# Patient Record
Sex: Female | Born: 1955 | Race: White | Hispanic: No | Smoking: Former smoker
Health system: Southern US, Community
[De-identification: ages and names within clinical notes are randomized; demographics above are authoritative.]

## PROBLEM LIST (undated history)

## (undated) DIAGNOSIS — M797 Fibromyalgia: Secondary | ICD-10-CM

## (undated) DIAGNOSIS — K219 Gastro-esophageal reflux disease without esophagitis: Secondary | ICD-10-CM

## (undated) DIAGNOSIS — M1990 Unspecified osteoarthritis, unspecified site: Secondary | ICD-10-CM

## (undated) DIAGNOSIS — I671 Cerebral aneurysm, nonruptured: Secondary | ICD-10-CM

## (undated) DIAGNOSIS — F411 Generalized anxiety disorder: Secondary | ICD-10-CM

## (undated) DIAGNOSIS — F329 Major depressive disorder, single episode, unspecified: Secondary | ICD-10-CM

## (undated) DIAGNOSIS — E1122 Type 2 diabetes mellitus with diabetic chronic kidney disease: Secondary | ICD-10-CM

## (undated) DIAGNOSIS — I10 Essential (primary) hypertension: Secondary | ICD-10-CM

## (undated) DIAGNOSIS — F32A Depression, unspecified: Secondary | ICD-10-CM

## (undated) DIAGNOSIS — Z86010 Personal history of colonic polyps: Secondary | ICD-10-CM

## (undated) DIAGNOSIS — H548 Legal blindness, as defined in USA: Secondary | ICD-10-CM

## (undated) DIAGNOSIS — G43909 Migraine, unspecified, not intractable, without status migrainosus: Secondary | ICD-10-CM

## (undated) DIAGNOSIS — I471 Supraventricular tachycardia: Secondary | ICD-10-CM

## (undated) DIAGNOSIS — I4719 Other supraventricular tachycardia: Secondary | ICD-10-CM

## (undated) DIAGNOSIS — R011 Cardiac murmur, unspecified: Secondary | ICD-10-CM

## (undated) DIAGNOSIS — K589 Irritable bowel syndrome without diarrhea: Secondary | ICD-10-CM

## (undated) DIAGNOSIS — J45909 Unspecified asthma, uncomplicated: Secondary | ICD-10-CM

## (undated) DIAGNOSIS — E119 Type 2 diabetes mellitus without complications: Secondary | ICD-10-CM

## (undated) DIAGNOSIS — R430 Anosmia: Secondary | ICD-10-CM

## (undated) DIAGNOSIS — Z87891 Personal history of nicotine dependence: Secondary | ICD-10-CM

## (undated) DIAGNOSIS — N189 Chronic kidney disease, unspecified: Secondary | ICD-10-CM

## (undated) DIAGNOSIS — F419 Anxiety disorder, unspecified: Secondary | ICD-10-CM

## (undated) DIAGNOSIS — Z8619 Personal history of other infectious and parasitic diseases: Secondary | ICD-10-CM

## (undated) HISTORY — DX: Cerebral aneurysm, nonruptured: I67.1

## (undated) HISTORY — DX: Type 2 diabetes mellitus without complications: E11.9

## (undated) HISTORY — DX: Supraventricular tachycardia: I47.1

## (undated) HISTORY — DX: Chronic kidney disease, unspecified: N18.9

## (undated) HISTORY — DX: Personal history of colonic polyps: Z86.010

## (undated) HISTORY — DX: Anosmia: R43.0

## (undated) HISTORY — DX: Gastro-esophageal reflux disease without esophagitis: K21.9

## (undated) HISTORY — DX: Irritable bowel syndrome without diarrhea: K58.9

## (undated) HISTORY — DX: Personal history of other infectious and parasitic diseases: Z86.19

## (undated) HISTORY — DX: Legal blindness, as defined in USA: H54.8

## (undated) HISTORY — PX: CRANIOTOMY: SHX93

## (undated) HISTORY — DX: Anxiety disorder, unspecified: F41.9

## (undated) HISTORY — PX: EYE SURGERY: SHX253

## (undated) HISTORY — DX: Fibromyalgia: M79.7

## (undated) HISTORY — PX: TUBAL LIGATION: SHX77

## (undated) HISTORY — DX: Unspecified asthma, uncomplicated: J45.909

## (undated) HISTORY — DX: Migraine, unspecified, not intractable, without status migrainosus: G43.909

## (undated) HISTORY — DX: Major depressive disorder, single episode, unspecified: F32.9

## (undated) HISTORY — DX: Essential (primary) hypertension: I10

## (undated) HISTORY — PX: MENISCUS REPAIR: SHX5179

## (undated) HISTORY — DX: Personal history of nicotine dependence: Z87.891

## (undated) HISTORY — DX: Unspecified osteoarthritis, unspecified site: M19.90

## (undated) HISTORY — DX: Other supraventricular tachycardia: I47.19

## (undated) HISTORY — DX: Cardiac murmur, unspecified: R01.1

## (undated) HISTORY — DX: Generalized anxiety disorder: F41.1

## (undated) HISTORY — DX: Depression, unspecified: F32.A

---
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
## 2002-11-19 DIAGNOSIS — I6782 Cerebral ischemia: Secondary | ICD-10-CM | POA: Insufficient documentation

## 2002-11-19 HISTORY — DX: Cerebral ischemia: I67.82

## 2002-11-19 HISTORY — PX: CEREBRAL ANEURYSM REPAIR: SHX164

## 2003-09-01 DIAGNOSIS — H548 Legal blindness, as defined in USA: Secondary | ICD-10-CM | POA: Insufficient documentation

## 2010-11-19 HISTORY — PX: ABDOMINAL HYSTERECTOMY: SHX81

## 2013-11-19 HISTORY — PX: CHOLECYSTECTOMY: SHX55

## 2018-06-12 ENCOUNTER — Encounter: Payer: Self-pay | Admitting: Family Medicine

## 2018-07-18 ENCOUNTER — Other Ambulatory Visit (INDEPENDENT_AMBULATORY_CARE_PROVIDER_SITE_OTHER): Payer: Medicare PPO

## 2018-07-18 ENCOUNTER — Encounter: Payer: Self-pay | Admitting: Nurse Practitioner

## 2018-07-18 ENCOUNTER — Ambulatory Visit (INDEPENDENT_AMBULATORY_CARE_PROVIDER_SITE_OTHER): Payer: Medicare PPO | Admitting: Nurse Practitioner

## 2018-07-18 VITALS — BP 138/78 | HR 74 | Ht 62.0 in | Wt 183.0 lb

## 2018-07-18 DIAGNOSIS — E1169 Type 2 diabetes mellitus with other specified complication: Secondary | ICD-10-CM

## 2018-07-18 DIAGNOSIS — E7800 Pure hypercholesterolemia, unspecified: Secondary | ICD-10-CM

## 2018-07-18 DIAGNOSIS — I671 Cerebral aneurysm, nonruptured: Secondary | ICD-10-CM

## 2018-07-18 DIAGNOSIS — Z23 Encounter for immunization: Secondary | ICD-10-CM | POA: Diagnosis not present

## 2018-07-18 DIAGNOSIS — G4733 Obstructive sleep apnea (adult) (pediatric): Secondary | ICD-10-CM | POA: Insufficient documentation

## 2018-07-18 DIAGNOSIS — G4731 Primary central sleep apnea: Secondary | ICD-10-CM | POA: Insufficient documentation

## 2018-07-18 DIAGNOSIS — A0472 Enterocolitis due to Clostridium difficile, not specified as recurrent: Secondary | ICD-10-CM

## 2018-07-18 DIAGNOSIS — E1142 Type 2 diabetes mellitus with diabetic polyneuropathy: Secondary | ICD-10-CM

## 2018-07-18 DIAGNOSIS — E118 Type 2 diabetes mellitus with unspecified complications: Secondary | ICD-10-CM | POA: Diagnosis not present

## 2018-07-18 DIAGNOSIS — K589 Irritable bowel syndrome without diarrhea: Secondary | ICD-10-CM | POA: Insufficient documentation

## 2018-07-18 DIAGNOSIS — F329 Major depressive disorder, single episode, unspecified: Secondary | ICD-10-CM

## 2018-07-18 DIAGNOSIS — F32A Depression, unspecified: Secondary | ICD-10-CM

## 2018-07-18 DIAGNOSIS — N289 Disorder of kidney and ureter, unspecified: Secondary | ICD-10-CM | POA: Diagnosis not present

## 2018-07-18 DIAGNOSIS — I10 Essential (primary) hypertension: Secondary | ICD-10-CM

## 2018-07-18 DIAGNOSIS — I152 Hypertension secondary to endocrine disorders: Secondary | ICD-10-CM | POA: Insufficient documentation

## 2018-07-18 DIAGNOSIS — E1159 Type 2 diabetes mellitus with other circulatory complications: Secondary | ICD-10-CM | POA: Insufficient documentation

## 2018-07-18 DIAGNOSIS — K219 Gastro-esophageal reflux disease without esophagitis: Secondary | ICD-10-CM | POA: Diagnosis not present

## 2018-07-18 DIAGNOSIS — M797 Fibromyalgia: Secondary | ICD-10-CM

## 2018-07-18 DIAGNOSIS — I341 Nonrheumatic mitral (valve) prolapse: Secondary | ICD-10-CM | POA: Insufficient documentation

## 2018-07-18 DIAGNOSIS — F39 Unspecified mood [affective] disorder: Secondary | ICD-10-CM | POA: Insufficient documentation

## 2018-07-18 DIAGNOSIS — F419 Anxiety disorder, unspecified: Secondary | ICD-10-CM

## 2018-07-18 DIAGNOSIS — Z794 Long term (current) use of insulin: Secondary | ICD-10-CM | POA: Insufficient documentation

## 2018-07-18 HISTORY — DX: Obstructive sleep apnea (adult) (pediatric): G47.33

## 2018-07-18 HISTORY — DX: Type 2 diabetes mellitus with other specified complication: E11.69

## 2018-07-18 HISTORY — DX: Enterocolitis due to Clostridium difficile, not specified as recurrent: A04.72

## 2018-07-18 HISTORY — DX: Type 2 diabetes mellitus with other circulatory complications: E11.59

## 2018-07-18 HISTORY — DX: Long term (current) use of insulin: E11.42

## 2018-07-18 HISTORY — DX: Hypertension secondary to endocrine disorders: I15.2

## 2018-07-18 HISTORY — DX: Cerebral aneurysm, nonruptured: I67.1

## 2018-07-18 LAB — CBC
HEMATOCRIT: 42.3 % (ref 36.0–46.0)
Hemoglobin: 14.5 g/dL (ref 12.0–15.0)
MCHC: 34.2 g/dL (ref 30.0–36.0)
MCV: 83.1 fl (ref 78.0–100.0)
Platelets: 204 10*3/uL (ref 150.0–400.0)
RBC: 5.09 Mil/uL (ref 3.87–5.11)
RDW: 15.1 % (ref 11.5–15.5)
WBC: 7.4 10*3/uL (ref 4.0–10.5)

## 2018-07-18 LAB — LIPID PANEL
CHOLESTEROL: 141 mg/dL (ref 0–200)
HDL: 41.6 mg/dL (ref >39.00)
LDL CALC: 61 mg/dL (ref 0–99)
NonHDL: 99.08
Total CHOL/HDL Ratio: 3
Triglycerides: 191 mg/dL — ABNORMAL HIGH (ref 0.0–149.0)
VLDL: 38.2 mg/dL (ref 0.0–40.0)

## 2018-07-18 LAB — COMPREHENSIVE METABOLIC PANEL
ALBUMIN: 4.7 g/dL (ref 3.5–5.2)
ALT: 45 U/L — ABNORMAL HIGH (ref 0–35)
AST: 29 U/L (ref 0–37)
Alkaline Phosphatase: 103 U/L (ref 39–117)
BUN: 20 mg/dL (ref 6–23)
CO2: 27 mEq/L (ref 19–32)
Calcium: 10.1 mg/dL (ref 8.4–10.5)
Chloride: 101 mEq/L (ref 96–112)
Creatinine, Ser: 1.3 mg/dL — ABNORMAL HIGH (ref 0.40–1.20)
GFR: 44.28 mL/min — ABNORMAL LOW (ref >60.00)
GLUCOSE: 299 mg/dL — AB (ref 70–99)
POTASSIUM: 3.7 meq/L (ref 3.5–5.1)
SODIUM: 139 meq/L (ref 135–145)
Total Bilirubin: 0.8 mg/dL (ref 0.2–1.2)
Total Protein: 7.9 g/dL (ref 6.0–8.3)

## 2018-07-18 LAB — MAGNESIUM: Magnesium: 1.7 mg/dL (ref 1.5–2.5)

## 2018-07-18 LAB — HEMOGLOBIN A1C: Hgb A1c MFr Bld: 8.5 % — ABNORMAL HIGH (ref 4.6–6.5)

## 2018-07-18 NOTE — Assessment & Plan Note (Signed)
She says she was treated for FM by prior neurologist, I have placed referral to neurology today for F/U of brain aneursym as well - Ambulatory referral to Neurology

## 2018-07-18 NOTE — Assessment & Plan Note (Signed)
Continue current meds Update labs F/U with further recommendations pending lab results - Comprehensive metabolic panel; Future - Hemoglobin A1c; Future

## 2018-07-18 NOTE — Assessment & Plan Note (Signed)
Stable Continue current meds Update labs - CBC; Future - Comprehensive metabolic panel; Future - Lipid panel; Future

## 2018-07-18 NOTE — Assessment & Plan Note (Signed)
>>  ASSESSMENT AND PLAN FOR FIBROMYALGIA WRITTEN ON 07/18/2018  9:48 AM BY SHAMBLEY, ASHLEIGH N, NP  She says she was treated for FM by prior neurologist, I have placed referral to neurology today for F/U of brain aneursym as well - Ambulatory referral to Neurology

## 2018-07-18 NOTE — Assessment & Plan Note (Signed)
Stable Continue pantoprazole Update labs - Magnesium; Future

## 2018-07-18 NOTE — Assessment & Plan Note (Addendum)
We discussed long term risk of benzodiazepine use including addiction, memory loss, falls and she would like to continue ativan PRN We did discuss that SSRI would be more appropriate as a daily treatment,she has not wanted to try SSRI up to this point Continue ativan PRN only, no more than 2-3 times per week as she is currently taking F/U for new or worsening symptoms

## 2018-07-18 NOTE — Patient Instructions (Addendum)
Please head downstairs for lab work. If any of your test results are critically abnormal, you will be contacted right away. Otherwise, I will contact you within a week about your test results and follow up recommendations  I will plan to see you back in about 6 months for routine follow up.  It was nice to meet you. Thanks for letting me take care of you.

## 2018-07-18 NOTE — Assessment & Plan Note (Signed)
Continue atorvastatin Update labs - CBC; Future - Comprehensive metabolic panel; Future - Lipid panel; Future

## 2018-07-18 NOTE — Assessment & Plan Note (Signed)
Referral to neurology for routine follow up - Ambulatory referral to Neurology

## 2018-07-18 NOTE — Progress Notes (Signed)
Name: Cassandra Reid   MRN: [id number]    DOB: 1956-07-15   Date:07/18/2018       Progress Note  Subjective  Chief Complaint Establish care  HPI Ms Cassandra Reid is here today to establish care as a new patient to our practice, just moving to [place] from [place] to be closer to children, very pleasant 62 yo. Aside from primary care, she has routinely followed with Cardiology for mitral valve prolapse and hypertension, Gastroenterology- GERD, IBS, Pulmonology-OSA, Endocrinology-Diabetes mellitus, Neurology- brain aneurysm and fibromyalgia. And nephrology for decreased renal function in [place]. She tells me that she would prefer not to return to so many specialists here as she feels overall okay, but would like to re-establish with neurology for routine follow up of brain aneurysm with coiling and nephrology for decreased kidney function, as she was told her kidney function dropped dramatically in the past year. She does c/o chronic body aches and pain, which she has been told are related to her fibromyalgia diagnosis. She is not on medication for this, and hesitates to take any new medications that are not neccessary. We will review her daily medications during the visit today and update labs.  Hypertension -maintained on amlodipine 10, metoprolol tartrate 25 BID, Lasix 40 daily. She is also on daily Potassium 20mEq for hypokalemia in the past. Reports daily medication compliance without noted adverse medication effects. Reports she occasionally checks BP readings at home with normal readings No syncope, dizziness, shortness of breath, edema.  BP Readings from Last 3 Encounters:  07/18/18 138/78   Cholesterol- maintained on atorvastatin 80 daily Reports daily medication compliance without noted adverse medication effects.  No results found for: CHOL, HDL, LDLCALC, LDLDIRECT, TRIG, CHOLHDL  Diabetes- maintained on glipizide 5 daily, metformin 500 BID, novolin 70/30-50 units BID Reports daily medication  compliance without noted adverse medication effects. Reports she checks blood sugars daily, no episodes of hypoglycemia or readings <100, some occasional readings up to 200. No tremor, diaphoresis, polyuria, polydipsia, polyphagia.  No results found for: HGBA1C  Anxiety- maintained ativan 0.5 prn 2-3 times per week for some time now, which she takes only when she feels acutely anxious such as social situations, car rides. She was Recently prescribed zoloft 50 but has not started it yet, she is thinking of starting it soon  GERD- maintained on protonix 20 BID with good control of GERD, but does notice symptoms of heartburn, acid reflux if she misses a dose.   Past Surgical History:  Procedure Laterality Date  . ABDOMINAL HYSTERECTOMY  2012  . APPENDECTOMY  1972  . CEREBRAL ANEURYSM REPAIR    . CHOLECYSTECTOMY  2015  . MENISCUS REPAIR Bilateral     History reviewed. No pertinent family history.  Social History   Socioeconomic History  . Marital status: Married    Spouse name: Not on file  . Number of children: Not on file  . Years of education: Not on file  . Highest education level: Not on file  Occupational History  . Not on file  Social Needs  . Financial resource strain: Not on file  . Food insecurity:    Worry: Not on file    Inability: Not on file  . Transportation needs:    Medical: Not on file    Non-medical: Not on file  Tobacco Use  . Smoking status: Former Games developermoker  . Smokeless tobacco: Never Used  Substance and Sexual Activity  . Alcohol use: Never    Frequency: Never  .  Drug use: Never  . Sexual activity: Not on file  Lifestyle  . Physical activity:    Days per week: Not on file    Minutes per session: Not on file  . Stress: Not on file  Relationships  . Social connections:    Talks on phone: Not on file    Gets together: Not on file    Attends religious service: Not on file    Active member of club or organization: Not on file    Attends meetings  of clubs or organizations: Not on file    Relationship status: Not on file  . Intimate partner violence:    Fear of current or ex partner: Not on file    Emotionally abused: Not on file    Physically abused: Not on file    Forced sexual activity: Not on file  Other Topics Concern  . Not on file  Social History Narrative  . Not on file     Current Outpatient Medications:  .  ACCU-CHEK FASTCLIX LANCETS MISC, , Disp: , Rfl:  .  ACCU-CHEK GUIDE test strip, , Disp: , Rfl:  .  amLODipine (NORVASC) 10 MG tablet, daily., Disp: , Rfl:  .  atorvastatin (LIPITOR) 80 MG tablet, daily., Disp: , Rfl:  .  furosemide (LASIX) 40 MG tablet, daily., Disp: , Rfl:  .  glipiZIDE (GLUCOTROL XL) 5 MG 24 hr tablet, daily., Disp: , Rfl:  .  LORazepam (ATIVAN) 0.5 MG tablet, as needed., Disp: , Rfl:  .  metFORMIN (GLUCOPHAGE-XR) 500 MG 24 hr tablet, 2 (two) times daily., Disp: , Rfl:  .  metoprolol tartrate (LOPRESSOR) 25 MG tablet, 2 (two) times daily., Disp: , Rfl:  .  pantoprazole (PROTONIX) 20 MG tablet, 2 (two) times daily., Disp: , Rfl:  .  potassium chloride SA (K-DUR,KLOR-CON) 20 MEQ tablet, daily., Disp: , Rfl:  .  sertraline (ZOLOFT) 50 MG tablet, daily., Disp: , Rfl:   No Known Allergies   ROS See HPI  Objective  Vitals:   07/18/18 0849  BP: 138/78  Pulse: 74  SpO2: 96%  Weight: 183 lb (83 kg)  Height: 5\' 2"  (1.575 m)    Body mass index is 33.47 kg/m.  Physical Exam Vital signs reviewed. Constitutional: Patient appears well-developed and well-nourished. No distress.  HENT: Head: Normocephalic and atraumatic. Nose: Nose normal. Mouth/Throat: Oropharynx is clear and moist. No oropharyngeal exudate.  Eyes: Conjunctivae and EOM are normal. Pupils are equal, round, and reactive to light. No scleral icterus.  Neck: Normal range of motion. Neck supple. No thyromegaly. No cervical adenopathy. No carotid bruit. Cardiovascular: Normal rate, regular rhythm and normal heart sounds. No BLE  edema. Distal pulses intact. Pulmonary/Chest: Effort normal and breath sounds normal. No respiratory distress. Abdominal: Soft., no distension Neurological: She is alert and oriented to person, place, and time. No cranial nerve deficit. Coordination, balance, strength, speech  are normal. Ambulatory with cane Skin: Skin is warm and dry. No rash noted. No erythema.  Psychiatric: Patient has a normal mood and affect. behavior is normal. Judgment and thought content normal.   Assessment & Plan RTC in 6 months for routine follow up of chronic conditions, can update HM if stable  Reviewed Health Maintenance: Need for influenza vaccination- Flu Vaccine QUAD 36+ mos IM  Function kidney decreased Update labs Referral placed - Comprehensive metabolic panel; Future - Ambulatory referral to Nephrology

## 2018-07-24 ENCOUNTER — Encounter: Payer: Self-pay | Admitting: Nurse Practitioner

## 2018-08-01 ENCOUNTER — Encounter: Payer: Self-pay | Admitting: Neurology

## 2018-08-07 ENCOUNTER — Other Ambulatory Visit: Payer: Self-pay | Admitting: Nurse Practitioner

## 2018-08-07 DIAGNOSIS — E118 Type 2 diabetes mellitus with unspecified complications: Secondary | ICD-10-CM

## 2018-08-07 MED ORDER — GLIPIZIDE ER 10 MG PO TB24: 10.0000 mg | ORAL_TABLET | Freq: Every day | ORAL | 3 refills | Status: DC

## 2018-08-26 ENCOUNTER — Encounter: Payer: Self-pay | Admitting: Nurse Practitioner

## 2018-08-26 ENCOUNTER — Other Ambulatory Visit: Payer: Self-pay | Admitting: *Deleted

## 2018-08-26 NOTE — Telephone Encounter (Signed)
[place] Controlled Database Checked Last filled: No history LOV w/you: 07/18/18 Next appt w/you: None

## 2018-08-27 ENCOUNTER — Other Ambulatory Visit: Payer: Self-pay | Admitting: Nurse Practitioner

## 2018-08-27 MED ORDER — LORAZEPAM 0.5 MG PO TABS: 0.5000 mg | ORAL_TABLET | ORAL | 0 refills | Status: DC | PRN

## 2018-09-02 ENCOUNTER — Encounter: Payer: Self-pay | Admitting: Family

## 2018-09-02 ENCOUNTER — Ambulatory Visit: Payer: Medicare PPO | Admitting: Family

## 2018-09-02 VITALS — BP 140/70 | HR 66 | Temp 98.4°F | Ht 62.0 in | Wt 185.0 lb

## 2018-09-02 DIAGNOSIS — M542 Cervicalgia: Secondary | ICD-10-CM | POA: Diagnosis not present

## 2018-09-02 DIAGNOSIS — J309 Allergic rhinitis, unspecified: Secondary | ICD-10-CM | POA: Diagnosis not present

## 2018-09-02 MED ORDER — BACLOFEN 10 MG PO TABS: 10.0000 mg | ORAL_TABLET | Freq: Three times a day (TID) | ORAL | 0 refills | Status: DC

## 2018-09-02 NOTE — Progress Notes (Signed)
Cassandra Reid is a 62 y.o. female with the following history as recorded in [employer]:  Patient Active Problem List   Diagnosis Date Noted  . Mitral valve prolapse 07/18/2018  . Hypertension 07/18/2018  . GERD (gastroesophageal reflux disease) 07/18/2018  . IBS (irritable bowel syndrome) 07/18/2018  . C. difficile colitis 07/18/2018  . OSA (obstructive sleep apnea) 07/18/2018  . Diabetes (HCC) 07/18/2018  . Brain aneurysm 07/18/2018  . Fibromyalgia 07/18/2018  . Anxiety and depression 07/18/2018  . Hypercholesterolemia 07/18/2018    Current Outpatient Medications  Medication Sig Dispense Refill  . ACCU-CHEK FASTCLIX LANCETS MISC     . ACCU-CHEK GUIDE test strip     . amLODipine (NORVASC) 10 MG tablet Take 10 mg by mouth daily.     Marland Kitchen atorvastatin (LIPITOR) 80 MG tablet Take 80 mg by mouth daily.     . chlorhexidine (PERIDEX) 0.12 % solution   0  . furosemide (LASIX) 40 MG tablet Take 40 mg by mouth daily.     Marland Kitchen glipiZIDE (GLUCOTROL XL) 10 MG 24 hr tablet Take 1 tablet (10 mg total) by mouth daily. 30 tablet 3  . Insulin NPH Isophane & Regular (NOVOLIN 70/30 [place]) Inject 50 Units into the skin 2 (two) times daily.    Marland Kitchen LORazepam (ATIVAN) 0.5 MG tablet Take 1 tablet (0.5 mg total) by mouth as needed. 30 tablet 0  . metFORMIN (GLUCOPHAGE-XR) 500 MG 24 hr tablet Take 500 mg by mouth 2 (two) times daily.     . metoprolol tartrate (LOPRESSOR) 25 MG tablet Take 25 mg by mouth 2 (two) times daily.     . pantoprazole (PROTONIX) 20 MG tablet Take 20 mg by mouth 2 (two) times daily.     . potassium chloride SA (K-DUR,KLOR-CON) 20 MEQ tablet Take 20 mEq by mouth daily.     . sertraline (ZOLOFT) 50 MG tablet Take 50 mg by mouth daily.     . baclofen (LIORESAL) 10 MG tablet Take 1 tablet (10 mg total) by mouth 3 (three) times daily. 30 each 0   No current facility-administered medications for this visit.     Allergies: Patient has no known allergies.  Past Medical History:  Diagnosis Date  .  Anxiety   . Arthritis   . Asthma   . Chronic kidney disease   . Depression   . Diabetes mellitus without complication (HCC)   . Fibromyalgia   . GERD (gastroesophageal reflux disease)   . Heart murmur   . History of chicken pox   . History of colon polyps   . Hypertension   . Legally blind   . Migraines     Past Surgical History:  Procedure Laterality Date  . ABDOMINAL HYSTERECTOMY  2012  . APPENDECTOMY  1972  . CEREBRAL ANEURYSM REPAIR    . CHOLECYSTECTOMY  2015  . MENISCUS REPAIR Bilateral     Family History  Problem Relation Age of Onset  . Alcohol abuse Mother   . Diabetes Mother   . Hypertension Mother   . Kidney disease Mother   . COPD Father   . Diabetes Father   . Early death Father   . Heart disease Father   . Hyperlipidemia Father   . Hypertension Father   . Diabetes Sister   . Hypertension Sister   . Breast cancer Sister   . Cancer Maternal Grandmother   . Alcohol abuse Maternal Grandfather   . Cancer Maternal Grandfather   . Cancer Paternal Grandmother   .  Cancer Paternal Grandfather   . Diabetes Sister   . Hypertension Sister   . Kidney disease Sister     Social History   Tobacco Use  . Smoking status: Former Games developer  . Smokeless tobacco: Never Used  Substance Use Topics  . Alcohol use: Never    Frequency: Never    Subjective:  Patient started with sinus congestion last week; felt like symptoms have been improving in the past few days; feels like pain is settled into her neck/ "painful to turn neck"; has tried heat with limited benefit; does have chronic allergy issues- has Nasonex at home but not using regularly;      Objective:  Vitals:   09/02/18 0933  BP: 140/70  Pulse: 66  Temp: 98.4 F (36.9 C)  TempSrc: Oral  SpO2: 97%  Weight: 185 lb (83.9 kg)  Height: 5\' 2"  (1.575 m)    General: Well developed, well nourished, in no acute distress  Skin : Warm and dry.  Head: Normocephalic and atraumatic  Eyes: Sclera and conjunctiva  clear; pupils round and reactive to light; extraocular movements intact  Ears: External normal; canals clear; tympanic membranes normal  Oropharynx: Pink, supple. No suspicious lesions  Neck: Supple without thyromegaly, adenopathy  Lungs: Respirations unlabored; clear to auscultation bilaterally without wheeze, rales, rhonchi  CVS exam: normal rate and regular rhythm.  Neurologic: Alert and oriented; speech intact; face symmetrical; moves all extremities well; CNII-XII intact without focal deficit   Assessment:  1. Allergic rhinitis, unspecified seasonality, unspecified trigger   2. Neck pain     Plan:  1. Recommend to use her Nasonex and Mucinex regularly; reassurance that do not feel she needs an antibiotic at this time; she is in agreement. 2. Suspect muscular; lymph nodes are normal; Rx for Baclofen 10 mg tid prn; follow-up worse, no better.   No follow-ups on file.  No orders of the defined types were placed in this encounter.   Requested Prescriptions   Signed Prescriptions Disp Refills  . baclofen (LIORESAL) 10 MG tablet 30 each 0    Sig: Take 1 tablet (10 mg total) by mouth 3 (three) times daily.

## 2018-09-18 ENCOUNTER — Ambulatory Visit: Payer: Medicare PPO | Admitting: Family

## 2018-09-26 ENCOUNTER — Ambulatory Visit (INDEPENDENT_AMBULATORY_CARE_PROVIDER_SITE_OTHER): Payer: Medicare PPO | Admitting: Nurse Practitioner

## 2018-09-26 ENCOUNTER — Encounter: Payer: Self-pay | Admitting: Nurse Practitioner

## 2018-09-26 ENCOUNTER — Other Ambulatory Visit (INDEPENDENT_AMBULATORY_CARE_PROVIDER_SITE_OTHER): Payer: Medicare PPO

## 2018-09-26 VITALS — BP 140/70 | HR 67 | Temp 98.1°F | Ht 62.0 in | Wt 183.0 lb

## 2018-09-26 DIAGNOSIS — J309 Allergic rhinitis, unspecified: Secondary | ICD-10-CM

## 2018-09-26 DIAGNOSIS — Z1159 Encounter for screening for other viral diseases: Secondary | ICD-10-CM | POA: Diagnosis not present

## 2018-09-26 DIAGNOSIS — E119 Type 2 diabetes mellitus without complications: Secondary | ICD-10-CM

## 2018-09-26 DIAGNOSIS — Z9189 Other specified personal risk factors, not elsewhere classified: Secondary | ICD-10-CM

## 2018-09-26 DIAGNOSIS — Z1211 Encounter for screening for malignant neoplasm of colon: Secondary | ICD-10-CM

## 2018-09-26 DIAGNOSIS — Z114 Encounter for screening for human immunodeficiency virus [HIV]: Secondary | ICD-10-CM

## 2018-09-26 DIAGNOSIS — M797 Fibromyalgia: Secondary | ICD-10-CM | POA: Diagnosis not present

## 2018-09-26 HISTORY — DX: Allergic rhinitis, unspecified: J30.9

## 2018-09-26 LAB — COMPREHENSIVE METABOLIC PANEL
ALT: 37 U/L — AB (ref 0–35)
AST: 25 U/L (ref 0–37)
Albumin: 4.8 g/dL (ref 3.5–5.2)
Alkaline Phosphatase: 109 U/L (ref 39–117)
BILIRUBIN TOTAL: 0.5 mg/dL (ref 0.2–1.2)
BUN: 15 mg/dL (ref 6–23)
CALCIUM: 9.6 mg/dL (ref 8.4–10.5)
CO2: 22 meq/L (ref 19–32)
CREATININE: 1.2 mg/dL (ref 0.40–1.20)
Chloride: 105 mEq/L (ref 96–112)
GFR: 48.53 mL/min — ABNORMAL LOW (ref >60.00)
GLUCOSE: 233 mg/dL — AB (ref 70–99)
Potassium: 3.9 mEq/L (ref 3.5–5.1)
Sodium: 140 mEq/L (ref 135–145)
Total Protein: 7.9 g/dL (ref 6.0–8.3)

## 2018-09-26 LAB — MICROALBUMIN / CREATININE URINE RATIO
CREATININE, U: 374 mg/dL
Microalb Creat Ratio: 3.5 mg/g (ref 0.0–30.0)
Microalb, Ur: 13.1 mg/dL — ABNORMAL HIGH (ref 0.0–1.9)

## 2018-09-26 LAB — HEMOGLOBIN A1C: Hgb A1c MFr Bld: 7.9 % — ABNORMAL HIGH (ref 4.6–6.5)

## 2018-09-26 NOTE — Progress Notes (Signed)
NEUROLOGY CONSULTATION NOTE  Cassandra Reid MRN: [id number] DOB: 1956-03-05  Referring provider: Alphonse Guild, NP Primary care provider: Alphonse Guild, NP  Reason for consult:  Cerebral aneurysm  HISTORY OF PRESENT ILLNESS: Cassandra Reid is a 62 year old female with chronic kidney disease, diabetes, hypertension, fibromyalgia, and history of cerebral aneurysm status post coiling who presents for evaluation of her cerebral aneurysm.  History supplemented by her referring providers note.  She had clipping and then coiling for non-ruptured aneurysm x2 about 14 years ago.  Her last image was a couple of years ago.  She reports that they saw another aneurysm but nothing that yet required intervention.    Since the surgeries, she reports spasms on top and side of head.  They occur off and on for a couple of days, occurring every 5 days.  She denies associated nausea, vomiting, photophobia, phonophobia, visual disturbance or unilateral numbness or weakness.  She was prescribed gabapentin by her prior neurologist but she never started it until she could establish care.  Prior treatment included tizanidine which was helpful but no longer covered by insurance.  She also has fibromyalgia.  She also has significant anxiety.   CMP from 09/26/18 demonstrated GFR of 48.53.  PAST MEDICAL HISTORY: Past Medical History:  Diagnosis Date  . Anxiety   . Arthritis   . Asthma   . Chronic kidney disease   . Depression   . Diabetes mellitus without complication (HCC)   . Fibromyalgia   . GERD (gastroesophageal reflux disease)   . Heart murmur   . History of chicken pox   . History of colon polyps   . Hypertension   . Legally blind   . Migraines     PAST SURGICAL HISTORY: Past Surgical History:  Procedure Laterality Date  . ABDOMINAL HYSTERECTOMY  2012  . APPENDECTOMY  1972  . CEREBRAL ANEURYSM REPAIR    . CHOLECYSTECTOMY  2015  . MENISCUS REPAIR Bilateral     MEDICATIONS: Current  Outpatient Medications on File Prior to Visit  Medication Sig Dispense Refill  . ACCU-CHEK FASTCLIX LANCETS MISC     . ACCU-CHEK GUIDE test strip     . amLODipine (NORVASC) 10 MG tablet Take 10 mg by mouth daily.     Marland Kitchen atorvastatin (LIPITOR) 80 MG tablet Take 80 mg by mouth daily.     . baclofen (LIORESAL) 10 MG tablet Take 1 tablet (10 mg total) by mouth 3 (three) times daily. 30 each 0  . cephALEXin (KEFLEX) 500 MG capsule     . chlorhexidine (PERIDEX) 0.12 % solution   0  . furosemide (LASIX) 40 MG tablet Take 40 mg by mouth daily.     Marland Kitchen gabapentin (NEURONTIN) 300 MG capsule     . glipiZIDE (GLUCOTROL XL) 10 MG 24 hr tablet Take 1 tablet (10 mg total) by mouth daily. 30 tablet 3  . Insulin NPH Isophane & Regular (NOVOLIN 70/30 [place]) Inject 50 Units into the skin 2 (two) times daily.    Marland Kitchen LORazepam (ATIVAN) 0.5 MG tablet Take 1 tablet (0.5 mg total) by mouth as needed. 30 tablet 0  . metFORMIN (GLUCOPHAGE-XR) 500 MG 24 hr tablet Take 500 mg by mouth 2 (two) times daily.     . metoprolol tartrate (LOPRESSOR) 25 MG tablet Take 25 mg by mouth 2 (two) times daily.     . pantoprazole (PROTONIX) 20 MG tablet Take 20 mg by mouth 2 (two) times daily.     . potassium  chloride SA (K-DUR,KLOR-CON) 20 MEQ tablet Take 20 mEq by mouth daily.     . sertraline (ZOLOFT) 50 MG tablet Take 50 mg by mouth daily.      No current facility-administered medications on file prior to visit.     ALLERGIES: No Known Allergies  FAMILY HISTORY: Family History  Problem Relation Age of Onset  . Alcohol abuse Mother   . Diabetes Mother   . Hypertension Mother   . Kidney disease Mother   . COPD Father   . Diabetes Father   . Early death Father   . Heart disease Father   . Hyperlipidemia Father   . Hypertension Father   . Diabetes Sister   . Hypertension Sister   . Breast cancer Sister   . Cancer Maternal Grandmother   . Alcohol abuse Maternal Grandfather   . Cancer Maternal Grandfather   . Cancer Paternal  Grandmother   . Cancer Paternal Grandfather   . Diabetes Sister   . Hypertension Sister   . Kidney disease Sister     SOCIAL HISTORY: Social History   Socioeconomic History  . Marital status: Married    Spouse name: Not on file  . Number of children: Not on file  . Years of education: Not on file  . Highest education level: Not on file  Occupational History  . Not on file  Social Needs  . Financial resource strain: Not on file  . Food insecurity:    Worry: Not on file    Inability: Not on file  . Transportation needs:    Medical: Not on file    Non-medical: Not on file  Tobacco Use  . Smoking status: Former Games developer  . Smokeless tobacco: Never Used  Substance and Sexual Activity  . Alcohol use: Never    Frequency: Never  . Drug use: Never  . Sexual activity: Not Currently  Lifestyle  . Physical activity:    Days per week: Not on file    Minutes per session: Not on file  . Stress: Not on file  Relationships  . Social connections:    Talks on phone: Not on file    Gets together: Not on file    Attends religious service: Not on file    Active member of club or organization: Not on file    Attends meetings of clubs or organizations: Not on file    Relationship status: Not on file  . Intimate partner violence:    Fear of current or ex partner: Not on file    Emotionally abused: Not on file    Physically abused: Not on file    Forced sexual activity: Not on file  Other Topics Concern  . Not on file  Social History Narrative  . Not on file    REVIEW OF SYSTEMS: Constitutional: No fevers, chills, or sweats, no generalized fatigue, change in appetite Eyes: No visual changes, double vision, eye pain Ear, nose and throat: No hearing loss, ear pain, nasal congestion, sore throat Cardiovascular: No chest pain, palpitations Respiratory:  No shortness of breath at rest or with exertion, wheezes GastrointestinaI: No nausea, vomiting, diarrhea, abdominal pain, fecal  incontinence Genitourinary:  No dysuria, urinary retention or frequency Musculoskeletal:  Generalized pain Integumentary: No rash, pruritus, skin lesions Neurological: as above Psychiatric: anxiety Endocrine: No palpitations, fatigue, diaphoresis, mood swings, change in appetite, change in weight, increased thirst Hematologic/Lymphatic:  No purpura, petechiae. Allergic/Immunologic: no itchy/runny eyes, nasal congestion, recent allergic reactions, rashes  PHYSICAL EXAM: Blood  pressure (!) 150/70, pulse 67, height 5\' 2"  (1.575 m), weight 184 lb (83.5 kg), SpO2 97 %. General: No acute distress.  Patient appears well-groomed.   Head:  Normocephalic/atraumatic Eyes:  fundi examined but not visualized Neck: supple, no paraspinal tenderness, full range of motion Back: No paraspinal tenderness Heart: regular rate and rhythm Lungs: Clear to auscultation bilaterally. Vascular: No carotid bruits. Neurological Exam: Mental status: alert and oriented to person, place, and time, recent and remote memory intact, fund of knowledge intact, attention and concentration intact, speech fluent and not dysarthric, language intact. Cranial nerves: CN I: not tested CN II: pupils equal, round and reactive to light, visual fields intact CN III, IV, VI:  full range of motion, no nystagmus, no ptosis CN V: facial sensation intact CN VII: upper and lower face symmetric CN VIII: hearing intact CN IX, X: gag intact, uvula midline CN XI: sternocleidomastoid and trapezius muscles intact CN XII: tongue midline Bulk & Tone: normal, no fasciculations. Motor:  5/5 throughout  Sensation: temperature and vibration sensation intact. Deep Tendon Reflexes:  2+ throughout, toes downgoing.  Finger to nose testing:  Without dysmetria.  Heel to shin:  Without dysmetria.  Gait:  Antalgic gait.  Walks with cane.  Romberg negative.  IMPRESSION: 1.  Cerebral aneurysm.  It would be helpful to obtain past imaging for  comparison 2.  Episodic tension-type headache, not intractable 3.  Anxiety 4.  Fibromyalgia.  She is aware that our practice does not treat fibromyalgia  PLAN: 1.  We will check MRA of the head for follow-up evaluation of cerebral aneurysms. 2.  I told her I think it would be okay to start gabapentin 300 mg 3 times daily. 3.  I have no objection for her to start sertraline for anxiety 4.  I will obtain notes from her prior neurologist. 5.  Follow up in 5 months.  Thank you for allowing me to take part in the care of this patient.  Shon Millet, DO  CC:  Alphonse Guild, NP

## 2018-09-26 NOTE — Patient Instructions (Signed)
Please start neurontin to see if this helps your pain, keep planned follow up with neurology on Monday.  Labs downstairs today.  I will plan to see you back in 6 months for regular follow up!

## 2018-09-26 NOTE — Assessment & Plan Note (Signed)
Continue current meds Update labs F/U with further recommendations pending lab results Will plan to see her back in 6 months if labs are stable - Hemoglobin A1c; Future - Comprehensive metabolic panel; Future - Microalbumin / creatinine urine ratio; Future

## 2018-09-26 NOTE — Assessment & Plan Note (Signed)
>>  ASSESSMENT AND PLAN FOR FIBROMYALGIA WRITTEN ON 09/26/2018  9:16 AM BY SHAMBLEY, ASHLEIGH N, NP  We discussed treatment of FM, Neurontin is an appropriate choice for treatment, she will start neurontin to see if this helps her symptoms  Continue with scheduled neurology follow up

## 2018-09-26 NOTE — Progress Notes (Signed)
Cassandra Reid is a 62 y.o. female with the following history as recorded in EpicCare:  Patient Active Problem List   Diagnosis Date Noted  . Mitral valve prolapse 07/18/2018  . Hypertension 07/18/2018  . GERD (gastroesophageal reflux disease) 07/18/2018  . IBS (irritable bowel syndrome) 07/18/2018  . C. difficile colitis 07/18/2018  . OSA (obstructive sleep apnea) 07/18/2018  . Diabetes (HCC) 07/18/2018  . Brain aneurysm 07/18/2018  . Fibromyalgia 07/18/2018  . Anxiety and depression 07/18/2018  . Hypercholesterolemia 07/18/2018    Current Outpatient Medications  Medication Sig Dispense Refill  . ACCU-CHEK FASTCLIX LANCETS MISC     . ACCU-CHEK GUIDE test strip     . amLODipine (NORVASC) 10 MG tablet Take 10 mg by mouth daily.     Marland Kitchen atorvastatin (LIPITOR) 80 MG tablet Take 80 mg by mouth daily.     . baclofen (LIORESAL) 10 MG tablet Take 1 tablet (10 mg total) by mouth 3 (three) times daily. 30 each 0  . cephALEXin (KEFLEX) 500 MG capsule     . chlorhexidine (PERIDEX) 0.12 % solution   0  . furosemide (LASIX) 40 MG tablet Take 40 mg by mouth daily.     Marland Kitchen glipiZIDE (GLUCOTROL XL) 10 MG 24 hr tablet Take 1 tablet (10 mg total) by mouth daily. 30 tablet 3  . Insulin NPH Isophane & Regular (NOVOLIN 70/30 [place]) Inject 50 Units into the skin 2 (two) times daily.    Marland Kitchen LORazepam (ATIVAN) 0.5 MG tablet Take 1 tablet (0.5 mg total) by mouth as needed. 30 tablet 0  . metFORMIN (GLUCOPHAGE-XR) 500 MG 24 hr tablet Take 500 mg by mouth 2 (two) times daily.     . metoprolol tartrate (LOPRESSOR) 25 MG tablet Take 25 mg by mouth 2 (two) times daily.     . pantoprazole (PROTONIX) 20 MG tablet Take 20 mg by mouth 2 (two) times daily.     . potassium chloride SA (K-DUR,KLOR-CON) 20 MEQ tablet Take 20 mEq by mouth daily.     . sertraline (ZOLOFT) 50 MG tablet Take 50 mg by mouth daily.     Marland Kitchen gabapentin (NEURONTIN) 300 MG capsule      No current facility-administered medications for this visit.      Allergies: Patient has no known allergies.  Past Medical History:  Diagnosis Date  . Anxiety   . Arthritis   . Asthma   . Chronic kidney disease   . Depression   . Diabetes mellitus without complication (HCC)   . Fibromyalgia   . GERD (gastroesophageal reflux disease)   . Heart murmur   . History of chicken pox   . History of colon polyps   . Hypertension   . Legally blind   . Migraines     Past Surgical History:  Procedure Laterality Date  . ABDOMINAL HYSTERECTOMY  2012  . APPENDECTOMY  1972  . CEREBRAL ANEURYSM REPAIR    . CHOLECYSTECTOMY  2015  . MENISCUS REPAIR Bilateral     Family History  Problem Relation Age of Onset  . Alcohol abuse Mother   . Diabetes Mother   . Hypertension Mother   . Kidney disease Mother   . COPD Father   . Diabetes Father   . Early death Father   . Heart disease Father   . Hyperlipidemia Father   . Hypertension Father   . Diabetes Sister   . Hypertension Sister   . Breast cancer Sister   . Cancer Maternal Grandmother   .  Alcohol abuse Maternal Grandfather   . Cancer Maternal Grandfather   . Cancer Paternal Grandmother   . Cancer Paternal Grandfather   . Diabetes Sister   . Hypertension Sister   . Kidney disease Sister     Social History   Tobacco Use  . Smoking status: Former Games developer  . Smokeless tobacco: Never Used  Substance Use Topics  . Alcohol use: Never    Frequency: Never     Subjective:  Cassandra Reid is here today for follow up of diabetes and abnormal liver function, would also like to discuss complaint of pain in her ribs, ears and neck. At her last OV with me on 07/18/18, her A1c was elevated, she had bene maintained on metformin 500 BID, novolin 70/30 50 units BID, and she was instructed to continue these meds and increase her glipizide to 10 daily for the elevated A1c.  Her ALT was also noted to be slightly elevated on her 8/30 labs, will recheck today. Today, she tells me she Has increased glipizde to 10 daily as  instructed, she has noticed improved glucose readings with the adjustment, 120s-160s in the am fasting since the medication increase, tolerating the new dosage well. She is complaining of pain today- multiple areas- ribs, ears, neck. She was seen by another provider on 09/02/18 and started on nasonex/mucinex for allergic rhinitis and given baclofen Rx for neck pain. The baclofen does seem to help the neck pain when she takes it, and she has been using mucinex/nasonex as instructed along with OTC antihistamine for allergy symptoms but continues to have sinus pain and pressure, ear pressure, facial pressure, seems her symptoms are getting worse. She also c/o "sharp, shooting" pain to multiple areas of her body- neck, shoulders, ribs, back- for years-  has seen many specialists and been told this pain was related to IBS, FM, "nothing can be done." she was sent neurontin prescription by her prior neurologist, received in the mail this week, but has not started yet, was waiting to see neurologist here first- appointment scheduled with [facility] neuro on this coming Monday. She denies fevers, syncope, congestion, rhinorrhea, cough, nausea, vomiting. She is currently on keflex course for toe infection, started by an urgent care.  Lab Results  Component Value Date   HGBA1C 8.5 (H) 07/18/2018    ROS- See HPI  Objective:  Vitals:   09/26/18 0814  BP: 140/70  Pulse: 67  Temp: 98.1 F (36.7 C)  TempSrc: Oral  SpO2: 96%  Weight: 183 lb (83 kg)  Height: 5\' 2"  (1.575 m)    General: Well developed, well nourished, in no acute distress. Ambulatory with cane. Skin : Warm and dry.  Head: Normocephalic and atraumatic  Eyes: Sclera and conjunctiva clear; pupils round and reactive to light; extraocular movements intact  Ears: External normal; canals clear; tympanic membranes bulging, no erythema, light reflex noted with visible bony landmarks  Oropharynx: Pink, supple. No suspicious lesions  Neck: Supple without  thyromegaly, adenopathy  Lungs: Respirations unlabored; clear to auscultation bilaterally without wheeze, rales, rhonchi  CVS exam: normal rate and regular rhythm, S1 and S2 normal.  Musculoskeletal: No deformities; no active joint inflammation  Extremities: No edema, cyanosis Vessels: Symmetric bilaterally  Neurologic: Alert and oriented; speech intact; face symmetrical; moves all extremities well; CNII-XII intact without focal deficit  Psychiatric: Normal mood and affect.  Physical Exam   Assessment:  1. Fibromyalgia   2. Type 2 diabetes mellitus without complication, without long-term current use of insulin (HCC)  3. Encounter for hepatitis C virus screening test for high risk patient   4. Screening for HIV (human immunodeficiency virus)   5. Screening for colon cancer     Plan:   Return in about 6 months (around 03/27/2019) for routine follow up.  Orders Placed This Encounter  Procedures  . Hemoglobin A1c    Standing Status:   Future    Standing Expiration Date:   09/27/2019  . Comprehensive metabolic panel    Standing Status:   Future    Standing Expiration Date:   09/27/2019  . Hepatitis C antibody    Standing Status:   Future    Standing Expiration Date:   10/26/2018  . Microalbumin / creatinine urine ratio    Standing Status:   Future    Standing Expiration Date:   09/27/2019  . HIV Antibody (routine testing w rflx)    Standing Status:   Future    Standing Expiration Date:   10/26/2018  . Ambulatory referral to Gastroenterology    Referral Priority:   Routine    Referral Type:   Consultation    Referral Reason:   Specialty Services Required    Number of Visits Requested:   1    Requested Prescriptions    No prescriptions requested or ordered in this encounter     Reviewed health maintenance: Encounter for hepatitis C virus screening test for high risk patient- Hepatitis C antibody; Future Screening for HIV (human immunodeficiency virus)- HIV Antibody (routine  testing w rflx); Future Screening for colon cancer- Ambulatory referral to Gastroenterology

## 2018-09-26 NOTE — Assessment & Plan Note (Signed)
Currently on course of keflex for foot infection, no other medications started today Home management, red flags and return precautions including when to seek immediate care discussed and printed on AVS We discussed referral to ENT for further evaluation, she declines referral today but will let me know if she is still experiencing symptoms after keflex course and I will place referral at that time

## 2018-09-26 NOTE — Assessment & Plan Note (Signed)
We discussed treatment of FM, Neurontin is an appropriate choice for treatment, she will start neurontin to see if this helps her symptoms  Continue with scheduled neurology follow up

## 2018-09-27 LAB — HEPATITIS C ANTIBODY
Hepatitis C Ab: NONREACTIVE
SIGNAL TO CUT-OFF: 0.01 (ref <1.00)

## 2018-09-27 LAB — HIV ANTIBODY (ROUTINE TESTING W REFLEX): HIV: NONREACTIVE

## 2018-09-29 ENCOUNTER — Ambulatory Visit: Payer: Medicare PPO | Admitting: Neurology

## 2018-09-29 ENCOUNTER — Encounter: Payer: Self-pay | Admitting: Neurology

## 2018-09-29 ENCOUNTER — Encounter: Payer: Self-pay | Admitting: Nurse Practitioner

## 2018-09-29 VITALS — BP 150/70 | HR 67 | Ht 62.0 in | Wt 184.0 lb

## 2018-09-29 DIAGNOSIS — I671 Cerebral aneurysm, nonruptured: Secondary | ICD-10-CM | POA: Diagnosis not present

## 2018-09-29 DIAGNOSIS — G44219 Episodic tension-type headache, not intractable: Secondary | ICD-10-CM

## 2018-09-29 NOTE — Patient Instructions (Addendum)
1.  We will check MRA of head to follow up on aneurysm 2.  Start gabapentin 300mg  three times daily 3.  It is okay to start sertraline 4.  Follow up in 5 months.  We have sent a referral to [facility] for your MRA and they will call you directly to schedule your appt. They are located at [address] [facility]. If you need to contact them directly please call [phone].

## 2018-10-01 ENCOUNTER — Other Ambulatory Visit: Payer: Self-pay | Admitting: Nurse Practitioner

## 2018-10-01 DIAGNOSIS — J309 Allergic rhinitis, unspecified: Secondary | ICD-10-CM

## 2018-10-01 DIAGNOSIS — J329 Chronic sinusitis, unspecified: Secondary | ICD-10-CM

## 2018-10-01 DIAGNOSIS — E119 Type 2 diabetes mellitus without complications: Secondary | ICD-10-CM

## 2018-10-14 ENCOUNTER — Other Ambulatory Visit: Payer: Self-pay | Admitting: Nurse Practitioner

## 2018-10-14 DIAGNOSIS — E118 Type 2 diabetes mellitus with unspecified complications: Secondary | ICD-10-CM

## 2018-10-22 ENCOUNTER — Other Ambulatory Visit: Payer: Self-pay

## 2018-10-22 MED ORDER — GABAPENTIN 300 MG PO CAPS: 300.0000 mg | ORAL_CAPSULE | Freq: Three times a day (TID) | ORAL | 3 refills | Status: DC

## 2018-11-03 ENCOUNTER — Other Ambulatory Visit: Payer: Self-pay | Admitting: Nurse Practitioner

## 2018-11-03 DIAGNOSIS — E118 Type 2 diabetes mellitus with unspecified complications: Secondary | ICD-10-CM

## 2018-11-04 ENCOUNTER — Telehealth: Payer: Self-pay | Admitting: Neurology

## 2018-11-04 NOTE — Telephone Encounter (Signed)
Called and spoke with Pt. [place] MRI can be scheduled. May have to schedule at [facility].

## 2018-11-04 NOTE — Telephone Encounter (Signed)
Patient has not heard anything from the MRA place to get her schedule and she wants to check in the status please call something about paper work

## 2018-11-06 ENCOUNTER — Telehealth: Payer: Self-pay | Admitting: Gastroenterology

## 2018-11-06 NOTE — Telephone Encounter (Signed)
Recv records from last colon will place on  dod desk of 10/03/2018 AM.

## 2018-11-07 NOTE — Telephone Encounter (Signed)
Still have not rcvd

## 2018-11-17 ENCOUNTER — Telehealth: Payer: Self-pay | Admitting: Neurology

## 2018-11-17 NOTE — Telephone Encounter (Signed)
Patient wants to talk to someone about getting her MRI or MRA schedule

## 2018-11-24 ENCOUNTER — Encounter: Payer: Self-pay | Admitting: Nurse Practitioner

## 2018-11-24 ENCOUNTER — Encounter: Payer: Self-pay | Admitting: Internal Medicine

## 2018-11-24 ENCOUNTER — Ambulatory Visit: Payer: Medicare PPO | Admitting: Internal Medicine

## 2018-11-24 VITALS — BP 146/72 | HR 99 | Ht 62.0 in | Wt 190.6 lb

## 2018-11-24 DIAGNOSIS — N183 Chronic kidney disease, stage 3 unspecified: Secondary | ICD-10-CM

## 2018-11-24 DIAGNOSIS — E1122 Type 2 diabetes mellitus with diabetic chronic kidney disease: Secondary | ICD-10-CM

## 2018-11-24 DIAGNOSIS — E1142 Type 2 diabetes mellitus with diabetic polyneuropathy: Secondary | ICD-10-CM | POA: Diagnosis not present

## 2018-11-24 DIAGNOSIS — Z794 Long term (current) use of insulin: Secondary | ICD-10-CM

## 2018-11-24 NOTE — Telephone Encounter (Signed)
Called and spoke with Pt. I explained to her that the information needed concerning the clips and coils was required prior to being able to schedule the imaging. I advised her the release she signed came back indicating they did not have the information there, that it was [facility]. Pt thinks she has copies of surgical reports and will send via Mychart.

## 2018-11-24 NOTE — Progress Notes (Signed)
Name: Cassandra Reid  MRN/ DOB: [id number], 1955/12/08   Age/ Sex: 63 y.o., female    PCP: Evaristo Bury, NP   Reason for Endocrinology Evaluation: Type 2 Diabetes Mellitus     Date of Initial Endocrinology Visit: 11/24/2018     PATIENT IDENTIFIER: Cassandra Reid is a 63 y.o. female with a past medical history of DM, mitral valve prolapse, dyslipidemia, OSA , brain aneurysm and HTN . The patient presented for initial endocrinology clinic visit on 11/24/2018 for consultative assistance with her diabetes management.    HPI: Cassandra [place] moved from [place] to be close to her youngest  daughter   Diagnosed with T2DM ~ 17 yrs ago Prior Medications tried/Intolerance: Actos- does not recall intolerance. Followed by Metformin, then Glipizide then Novolin.  Has tried trulicity, but due to insurance coverage this was stopped. Currently checking blood sugars 3 x / day,  before breakfast and during the day  Hypoglycemia episodes :   no            Hemoglobin A1c has ranged from  7.9% in 09/2018, peaking at 8.5% in 06/2018 Patient required assistance for hypoglycemia: no Patient has required hospitalization within the last 1 year from hyper or hypoglycemia: no  In terms of diet, the patient snacks all day,  No meal pattern, avoids sugar- sweetened beverages.    HOME DIABETES REGIMEN: Novolin 70/30 50 units BID (with breakfast and 8 pm )  Metformin 500 mg BID Glipizide 10 mg daily    Statin: Yes ACE-I/ARB: No Prior Diabetic Education: Yes   METER DOWNLOAD SUMMARY: Date range evaluated: 10/26/18-11/24/18 Fingerstick Blood Glucose Tests = 86 Average Number Tests/Day = 2.9 Overall Mean FS Glucose = 219 Standard Deviation = 55.5  BG Ranges: Low = 78 High = 370   Hypoglycemic Events/30 Days: BG < 50 = 0 Episodes of symptomatic severe hypoglycemia = 0   DIABETIC COMPLICATIONS: Microvascular complications:    Neuropathy , CKD III, legally blind   Last eye exam: Completed  2018  Macrovascular complications:    Denies: CAD, PVD, CVA   PAST HISTORY: Past Medical History:  Past Medical History:  Diagnosis Date  . Anxiety   . Arthritis   . Asthma   . Chronic kidney disease   . Depression   . Diabetes mellitus without complication (HCC)   . Fibromyalgia   . GERD (gastroesophageal reflux disease)   . Heart murmur   . History of chicken pox   . History of colon polyps   . Hypertension   . Legally blind   . Migraines    Past Surgical History:  Past Surgical History:  Procedure Laterality Date  . ABDOMINAL HYSTERECTOMY  2012  . APPENDECTOMY  1972  . CEREBRAL ANEURYSM REPAIR    . CHOLECYSTECTOMY  2015  . MENISCUS REPAIR Bilateral       Social History:  reports that she has quit smoking. She has never used smokeless tobacco. She reports that she does not drink alcohol or use drugs. Family History:  Family History  Problem Relation Age of Onset  . Alcohol abuse Mother   . Diabetes Mother   . Hypertension Mother   . Kidney disease Mother   . COPD Father   . Diabetes Father   . Early death Father   . Heart disease Father   . Hyperlipidemia Father   . Hypertension Father   . Diabetes Sister   . Hypertension Sister   . Breast cancer Sister   .  Cancer Maternal Grandmother   . Alcohol abuse Maternal Grandfather   . Cancer Maternal Grandfather   . Cancer Paternal Grandmother   . Cancer Paternal Grandfather   . Diabetes Sister   . Hypertension Sister   . Kidney disease Sister      HOME MEDICATIONS: Allergies as of 11/24/2018   No Known Allergies     Medication List       Accurate as of November 24, 2018  9:13 AM. Always use your most recent med list.        ACCU-CHEK FASTCLIX LANCETS Misc   ACCU-CHEK GUIDE test strip Generic drug:  glucose blood   amLODipine 10 MG tablet Commonly known as:  NORVASC Take 10 mg by mouth daily.   atorvastatin 80 MG tablet Commonly known as:  LIPITOR Take 80 mg by mouth daily.   baclofen  10 MG tablet Commonly known as:  LIORESAL Take 1 tablet (10 mg total) by mouth 3 (three) times daily.   cephALEXin 500 MG capsule Commonly known as:  KEFLEX   chlorhexidine 0.12 % solution Commonly known as:  PERIDEX   furosemide 40 MG tablet Commonly known as:  LASIX Take 40 mg by mouth daily.   gabapentin 300 MG capsule Commonly known as:  NEURONTIN 3 (three) times daily.   gabapentin 300 MG capsule Commonly known as:  NEURONTIN Take 1 capsule (300 mg total) by mouth 3 (three) times daily.   glipiZIDE 10 MG 24 hr tablet Commonly known as:  GLUCOTROL XL TAKE 1 TABLET EVERY DAY   LORazepam 0.5 MG tablet Commonly known as:  ATIVAN Take 1 tablet (0.5 mg total) by mouth as needed.   metFORMIN 500 MG 24 hr tablet Commonly known as:  GLUCOPHAGE-XR Take 500 mg by mouth 2 (two) times daily.   metoprolol tartrate 25 MG tablet Commonly known as:  LOPRESSOR Take 25 mg by mouth 2 (two) times daily.   NOVOLIN 70/30 [place] Inject 50 Units into the skin 2 (two) times daily.   pantoprazole 20 MG tablet Commonly known as:  PROTONIX Take 20 mg by mouth 2 (two) times daily.   potassium chloride SA 20 MEQ tablet Commonly known as:  K-DUR,KLOR-CON Take 20 mEq by mouth daily.   sertraline 50 MG tablet Commonly known as:  ZOLOFT Take 50 mg by mouth daily.        ALLERGIES: No Known Allergies   REVIEW OF SYSTEMS: A comprehensive ROS was conducted with the patient and is negative except as per HPI and below:  Review of Systems  Constitutional: Negative for fever and weight loss.  HENT: Negative for congestion and sore throat.   Eyes: Positive for blurred vision. Negative for pain.  Respiratory: Negative for cough and shortness of breath.   Cardiovascular: Positive for palpitations. Negative for chest pain.  Gastrointestinal: Positive for constipation. Negative for nausea.  Genitourinary: Positive for frequency.  Neurological: Positive for tremors. Negative for tingling.   Endo/Heme/Allergies: Positive for polydipsia.  Psychiatric/Behavioral: Positive for depression. The patient is nervous/anxious.       OBJECTIVE:   VITAL SIGNS: BP (!) 146/72 (BP Location: Left Arm, Patient Position: Sitting, Cuff Size: Normal)   Pulse 99   Ht 5\' 2"  (1.575 m)   Wt 190 lb 9.6 oz (86.5 kg)   SpO2 96%   BMI 34.86 kg/m    PHYSICAL EXAM:  General: Pt appears well and is in NAD  Hydration: Well-hydrated with moist mucous membranes and good skin turgor  HEENT: Head: Unremarkable with  good dentition. Oropharynx clear without exudate.  Eyes: External eye exam normal without stare, lid lag or exophthalmos.  EOM intact.  PERRL.  Neck: General: Supple without adenopathy or carotid bruits. Thyroid: Thyroid size normal.  No goiter or nodules appreciated. No thyroid bruit.  Lungs: Clear with good BS bilat with no rales, rhonchi, or wheezes  Heart: RRR with normal S1 and S2 and no gallops; no murmurs; no rub  Abdomen: Normoactive bowel sounds, soft, nontender, without masses or organomegaly palpable  Extremities:  Lower extremities - No pretibial edema. No lesions.  Skin: Normal texture and temperature to palpation. No rash noted. No Acanthosis nigricans/skin tags. No lipohypertrophy.  Neuro: MS is good with appropriate affect, pt is alert and Ox3    DM foot exam: 11/24/2018 The skin of the feet is intact without sores or ulcerations. The pedal pulses are 2+ on right and 2+ on left. The sensation is decreased to a screening 5.07, 10 gram monofilament bilaterally   DATA REVIEWED:  Lab Results  Component Value Date   HGBA1C 7.9 (H) 09/26/2018   HGBA1C 8.5 (H) 07/18/2018   Lab Results  Component Value Date   MICROALBUR 13.1 (H) 09/26/2018   LDLCALC 61 07/18/2018   CREATININE 1.20 09/26/2018   Lab Results  Component Value Date   MICRALBCREAT 3.5 09/26/2018    Lab Results  Component Value Date   CHOL 141 07/18/2018   HDL 41.60 07/18/2018   LDLCALC 61 07/18/2018    TRIG 191.0 (H) 07/18/2018   CHOLHDL 3 07/18/2018        ASSESSMENT / PLAN / RECOMMENDATIONS:   1) Type 2 Diabetes Mellitus, Sub-Optimally Controlled, With CKD III and neuropathic complications - Most recent A1c of 7.9 %. Goal A1c < 7.0 %.  Without hypoglycemia   Plan: GENERAL: I have discussed with the patient the pathophysiology of diabetes. We went over the natural progression of the disease. We talked about both insulin resistance and insulin deficiency. We stressed the importance of lifestyle changes including diet and exercise. I explained the complications associated with diabetes including retinopathy, nephropathy, neuropathy as well as increased risk of cardiovascular disease. We went over the benefit seen with glycemic control.    I explained to the patient that diabetic patients are at higher than normal risk for amputations. The patient was informed that diabetes is the number one cause of non-traumatic amputations in [place], and controlling her diabetes is going to help slow down the progression of complications.  Discussed pharmacokinetics of basal/bolus insulin and the importance of taking prandial insulin with meals.   We also discussed avoiding sugar-sweetened beverages and snacks, when possible.   I explained to her that eating multiple meals a day and sporadic meals a day is not going to control her sugars while on insulin regimen.   She is motivated to change her lifestyle, she is going to start eating 2-3 consistent meals a day  She was advised to take Novoloin WITH meals (Breakfast and Supper), taking it without eating, will increase her risk of hypoglycemia, we discucssed hypoglycemia could be fatal.   We will stop Glipizide, as it does increase the risk of hypoglycemia in the setting of CKD and the presence of 2 types of insulin.    MEDICATIONS: - STOP GLIPIZIDE  - Continue Metformin 500 mg twice a day with meals - Decrease Novolin 70/30 to 40 units TWICE a  day with Breakfast and Supper  EDUCATION / INSTRUCTIONS:  BG monitoring instructions: Patient is instructed to  check her blood sugars 3 times a day, before meals.  Call [facility] if: BG persistently < 70 or > 300. . I reviewed the Rule of 15 for the treatment of hypoglycemia in detail with the patient. Literature supplied.   2) Diabetic complications:   Eye: She is legally blind in both eyes, she attributes this to brain sx due to aneurysm but I am not sure, she was advised to see an ophthalmologist ASAP.   Neuro/ Feet: Does  have known diabetic peripheral neuropathy.  Renal: Patient does  have known baseline CKD. She is not on an ACEI/ARB at present. I will defer starting an ACEI/ARB to her PCP , if no contraindications.    3) Lipids: Patient is on a statin.    4) Hypertension: She is above goal of < 140/90 mmHg. Will monitor.       Signed electronically by: Lyndle Herrlich, MD  [facility] Endocrinology  [facility] [address] [place] [place], [place] [address] Phone: [phone] FAX: [phone]   CC: Evaristo Bury, NP [address] [place] [address] Phone: [phone]  Fax: [phone]    Return to Endocrinology clinic as below: No future appointments.

## 2018-11-24 NOTE — Patient Instructions (Addendum)
-   STOP GLIPIZIDE  - Continue Metformin 500 mg twice a day with meals - Decrease Novolin 70/30 to 40 units TWICE a day with Breakfast and Supper (Take it 30 minutes before you eat if you can, but not longer then that )   - Choose healthy, lower carb lower calorie snacks: toss salad, cooked vegetables, cottage cheese, peanut butter, low fat cheese / string cheese, lower sodium deli meat, tuna salad or chicken salad    - HOW TO TREAT LOW BLOOD SUGARS (Blood sugar LESS THAN 70 MG/DL)  Please follow the RULE OF 15 for the treatment of hypoglycemia treatment (when your (blood sugars are less than 70 mg/dL)    STEP 1: Take 15 grams of carbohydrates when your blood sugar is low, which includes:   3-4 GLUCOSE TABS  OR  3-4 OZ OF JUICE OR REGULAR SODA OR  ONE TUBE OF GLUCOSE GEL     STEP 2: RECHECK blood sugar in 15 MINUTES STEP 3: If your blood sugar is still low at the 15 minute recheck --> then, go back to STEP 1 and treat AGAIN with another 15 grams of carbohydrates.

## 2018-11-26 ENCOUNTER — Telehealth: Payer: Self-pay

## 2018-11-26 NOTE — Telephone Encounter (Signed)
Called [facility], spoke with Taron. I advised her I have the surgical records with information of placement of clips and coils for consideration for MRA imaging study. I confirmed the fax number of [phone]

## 2018-11-26 NOTE — Telephone Encounter (Signed)
Patient calling back to check status of records being reviewed from previous colon and path done in 2017. Records on Dr.Jacobs desk still in review.

## 2018-11-27 ENCOUNTER — Encounter: Payer: Self-pay | Admitting: Nurse Practitioner

## 2018-11-28 ENCOUNTER — Encounter: Payer: Self-pay | Admitting: Nurse Practitioner

## 2018-11-28 ENCOUNTER — Other Ambulatory Visit: Payer: Self-pay | Admitting: *Deleted

## 2018-11-28 DIAGNOSIS — Z794 Long term (current) use of insulin: Secondary | ICD-10-CM

## 2018-11-28 DIAGNOSIS — E1122 Type 2 diabetes mellitus with diabetic chronic kidney disease: Secondary | ICD-10-CM

## 2018-11-28 DIAGNOSIS — N183 Chronic kidney disease, stage 3 (moderate): Principal | ICD-10-CM

## 2018-11-28 MED ORDER — BLOOD GLUCOSE MONITOR KIT: PACK | 0 refills | Status: DC

## 2018-11-28 NOTE — Telephone Encounter (Signed)
[place].  Indication "high risk screening".  I am not sure why she is considered to be high risk.  3 polyps were removed.  They were all subcentimeter.  2 of them proved to be adenomas.  She was recommended to have repeat colonoscopy at 5-year interval and I agree with that recommendation.  Can you please contact her.  I recommend colonoscopy August 2022 which is the [facility].  Please put her in our recall system.

## 2018-11-28 NOTE — Telephone Encounter (Signed)
The patient has been notified of this information and all questions answered.   Recall in Epic. 

## 2018-11-28 NOTE — Telephone Encounter (Signed)
From: [name]  Sent: 11/27/2018  1:28 PM EST  To: [facility] Clinical Pool  Subject: Non-Urgent Medical Question             I am in need of a new blood sugar meter. I have found a free one on line. It is called one touch version flex. Rxpcn is [facility]   If you could , at your convenience [facility], I would be grateful.  No rush thanks as always for your help.

## 2018-11-29 ENCOUNTER — Encounter: Payer: Self-pay | Admitting: Nurse Practitioner

## 2018-12-08 ENCOUNTER — Encounter: Payer: Self-pay | Admitting: Nurse Practitioner

## 2018-12-08 ENCOUNTER — Encounter: Payer: Self-pay | Admitting: Radiology

## 2018-12-08 ENCOUNTER — Telehealth: Payer: Self-pay | Admitting: Nurse Practitioner

## 2018-12-08 MED ORDER — LISINOPRIL 10 MG PO TABS: 10.0000 mg | ORAL_TABLET | Freq: Every day | ORAL | 3 refills | Status: DC

## 2018-12-08 NOTE — Telephone Encounter (Signed)
Please let the patient know that after discussion with her endocrinologist I have sent a new prescription for lisinopril 10mg  once daily for her blood pressure and also to protect her kidneys from diabetes. She will continue her other medications as she has been taking them Please have her schedule a follow up office visit in about 6-8 weeks so I can see how she is doing on the new medication.

## 2018-12-08 NOTE — Telephone Encounter (Signed)
-----   Message from Orland Penman, MD sent at 12/02/2018 11:26 PM EST ----- Regarding: RE: Hi,   I am sorry my note was not reflective of the discussion I had with the patient . I tend to blab a lot during my visit but forget when it's time to write my note. ACE inhibitors or Angiotensin receptor blockers ( not together )  are protective of kidney function in  the setting of diabetes and evidence of nephropathy .   This is just a recommendation from my part , but I leave the final decision to the PCP and your discretion , since you know more about the patient then I do.   I hope that is helpful  and I appreciate you reaching out to me .   Abby ----- Message ----- From: Evaristo Bury, NP Sent: 12/01/2018   1:05 PM EST To: Orland Penman, MD  Hi Dr Lonzo Cloud, This is our mutual patient, she has sent me a Mychart message asking me to start her on lisinopril per recommendations by you. I could not find this on your recent note with her and I just wanted to touch base with you about this before I sent her a new medication.  Thanks, [place]

## 2018-12-09 ENCOUNTER — Encounter: Payer: Self-pay | Admitting: Nurse Practitioner

## 2018-12-09 MED ORDER — ACCU-CHEK GUIDE W/DEVICE KIT: 1.0000 | PACK | 0 refills | Status: DC

## 2018-12-09 MED ORDER — GLUCOSE BLOOD VI STRP: 1.0000 | ORAL_STRIP | Freq: Four times a day (QID) | 5 refills | Status: DC | PRN

## 2018-12-09 NOTE — Addendum Note (Signed)
Addended by: Merrilyn Puma on: 12/09/2018 09:19 AM   Modules accepted: Orders

## 2018-12-09 NOTE — Telephone Encounter (Addendum)
Pt informed of below.  She is also requesting me to call [employer] to determine what glucometer is covered. I called [employer] @ [phone] and spoke to [facility]. She states Accuchek is the preferred meter and transferred me to another line. I waited on hold for 25 minutes then spoke to a rep who states the AccuChek guide meter is preferred.   New Rx for meter/supplies sent. See meds. Left detailed message informing pt.

## 2018-12-11 MED ORDER — GLUCOSE BLOOD VI STRP: 1.0000 | ORAL_STRIP | Freq: Four times a day (QID) | 5 refills | Status: DC | PRN

## 2018-12-11 MED ORDER — ACCU-CHEK AVIVA PLUS W/DEVICE KIT: 1.0000 | PACK | 0 refills | Status: DC

## 2018-12-11 NOTE — Telephone Encounter (Signed)
See 12/09/18 patient message. Updated rxs for glucometer and supplies sent to [facility] per patient request. See meds.

## 2018-12-11 NOTE — Addendum Note (Signed)
Addended by: Merrilyn Puma on: 12/11/2018 10:49 AM   Modules accepted: Orders

## 2018-12-15 ENCOUNTER — Ambulatory Visit
Admission: RE | Admit: 2018-12-15 | Discharge: 2018-12-15 | Disposition: A | Discharge status: Home / Self Care | Payer: Medicare PPO | Source: Ambulatory Visit | Attending: Neurology | Admitting: Neurology

## 2018-12-15 DIAGNOSIS — I671 Cerebral aneurysm, nonruptured: Secondary | ICD-10-CM

## 2018-12-16 ENCOUNTER — Ambulatory Visit (INDEPENDENT_AMBULATORY_CARE_PROVIDER_SITE_OTHER): Payer: Self-pay | Admitting: Orthopaedic Surgery

## 2018-12-16 ENCOUNTER — Telehealth: Payer: Self-pay

## 2018-12-16 NOTE — Telephone Encounter (Signed)
-----   Message from Drema Dallas, DO sent at 12/15/2018 12:47 PM EST ----- No definite aneurysm seen.  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  It is benign.

## 2018-12-16 NOTE — Telephone Encounter (Signed)
Called and spoke with Pt, advised her of results of MRA

## 2018-12-17 ENCOUNTER — Encounter: Payer: Self-pay | Admitting: Nurse Practitioner

## 2018-12-17 ENCOUNTER — Other Ambulatory Visit: Payer: Self-pay | Admitting: Nurse Practitioner

## 2018-12-17 DIAGNOSIS — Z1239 Encounter for other screening for malignant neoplasm of breast: Secondary | ICD-10-CM

## 2018-12-22 ENCOUNTER — Encounter: Payer: Self-pay | Admitting: Internal Medicine

## 2018-12-22 ENCOUNTER — Ambulatory Visit: Payer: Medicare PPO | Admitting: Internal Medicine

## 2018-12-22 VITALS — BP 142/62 | HR 70 | Ht 62.0 in | Wt 191.8 lb

## 2018-12-22 DIAGNOSIS — E1142 Type 2 diabetes mellitus with diabetic polyneuropathy: Secondary | ICD-10-CM

## 2018-12-22 DIAGNOSIS — N183 Chronic kidney disease, stage 3 unspecified: Secondary | ICD-10-CM

## 2018-12-22 DIAGNOSIS — Z794 Long term (current) use of insulin: Secondary | ICD-10-CM | POA: Diagnosis not present

## 2018-12-22 DIAGNOSIS — E1122 Type 2 diabetes mellitus with diabetic chronic kidney disease: Secondary | ICD-10-CM | POA: Diagnosis not present

## 2018-12-22 LAB — POCT GLYCOSYLATED HEMOGLOBIN (HGB A1C): Hemoglobin A1C: 9.2 % — AB (ref 4.0–5.6)

## 2018-12-22 NOTE — Progress Notes (Addendum)
Name: KHRISTIN KELEHER  Age/ Sex: 63 y.o., female   MRN/ DOB: [id number], 08/11/1956     PCP: Lance Sell, NP   Reason for Endocrinology Evaluation: Type 2 Diabetes Mellitus  Initial Endocrine Consultative Visit: 11/24/18    PATIENT IDENTIFIER: Ms. IRVIN LIZAMA is a 63 y.o. female with a past medical history of DM, mitral valve prolapse, dyslipidemia, OSA , brain aneurysm and HTN . The patient has followed with Endocrinology clinic since 11/24/18 for consultative assistance with management of her diabetes.  Ms. Mettler moved from [place] to be close to her youngest  daughter   DIABETIC HISTORY:  Ms. Puleo was diagnosed with T2DM ~ 2001. She has tried Actosdoes not recall intolerance. Followed by Metformin, then Glipizide then Novolin.  Has tried trulicity, but due to insurance coverage this was stopped. Marland Kitchen Her hemoglobin A1c has ranged 7.9% in 09/2018, peaking at 8.5% in 06/2018.   SUBJECTIVE:   During the last visit (11/24/2018): We continued Metformin 500 mg BID, We reduced Novolin to 40 units due to tight BG's at times. We also stopped Glipizide due to increased risk of hypoglycemia in the setting of 2 additional insulin regimens.   Today (12/22/2018): Ms. Riendeau is here for a 4 weeks diabetes managent. She was very anxious and in the midst of having a panic attack. Pt initially wanted to leave the office prior to her visit due to panic attack.  She checks her blood sugars 2 times daily, before breakfast and supper . The patient has not had hypoglycemic episodes since the last clinic visit. Otherwise, the patient has not required any recent emergency interventions for hypoglycemia and has not had recent hospitalizations secondary to hyper or hypoglycemic episodes.    ROS: As per HPI and as detailed below: Review of Systems  Constitutional: Negative for weight loss.  Respiratory: Negative for cough.     Cardiovascular: Negative for chest pain and palpitations.  Gastrointestinal: Negative for diarrhea and nausea.  Psychiatric/Behavioral: The patient is nervous/anxious.       HOME DIABETES REGIMEN:  Novolin 40 units BID with meals  Metformin 500 mg BID    METER DOWNLOAD SUMMARY: Date range evaluated: 1/5-12/22/18 Fingerstick Blood Glucose Tests = 74 Average Number Tests/Day = 2.5 Overall Mean FS Glucose = 257 Standard Deviation = 60.8  BG Ranges: Low = 131 High = 477   Hypoglycemic Events/30 Days: BG < 50 = 0 Episodes of symptomatic severe hypoglycemia = 0     HISTORY:  Past Medical History:  Past Medical History:  Diagnosis Date  . Anxiety   . Arthritis   . Asthma   . Chronic kidney disease   . Depression   . Diabetes mellitus without complication ([place])   . Fibromyalgia   . GERD (gastroesophageal reflux disease)   . Heart murmur   . History of chicken pox   . History of colon polyps   . Hypertension   . Legally blind   . Migraines     Past Surgical History:  Past Surgical History:  Procedure Laterality Date  . ABDOMINAL HYSTERECTOMY  2012  . APPENDECTOMY  1972  . CEREBRAL ANEURYSM REPAIR    . CHOLECYSTECTOMY  2015  . MENISCUS REPAIR Bilateral      Social History:  reports that she has quit smoking. She has never used smokeless tobacco. She reports that she does not drink alcohol or use drugs. Family History:  Family History  Problem Relation Age of Onset  . Alcohol abuse  Mother   . Diabetes Mother   . Hypertension Mother   . Kidney disease Mother   . COPD Father   . Diabetes Father   . Early death Father   . Heart disease Father   . Hyperlipidemia Father   . Hypertension Father   . Diabetes Sister   . Hypertension Sister   . Breast cancer Sister   . Cancer Maternal Grandmother   . Alcohol abuse Maternal Grandfather   . Cancer Maternal Grandfather   . Cancer Paternal Grandmother   . Cancer Paternal Grandfather   . Diabetes Sister   .  Hypertension Sister   . Kidney disease Sister       HOME MEDICATIONS: Allergies as of 12/22/2018   No Known Allergies     Medication List       Accurate as of December 22, 2018  9:22 AM. Always use your most recent med list.        ACCU-CHEK AVIVA PLUS w/Device Kit 1 each by Does not apply route as directed. E11.22   ACCU-CHEK FASTCLIX LANCETS Misc   amLODipine 10 MG tablet Commonly known as:  NORVASC Take 10 mg by mouth daily.   atorvastatin 80 MG tablet Commonly known as:  LIPITOR Take 80 mg by mouth daily.   baclofen 10 MG tablet Commonly known as:  LIORESAL Take 1 tablet (10 mg total) by mouth 3 (three) times daily.   blood glucose meter kit and supplies Kit Dispense based on patient and insurance preference. Use up to four times daily as directed.   cephALEXin 500 MG capsule Commonly known as:  KEFLEX   chlorhexidine 0.12 % solution Commonly known as:  PERIDEX   furosemide 40 MG tablet Commonly known as:  LASIX Take 40 mg by mouth daily.   gabapentin 300 MG capsule Commonly known as:  NEURONTIN 3 (three) times daily.   gabapentin 300 MG capsule Commonly known as:  NEURONTIN Take 1 capsule (300 mg total) by mouth 3 (three) times daily.   glucose blood test strip Commonly known as:  ACCU-CHEK GUIDE 1 each by Other route 4 (four) times daily as needed for other. Use as instructed   glucose blood test strip Commonly known as:  ACCU-CHEK AVIVA PLUS 1 each by Other route 4 (four) times daily as needed for other. Use as instructed   lisinopril 10 MG tablet Commonly known as:  PRINIVIL,ZESTRIL Take 1 tablet (10 mg total) by mouth daily.   LORazepam 0.5 MG tablet Commonly known as:  ATIVAN Take 1 tablet (0.5 mg total) by mouth as needed.   metFORMIN 500 MG 24 hr tablet Commonly known as:  GLUCOPHAGE-XR Take 500 mg by mouth 2 (two) times daily.   metoprolol tartrate 25 MG tablet Commonly known as:  LOPRESSOR Take 25 mg by mouth 2 (two) times  daily.   NOVOLIN 70/30 [place] Inject 40 Units into the skin 2 (two) times daily.   pantoprazole 20 MG tablet Commonly known as:  PROTONIX Take 20 mg by mouth 2 (two) times daily.   potassium chloride SA 20 MEQ tablet Commonly known as:  K-DUR,KLOR-CON Take 20 mEq by mouth daily.   sertraline 50 MG tablet Commonly known as:  ZOLOFT Take 50 mg by mouth daily.        OBJECTIVE:   Vital Signs: BP (!) 142/62 (BP Location: Left Arm, Patient Position: Sitting, Cuff Size: Normal)   Pulse 70   Ht _0  (1.575 m)   Wt 191 lb 12.8 oz (  87 kg)   SpO2 96%   BMI 35.08 kg/m   Wt Readings from Last 3 Encounters:  12/22/18 191 lb 12.8 oz (87 kg)  11/24/18 190 lb 9.6 oz (86.5 kg)  09/29/18 184 lb (83.5 kg)     Exam: General: Pt appears anxious  Lungs: Clear with good BS bilat with no rales, rhonchi, or wheezes  Heart: RRR with normal S1 and S2 and no gallops; no murmurs; no rub  Abdomen: Normoactive bowel sounds, soft, nontender, without masses or organomegaly palpable  Extremities: No pretibial edema. No tremor.  Neuro: MS is good with appropriate affect, pt is alert and Ox3    DM foot exam:    11/24/2018 The skin of the feet is intact without sores or ulcerations. The pedal pulses are 2+ on right and 2+ on left. The sensation is decreased to a screening 5.07, 10 gram monofilament bilaterally         DATA REVIEWED:  Lab Results  Component Value Date   HGBA1C 9.2 (A) 12/22/2018   HGBA1C 7.9 (H) 09/26/2018   HGBA1C 8.5 (H) 07/18/2018   Lab Results  Component Value Date   MICROALBUR 13.1 (H) 09/26/2018   LDLCALC 61 07/18/2018   CREATININE 1.20 09/26/2018   Lab Results  Component Value Date   MICRALBCREAT 3.5 09/26/2018    Lab Results  Component Value Date   CHOL 141 07/18/2018   HDL 41.60 07/18/2018   LDLCALC 61 07/18/2018   TRIG 191.0 (H) 07/18/2018   CHOLHDL 3 07/18/2018         ASSESSMENT / PLAN / RECOMMENDATIONS:   1) Type 2 Diabetes Mellitus, Poorly  controlled, With CKD III and neuropathic complications - Most recent A1c of 9.2%. Goal A1c < 7.0 %.   Plan: - This was a limited visit as the patient was having a panic attack during her office visit. As per patient this is a chronic issue and used to be on ativan but since having to be on gabapentin, ativan has been discontinued.  - In review of her meter today, she has been having severe hyperglycemia. I have encouraged her that in the future she needs to contact us immediatly with persistent hyperglycemia and not wait on nect visit, weeks down the road.  - We stopped her glipizide on last visit due to increased risk of hypoglycemia with 2 types of insulin on board, we also reduced her Novolin dose due to tight BG's on the meter from last visit.  - She is intolerant to increasing the metformin  - We briefly discussed adding a GLP-1 agonist in the future.     MEDICATIONS:  Continue Metformin 500 mg BID  Increase Novolin to 50 units BID with breakfast and Supper   EDUCATION / INSTRUCTIONS:  BG monitoring instructions: Patient is instructed to check her blood sugars 2 times a day, before breakfast and supper.  Call [facility] if: BG persistently < 70 or > 300. . I reviewed the Rule of 15 for the treatment of hypoglycemia in detail with the patient. Literature supplied.    F/U in 6 weeks    Signed electronically by: Mack Guise, MD  [facility] Endocrinology  [facility] [address] [place],  [address] Phone: [phone] FAX: [phone]   CC: Lance Sell, NP [place] [address] Phone: [phone]  Fax: [phone]  Return to Endocrinology clinic as below: No future appointments.

## 2018-12-22 NOTE — Patient Instructions (Signed)
-   Continue Metformin 500 mg twice a day with meals - Increase Novolin 70/30 to 50 units TWICE a day with Breakfast and Supper (Take it 30 minutes before you eat if you can, but not longer then that )   - Choose healthy, lower carb lower calorie snacks: toss salad, cooked vegetables, cottage cheese, peanut butter, low fat cheese / string cheese, lower sodium deli meat, tuna salad or chicken salad    - HOW TO TREAT LOW BLOOD SUGARS (Blood sugar LESS THAN 70 MG/DL)  Please follow the RULE OF 15 for the treatment of hypoglycemia treatment (when your (blood sugars are less than 70 mg/dL)    STEP 1: Take 15 grams of carbohydrates when your blood sugar is low, which includes:   3-4 GLUCOSE TABS  OR  3-4 OZ OF JUICE OR REGULAR SODA OR  ONE TUBE OF GLUCOSE GEL     STEP 2: RECHECK blood sugar in 15 MINUTES STEP 3: If your blood sugar is still low at the 15 minute recheck --> then, go back to STEP 1 and treat AGAIN with another 15 grams of carbohydrates.

## 2018-12-23 ENCOUNTER — Encounter: Payer: Self-pay | Admitting: Internal Medicine

## 2018-12-25 ENCOUNTER — Other Ambulatory Visit: Payer: Self-pay | Admitting: *Deleted

## 2018-12-25 MED ORDER — METFORMIN HCL ER 500 MG PO TB24: 500.0000 mg | ORAL_TABLET | Freq: Two times a day (BID) | ORAL | 1 refills | Status: DC

## 2018-12-29 ENCOUNTER — Other Ambulatory Visit: Payer: Self-pay

## 2018-12-29 MED ORDER — TIZANIDINE HCL 2 MG PO CAPS: 2.0000 mg | ORAL_CAPSULE | Freq: Three times a day (TID) | ORAL | 3 refills | Status: DC

## 2018-12-30 ENCOUNTER — Encounter: Payer: Self-pay | Admitting: Internal Medicine

## 2018-12-30 ENCOUNTER — Other Ambulatory Visit: Payer: Self-pay

## 2018-12-30 ENCOUNTER — Other Ambulatory Visit: Payer: Self-pay | Admitting: *Deleted

## 2018-12-30 MED ORDER — SERTRALINE HCL 50 MG PO TABS: 50.0000 mg | ORAL_TABLET | Freq: Every day | ORAL | 1 refills | Status: DC

## 2018-12-30 MED ORDER — TIZANIDINE HCL 2 MG PO TABS: 2.0000 mg | ORAL_TABLET | Freq: Three times a day (TID) | ORAL | 3 refills | Status: DC

## 2018-12-31 ENCOUNTER — Telehealth: Payer: Self-pay | Admitting: Internal Medicine

## 2018-12-31 ENCOUNTER — Encounter: Payer: Self-pay | Admitting: Internal Medicine

## 2018-12-31 MED ORDER — SEMAGLUTIDE(0.25 OR 0.5MG/DOS) 2 MG/1.5ML ~~LOC~~ SOPN: 0.2500 mg | PEN_INJECTOR | SUBCUTANEOUS | 3 refills | Status: DC

## 2018-12-31 NOTE — Telephone Encounter (Signed)
Pt sent a message stating Ozempic is not covered by insurance. [facility] has a generic option and she would like to try semaglutide.    A prescription was sent at 0.25 mg weekly     Abby Raelyn Mora, MD  [facility] Endocrinology  [facility] [address] Laurell Josephs [address] [place], [place] [address] Phone: [phone] FAX: [phone]

## 2019-01-01 NOTE — Telephone Encounter (Signed)
Please refer to pt response 

## 2019-01-06 ENCOUNTER — Other Ambulatory Visit: Payer: Self-pay | Admitting: Nurse Practitioner

## 2019-01-06 ENCOUNTER — Ambulatory Visit
Admission: RE | Admit: 2019-01-06 | Discharge: 2019-01-06 | Disposition: A | Discharge status: Home / Self Care | Payer: Medicare PPO | Source: Ambulatory Visit

## 2019-01-06 DIAGNOSIS — Z1231 Encounter for screening mammogram for malignant neoplasm of breast: Secondary | ICD-10-CM

## 2019-01-21 ENCOUNTER — Encounter: Payer: Self-pay | Admitting: Internal Medicine

## 2019-01-21 ENCOUNTER — Other Ambulatory Visit: Payer: Self-pay | Admitting: Internal Medicine

## 2019-01-30 ENCOUNTER — Encounter: Payer: Self-pay | Admitting: Internal Medicine

## 2019-01-30 ENCOUNTER — Other Ambulatory Visit: Payer: Self-pay

## 2019-01-30 MED ORDER — INSULIN PEN NEEDLE 31G X 5 MM MISC: 1.0000 | Freq: Two times a day (BID) | 6 refills | Status: DC

## 2019-02-02 ENCOUNTER — Encounter: Payer: Self-pay | Admitting: Nurse Practitioner

## 2019-02-02 ENCOUNTER — Other Ambulatory Visit: Payer: Self-pay

## 2019-02-02 MED ORDER — GABAPENTIN 300 MG PO CAPS: 300.0000 mg | ORAL_CAPSULE | Freq: Three times a day (TID) | ORAL | 3 refills | Status: DC

## 2019-02-07 ENCOUNTER — Encounter: Payer: Self-pay | Admitting: Internal Medicine

## 2019-02-09 ENCOUNTER — Other Ambulatory Visit: Payer: Self-pay

## 2019-02-09 MED ORDER — INSULIN PEN NEEDLE 31G X 5 MM MISC: 1.0000 | Freq: Two times a day (BID) | 6 refills | Status: DC

## 2019-02-17 ENCOUNTER — Other Ambulatory Visit: Payer: Self-pay

## 2019-02-17 MED ORDER — INSULIN PEN NEEDLE 31G X 6 MM MISC: 1.0000 | Freq: Two times a day (BID) | 0 refills | Status: DC

## 2019-03-12 ENCOUNTER — Encounter: Payer: Self-pay | Admitting: Family

## 2019-03-13 NOTE — Telephone Encounter (Signed)
Can we see if she has a new PCP? According to the system, she has a new provider? If that's the case, her new PCP should do her referral.  If she is staying at our office, I will do referral. She should plan to touch base with Claris Gower in June/ July while we get another provider in place to cover [facility]'s patients.

## 2019-03-16 ENCOUNTER — Other Ambulatory Visit: Payer: Self-pay | Admitting: Family

## 2019-03-16 ENCOUNTER — Ambulatory Visit: Payer: Medicare PPO | Admitting: Physician Assistant

## 2019-03-16 DIAGNOSIS — N189 Chronic kidney disease, unspecified: Secondary | ICD-10-CM

## 2019-03-16 NOTE — Telephone Encounter (Signed)
Referral will be done; suspect she won't be able to get appointment before June or July however due to COVID-19;

## 2019-03-20 NOTE — Telephone Encounter (Signed)
I called [facility] regarding new referral. They state that they will need updated labs to go with the referral. No new office is needed just labs.   Please advise

## 2019-03-21 ENCOUNTER — Encounter: Payer: Self-pay | Admitting: Internal Medicine

## 2019-03-21 ENCOUNTER — Encounter: Payer: Self-pay | Admitting: Family

## 2019-04-08 ENCOUNTER — Ambulatory Visit: Payer: Medicare PPO | Admitting: Physician Assistant

## 2019-04-09 ENCOUNTER — Other Ambulatory Visit: Payer: Self-pay

## 2019-04-09 ENCOUNTER — Encounter: Payer: Self-pay | Admitting: Physician Assistant

## 2019-04-09 ENCOUNTER — Ambulatory Visit (INDEPENDENT_AMBULATORY_CARE_PROVIDER_SITE_OTHER): Payer: Medicare PPO | Admitting: Physician Assistant

## 2019-04-09 ENCOUNTER — Ambulatory Visit (INDEPENDENT_AMBULATORY_CARE_PROVIDER_SITE_OTHER): Payer: Medicare PPO

## 2019-04-09 VITALS — BP 135/74 | HR 72 | Temp 98.1°F | Resp 14 | Wt 189.0 lb

## 2019-04-09 DIAGNOSIS — M791 Myalgia, unspecified site: Secondary | ICD-10-CM | POA: Diagnosis not present

## 2019-04-09 DIAGNOSIS — E1165 Type 2 diabetes mellitus with hyperglycemia: Secondary | ICD-10-CM

## 2019-04-09 DIAGNOSIS — E1169 Type 2 diabetes mellitus with other specified complication: Secondary | ICD-10-CM

## 2019-04-09 DIAGNOSIS — Z794 Long term (current) use of insulin: Secondary | ICD-10-CM

## 2019-04-09 DIAGNOSIS — F411 Generalized anxiety disorder: Secondary | ICD-10-CM

## 2019-04-09 DIAGNOSIS — Z7689 Persons encountering health services in other specified circumstances: Secondary | ICD-10-CM

## 2019-04-09 DIAGNOSIS — G894 Chronic pain syndrome: Secondary | ICD-10-CM

## 2019-04-09 DIAGNOSIS — R0602 Shortness of breath: Secondary | ICD-10-CM | POA: Diagnosis not present

## 2019-04-09 DIAGNOSIS — N183 Chronic kidney disease, stage 3 unspecified: Secondary | ICD-10-CM

## 2019-04-09 DIAGNOSIS — R0789 Other chest pain: Secondary | ICD-10-CM | POA: Diagnosis not present

## 2019-04-09 DIAGNOSIS — Z79899 Other long term (current) drug therapy: Secondary | ICD-10-CM

## 2019-04-09 DIAGNOSIS — IMO0002 Reserved for concepts with insufficient information to code with codable children: Secondary | ICD-10-CM

## 2019-04-09 DIAGNOSIS — I671 Cerebral aneurysm, nonruptured: Secondary | ICD-10-CM

## 2019-04-09 DIAGNOSIS — E1122 Type 2 diabetes mellitus with diabetic chronic kidney disease: Secondary | ICD-10-CM | POA: Diagnosis not present

## 2019-04-09 DIAGNOSIS — Z5181 Encounter for therapeutic drug level monitoring: Secondary | ICD-10-CM

## 2019-04-09 DIAGNOSIS — M5412 Radiculopathy, cervical region: Secondary | ICD-10-CM

## 2019-04-09 DIAGNOSIS — M2550 Pain in unspecified joint: Secondary | ICD-10-CM

## 2019-04-09 DIAGNOSIS — E785 Hyperlipidemia, unspecified: Secondary | ICD-10-CM

## 2019-04-09 DIAGNOSIS — M542 Cervicalgia: Secondary | ICD-10-CM

## 2019-04-09 LAB — POCT GLYCOSYLATED HEMOGLOBIN (HGB A1C): HbA1c, POC (controlled diabetic range): 9.2 % — AB (ref 0.0–7.0)

## 2019-04-09 MED ORDER — DIAZEPAM 5 MG PO TABS: 5.0000 mg | ORAL_TABLET | Freq: Once | ORAL | 0 refills | Status: AC

## 2019-04-09 MED ORDER — PREGABALIN 50 MG PO CAPS: 50.0000 mg | ORAL_CAPSULE | Freq: Two times a day (BID) | ORAL | 0 refills | Status: DC

## 2019-04-09 MED ORDER — ATORVASTATIN CALCIUM 40 MG PO TABS: 40.0000 mg | ORAL_TABLET | Freq: Every day | ORAL | 1 refills | Status: DC

## 2019-04-09 NOTE — Patient Instructions (Addendum)
For pain: I would like you to stop the tizanidine for now Start pregabalin/Lyrica 50 mg scheduled twice a day I would also like you to reduce your atorvastatin in half.  Take 40 mg at bedtime.  We may want to consider switching you to a cholesterol medicine called Livalo which does not have any associated muscle ache as a side effect I have ordered an MRI of your neck and imaging will call you to schedule this [phone] Follow-up E visit in 2 weeks  For shortness of breath: Chest x-ray today At a later date (when pandemic concerns are less) come back for pulmonary function testing  Myofascial Pain Syndrome and Fibromyalgia Myofascial pain syndrome and fibromyalgia are both pain disorders. This pain may be felt mainly in your muscles.  Myofascial pain syndrome: ? Always has tender points in the muscle that will cause pain when pressed (trigger points). The pain may come and go. ? Usually affects your neck, upper back, and shoulder areas. The pain often radiates into your arms and hands.  Fibromyalgia: ? Has muscle pains and tenderness that come and go. ? Is often associated with fatigue and sleep problems. ? Has trigger points. ? Tends to be long-lasting (chronic), but is not life-threatening. Fibromyalgia and myofascial pain syndrome are not the same. However, they often occur together. If you have both conditions, each can make the other worse. Both are common and can cause enough pain and fatigue to make day-to-day activities difficult. Both can be hard to diagnose because their symptoms are common in many other conditions. What are the causes? The exact causes of these conditions are not known. What increases the risk? You are more likely to develop this condition if:  You have a family history of the condition.  You have certain triggers, such as: ? Spine disorders. ? An injury (trauma) or other physical stressors. ? Being under a lot of stress. ? Medical conditions such as  osteoarthritis, rheumatoid arthritis, or lupus. What are the signs or symptoms? Fibromyalgia The main symptom of fibromyalgia is widespread pain and tenderness in your muscles. Pain is sometimes described as stabbing, shooting, or burning. You may also have:  Tingling or numbness.  Sleep problems and fatigue.  Problems with attention and concentration (fibro fog). Other symptoms may include:  Bowel and bladder problems.  Headaches.  Visual problems.  Problems with odors and noises.  Depression or mood changes.  Painful menstrual periods (dysmenorrhea).  Dry skin or eyes. These symptoms can vary over time. Myofascial pain syndrome Symptoms of myofascial pain syndrome include:  Tight, ropy bands of muscle.  Uncomfortable sensations in muscle areas. These may include aching, cramping, burning, numbness, tingling, and weakness.  Difficulty moving certain parts of the body freely (poor range of motion). How is this diagnosed? This condition may be diagnosed by your symptoms and medical history. You will also have a physical exam. In general:  Fibromyalgia is diagnosed if you have pain, fatigue, and other symptoms for more than 3 months, and symptoms cannot be explained by another condition.  Myofascial pain syndrome is diagnosed if you have trigger points in your muscles, and those trigger points are tender and cause pain elsewhere in your body (referred pain). How is this treated? Treatment for these conditions depends on the type that you have.  For fibromyalgia: ? Pain medicines, such as NSAIDs. ? Medicines for treating depression. ? Medicines for treating seizures. ? Medicines that relax the muscles.  For myofascial pain: ? Pain medicines, such  as NSAIDs. ? Cooling and stretching of muscles. ? Trigger point injections. ? Sound wave (ultrasound) treatments to stimulate muscles. Treating these conditions often requires a team of health care providers. These may  include:  Your primary care provider.  Physical therapist.  Complementary health care providers, such as massage therapists or acupuncturists.  Psychiatrist for cognitive behavioral therapy. Follow these instructions at home: Medicines  Take over-the-counter and prescription medicines only as told by your health care provider.  Do not drive or use heavy machinery while taking prescription pain medicine.  If you are taking prescription pain medicine, take actions to prevent or treat constipation. Your health care provider may recommend that you: ? Drink enough fluid to keep your urine pale yellow. ? Eat foods that are high in fiber, such as fresh fruits and vegetables, whole grains, and beans. ? Limit foods that are high in fat and processed sugars, such as fried or sweet foods. ? Take an over-the-counter or prescription medicine for constipation. Lifestyle   Exercise as directed by your health care provider or physical therapist.  Practice relaxation techniques to control your stress. You may want to try: ? Biofeedback. ? Visual imagery. ? Hypnosis. ? Muscle relaxation. ? Yoga. ? Meditation.  Maintain a healthy lifestyle. This includes eating a healthy diet and getting enough sleep.  Do not use any products that contain nicotine or tobacco, such as cigarettes and e-cigarettes. If you need help quitting, ask your health care provider. General instructions  Talk to your health care provider about complementary treatments, such as acupuncture or massage.  Consider joining a support group with others who are diagnosed with this condition.  Do not do activities that stress or strain your muscles. This includes repetitive motions and heavy lifting.  Keep all follow-up visits as told by your health care provider. This is important. Where to find more information  National Fibromyalgia Association: www.fmaware.org  Arthritis Foundation: www.arthritis.org  American Chronic  Pain Association: www.theacpa.org Contact a health care provider if:  You have new symptoms.  Your symptoms get worse or your pain is severe.  You have side effects from your medicines.  You have trouble sleeping.  Your condition is causing depression or anxiety. Summary  Myofascial pain syndrome and fibromyalgia are pain disorders.  Myofascial pain syndrome has tender points in the muscle that will cause pain when pressed (trigger points). Fibromyalgia also has muscle pains and tenderness that come and go, but this condition is often associated with fatigue and sleep disturbances.  Fibromyalgia and myofascial pain syndrome are not the same but often occur together, causing pain and fatigue that make day-to-day activities difficult.  Treatment for fibromyalgia includes taking medicines to relax the muscles and medicines for pain, depression, or seizures. Treatment for myofascial pain syndrome includes taking medicines for pain, cooling and stretching of muscles, and injecting medicines into trigger points.  Follow your health care provider's instructions for taking medicines and maintaining a healthy lifestyle. This information is not intended to replace advice given to you by your health care provider. Make sure you discuss any questions you have with your health care provider. Document Released: 11/05/2005 Document Revised: 11/20/2017 Document Reviewed: 11/20/2017 Elsevier Interactive Patient Education  2019 [employer].

## 2019-04-09 NOTE — Progress Notes (Signed)
HPI:                                                                Cassandra Reid is a 63 y.o. female who presents to [facility]: [facility] today to establish care  This is a pleasant 63 year old female with past medical history of cerebral aneurysm status post repair, hypertension, hyperlipidemia, uncontrolled type 2 diabetes, chronic kidney disease, chronic pain, fibromyalgia, IBS, anxiety and depression  Neck/back/muscle pain: Patient reports widespread muscle pain that is tender to the touch.  She also describes this other migratory, intermittent, brief "stinging/burning pain.  This is a chronic problem that first began about 15 years ago.  She reports pain is most prominent in her neck/upper back, shoulders, chest wall and upper flank.  She reports a squeezing sensation like a band wrapping around her chest and back as well as knots/spasms in her posterior neck.  She occasionally wakes up at night due to pain.  She states soreness is always present but she will have episodes of stinging sharp severe pain for 5 minutes.  She endorses paresthesias that she describes as "creepy crawly feeling like a bug is on me."   She also endorses generalized muscle weakness.  However she has had more progressive weakness of her dominant right arm and states in the last month she has broken multiple dishware at home due to dropping things.  She endorses paresthesias in the right forearm. She reports she was diagnosed with fibromyalgia in the past.  She does not believe that she has had any rheumatologic work-up. She states she has had x-rays of her neck and back in the past and was told that she had bulging disks. She also has a history of bilateral knee arthritis and is status post bilateral meniscal repair. She is currently taking Zanaflex but worries about her liver and kidney function.  She also has had some sedation with the Zanaflex, which improved with taking half a  tablet or 1 mg. Has tried Baclofen and Gabapentin without relief.      Depression screen [facility] 2/9 04/09/2019 07/18/2018  Decreased Interest 3 0  Down, Depressed, Hopeless 3 1  PHQ - 2 Score 6 1  Altered sleeping 3 -  Tired, decreased energy 3 -  Change in appetite 3 -  Feeling bad or failure about yourself  3 -  Trouble concentrating 3 -  Moving slowly or fidgety/restless 1 -  Suicidal thoughts 0 -  PHQ-9 Score 22 -  Difficult doing work/chores Very difficult -      Past Medical History:  Diagnosis Date  . Anxiety   . Arthritis   . Asthma   . Chronic kidney disease   . Depression   . Diabetes mellitus without complication ([place])   . Fibromyalgia   . GERD (gastroesophageal reflux disease)   . Heart murmur   . History of chicken pox   . History of colon polyps   . Hypertension   . Legally blind   . Migraines    Past Surgical History:  Procedure Laterality Date  . ABDOMINAL HYSTERECTOMY  2012  . APPENDECTOMY  1972  . CEREBRAL ANEURYSM REPAIR    . CHOLECYSTECTOMY  2015  . MENISCUS REPAIR Bilateral  Social History   Tobacco Use  . Smoking status: Former Research scientist (life sciences)  . Smokeless tobacco: Never Used  Substance Use Topics  . Alcohol use: Never    Frequency: Never   family history includes Alcohol abuse in her maternal grandfather and mother; Breast cancer in her sister; COPD in her father; Cancer in her maternal grandfather, maternal grandmother, paternal grandfather, and paternal grandmother; Diabetes in her father, mother, sister, and sister; Early death in her father; Heart disease in her father; Hyperlipidemia in her father; Hypertension in her father, mother, sister, and sister; Kidney disease in her mother and sister.   Review of Systems  HENT: Positive for sinus pressure.   Cardiovascular: Positive for palpitations.  Gastrointestinal: Positive for abdominal pain, constipation and diarrhea.       +GERD  Musculoskeletal: Positive for arthralgias (bilateral knee  and ankle), back pain, myalgias and neck pain.       + chest wall pain  Neurological: Positive for weakness (generalized, right arm).  Psychiatric/Behavioral: Positive for dysphoric mood and sleep disturbance. The patient is nervous/anxious.      Medications: Current Outpatient Medications  Medication Sig Dispense Refill  . ACCU-CHEK FASTCLIX LANCETS MISC     . amLODipine (NORVASC) 10 MG tablet Take 10 mg by mouth daily.     Marland Kitchen atorvastatin (LIPITOR) 40 MG tablet Take 1 tablet (40 mg total) by mouth at bedtime. 90 tablet 1  . blood glucose meter kit and supplies KIT Dispense based on patient and insurance preference. Use up to four times daily as directed. 1 each 0  . Blood Glucose Monitoring Suppl (ACCU-CHEK AVIVA PLUS) w/Device KIT 1 each by Does not apply route as directed. E11.22 1 kit 0  . chlorhexidine (PERIDEX) 0.12 % solution   0  . furosemide (LASIX) 40 MG tablet Take 40 mg by mouth daily.     Marland Kitchen glucose blood (ACCU-CHEK AVIVA PLUS) test strip 1 each by Other route 4 (four) times daily as needed for other. Use as instructed 200 each 5  . Insulin NPH Isophane & Regular (NOVOLIN 70/30 [place]) Inject 50 Units into the skin 2 (two) times daily.    . Insulin Pen Needle 31G X 6 MM MISC 1 Package by Does not apply route 2 (two) times daily. Use as instructed to inject insulin 2 times daily 100 each 0  . lisinopril (PRINIVIL,ZESTRIL) 10 MG tablet Take 1 tablet (10 mg total) by mouth daily. 30 tablet 3  . LORazepam (ATIVAN) 0.5 MG tablet Take 1 tablet (0.5 mg total) by mouth as needed. (Patient not taking: Reported on 11/24/2018) 30 tablet 0  . metFORMIN (GLUCOPHAGE-XR) 500 MG 24 hr tablet Take 1 tablet (500 mg total) by mouth 2 (two) times daily. 180 tablet 1  . metoprolol tartrate (LOPRESSOR) 25 MG tablet Take 25 mg by mouth 2 (two) times daily.     . potassium chloride SA (K-DUR,KLOR-CON) 20 MEQ tablet Take 20 mEq by mouth daily.     . pregabalin (LYRICA) 50 MG capsule Take 1 capsule (50 mg  total) by mouth 2 (two) times daily. 30 capsule 0  . tiZANidine (ZANAFLEX) 2 MG tablet Take 1 tablet (2 mg total) by mouth 3 (three) times daily. 270 tablet 3   No current facility-administered medications for this visit.    No Known Allergies     Objective:  BP 135/74   Pulse 72   Temp 98.1 F (36.7 C)   Wt 189 lb (85.7 kg)   BMI 34.57 kg/m  Gen:  alert, not ill-appearing, no distress, appropriate for age, obese female HEENT: head normocephalic without obvious abnormality, conjunctiva and cornea clear, trachea midline Pulm: Normal work of breathing, normal phonation, clear to auscultation bilaterally, no wheezes, rales or rhonchi CV: Normal rate, regular rhythm, s1 and s2 distinct, no murmurs, clicks or rubs  Neuro: alert and oriented x 3, no tremor, DTR's intact MSK: multiple trigger points and diffuse pain with light palpation of the chest wall, extremities, and spine; strength 4/5 in bilateral upper and lower extremies, extremities atraumatic, normal gait and station Skin: intact, no rashes on exposed skin, no jaundice, no cyanosis Psych: well-groomed, cooperative, good eye contact, depressed mood, affect mood-congruent and anxious, speech is articulate, thought processes clear and goal-directed  Diabetic Foot Exam - Simple   Simple Foot Form Diabetic Foot exam was performed with the following findings:  Yes 04/09/2019 10:47 AM  Visual Inspection No deformities, no ulcerations, no other skin breakdown bilaterally:  Yes Sensation Testing See comments:  Yes Pulse Check Posterior Tibialis and Dorsalis pulse intact bilaterally:  Yes Comments Right foot absent sensation to monofilament except for 3 and 7 Left foot absent sensation to monofilament except for 5, 8, 10     Lab Results  Component Value Date   CREATININE 1.20 09/26/2018   BUN 15 09/26/2018   NA 140 09/26/2018   K 3.9 09/26/2018   CL 105 09/26/2018   CO2 22 09/26/2018   Lab Results  Component Value Date    ALT 37 (H) 09/26/2018   AST 25 09/26/2018   ALKPHOS 109 09/26/2018   BILITOT 0.5 09/26/2018   Lab Results  Component Value Date   HGBA1C 9.2 (A) 12/22/2018   Lab Results  Component Value Date   CHOL 141 07/18/2018   HDL 41.60 07/18/2018   LDLCALC 61 07/18/2018   TRIG 191.0 (H) 07/18/2018   CHOLHDL 3 07/18/2018   The 10-year ASCVD risk score Mikey Bussing DC Jr., et al., 2013) is: 9.5%   Values used to calculate the score:     Age: 55 years     Sex: Female     Is Non-Hispanic African American: No     Diabetic: Yes     Tobacco smoker: No     Systolic Blood Pressure: 121 mmHg     Is BP treated: Yes     HDL Cholesterol: 41.6 mg/dL     Total Cholesterol: 141 mg/dL     Assessment and Plan: 63 y.o. female with   .Diagnoses and all orders for this visit:  Encounter to establish care  Uncontrolled type 2 diabetes mellitus with insulin therapy ([place]) -     POCT HgB A1C  CKD stage 3 due to type 2 diabetes mellitus ([place]) -     CBC with Differential/Platelet -     COMPLETE METABOLIC PANEL WITH GFR -     Magnesium  Myalgia -     CK -     CBC with Differential/Platelet -     Rheumatoid factor -     C-reactive protein -     Sedimentation rate -     ANA -     COMPLETE METABOLIC PANEL WITH GFR -     Magnesium -     pregabalin (LYRICA) 50 MG capsule; Take 1 capsule (50 mg total) by mouth 2 (two) times daily.  Polyarthralgia -     CK -     CBC with Differential/Platelet -     Rheumatoid factor -  C-reactive protein -     Sedimentation rate -     ANA -     COMPLETE METABOLIC PANEL WITH GFR -     Magnesium  Encounter for monitoring diuretic therapy -     COMPLETE METABOLIC PANEL WITH GFR -     Magnesium  Chronic pain syndrome -     pregabalin (LYRICA) 50 MG capsule; Take 1 capsule (50 mg total) by mouth 2 (two) times daily.  Hyperlipidemia associated with type 2 diabetes mellitus (HCC) -     atorvastatin (LIPITOR) 40 MG tablet; Take 1 tablet (40 mg total) by mouth at  bedtime.  Chest wall pain -     DG Chest 2 View  SOB (shortness of breath) -     DG Chest 2 View  Cervicalgia -     MR Cervical Spine Wo Contrast; Future  Cervical radicular pain -     MR Cervical Spine Wo Contrast; Future  Anxiety state -     diazepam (VALIUM) 5 MG tablet; Take 1 tablet (5 mg total) by mouth once for 1 dose. 30 minutes prior to MRI  Cerebral aneurysm without rupture     - Personally reviewed PMH, PSH, PFH, medications, allergies, HM - Age-appropriate cancer screening: mammogram UTD, Colonscopy UTD 2018 per patient, record requested; no long candidate for Pap smear - Tdap unkown - Pneumovax UTD per patient 2019 - Declines Shingrix - PHQ2 negative  Myalgia, Chronic pain syndrome DDx includes statin-induced myalgia/myopathy, fibromyalgia, and myofascial pain syndrome Rheumatologic work-up to include CBC, CMP, ANA, RF, ESR, CRP, CK Discontinue Zanaflex due to sedation and fall risk Start Lyrica 50 mg twice daily I think she will also benefit from physical therapy, but we will await MRI result before ordering this I am also reducing Atorvastatin to 40 mg nightly. Depending on CK level consider switching to Livalo  Neck pain Given progressive weakness of her right upper extremity and paresthesias in her right forearm I am ordering cervical MRI  Shortness of breath Patient is visibly anxious, no tachypnea, pulse ox 98% on room air at rest, no adventitious lung sounds We will obtain chest x-ray due to her chest wall pain  Patient education and anticipatory guidance given Patient agrees with treatment plan Follow-up E visit in 2 weeks as needed if symptoms worsen or fail to improve  Darlyne Russian PA-C

## 2019-04-10 LAB — COMPLETE METABOLIC PANEL WITH GFR
AG Ratio: 2 (calc) (ref 1.0–2.5)
ALT: 84 U/L — ABNORMAL HIGH (ref 6–29)
AST: 45 U/L — ABNORMAL HIGH (ref 10–35)
Albumin: 4.7 g/dL (ref 3.6–5.1)
Alkaline phosphatase (APISO): 89 U/L (ref 37–153)
BUN: 13 mg/dL (ref 7–25)
CO2: 25 mmol/L (ref 20–32)
Calcium: 9.9 mg/dL (ref 8.6–10.4)
Chloride: 101 mmol/L (ref 98–110)
Creat: 0.94 mg/dL (ref 0.50–0.99)
GFR, Est African American: 76 mL/min/{1.73_m2} (ref >=60)
GFR, Est Non African American: 65 mL/min/{1.73_m2} (ref >=60)
Globulin: 2.3 g/dL (calc) (ref 1.9–3.7)
Glucose, Bld: 274 mg/dL — ABNORMAL HIGH (ref 65–99)
Potassium: 3.3 mmol/L — ABNORMAL LOW (ref 3.5–5.3)
Sodium: 138 mmol/L (ref 135–146)
Total Bilirubin: 0.5 mg/dL (ref 0.2–1.2)
Total Protein: 7 g/dL (ref 6.1–8.1)

## 2019-04-10 LAB — CBC WITH DIFFERENTIAL/PLATELET
Absolute Monocytes: 428 cells/uL (ref 200–950)
Basophils Absolute: 61 cells/uL (ref 0–200)
Basophils Relative: 0.9 %
Eosinophils Absolute: 102 cells/uL (ref 15–500)
Eosinophils Relative: 1.5 %
HCT: 44.7 % (ref 35.0–45.0)
Hemoglobin: 14.7 g/dL (ref 11.7–15.5)
Lymphs Abs: 1129 cells/uL (ref 850–3900)
MCH: 27.6 pg (ref 27.0–33.0)
MCHC: 32.9 g/dL (ref 32.0–36.0)
MCV: 84 fL (ref 80.0–100.0)
MPV: 11.3 fL (ref 7.5–12.5)
Monocytes Relative: 6.3 %
Neutro Abs: 5080 cells/uL (ref 1500–7800)
Neutrophils Relative %: 74.7 %
Platelets: 203 10*3/uL (ref 140–400)
RBC: 5.32 10*6/uL — ABNORMAL HIGH (ref 3.80–5.10)
RDW: 13.8 % (ref 11.0–15.0)
Total Lymphocyte: 16.6 %
WBC: 6.8 10*3/uL (ref 3.8–10.8)

## 2019-04-10 LAB — SEDIMENTATION RATE: Sed Rate: 11 mm/h (ref 0–30)

## 2019-04-10 LAB — MAGNESIUM: Magnesium: 1.5 mg/dL (ref 1.5–2.5)

## 2019-04-10 LAB — C-REACTIVE PROTEIN: CRP: 6.2 mg/L (ref <8.0)

## 2019-04-10 LAB — RHEUMATOID FACTOR: Rheumatoid fact SerPl-aCnc: <14 IU/mL (ref <14)

## 2019-04-10 LAB — CK: Total CK: 72 U/L (ref 29–143)

## 2019-04-10 LAB — ANA: Anti Nuclear Antibody (ANA): NEGATIVE

## 2019-04-14 ENCOUNTER — Other Ambulatory Visit: Payer: Self-pay

## 2019-04-14 ENCOUNTER — Other Ambulatory Visit: Payer: Self-pay | Admitting: Physician Assistant

## 2019-04-14 ENCOUNTER — Encounter: Payer: Self-pay | Admitting: Physician Assistant

## 2019-04-14 ENCOUNTER — Ambulatory Visit (INDEPENDENT_AMBULATORY_CARE_PROVIDER_SITE_OTHER): Payer: Medicare PPO

## 2019-04-14 DIAGNOSIS — E876 Hypokalemia: Secondary | ICD-10-CM

## 2019-04-14 DIAGNOSIS — M5412 Radiculopathy, cervical region: Secondary | ICD-10-CM

## 2019-04-14 DIAGNOSIS — M542 Cervicalgia: Secondary | ICD-10-CM | POA: Diagnosis not present

## 2019-04-14 DIAGNOSIS — R7401 Elevation of levels of liver transaminase levels: Secondary | ICD-10-CM

## 2019-04-14 MED ORDER — POTASSIUM CHLORIDE CRYS ER 20 MEQ PO TBCR: 20.0000 meq | EXTENDED_RELEASE_TABLET | Freq: Two times a day (BID) | ORAL | 1 refills | Status: DC

## 2019-04-15 ENCOUNTER — Other Ambulatory Visit: Payer: Self-pay | Admitting: Physician Assistant

## 2019-04-15 ENCOUNTER — Encounter: Payer: Self-pay | Admitting: Physician Assistant

## 2019-04-15 DIAGNOSIS — E1122 Type 2 diabetes mellitus with diabetic chronic kidney disease: Secondary | ICD-10-CM | POA: Insufficient documentation

## 2019-04-15 DIAGNOSIS — N183 Chronic kidney disease, stage 3 unspecified: Secondary | ICD-10-CM

## 2019-04-15 DIAGNOSIS — I152 Hypertension secondary to endocrine disorders: Secondary | ICD-10-CM

## 2019-04-15 DIAGNOSIS — E1159 Type 2 diabetes mellitus with other circulatory complications: Secondary | ICD-10-CM

## 2019-04-15 HISTORY — DX: Chronic kidney disease, stage 3 unspecified: N18.30

## 2019-04-15 HISTORY — DX: Type 2 diabetes mellitus with diabetic chronic kidney disease: E11.22

## 2019-04-15 MED ORDER — LISINOPRIL 5 MG PO TABS: 5.0000 mg | ORAL_TABLET | ORAL | 0 refills | Status: DC

## 2019-04-16 ENCOUNTER — Encounter: Payer: Self-pay | Admitting: Sports Medicine

## 2019-04-16 ENCOUNTER — Ambulatory Visit (INDEPENDENT_AMBULATORY_CARE_PROVIDER_SITE_OTHER): Payer: Medicare PPO | Admitting: Sports Medicine

## 2019-04-16 DIAGNOSIS — M5030 Other cervical disc degeneration, unspecified cervical region: Secondary | ICD-10-CM

## 2019-04-16 HISTORY — DX: Other cervical disc degeneration, unspecified cervical region: M50.30

## 2019-04-16 MED ORDER — NAPROXEN 500 MG PO TABS: 500.0000 mg | ORAL_TABLET | Freq: Two times a day (BID) | ORAL | 3 refills | Status: DC

## 2019-04-16 NOTE — Assessment & Plan Note (Signed)
C5-C6 and C6-C7 DDD, worst at C6-C7 with moderate spinal stenosis, bilateral foraminal stenosis. 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 We discussed the treatment protocol, starting conservatively with a gentle up taper of Lyrica given that the diligence of ramping up to the maximum dose. In 2 weeks if insufficient relief with Lyrica 50 twice a day we will go up to 75 twice a day. I am also going to add naproxen 500 twice daily, formal physical therapy. There is also an element of myofascial pain syndrome/fibromyalgia which will be improved with a gradual up taper of Lyrica as well as naproxen. Return to see me in 6 weeks.

## 2019-04-16 NOTE — Progress Notes (Signed)
Subjective:    CC: Neck pain  HPI: This is a pleasant 63 year old female, she has a history of a cerebral aneurysm post clipping, followed by coiling.  She did have some residual neurologic deficits including visual field loss as well as word finding difficulties.  Over the past several years she is also developed pain in her neck with radiation down the right hand, arm, with mild weakness in the right hand, dropping objects.  Right periscapular symptoms.  Really does not have much in terms of left-sided radicular symptoms.  Symptoms are moderate, persistent, localized without radiation.    I reviewed the past medical history, family history, social history, surgical history, and allergies today and no changes were needed.  Please see the problem list section below in epic for further details.  Past Medical History: Past Medical History:  Diagnosis Date  . Anxiety   . Arthritis   . Asthma   . Cerebral aneurysm without rupture   . Chronic kidney disease   . CKD stage 3 due to type 2 diabetes mellitus (HCC) 04/15/2019  . Depression   . Diabetes mellitus without complication (HCC)   . Fibromyalgia   . Former smoker   . GERD (gastroesophageal reflux disease)   . Heart murmur   . History of chicken pox   . History of colon polyps   . Hypertension   . IBS (irritable bowel syndrome)   . Legally blind   . Migraines    Past Surgical History: Past Surgical History:  Procedure Laterality Date  . ABDOMINAL HYSTERECTOMY  2012  . APPENDECTOMY  1972  . CEREBRAL ANEURYSM REPAIR  2004   x 2  . CHOLECYSTECTOMY  2015  . MENISCUS REPAIR Bilateral    Social History: Social History   Socioeconomic History  . Marital status: Married    Spouse name: Jonny RuizJohn  . Number of children: 3  . Years of education: Not on file  . Highest education level: Associate degree: academic program  Occupational History  . Occupation: disabled  Social Needs  . Financial resource strain: Not on file  . Food  insecurity:    Worry: Not on file    Inability: Not on file  . Transportation needs:    Medical: Not on file    Non-medical: Not on file  Tobacco Use  . Smoking status: Former Smoker    Packs/day: 1.00    Years: 3.00    Pack years: 3.00    Types: Cigarettes    Last attempt to quit: 09/04/1989    Years since quitting: 29.6  . Smokeless tobacco: Never Used  Substance and Sexual Activity  . Alcohol use: Never    Frequency: Never  . Drug use: Never  . Sexual activity: Not Currently  Lifestyle  . Physical activity:    Days per week: Not on file    Minutes per session: Not on file  . Stress: Not on file  Relationships  . Social connections:    Talks on phone: Not on file    Gets together: Not on file    Attends religious service: Not on file    Active member of club or organization: Not on file    Attends meetings of clubs or organizations: Not on file    Relationship status: Not on file  Other Topics Concern  . Not on file  Social History Narrative   Patient is right-handed. She lives with her husband in a 2 story house. She walks the dog daily for  exercise. She drinks I cup of coffee and 3 glasses of unsweet tea.   Family History: Family History  Problem Relation Age of Onset  . Alcohol abuse Mother   . Diabetes Mother   . Hypertension Mother   . Kidney disease Mother   . COPD Father   . Diabetes Father   . Early death Father   . Heart disease Father   . Hyperlipidemia Father   . Hypertension Father   . Diabetes Sister   . Hypertension Sister   . Breast cancer Sister   . Cancer Maternal Grandmother   . Alcohol abuse Maternal Grandfather   . Cancer Maternal Grandfather   . Cancer Paternal Grandmother   . Cancer Paternal Grandfather   . Diabetes Sister   . Hypertension Sister   . Kidney disease Sister    Allergies: No Known Allergies Medications: See med rec.  Review of Systems: No fevers, chills, night sweats, weight loss, chest pain, or shortness of  breath.   Objective:    General: Well Developed, well nourished, and in no acute distress.  Neuro: Alert and oriented x3, extra-ocular muscles intact, sensation grossly intact.  HEENT: Normocephalic, atraumatic, pupils equal round reactive to light, neck supple, no masses, no lymphadenopathy, thyroid nonpalpable.  Skin: Warm and dry, no rashes. Cardiac: Regular rate and rhythm, no murmurs rubs or gallops, no lower extremity edema.  Respiratory: Clear to auscultation bilaterally. Not using accessory muscles, speaking in full sentences. 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  Impression and Recommendations:    DDD (degenerative disc disease), cervical C5-C6 and C6-C7 DDD, worst at C6-C7 with moderate spinal stenosis, bilateral foraminal stenosis. 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 We discussed the treatment protocol, starting conservatively with a gentle up taper of Lyrica given that the diligence of ramping up to the maximum dose. In 2 weeks if insufficient relief with Lyrica 50 twice a day we will go up to 75 twice a day. I am also going to add naproxen 500 twice daily, formal physical therapy. There is also an element of myofascial pain syndrome/fibromyalgia which will be improved with a gradual up taper of Lyrica as well as naproxen. Return to see me in 6 weeks.   ___________________________________________ Ihor Austin. Benjamin Stain, M.D., ABFM., CAQSM. Primary Care and Sports Medicine [facility]  Adjunct  Professor of Family Medicine  [employer]

## 2019-04-22 ENCOUNTER — Other Ambulatory Visit: Payer: Self-pay

## 2019-04-22 ENCOUNTER — Other Ambulatory Visit: Payer: Self-pay | Admitting: Physician Assistant

## 2019-04-22 DIAGNOSIS — G894 Chronic pain syndrome: Secondary | ICD-10-CM

## 2019-04-22 DIAGNOSIS — E876 Hypokalemia: Secondary | ICD-10-CM

## 2019-04-22 DIAGNOSIS — M791 Myalgia, unspecified site: Secondary | ICD-10-CM

## 2019-04-22 DIAGNOSIS — R7401 Elevation of levels of liver transaminase levels: Secondary | ICD-10-CM

## 2019-04-23 ENCOUNTER — Encounter: Payer: Self-pay | Admitting: Physician Assistant

## 2019-04-23 ENCOUNTER — Telehealth (INDEPENDENT_AMBULATORY_CARE_PROVIDER_SITE_OTHER): Payer: Medicare PPO | Admitting: Physician Assistant

## 2019-04-23 DIAGNOSIS — R7989 Other specified abnormal findings of blood chemistry: Secondary | ICD-10-CM

## 2019-04-23 DIAGNOSIS — M7918 Myalgia, other site: Secondary | ICD-10-CM | POA: Insufficient documentation

## 2019-04-23 DIAGNOSIS — R7401 Elevation of levels of liver transaminase levels: Secondary | ICD-10-CM

## 2019-04-23 DIAGNOSIS — G894 Chronic pain syndrome: Secondary | ICD-10-CM

## 2019-04-23 DIAGNOSIS — E1122 Type 2 diabetes mellitus with diabetic chronic kidney disease: Secondary | ICD-10-CM

## 2019-04-23 DIAGNOSIS — M791 Myalgia, unspecified site: Secondary | ICD-10-CM

## 2019-04-23 DIAGNOSIS — M5030 Other cervical disc degeneration, unspecified cervical region: Secondary | ICD-10-CM | POA: Diagnosis not present

## 2019-04-23 DIAGNOSIS — N183 Chronic kidney disease, stage 3 unspecified: Secondary | ICD-10-CM

## 2019-04-23 DIAGNOSIS — R740 Nonspecific elevation of levels of transaminase and lactic acid dehydrogenase [LDH]: Secondary | ICD-10-CM

## 2019-04-23 HISTORY — DX: Myalgia, unspecified site: M79.10

## 2019-04-23 HISTORY — DX: Chronic pain syndrome: G89.4

## 2019-04-23 LAB — BASIC METABOLIC PANEL WITH GFR
BUN/Creatinine Ratio: 20 (calc) (ref 6–22)
BUN: 22 mg/dL (ref 7–25)
CO2: 26 mmol/L (ref 20–32)
Calcium: 9.7 mg/dL (ref 8.6–10.4)
Chloride: 104 mmol/L (ref 98–110)
Creat: 1.09 mg/dL — ABNORMAL HIGH (ref 0.50–0.99)
GFR, Est African American: 63 mL/min/{1.73_m2} (ref >=60)
GFR, Est Non African American: 55 mL/min/{1.73_m2} — ABNORMAL LOW (ref >=60)
Glucose, Bld: 239 mg/dL — ABNORMAL HIGH (ref 65–99)
Potassium: 4.2 mmol/L (ref 3.5–5.3)
Sodium: 139 mmol/L (ref 135–146)

## 2019-04-23 LAB — HEPATIC FUNCTION PANEL
AG Ratio: 1.9 (calc) (ref 1.0–2.5)
ALT: 64 U/L — ABNORMAL HIGH (ref 6–29)
AST: 36 U/L — ABNORMAL HIGH (ref 10–35)
Albumin: 4.4 g/dL (ref 3.6–5.1)
Alkaline phosphatase (APISO): 99 U/L (ref 37–153)
Bilirubin, Direct: 0.1 mg/dL (ref 0.0–0.2)
Globulin: 2.3 g/dL (calc) (ref 1.9–3.7)
Indirect Bilirubin: 0.4 mg/dL (calc) (ref 0.2–1.2)
Total Bilirubin: 0.5 mg/dL (ref 0.2–1.2)
Total Protein: 6.7 g/dL (ref 6.1–8.1)

## 2019-04-23 MED ORDER — PREGABALIN 75 MG PO CAPS: 75.0000 mg | ORAL_CAPSULE | Freq: Two times a day (BID) | ORAL | 1 refills | Status: DC

## 2019-04-23 MED ORDER — NAPROXEN 500 MG PO TABS: 250.0000 mg | ORAL_TABLET | Freq: Two times a day (BID) | ORAL | 3 refills | Status: DC | PRN

## 2019-04-23 NOTE — Progress Notes (Signed)
Virtual Visit via Telephone Note  I connected with Cassandra Reid on 04/23/19 at 11:10 AM EDT by telephone and verified that I am speaking with the correct person using two identifiers.   I discussed the limitations, risks, security and privacy concerns of performing an evaluation and management service by telephone and the availability of in person appointments. I also discussed with the patient that there may be a patient responsible charge related to this service. The patient expressed understanding and agreed to proceed.   History of Present Illness: HPI:                                                                Cassandra Reid is a 63 y.o. female   CC: follow-up MyChart questions regarding lab results  Patient was started on Lyrica 50 mg twice a day for fibromyalgia about 3 weeks ago on Apr 09, 2019. Has noticed a mild improvement in her overall muscle pain symptoms.  Denies any adverse effects of the medication.  She was recently evaluated by sports medicine, Dr. Dianah Field on 04/16/2019 for cervical degenerative disc disease with C6 radicular symptoms.  He added naproxen 500 mg twice daily and physical therapy. She states naproxen has been helpful for her neck pain.  She recently repeated her lab work for transaminitis and CKD which showed a mild increase in her serum creatinine since starting the naproxen.   Past Medical History:  Diagnosis Date  . Anxiety   . Arthritis   . Asthma   . Cerebral aneurysm without rupture   . Chronic kidney disease   . CKD stage 3 due to type 2 diabetes mellitus ([place]) 04/15/2019  . Depression   . Diabetes mellitus without complication ([place])   . Fibromyalgia   . Former smoker   . GERD (gastroesophageal reflux disease)   . Heart murmur   . History of chicken pox   . History of colon polyps   . Hypertension   . IBS (irritable bowel syndrome)   . Legally blind   . Migraines    Past Surgical History:  Procedure Laterality Date  .  ABDOMINAL HYSTERECTOMY  2012  . APPENDECTOMY  1972  . CEREBRAL ANEURYSM REPAIR  2004   x 2  . CHOLECYSTECTOMY  2015  . MENISCUS REPAIR Bilateral    Social History   Tobacco Use  . Smoking status: Former Smoker    Packs/day: 1.00    Years: 3.00    Pack years: 3.00    Types: Cigarettes    Last attempt to quit: 09/04/1989    Years since quitting: 29.6  . Smokeless tobacco: Never Used  Substance Use Topics  . Alcohol use: Never    Frequency: Never   family history includes Alcohol abuse in her maternal grandfather and mother; Breast cancer in her sister; COPD in her father; Cancer in her maternal grandfather, maternal grandmother, paternal grandfather, and paternal grandmother; Diabetes in her father, mother, sister, and sister; Early death in her father; Heart disease in her father; Hyperlipidemia in her father; Hypertension in her father, mother, sister, and sister; Kidney disease in her mother and sister.    ROS: negative except as noted in the HPI  Medications: Current Outpatient Medications  Medication Sig Dispense Refill  . ACCU-CHEK  FASTCLIX LANCETS MISC     . atorvastatin (LIPITOR) 40 MG tablet Take 1 tablet (40 mg total) by mouth at bedtime. 90 tablet 1  . blood glucose meter kit and supplies KIT Dispense based on patient and insurance preference. Use up to four times daily as directed. 1 each 0  . Blood Glucose Monitoring Suppl (ACCU-CHEK AVIVA PLUS) w/Device KIT 1 each by Does not apply route as directed. E11.22 1 kit 0  . chlorhexidine (PERIDEX) 0.12 % solution   0  . furosemide (LASIX) 40 MG tablet Take 40 mg by mouth daily.     Marland Kitchen glucose blood (ACCU-CHEK AVIVA PLUS) test strip 1 each by Other route 4 (four) times daily as needed for other. Use as instructed 200 each 5  . Insulin NPH Isophane & Regular (NOVOLIN 70/30 [place]) Inject 50 Units into the skin 2 (two) times daily.    . Insulin Pen Needle 31G X 6 MM MISC 1 Package by Does not apply route 2 (two) times daily. Use  as instructed to inject insulin 2 times daily 100 each 0  . lisinopril (ZESTRIL) 5 MG tablet Take 1 tablet (5 mg total) by mouth every morning. 90 tablet 0  . LORazepam (ATIVAN) 0.5 MG tablet Take 1 tablet (0.5 mg total) by mouth as needed. 30 tablet 0  . metFORMIN (GLUCOPHAGE-XR) 500 MG 24 hr tablet Take 1 tablet (500 mg total) by mouth 2 (two) times daily. 180 tablet 1  . metoprolol tartrate (LOPRESSOR) 25 MG tablet Take 25 mg by mouth 2 (two) times daily.     . naproxen (NAPROSYN) 500 MG tablet Take 1 tablet (500 mg total) by mouth 2 (two) times daily with a meal. 60 tablet 3  . potassium chloride SA (K-DUR) 20 MEQ tablet Take 1 tablet (20 mEq total) by mouth 2 (two) times daily. 180 tablet 1  . pregabalin (LYRICA) 50 MG capsule Take 1 capsule (50 mg total) by mouth 2 (two) times daily. 30 capsule 0   No current facility-administered medications for this visit.    No Known Allergies     Objective:  There were no vitals taken for this visit.   Results for orders placed or performed in visit on 04/22/19 (from the past 72 hour(s))  Hepatic function panel     Status: Abnormal   Collection Time: 04/22/19 10:33 AM  Result Value Ref Range   Total Protein 6.7 6.1 - 8.1 g/dL   Albumin 4.4 3.6 - 5.1 g/dL   Globulin 2.3 1.9 - 3.7 g/dL (calc)   AG Ratio 1.9 1.0 - 2.5 (calc)   Total Bilirubin 0.5 0.2 - 1.2 mg/dL   Bilirubin, Direct 0.1 0.0 - 0.2 mg/dL   Indirect Bilirubin 0.4 0.2 - 1.2 mg/dL (calc)   Alkaline phosphatase (APISO) 99 37 - 153 U/L   AST 36 (H) 10 - 35 U/L   ALT 64 (H) 6 - 29 U/L  BASIC METABOLIC PANEL WITH GFR     Status: Abnormal   Collection Time: 04/22/19 10:33 AM  Result Value Ref Range   Glucose, Bld 239 (H) 65 - 99 mg/dL    Comment: .            Fasting reference interval . For someone without known diabetes, a glucose value >125 mg/dL indicates that they may have diabetes and this should be confirmed with a follow-up test. .    BUN 22 7 - 25 mg/dL   Creat  1.09 (H) 0.50 - 0.99 mg/dL  Comment: For patients >42 years of age, the reference limit for Creatinine is approximately 13% higher for people identified as African-American. .    GFR, Est Non African American 55 (L) > OR = 60 mL/min/1.62m   GFR, Est African American 63 > OR = 60 mL/min/1.762m  BUN/Creatinine Ratio 20 6 - 22 (calc)   Sodium 139 135 - 146 mmol/L   Potassium 4.2 3.5 - 5.3 mmol/L   Chloride 104 98 - 110 mmol/L   CO2 26 20 - 32 mmol/L   Calcium 9.7 8.6 - 10.4 mg/dL   No results found. Estimated Creatinine Clearance: 54.9 mL/min (A) (by C-G formula based on SCr of 1.09 mg/dL (H)).    Assessment and Plan: [age].o. female with   .Diagnoses and all orders for this visit:  Myalgia -     pregabalin (LYRICA) 75 MG capsule; Take 1 capsule (75 mg total) by mouth 2 (two) times daily.  Chronic pain syndrome -     pregabalin (LYRICA) 75 MG capsule; Take 1 capsule (75 mg total) by mouth 2 (two) times daily.  DDD (degenerative disc disease), cervical -     naproxen (NAPROSYN) 500 MG tablet; Take 0.5-1 tablets (250-500 mg total) by mouth 2 (two) times daily as needed.  CKD stage 3 due to type 2 diabetes mellitus (HCC)  Elevated serum creatinine    Increasing Lyrica to 75 mg twice daily. Must be renally dosed. CrCl 54 I believe the bump in her serum creatinine is due to high-dose naproxen.  I advised her to reduce the dose to half a tablet every 12 hours as needed and limit use for moderate to severe pain rather than taking this twice a day scheduled Recommended that she use Tylenol arthritis strength first-line Recheck kidney function in 1 month   Follow Up Instructions:    I discussed the assessment and treatment plan with the patient. The patient was provided an opportunity to ask questions and all were answered. The patient agreed with the plan and demonstrated an understanding of the instructions.   The patient was advised to call back or seek an in-person  evaluation if the symptoms worsen or if the condition fails to improve as anticipated.  I provided 5-10 minutes of non-face-to-face time during this encounter.   Ch[place]rixie [place]redge[place]

## 2019-04-23 NOTE — Telephone Encounter (Signed)
Left a message for patient to call back to schedule a virtual visit at 11:30 today.

## 2019-04-27 ENCOUNTER — Encounter: Payer: Self-pay | Admitting: Physician Assistant

## 2019-04-27 ENCOUNTER — Other Ambulatory Visit: Payer: Self-pay | Admitting: Physician Assistant

## 2019-04-27 DIAGNOSIS — E1122 Type 2 diabetes mellitus with diabetic chronic kidney disease: Secondary | ICD-10-CM

## 2019-04-27 DIAGNOSIS — E1159 Type 2 diabetes mellitus with other circulatory complications: Secondary | ICD-10-CM

## 2019-04-27 DIAGNOSIS — I152 Hypertension secondary to endocrine disorders: Secondary | ICD-10-CM

## 2019-04-27 MED ORDER — LORAZEPAM 0.5 MG PO TABS: 0.5000 mg | ORAL_TABLET | ORAL | 0 refills | Status: DC | PRN

## 2019-05-04 ENCOUNTER — Other Ambulatory Visit: Payer: Self-pay | Admitting: Physician Assistant

## 2019-05-04 DIAGNOSIS — F32A Depression, unspecified: Secondary | ICD-10-CM

## 2019-05-04 DIAGNOSIS — F329 Major depressive disorder, single episode, unspecified: Secondary | ICD-10-CM

## 2019-05-04 DIAGNOSIS — F419 Anxiety disorder, unspecified: Secondary | ICD-10-CM

## 2019-05-04 MED ORDER — LORAZEPAM 0.5 MG PO TABS: 0.5000 mg | ORAL_TABLET | Freq: Every day | ORAL | 0 refills | Status: DC | PRN

## 2019-05-05 ENCOUNTER — Encounter: Payer: Self-pay | Admitting: Physician Assistant

## 2019-05-07 ENCOUNTER — Encounter: Payer: Self-pay | Admitting: Physician Assistant

## 2019-05-19 ENCOUNTER — Encounter: Payer: Self-pay | Admitting: Sports Medicine

## 2019-05-28 ENCOUNTER — Ambulatory Visit: Payer: Medicare PPO | Admitting: Sports Medicine

## 2019-06-17 ENCOUNTER — Encounter: Payer: Self-pay | Admitting: Physician Assistant

## 2019-06-17 DIAGNOSIS — G894 Chronic pain syndrome: Secondary | ICD-10-CM

## 2019-06-17 DIAGNOSIS — M791 Myalgia, unspecified site: Secondary | ICD-10-CM

## 2019-06-18 MED ORDER — PREGABALIN 100 MG PO CAPS: 100.0000 mg | ORAL_CAPSULE | Freq: Two times a day (BID) | ORAL | 0 refills | Status: DC

## 2019-07-07 ENCOUNTER — Encounter: Payer: Self-pay | Admitting: Physician Assistant

## 2019-07-07 DIAGNOSIS — E1159 Type 2 diabetes mellitus with other circulatory complications: Secondary | ICD-10-CM

## 2019-07-07 DIAGNOSIS — E1122 Type 2 diabetes mellitus with diabetic chronic kidney disease: Secondary | ICD-10-CM

## 2019-07-07 DIAGNOSIS — I152 Hypertension secondary to endocrine disorders: Secondary | ICD-10-CM

## 2019-07-07 NOTE — Telephone Encounter (Signed)
Previous MyChart note from patient: "My prev Dr in [place] had prescribed lactilose  10gm/41ml sol for the occasional issues I have from some of the other meds.     It was filled through [facility].  I've run out and didn't realize I had no refills from him.  Is it possible to get it refilled now though?   I ran out.       Prev Dr was Dr Laureen Abrahams if that helps.     Thank you.     Oh the lisinopril I think I need refill for also.   Sorry.    It's getting confusing to keep track"   I had patient send attachments of old bottle to verify dose. RX was last written by Dr Nicholaus Bloom and she now is requesting refill from you.   Please advise if appropriate and send if ok

## 2019-07-08 MED ORDER — LISINOPRIL 5 MG PO TABS: 5.0000 mg | ORAL_TABLET | ORAL | 1 refills | Status: DC

## 2019-07-16 ENCOUNTER — Ambulatory Visit: Payer: Medicare PPO

## 2019-07-17 ENCOUNTER — Other Ambulatory Visit: Payer: Self-pay

## 2019-07-17 ENCOUNTER — Ambulatory Visit: Payer: Medicare PPO

## 2019-07-17 ENCOUNTER — Encounter: Payer: Self-pay | Admitting: Internal Medicine

## 2019-07-17 MED ORDER — INSULIN PEN NEEDLE 31G X 6 MM MISC: 1.0000 | Freq: Two times a day (BID) | 0 refills | Status: DC

## 2019-07-24 ENCOUNTER — Encounter: Payer: Self-pay | Admitting: Physician Assistant

## 2019-07-28 ENCOUNTER — Ambulatory Visit: Payer: Medicare PPO | Admitting: Physician Assistant

## 2019-07-28 ENCOUNTER — Encounter: Payer: Self-pay | Admitting: Physician Assistant

## 2019-07-28 DIAGNOSIS — M791 Myalgia, unspecified site: Secondary | ICD-10-CM

## 2019-07-28 DIAGNOSIS — G894 Chronic pain syndrome: Secondary | ICD-10-CM

## 2019-07-28 MED ORDER — PREGABALIN 100 MG PO CAPS: 100.0000 mg | ORAL_CAPSULE | Freq: Two times a day (BID) | ORAL | 0 refills | Status: DC

## 2019-07-30 ENCOUNTER — Encounter: Payer: Self-pay | Admitting: Physician Assistant

## 2019-07-30 ENCOUNTER — Ambulatory Visit (INDEPENDENT_AMBULATORY_CARE_PROVIDER_SITE_OTHER): Payer: Medicare PPO | Admitting: Physician Assistant

## 2019-07-30 ENCOUNTER — Telehealth: Payer: Self-pay | Admitting: Physician Assistant

## 2019-07-30 VITALS — BP 147/76 | HR 72 | Temp 97.5°F | Wt 184.0 lb

## 2019-07-30 DIAGNOSIS — R7989 Other specified abnormal findings of blood chemistry: Secondary | ICD-10-CM | POA: Diagnosis not present

## 2019-07-30 DIAGNOSIS — M797 Fibromyalgia: Secondary | ICD-10-CM

## 2019-07-30 DIAGNOSIS — I10 Essential (primary) hypertension: Secondary | ICD-10-CM

## 2019-07-30 DIAGNOSIS — E785 Hyperlipidemia, unspecified: Secondary | ICD-10-CM

## 2019-07-30 DIAGNOSIS — F419 Anxiety disorder, unspecified: Secondary | ICD-10-CM | POA: Diagnosis not present

## 2019-07-30 DIAGNOSIS — E1121 Type 2 diabetes mellitus with diabetic nephropathy: Secondary | ICD-10-CM

## 2019-07-30 DIAGNOSIS — E1169 Type 2 diabetes mellitus with other specified complication: Secondary | ICD-10-CM

## 2019-07-30 DIAGNOSIS — M791 Myalgia, unspecified site: Secondary | ICD-10-CM

## 2019-07-30 DIAGNOSIS — F32A Depression, unspecified: Secondary | ICD-10-CM

## 2019-07-30 DIAGNOSIS — E1159 Type 2 diabetes mellitus with other circulatory complications: Secondary | ICD-10-CM | POA: Diagnosis not present

## 2019-07-30 DIAGNOSIS — F329 Major depressive disorder, single episode, unspecified: Secondary | ICD-10-CM

## 2019-07-30 DIAGNOSIS — I152 Hypertension secondary to endocrine disorders: Secondary | ICD-10-CM

## 2019-07-30 DIAGNOSIS — E1142 Type 2 diabetes mellitus with diabetic polyneuropathy: Secondary | ICD-10-CM

## 2019-07-30 DIAGNOSIS — Z794 Long term (current) use of insulin: Secondary | ICD-10-CM

## 2019-07-30 DIAGNOSIS — G894 Chronic pain syndrome: Secondary | ICD-10-CM

## 2019-07-30 DIAGNOSIS — G629 Polyneuropathy, unspecified: Secondary | ICD-10-CM | POA: Diagnosis not present

## 2019-07-30 DIAGNOSIS — R7401 Elevation of levels of liver transaminase levels: Secondary | ICD-10-CM

## 2019-07-30 DIAGNOSIS — R740 Nonspecific elevation of levels of transaminase and lactic acid dehydrogenase [LDH]: Secondary | ICD-10-CM | POA: Diagnosis not present

## 2019-07-30 MED ORDER — LORAZEPAM 0.5 MG PO TABS: 0.5000 mg | ORAL_TABLET | Freq: Every day | ORAL | 0 refills | Status: DC | PRN

## 2019-07-30 MED ORDER — NAPROXEN 375 MG PO TABS: 375.0000 mg | ORAL_TABLET | Freq: Two times a day (BID) | ORAL | 2 refills | Status: DC | PRN

## 2019-07-30 MED ORDER — METFORMIN HCL ER 750 MG PO TB24: 750.0000 mg | ORAL_TABLET | Freq: Two times a day (BID) | ORAL | 0 refills | Status: DC

## 2019-07-30 NOTE — Telephone Encounter (Signed)
Please contact patient to schedule office visit (in-person) with Dr. Darene Lamer for neck pain and weakness

## 2019-07-30 NOTE — Telephone Encounter (Signed)
scheduled

## 2019-07-30 NOTE — Progress Notes (Signed)
Virtual Visit via Video Note  I connected with Cassandra Reid on 08/19/19 at  8:50 AM EDT by a video enabled telemedicine application and verified that I am speaking with the correct person using two identifiers.   I discussed the limitations of evaluation and management by telemedicine and the availability of in person appointments. The patient expressed understanding and agreed to proceed.  History of Present Illness: HPI:                                                                Cassandra Reid is a 63 y.o. female   CC: fibromyalgia follow-up  She states she has cut back on Naproxen and is only using it about once a week for severe pain She reports Lyrica was initially helpful in combination with Naproxen, but after about a month it didn't seem to be helpful any more She has also tried relaxation/breathing exercises to control her pain She rates her pain as a 6/10 most days Pain is worse in the evening She reports she is having migratory muscle pain described as a "deep ache", waxing and waning, that "jumps" from her shin to thigh to chest to back.  She continues to have fairly persistent neck pain. MRI 04/14/19 showed cervical spondylosis and DDD with impingement at C6-7 She is unable to drive and has been unable to go to formal physical therapy Reports at times she feels like "something is biting me" and other times feels like "a giant knot" in her upper back just below the shoulder blades. She endorses pain with overhead lifting. She states she avoids using nice plates or holding her grandchild due to weakness and dropping things, this has been gradually worsening for several months. She states at times her feet "feel dead."    She is taking Novolin 70/30 50U bid  She reports her home blood sugar is running 150-250 mg  Neuropathy work-up Negative HIV, ANA, ESR, CRP, RF, CK     Past Medical History:  Diagnosis Date  . Anxiety   . Arthritis   . Asthma   . Cerebral  aneurysm without rupture   . Chronic kidney disease   . CKD stage 3 due to type 2 diabetes mellitus ([place]) 04/15/2019  . Depression   . Diabetes mellitus without complication ([place])   . Fibromyalgia   . Former smoker   . GERD (gastroesophageal reflux disease)   . Heart murmur   . History of chicken pox   . History of colon polyps   . Hypertension   . IBS (irritable bowel syndrome)   . Legally blind   . Migraines    Past Surgical History:  Procedure Laterality Date  . ABDOMINAL HYSTERECTOMY  2012  . APPENDECTOMY  1972  . CEREBRAL ANEURYSM REPAIR  2004   x 2  . CHOLECYSTECTOMY  2015  . MENISCUS REPAIR Bilateral    Social History   Tobacco Use  . Smoking status: Former Smoker    Packs/day: 1.00    Years: 3.00    Pack years: 3.00    Types: Cigarettes    Quit date: 09/04/1989    Years since quitting: 29.9  . Smokeless tobacco: Never Used  Substance Use Topics  . Alcohol use: Never    Frequency:  Never   family history includes Alcohol abuse in her maternal grandfather and mother; Breast cancer in her sister; COPD in her father; Cancer in her maternal grandfather, maternal grandmother, paternal grandfather, and paternal grandmother; Diabetes in her father, mother, sister, and sister; Early death in her father; Heart disease in her father; Hyperlipidemia in her father; Hypertension in her father, mother, sister, and sister; Kidney disease in her mother and sister.    ROS: negative except as noted in the HPI  Medications: Current Outpatient Medications  Medication Sig Dispense Refill  . ACCU-CHEK FASTCLIX LANCETS MISC     . atorvastatin (LIPITOR) 40 MG tablet Take 1 tablet (40 mg total) by mouth at bedtime. 90 tablet 1  . blood glucose meter kit and supplies KIT Dispense based on patient and insurance preference. Use up to four times daily as directed. 1 each 0  . Blood Glucose Monitoring Suppl (ACCU-CHEK AVIVA PLUS) w/Device KIT 1 each by Does not apply route as directed.  E11.22 1 kit 0  . chlorhexidine (PERIDEX) 0.12 % solution   0  . furosemide (LASIX) 40 MG tablet Take 40 mg by mouth daily.     Marland Kitchen lactulose (CHRONULAC) 10 GM/15ML solution Take by mouth.    Marland Kitchen lisinopril (ZESTRIL) 5 MG tablet Take 1 tablet (5 mg total) by mouth every morning. 90 tablet 1  . LORazepam (ATIVAN) 0.5 MG tablet Take 1 tablet (0.5 mg total) by mouth daily as needed for anxiety. 30 tablet 0  . metFORMIN (GLUCOPHAGE-XR) 750 MG 24 hr tablet Take 1 tablet (750 mg total) by mouth 2 (two) times daily with a meal. 180 tablet 0  . metoprolol tartrate (LOPRESSOR) 25 MG tablet Take 25 mg by mouth 2 (two) times daily.     . potassium chloride SA (K-DUR) 20 MEQ tablet Take 1 tablet (20 mEq total) by mouth 2 (two) times daily. 180 tablet 1  . pregabalin (LYRICA) 100 MG capsule Take 1 capsule (100 mg total) by mouth 2 (two) times daily. 60 capsule 2  . DULoxetine (CYMBALTA) 30 MG capsule Take 1 capsule (30 mg total) by mouth daily. 30 capsule 2  . glucose blood (ACCU-CHEK AVIVA PLUS) test strip 1 each by Other route 4 (four) times daily as needed for other. Use as instructed 200 each 5  . Insulin Glargine, 2 Unit Dial, (TOUJEO MAX SOLOSTAR) 300 UNIT/ML SOPN Inject 80 Units into the skin at bedtime. 8 pen 0  . Insulin Pen Needle 32G X 4 MM MISC For subcutaneous use with insulin 90 each 0  . naproxen (NAPROSYN) 375 MG tablet Take 1 tablet (375 mg total) by mouth 2 (two) times daily as needed for moderate pain. Limit use to 3 days per week 60 tablet 2   No current facility-administered medications for this visit.    No Known Allergies     Objective:  BP (!) 147/76   Pulse 72   Temp (!) 97.5 F (36.4 C) (Oral)   Wt 184 lb (83.5 kg)   BMI 33.65 kg/m   Wt Readings from Last 3 Encounters:  07/30/19 184 lb (83.5 kg)  04/16/19 188 lb (85.3 kg)  04/09/19 189 lb (85.7 kg)   Temp Readings from Last 3 Encounters:  07/30/19 (!) 97.5 F (36.4 C) (Oral)  04/09/19 98.1 F (36.7 C)  09/26/18 98.1  F (36.7 C) (Oral)   BP Readings from Last 3 Encounters:  07/30/19 (!) 147/76  04/16/19 (!) 156/74  04/09/19 135/74   Pulse Readings from  Last 3 Encounters:  07/30/19 72  04/16/19 98  04/09/19 72    Gen:  alert, not ill-appearing, no distress, appropriate for age 2: head normocephalic without obvious abnormality, conjunctiva and cornea clear, trachea midline Pulm: Normal work of breathing, normal phonation Neuro: alert and oriented x 3 Psych: cooperative, euthymic mood, affect mood-congruent, speech is articulate, normal rate and volume; thought processes clear and goal-directed, normal judgment, good insight   Lab Results  Component Value Date   VITAMINB12 433 07/31/2019   No results found for: TSH No results found for: RPR  Lab Results  Component Value Date   CHOL 120 07/31/2019   HDL 42 (L) 07/31/2019   LDLCALC 48 07/31/2019   TRIG 242 (H) 07/31/2019   CHOLHDL 2.9 07/31/2019      No results found for this or any previous visit (from the past 72 hour(s)). No results found.    Assessment and Plan: 63 y.o. female with   .Aline was seen today for medication management.  Diagnoses and all orders for this visit:  Type 2 diabetes mellitus with diabetic polyneuropathy, with long-term current use of insulin (HCC) -     metFORMIN (GLUCOPHAGE-XR) 750 MG 24 hr tablet; Take 1 tablet (750 mg total) by mouth 2 (two) times daily with a meal. -     COMPLETE METABOLIC PANEL WITH GFR -     Hemoglobin A1c -     B12 and Folate Panel -     TSH + free T4 -     Insulin Glargine, 2 Unit Dial, (TOUJEO MAX SOLOSTAR) 300 UNIT/ML SOPN; Inject 80 Units into the skin at bedtime. -     Insulin Pen Needle 32G X 4 MM MISC; For subcutaneous use with insulin  Anxiety and depression -     LORazepam (ATIVAN) 0.5 MG tablet; Take 1 tablet (0.5 mg total) by mouth daily as needed for anxiety.  Elevated serum creatinine -     COMPLETE METABOLIC PANEL WITH GFR  Fibromyalgia -      naproxen (NAPROSYN) 375 MG tablet; Take 1 tablet (375 mg total) by mouth 2 (two) times daily as needed for moderate pain. Limit use to 3 days per week -     DULoxetine (CYMBALTA) 30 MG capsule; Take 1 capsule (30 mg total) by mouth daily.  Polyneuropathy -     COMPLETE METABOLIC PANEL WITH GFR -     Hemoglobin A1c -     B12 and Folate Panel -     TSH + free T4 -     RPR -     DULoxetine (CYMBALTA) 30 MG capsule; Take 1 capsule (30 mg total) by mouth daily.  Transaminitis -     COMPLETE METABOLIC PANEL WITH GFR -     Hemoglobin A1c -     B12 and Folate Panel -     TSH + free T4 -     RPR  Dyslipidemia associated with type 2 diabetes mellitus ([place]) -     Lipid Profile  Hypertension associated with diabetes ([place]) -     COMPLETE METABOLIC PANEL WITH GFR -     TSH + free T4  Diabetic nephropathy associated with type 2 diabetes mellitus (HCC) -     COMPLETE METABOLIC PANEL WITH GFR -     Urine Microalbumin w/creat. ratio  Myalgia -     pregabalin (LYRICA) 100 MG capsule; Take 1 capsule (100 mg total) by mouth 2 (two) times daily.  Chronic pain syndrome -  pregabalin (LYRICA) 100 MG capsule; Take 1 capsule (100 mg total) by mouth 2 (two) times daily.  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 Re-start Naproxen at reduced dose of 375 mg bid prn with close monitoring of renal function Add Duloxetine 30 mg QHS Diabetes is not well controlled according to home readings Discussed switching from Novolin 70/30 to Toujeo. Will base this on her A1C Fasting labs pending   Follow Up Instructions:    I discussed the assessment and treatment plan with the patient. The patient was provided an opportunity to ask questions and all were answered. The patient agreed with the plan and demonstrated an understanding of the instructions.   The patient was advised to call back or seek an in-person evaluation if the  symptoms worsen or if the condition fails to improve as anticipated.  I provided 26 minutes of non-face-to-face time during this encounter.   Trixie Dredge, [place]

## 2019-07-31 ENCOUNTER — Encounter: Payer: Self-pay | Admitting: Physician Assistant

## 2019-07-31 DIAGNOSIS — E1169 Type 2 diabetes mellitus with other specified complication: Secondary | ICD-10-CM | POA: Diagnosis not present

## 2019-07-31 DIAGNOSIS — E1121 Type 2 diabetes mellitus with diabetic nephropathy: Secondary | ICD-10-CM | POA: Diagnosis not present

## 2019-07-31 DIAGNOSIS — I10 Essential (primary) hypertension: Secondary | ICD-10-CM | POA: Diagnosis not present

## 2019-07-31 DIAGNOSIS — E785 Hyperlipidemia, unspecified: Secondary | ICD-10-CM | POA: Diagnosis not present

## 2019-07-31 DIAGNOSIS — Z794 Long term (current) use of insulin: Secondary | ICD-10-CM | POA: Diagnosis not present

## 2019-07-31 DIAGNOSIS — R7989 Other specified abnormal findings of blood chemistry: Secondary | ICD-10-CM | POA: Diagnosis not present

## 2019-07-31 DIAGNOSIS — E1142 Type 2 diabetes mellitus with diabetic polyneuropathy: Secondary | ICD-10-CM | POA: Diagnosis not present

## 2019-07-31 DIAGNOSIS — E1159 Type 2 diabetes mellitus with other circulatory complications: Secondary | ICD-10-CM | POA: Diagnosis not present

## 2019-07-31 DIAGNOSIS — R740 Nonspecific elevation of levels of transaminase and lactic acid dehydrogenase [LDH]: Secondary | ICD-10-CM | POA: Diagnosis not present

## 2019-07-31 DIAGNOSIS — Z79899 Other long term (current) drug therapy: Secondary | ICD-10-CM | POA: Diagnosis not present

## 2019-07-31 MED ORDER — DULOXETINE HCL 30 MG PO CPEP: 30.0000 mg | ORAL_CAPSULE | Freq: Every day | ORAL | 2 refills | Status: DC

## 2019-07-31 MED ORDER — PREGABALIN 100 MG PO CAPS: 100.0000 mg | ORAL_CAPSULE | Freq: Two times a day (BID) | ORAL | 2 refills | Status: DC

## 2019-08-03 ENCOUNTER — Encounter: Payer: Self-pay | Admitting: Physician Assistant

## 2019-08-03 LAB — B12 AND FOLATE PANEL
Folate: 21.8 ng/mL
Vitamin B-12: 433 pg/mL (ref 200–1100)

## 2019-08-03 LAB — COMPLETE METABOLIC PANEL WITH GFR
AG Ratio: 2 (calc) (ref 1.0–2.5)
ALT: 52 U/L — ABNORMAL HIGH (ref 6–29)
AST: 31 U/L (ref 10–35)
Albumin: 4.5 g/dL (ref 3.6–5.1)
Alkaline phosphatase (APISO): 100 U/L (ref 37–153)
BUN/Creatinine Ratio: 16 (calc) (ref 6–22)
BUN: 20 mg/dL (ref 7–25)
CO2: 24 mmol/L (ref 20–32)
Calcium: 9.7 mg/dL (ref 8.6–10.4)
Chloride: 102 mmol/L (ref 98–110)
Creat: 1.23 mg/dL — ABNORMAL HIGH (ref 0.50–0.99)
GFR, Est African American: 55 mL/min/{1.73_m2} — ABNORMAL LOW (ref >=60)
GFR, Est Non African American: 47 mL/min/{1.73_m2} — ABNORMAL LOW (ref >=60)
Globulin: 2.2 g/dL (calc) (ref 1.9–3.7)
Glucose, Bld: 283 mg/dL — ABNORMAL HIGH (ref 65–99)
Potassium: 4.5 mmol/L (ref 3.5–5.3)
Sodium: 138 mmol/L (ref 135–146)
Total Bilirubin: 0.5 mg/dL (ref 0.2–1.2)
Total Protein: 6.7 g/dL (ref 6.1–8.1)

## 2019-08-03 LAB — HEMOGLOBIN A1C
Hgb A1c MFr Bld: 9.1 % of total Hgb — ABNORMAL HIGH (ref <5.7)
Mean Plasma Glucose: 214 (calc)
eAG (mmol/L): 11.9 (calc)

## 2019-08-03 LAB — MICROALBUMIN / CREATININE URINE RATIO
Creatinine, Urine: 176 mg/dL (ref 20–275)
Microalb Creat Ratio: 16 mcg/mg creat (ref <30)
Microalb, Ur: 2.9 mg/dL

## 2019-08-03 LAB — LIPID PANEL
Cholesterol: 120 mg/dL (ref <200)
HDL: 42 mg/dL — ABNORMAL LOW (ref >=50)
LDL Cholesterol (Calc): 48 mg/dL (calc)
Non-HDL Cholesterol (Calc): 78 mg/dL (calc) (ref <130)
Total CHOL/HDL Ratio: 2.9 (calc) (ref <5.0)
Triglycerides: 242 mg/dL — ABNORMAL HIGH (ref <150)

## 2019-08-03 LAB — RPR: RPR Ser Ql: NONREACTIVE

## 2019-08-03 LAB — TSH+FREE T4: TSH W/REFLEX TO FT4: 1.46 mIU/L (ref 0.40–4.50)

## 2019-08-03 MED ORDER — INSULIN PEN NEEDLE 32G X 4 MM MISC: 0 refills | Status: DC

## 2019-08-03 MED ORDER — TOUJEO MAX SOLOSTAR 300 UNIT/ML ~~LOC~~ SOPN: 80.0000 [IU] | PEN_INJECTOR | Freq: Every day | SUBCUTANEOUS | 0 refills | Status: DC

## 2019-08-05 ENCOUNTER — Encounter: Payer: Self-pay | Admitting: Physician Assistant

## 2019-08-05 NOTE — Telephone Encounter (Signed)
See other mychart. Will check on patient assistance.

## 2019-08-06 ENCOUNTER — Encounter: Payer: Self-pay | Admitting: Physician Assistant

## 2019-08-11 ENCOUNTER — Ambulatory Visit: Payer: Medicare PPO | Admitting: Sports Medicine

## 2019-08-19 ENCOUNTER — Encounter: Payer: Self-pay | Admitting: Physician Assistant

## 2019-08-21 ENCOUNTER — Encounter: Payer: Self-pay | Admitting: Physician Assistant

## 2019-08-24 MED ORDER — TRESIBA FLEXTOUCH 200 UNIT/ML ~~LOC~~ SOPN: 80.0000 [IU] | PEN_INJECTOR | Freq: Every day | SUBCUTANEOUS | 1 refills | Status: DC

## 2019-08-24 NOTE — Telephone Encounter (Signed)
Patient is trying another medication. Closing encounter.

## 2019-09-02 ENCOUNTER — Encounter: Payer: Self-pay | Admitting: Osteopathic Medicine

## 2019-09-02 ENCOUNTER — Other Ambulatory Visit: Payer: Self-pay

## 2019-09-02 ENCOUNTER — Ambulatory Visit (INDEPENDENT_AMBULATORY_CARE_PROVIDER_SITE_OTHER): Payer: Medicare PPO | Admitting: Osteopathic Medicine

## 2019-09-02 VITALS — BP 153/77 | HR 73 | Temp 98.0°F | Wt 192.0 lb

## 2019-09-02 DIAGNOSIS — F419 Anxiety disorder, unspecified: Secondary | ICD-10-CM | POA: Diagnosis not present

## 2019-09-02 DIAGNOSIS — Z794 Long term (current) use of insulin: Secondary | ICD-10-CM | POA: Diagnosis not present

## 2019-09-02 DIAGNOSIS — F329 Major depressive disorder, single episode, unspecified: Secondary | ICD-10-CM | POA: Diagnosis not present

## 2019-09-02 DIAGNOSIS — E1142 Type 2 diabetes mellitus with diabetic polyneuropathy: Secondary | ICD-10-CM

## 2019-09-02 DIAGNOSIS — M797 Fibromyalgia: Secondary | ICD-10-CM

## 2019-09-02 DIAGNOSIS — F32A Depression, unspecified: Secondary | ICD-10-CM

## 2019-09-02 MED ORDER — SERTRALINE HCL 25 MG PO TABS: 25.0000 mg | ORAL_TABLET | Freq: Every day | ORAL | 2 refills | Status: DC

## 2019-09-02 NOTE — Patient Instructions (Addendum)
Plan:  Pain/Fibromyalgia:  Continue the Lyrica as you are taking it.  We might consider taking the naproxen daily and adding a medication to help reduce acid production in the stomach, this way we can prevent that medicine from causing stomach ulcers.  What I would like to try first is adding back Zoloft -this is an antidepressant/antianxiety medication that has also been shown to potentially help with pain.  I could not find any reason why this was stopped initially, so let us restarted and please let me know if you have any concerning side effects.  In the first few days, we might expect some minor dizziness, nausea, stomach cramping but this should not be severe and it should go away.  Diabetes:  Stop the Novolin.    Will start the Tresiba this evening. You will start with 80 Units around the same time every day. You will increase your dose by 2 Units every 3 days until your morning fasting sugar is between 80-130, then continue on that dose and let us know what dose that is when we next see you. Contact us for readings below 80 or above 200   Continue the metformin as you have been taking

## 2019-09-02 NOTE — Progress Notes (Signed)
HPI: Cassandra Reid is a 63 y.o. female who  has a past medical history of Anxiety, Arthritis, Asthma, Cerebral aneurysm without rupture, Chronic kidney disease, CKD stage 3 due to type 2 diabetes mellitus ([place]) (04/15/2019), Depression, Diabetes mellitus without complication ([place]), Fibromyalgia, Former smoker, GERD (gastroesophageal reflux disease), Heart murmur, History of chicken pox, History of colon polyps, Hypertension, IBS (irritable bowel syndrome), Legally blind, and Migraines.  she presents to [facility] today, 09/02/19,  for chief complaint of:  Pain follow-up   History of fibromyalgia, chronic neck pain.  The neck pain is likely osteoarthritis/DDD as a contributing factor but patient has other aches and pains that she has had for years that are not well explained medically.    She also had suffered some complications following brain surgery to treat cerebral aneurysm, unruptured.  She reports concentration issues and cognitive impairment such that her husband is a designated power of attorney, she is not allowed to make decisions, signed papers, talk to focus on the phone, etc.  She also has balance problems and occasional falls, concentration issues, memory loss, word finding difficulty, and vision impairment.  She is legally blind.   Previous fibromyalgia treatments include... TCA: Amitriptyline No , Trazodone No SSRI/SNRI: Duloxetine yes but stopped d/t concern it was increasing muscle stiffness, sertaline yes stopped d/t unknown Muscle Relaxer: Cyclobenzaprine no, Baclofen yes w/o relief, Tizanidine yes Anticonvulsants: Gabapentin Yes w/o relief, Pregabalin Yes currently NSAID Naproxen physical activity minimal      At today's visit 09/02/19 ... PMH, PSH, FH reviewed and updated as needed.  Current medication list and allergy/intolerance hx reviewed and updated as needed. (See remainder of HPI, ROS, Phys Exam below)   No results  found.  No results found for this or any previous visit (from the past 72 hour(s)).        ASSESSMENT/PLAN: The primary encounter diagnosis was Fibromyalgia. Diagnoses of Anxiety and depression and Type 2 diabetes mellitus with diabetic polyneuropathy, with long-term current use of insulin ([place]) were also pertinent to this visit.      Meds ordered this encounter  Medications  . sertraline (ZOLOFT) 25 MG tablet    Sig: Take 1 tablet (25 mg total) by mouth daily.    Dispense:  30 tablet    Refill:  2    Patient Instructions  Plan:  Pain/Fibromyalgia:  Continue the Lyrica as you are taking it.  We might consider taking the naproxen daily and adding a medication to help reduce acid production in the stomach, this way we can prevent that medicine from causing stomach ulcers.  What I would like to try first is adding back Zoloft -this is an antidepressant/antianxiety medication that has also been shown to potentially help with pain.  I could not find any reason why this was stopped initially, so let us restarted and please let me know if you have any concerning side effects.  In the first few days, we might expect some minor dizziness, nausea, stomach cramping but this should not be severe and it should go away.  Diabetes:  Stop the Novolin.    Will start the Tresiba this evening. You will start with 80 Units around the same time every day. You will increase your dose by 2 Units every 3 days until your morning fasting sugar is between 80-130, then continue on that dose and let us know what dose that is when we next see you. Contact us for readings below 80 or above  200   Continue the metformin as you have been taking           Follow-up plan: Return in about 4 weeks (around 09/30/2019) for RECHECK ON NEW FIBROMYALGIA/DEPRESSION MEDICATION -virtual visit or in office, patient  preference.                                                 ################################################# ################################################# ################################################# #################################################    Current Meds  Medication Sig  . ACCU-CHEK FASTCLIX LANCETS MISC   . atorvastatin (LIPITOR) 40 MG tablet Take 1 tablet (40 mg total) by mouth at bedtime.  . blood glucose meter kit and supplies KIT Dispense based on patient and insurance preference. Use up to four times daily as directed.  . Blood Glucose Monitoring Suppl (ACCU-CHEK AVIVA PLUS) w/Device KIT 1 each by Does not apply route as directed. E11.22  . chlorhexidine (PERIDEX) 0.12 % solution   . furosemide (LASIX) 40 MG tablet Take 40 mg by mouth daily.   Marland Kitchen glucose blood (ACCU-CHEK AVIVA PLUS) test strip 1 each by Other route 4 (four) times daily as needed for other. Use as instructed  . Insulin Degludec (TRESIBA FLEXTOUCH) 200 UNIT/ML SOPN Inject 80 Units into the skin at bedtime.  . Insulin Pen Needle 32G X 4 MM MISC For subcutaneous use with insulin  . lactulose (CHRONULAC) 10 GM/15ML solution Take by mouth.  Marland Kitchen lisinopril (ZESTRIL) 5 MG tablet Take 1 tablet (5 mg total) by mouth every morning.  Marland Kitchen LORazepam (ATIVAN) 0.5 MG tablet Take 1 tablet (0.5 mg total) by mouth daily as needed for anxiety.  . metFORMIN (GLUCOPHAGE-XR) 750 MG 24 hr tablet Take 1 tablet (750 mg total) by mouth 2 (two) times daily with a meal.  . metoprolol tartrate (LOPRESSOR) 25 MG tablet Take 25 mg by mouth 2 (two) times daily.   . naproxen (NAPROSYN) 375 MG tablet Take 1 tablet (375 mg total) by mouth 2 (two) times daily as needed for moderate pain. Limit use to 3 days per week  . potassium chloride SA (K-DUR) 20 MEQ tablet Take 1 tablet (20 mEq total) by mouth 2 (two) times daily.  . pregabalin (LYRICA) 100 MG capsule Take 1 capsule (100 mg total) by  mouth 2 (two) times daily.    Allergies  Allergen Reactions  . Cymbalta [Duloxetine Hcl] Other (See Comments)    Extreme joint pain and stiffness       Review of Systems:  Constitutional: No recent illness  HEENT: No  headache, no vision change  Cardiac: No  chest pain, No  pressure, No palpitations  Respiratory:  No  shortness of breath. No  Cough  Musculoskeletal: No new myalgia/arthralgia  Hem/Onc: No  easy bruising/bleeding  Neurologic: +generalized weakness, No  Dizziness  Psychiatric: +concerns with depression, +concerns with anxiety  Exam:  BP (!) 153/77 (BP Location: Left Arm, Patient Position: Sitting, Cuff Size: Large)   Pulse 73   Temp 98 F (36.7 C) (Oral)   Wt 192 lb (87.1 kg)   BMI 35.12 kg/m   Constitutional: VS see above. General Appearance: alert, well-developed, well-nourished, NAD  Neck: No masses, trachea midline.   Respiratory: Normal respiratory effort. no wheeze, no rhonchi, no rales  Cardiovascular: S1/S2 normal, no murmur, no rub/gallop auscultated. RRR.   Musculoskeletal: Gait normal. Symmetric and independent movement of all extremities  Neurological:  Normal balance/coordination. No tremor.  Skin: warm, dry, intact.   Psychiatric: Normal judgment/insight. Normal mood and affect. Oriented x3.       Visit summary with medication list and pertinent instructions was printed for patient to review, patient was advised to alert [place] if any updates are needed. All questions at time of visit were answered - patient instructed to contact office with any additional concerns. ER/RTC precautions were reviewed with the patient and understanding verbalized.   Note: Total time spent 40 minutes, greater than 50% of the visit was spent face-to-face counseling and coordinating care for the following: The primary encounter diagnosis was Fibromyalgia. Diagnoses of Anxiety and depression and Type 2 diabetes mellitus with diabetic polyneuropathy, with  long-term current use of insulin ([place]) were also pertinent to this visit.Marland Kitchen  Please note: voice recognition software was used to produce this document, and typos may escape review. Please contact Dr. Sheppard Coil for any needed clarifications.    Follow up plan: Return in about 4 weeks (around 09/30/2019) for RECHECK ON NEW FIBROMYALGIA/DEPRESSION MEDICATION -virtual visit or in office, patient preference.

## 2019-09-09 ENCOUNTER — Telehealth: Payer: Self-pay

## 2019-09-09 NOTE — Telephone Encounter (Signed)
Pt called requesting an update regarding her form she gave to provider on her visit on 09/02/19. As per pt, she provided a self stamp envelope for provider to mail the form to pt's address on file when completed. Wants to know if form will be mailed sometime this week. Pls advise, thanks.

## 2019-09-10 ENCOUNTER — Encounter: Payer: Self-pay | Admitting: Osteopathic Medicine

## 2019-09-10 DIAGNOSIS — Z794 Long term (current) use of insulin: Secondary | ICD-10-CM

## 2019-09-10 DIAGNOSIS — E1142 Type 2 diabetes mellitus with diabetic polyneuropathy: Secondary | ICD-10-CM

## 2019-09-10 MED ORDER — ACCU-CHEK FASTCLIX LANCETS MISC: 3 refills | Status: DC

## 2019-09-10 MED ORDER — ACCU-CHEK AVIVA PLUS VI STRP: ORAL_STRIP | 5 refills | Status: DC

## 2019-09-10 NOTE — Telephone Encounter (Signed)
Faxed and also mailed

## 2019-09-11 NOTE — Telephone Encounter (Signed)
Form faxed to [employer] at [phone] (cust. # [phone]). Confirmation received. Pt has been updated and is aware original form will be mailed this afternoon in self addressed envelope. Copy of form placed in scan folder. No other inquiries during call.

## 2019-09-17 ENCOUNTER — Encounter: Payer: Self-pay | Admitting: Osteopathic Medicine

## 2019-09-25 ENCOUNTER — Other Ambulatory Visit: Payer: Self-pay | Admitting: Physician Assistant

## 2019-09-25 DIAGNOSIS — E1142 Type 2 diabetes mellitus with diabetic polyneuropathy: Secondary | ICD-10-CM

## 2019-09-25 DIAGNOSIS — Z794 Long term (current) use of insulin: Secondary | ICD-10-CM

## 2019-10-06 ENCOUNTER — Encounter: Payer: Self-pay | Admitting: Osteopathic Medicine

## 2019-10-06 ENCOUNTER — Telehealth (INDEPENDENT_AMBULATORY_CARE_PROVIDER_SITE_OTHER): Payer: Medicare PPO | Admitting: Osteopathic Medicine

## 2019-10-06 VITALS — BP 146/86 | HR 80 | Temp 97.4°F | Wt 184.0 lb

## 2019-10-06 DIAGNOSIS — G8929 Other chronic pain: Secondary | ICD-10-CM

## 2019-10-06 DIAGNOSIS — M797 Fibromyalgia: Secondary | ICD-10-CM

## 2019-10-06 DIAGNOSIS — E1165 Type 2 diabetes mellitus with hyperglycemia: Secondary | ICD-10-CM

## 2019-10-06 DIAGNOSIS — M546 Pain in thoracic spine: Secondary | ICD-10-CM | POA: Diagnosis not present

## 2019-10-06 MED ORDER — OMEPRAZOLE 20 MG PO CPDR: 20.0000 mg | DELAYED_RELEASE_CAPSULE | Freq: Every day | ORAL | 3 refills | Status: DC

## 2019-10-06 MED ORDER — NAPROXEN 375 MG PO TABS: 375.0000 mg | ORAL_TABLET | Freq: Two times a day (BID) | ORAL | 3 refills | Status: DC | PRN

## 2019-10-06 NOTE — Progress Notes (Signed)
Virtual Visit via Video (App used: Doximity) Note  I connected with      Cassandra Reid on 10/06/19 at 8:48 AM by a telemedicine application and verified that I am speaking with the correct person using two identifiers.  Patient is at home I am in office   I discussed the limitations of evaluation and management by telemedicine and the availability of in person appointments. The patient expressed understanding and agreed to proceed.  History of Present Illness: Cassandra Reid is a 63 y.o. female who would like to discuss pain, sugars   Taking the tresiba. Taking 80 units around 10:30 AM  Fasting glucose: Was 120, 104 in AM she initially started the [place].  Later, went back up to 250's.  Metfromin bid  Fibromyalgia/arthritis.  She is having some issues with neck pain but this is a bit less than thoracic back pain and rib pain.  She states that when she was taking naproxen twice daily she was feeling a whole lot better.     Observations/Objective: BP (!) 146/86 (Patient Position: Sitting, Cuff Size: Normal)   Pulse 80   Temp (!) 97.4 F (36.3 C) (Oral)   Wt 184 lb (83.5 kg)   BMI 33.65 kg/m  BP Readings from Last 3 Encounters:  10/06/19 (!) 146/86  09/02/19 (!) 153/77  07/30/19 (!) 147/76   Exam: Normal Speech.  NAD  Lab and Radiology Results No results found for this or any previous visit (from the past 72 hour(s)). No results found.     Assessment and Plan: 63 y.o. female with The primary encounter diagnosis was Uncontrolled type 2 diabetes mellitus with hyperglycemia ([place]). Diagnoses of Fibromyalgia and Chronic thoracic back pain, unspecified back pain laterality were also pertinent to this visit.  I think it would be fine to do daily NSAID if this was worth it in terms of quality of life.  At next visit, will definitely recheck renal function.  She was placed on PPI for gastric protection.  Recent x-rays C-spine did show degenerative changes.  Might be  worth getting x-ray of thoracic spine, would also strongly consider MRI CT/thoracic spine as patient has failed greater than 6 weeks of physician directed conservative management.  I think fine to go ahead and just start taking the Tresiba in the evenings, if sugar bumps up a little bit I am not too worried about it.  Depending on how fasting sugars are looking/A1c over the next couple of months, would consider mealtime insulin.   PDMP not reviewed this encounter. Orders Placed This Encounter  Procedures  . DG Thoracic Spine W/Swimmers    Scheduling Instructions:     Please call patient to schedule    Order Specific Question:   Reason for Exam (SYMPTOM  OR DIAGNOSIS REQUIRED)    Answer:   thoracic back pain    Order Specific Question:   Preferred imaging location?    Answer:   [facility]    Order Specific Question:   Radiology Contrast Protocol - do NOT remove file path    Answer:   [email or web address]  . CBC  . COMPLETE METABOLIC PANEL WITH GFR  . A1C  . Amb ref to Medical Nutrition Therapy-MNT    Referral Priority:   Routine    Referral Type:   Consultation    Referral Reason:   Specialty Services Required    Requested Specialty:   Nutrition    Number of Visits Requested:   1  Follow Up Instructions: Return in about 4 weeks (around 11/03/2019) for Virtual visit if patient can get labs ahead of time, orders are in.  Recheck diabetes, pain.    I discussed the assessment and treatment plan with the patient. The patient was provided an opportunity to ask questions and all were answered. The patient agreed with the plan and demonstrated an understanding of the instructions.   The patient was advised to call back or seek an in-person evaluation if any new concerns, if symptoms worsen or if the condition fails to improve as anticipated.  25 minutes of non-face-to-face time was provided during this  encounter.      . . . . . . . . . . . . . Marland Kitchen                   Historical information moved to improve visibility of documentation.  Past Medical History:  Diagnosis Date  . Anxiety   . Arthritis   . Asthma   . Cerebral aneurysm without rupture   . Chronic kidney disease   . CKD stage 3 due to type 2 diabetes mellitus ([place]) 04/15/2019  . Depression   . Diabetes mellitus without complication ([place])   . Fibromyalgia   . Former smoker   . GERD (gastroesophageal reflux disease)   . Heart murmur   . History of chicken pox   . History of colon polyps   . Hypertension   . IBS (irritable bowel syndrome)   . Legally blind   . Migraines    Past Surgical History:  Procedure Laterality Date  . ABDOMINAL HYSTERECTOMY  2012  . APPENDECTOMY  1972  . CEREBRAL ANEURYSM REPAIR  2004   x 2  . CHOLECYSTECTOMY  2015  . MENISCUS REPAIR Bilateral    Social History   Tobacco Use  . Smoking status: Former Smoker    Packs/day: 1.00    Years: 3.00    Pack years: 3.00    Types: Cigarettes    Quit date: 09/04/1989    Years since quitting: 30.1  . Smokeless tobacco: Never Used  Substance Use Topics  . Alcohol use: Never    Frequency: Never   family history includes Alcohol abuse in her maternal grandfather and mother; Breast cancer in her sister; COPD in her father; Cancer in her maternal grandfather, maternal grandmother, paternal grandfather, and paternal grandmother; Diabetes in her father, mother, sister, and sister; Early death in her father; Heart disease in her father; Hyperlipidemia in her father; Hypertension in her father, mother, sister, and sister; Kidney disease in her mother and sister.  Medications: Current Outpatient Medications  Medication Sig Dispense Refill  . Accu-Chek FastClix Lancets MISC Use as instructed to check blood sugar 4 times daily. Dx E11.42 102 each 3  . atorvastatin (LIPITOR) 40 MG tablet Take 1 tablet (40 mg total) by mouth  at bedtime. 90 tablet 1  . Blood Glucose Monitoring Suppl (ACCU-CHEK AVIVA PLUS) w/Device KIT 1 each by Does not apply route as directed. E11.22 1 kit 0  . chlorhexidine (PERIDEX) 0.12 % solution   0  . furosemide (LASIX) 40 MG tablet Take 40 mg by mouth daily.     Marland Kitchen glucose blood (ACCU-CHEK AVIVA PLUS) test strip Use as instructed to check blood sugar 4 times daily. Dx E11.42 200 each 5  . Insulin Degludec (TRESIBA FLEXTOUCH) 200 UNIT/ML SOPN Inject 80 Units into the skin at bedtime. 9 pen 1  . Insulin Pen Needle 32G X  4 MM MISC For subcutaneous use with insulin 90 each 0  . lactulose (CHRONULAC) 10 GM/15ML solution Take by mouth.    Marland Kitchen lisinopril (ZESTRIL) 5 MG tablet Take 1 tablet (5 mg total) by mouth every morning. 90 tablet 1  . LORazepam (ATIVAN) 0.5 MG tablet Take 1 tablet (0.5 mg total) by mouth daily as needed for anxiety. 30 tablet 0  . metFORMIN (GLUCOPHAGE-XR) 750 MG 24 hr tablet TAKE 1 TABLET TWICE DAILY WITH MEALS 180 tablet 0  . metoprolol tartrate (LOPRESSOR) 25 MG tablet Take 25 mg by mouth 2 (two) times daily.     . naproxen (NAPROSYN) 375 MG tablet Take 1 tablet (375 mg total) by mouth 2 (two) times daily as needed for moderate pain. Limit use to 3 days per week 180 tablet 3  . potassium chloride SA (K-DUR) 20 MEQ tablet Take 1 tablet (20 mEq total) by mouth 2 (two) times daily. 180 tablet 1  . pregabalin (LYRICA) 100 MG capsule Take 1 capsule (100 mg total) by mouth 2 (two) times daily. 60 capsule 2  . sertraline (ZOLOFT) 25 MG tablet Take 1 tablet (25 mg total) by mouth daily. 30 tablet 2  . omeprazole (PRILOSEC) 20 MG capsule Take 1 capsule (20 mg total) by mouth daily. 90 capsule 3   No current facility-administered medications for this visit.    Allergies  Allergen Reactions  . Cymbalta [Duloxetine Hcl] Other (See Comments)    Extreme joint pain and stiffness

## 2019-10-08 ENCOUNTER — Encounter: Payer: Medicare PPO | Attending: Osteopathic Medicine | Admitting: Registered"

## 2019-10-08 ENCOUNTER — Encounter: Payer: Self-pay | Admitting: Osteopathic Medicine

## 2019-10-12 ENCOUNTER — Encounter: Payer: Self-pay | Admitting: Osteopathic Medicine

## 2019-10-14 ENCOUNTER — Ambulatory Visit (INDEPENDENT_AMBULATORY_CARE_PROVIDER_SITE_OTHER): Payer: Medicare PPO

## 2019-10-14 ENCOUNTER — Other Ambulatory Visit: Payer: Self-pay

## 2019-10-14 DIAGNOSIS — M797 Fibromyalgia: Secondary | ICD-10-CM | POA: Diagnosis not present

## 2019-10-14 DIAGNOSIS — M546 Pain in thoracic spine: Secondary | ICD-10-CM | POA: Diagnosis not present

## 2019-10-14 DIAGNOSIS — G8929 Other chronic pain: Secondary | ICD-10-CM | POA: Diagnosis not present

## 2019-10-14 DIAGNOSIS — M549 Dorsalgia, unspecified: Secondary | ICD-10-CM | POA: Diagnosis not present

## 2019-10-28 ENCOUNTER — Other Ambulatory Visit: Payer: Self-pay | Admitting: Physician Assistant

## 2019-10-28 DIAGNOSIS — E1169 Type 2 diabetes mellitus with other specified complication: Secondary | ICD-10-CM

## 2019-10-28 DIAGNOSIS — M791 Myalgia, unspecified site: Secondary | ICD-10-CM

## 2019-10-28 DIAGNOSIS — G894 Chronic pain syndrome: Secondary | ICD-10-CM

## 2019-10-29 DIAGNOSIS — E1165 Type 2 diabetes mellitus with hyperglycemia: Secondary | ICD-10-CM | POA: Diagnosis not present

## 2019-10-30 LAB — CBC
HCT: 41.6 % (ref 35.0–45.0)
Hemoglobin: 14.1 g/dL (ref 11.7–15.5)
MCH: 29.1 pg (ref 27.0–33.0)
MCHC: 33.9 g/dL (ref 32.0–36.0)
MCV: 86 fL (ref 80.0–100.0)
MPV: 12.2 fL (ref 7.5–12.5)
Platelets: 199 10*3/uL (ref 140–400)
RBC: 4.84 10*6/uL (ref 3.80–5.10)
RDW: 13.3 % (ref 11.0–15.0)
WBC: 7.2 10*3/uL (ref 3.8–10.8)

## 2019-10-30 LAB — COMPLETE METABOLIC PANEL WITH GFR
AG Ratio: 2 (calc) (ref 1.0–2.5)
ALT: 46 U/L — ABNORMAL HIGH (ref 6–29)
AST: 33 U/L (ref 10–35)
Albumin: 4.4 g/dL (ref 3.6–5.1)
Alkaline phosphatase (APISO): 94 U/L (ref 37–153)
BUN/Creatinine Ratio: 23 (calc) — ABNORMAL HIGH (ref 6–22)
BUN: 25 mg/dL (ref 7–25)
CO2: 25 mmol/L (ref 20–32)
Calcium: 9.9 mg/dL (ref 8.6–10.4)
Chloride: 103 mmol/L (ref 98–110)
Creat: 1.08 mg/dL — ABNORMAL HIGH (ref 0.50–0.99)
GFR, Est African American: 64 mL/min/{1.73_m2} (ref >=60)
GFR, Est Non African American: 55 mL/min/{1.73_m2} — ABNORMAL LOW (ref >=60)
Globulin: 2.2 g/dL (calc) (ref 1.9–3.7)
Glucose, Bld: 195 mg/dL — ABNORMAL HIGH (ref 65–139)
Potassium: 4.1 mmol/L (ref 3.5–5.3)
Sodium: 139 mmol/L (ref 135–146)
Total Bilirubin: 0.6 mg/dL (ref 0.2–1.2)
Total Protein: 6.6 g/dL (ref 6.1–8.1)

## 2019-10-30 LAB — HEMOGLOBIN A1C
Hgb A1c MFr Bld: 10.3 % of total Hgb — ABNORMAL HIGH (ref <5.7)
Mean Plasma Glucose: 249 (calc)
eAG (mmol/L): 13.8 (calc)

## 2019-11-02 ENCOUNTER — Encounter: Payer: Self-pay | Admitting: Osteopathic Medicine

## 2019-11-11 ENCOUNTER — Ambulatory Visit: Payer: Medicare PPO | Admitting: Registered"

## 2019-11-12 ENCOUNTER — Other Ambulatory Visit: Payer: Self-pay | Admitting: Family Medicine

## 2019-11-16 ENCOUNTER — Encounter: Payer: Self-pay | Admitting: Osteopathic Medicine

## 2019-11-18 ENCOUNTER — Encounter: Payer: Self-pay | Admitting: Osteopathic Medicine

## 2019-11-18 ENCOUNTER — Telehealth (INDEPENDENT_AMBULATORY_CARE_PROVIDER_SITE_OTHER): Payer: Medicare PPO | Admitting: Osteopathic Medicine

## 2019-11-18 VITALS — BP 131/67 | HR 68 | Temp 96.9°F | Wt 184.0 lb

## 2019-11-18 DIAGNOSIS — M791 Myalgia, unspecified site: Secondary | ICD-10-CM

## 2019-11-18 DIAGNOSIS — E1122 Type 2 diabetes mellitus with diabetic chronic kidney disease: Secondary | ICD-10-CM | POA: Diagnosis not present

## 2019-11-18 DIAGNOSIS — K581 Irritable bowel syndrome with constipation: Secondary | ICD-10-CM | POA: Diagnosis not present

## 2019-11-18 DIAGNOSIS — Z794 Long term (current) use of insulin: Secondary | ICD-10-CM

## 2019-11-18 DIAGNOSIS — N183 Chronic kidney disease, stage 3 unspecified: Secondary | ICD-10-CM

## 2019-11-18 DIAGNOSIS — E1142 Type 2 diabetes mellitus with diabetic polyneuropathy: Secondary | ICD-10-CM

## 2019-11-18 MED ORDER — LACTULOSE 10 GM/15ML PO SOLN: 10.0000 g | Freq: Two times a day (BID) | ORAL | 1 refills | Status: DC | PRN

## 2019-11-18 MED ORDER — TRESIBA FLEXTOUCH 200 UNIT/ML ~~LOC~~ SOPN: 80.0000 [IU] | PEN_INJECTOR | Freq: Every day | SUBCUTANEOUS | 99 refills | Status: DC

## 2019-11-18 MED ORDER — AMLODIPINE BESYLATE 10 MG PO TABS: 10.0000 mg | ORAL_TABLET | Freq: Every day | ORAL | 3 refills | Status: DC

## 2019-11-18 MED ORDER — SERTRALINE HCL 25 MG PO TABS: 25.0000 mg | ORAL_TABLET | Freq: Every day | ORAL | 3 refills | Status: DC

## 2019-11-18 MED ORDER — METOPROLOL TARTRATE 25 MG PO TABS: 25.0000 mg | ORAL_TABLET | Freq: Two times a day (BID) | ORAL | 3 refills | Status: DC

## 2019-11-18 MED ORDER — FUROSEMIDE 40 MG PO TABS: 40.0000 mg | ORAL_TABLET | Freq: Every day | ORAL | 3 refills | Status: DC

## 2019-11-18 NOTE — Progress Notes (Signed)
Virtual Visit via Video (App used: Doximity ) Note  I connected with      Cassandra Reid on 11/18/19 at 3:28 PM  by a telemedicine application and verified that I am speaking with the correct person using two identifiers.  Patient is at home I am in office   I discussed the limitations of evaluation and management by telemedicine and the availability of in person appointments. The patient expressed understanding and agreed to proceed.  History of Present Illness: Cassandra Reid is a 63 y.o. female who would like to discuss follow-up diabetes,    tresiba at night  Metformin at night and late afternoon  At 80 units  Fasting Glc around 200   Constipation has been a bit worse lately, previously on lactulose intermittently, which was helpful    Pain still an issue for her, limiting use of Naproxen but this does help with severe symptoms of fibromyalgia. Activity is limited. Would consider referral to spinal specialist but ok to defer this for now given pandemic.    Labs reviewed      Observations/Objective: BP 131/67   Pulse 68   Temp (!) 96.9 F (36.1 C) (Oral)   Wt 184 lb (83.5 kg)   BMI 33.65 kg/m  BP Readings from Last 3 Encounters:  11/18/19 131/67  10/06/19 (!) 146/86  09/02/19 (!) 153/77   Exam: Normal Speech.  NAD  Lab and Radiology Results No results found for this or any previous visit (from the past 72 hour(s)). No results found.     Assessment and Plan: 63 y.o. female with The primary encounter diagnosis was Type 2 diabetes mellitus with diabetic polyneuropathy, with long-term current use of insulin ([place]). Diagnoses of Myalgia, CKD stage 3 due to type 2 diabetes mellitus ([place]), and Irritable bowel syndrome with constipation were also pertinent to this visit.   PDMP not reviewed this encounter. No orders of the defined types were placed in this encounter.  Meds ordered this encounter  Medications  . Insulin Degludec (TRESIBA FLEXTOUCH) 200  UNIT/ML SOPN    Sig: Inject 80-100 Units into the skin at bedtime.    Dispense:  6 pen    Refill:  99  . amLODipine (NORVASC) 10 MG tablet    Sig: Take 1 tablet (10 mg total) by mouth daily.    Dispense:  90 tablet    Refill:  3  . furosemide (LASIX) 40 MG tablet    Sig: Take 1 tablet (40 mg total) by mouth daily.    Dispense:  90 tablet    Refill:  3  . metoprolol tartrate (LOPRESSOR) 25 MG tablet    Sig: Take 1 tablet (25 mg total) by mouth 2 (two) times daily.    Dispense:  180 tablet    Refill:  3  . sertraline (ZOLOFT) 25 MG tablet    Sig: Take 1 tablet (25 mg total) by mouth daily.    Dispense:  90 tablet    Refill:  3  . lactulose (CHRONULAC) 10 GM/15ML solution    Sig: Take 15-30 mLs (10-20 g total) by mouth 2 (two) times daily as needed for mild constipation or moderate constipation.    Dispense:  236 mL    Refill:  1   Patient Instructions  Ozempic or Trulicity or Bydureon would be good options to add to insulin and metformin. If A1C doesn't improve with this plus strict diet and increased exercise/activity, will probably need to add mealtime insulin at  least one meal per day. Let's increase the [place] but this might not make a huge difference. Message me in a few weeks with your sugar numbers!   Let me know if you'd like referral to spine specialist, can also see our sports medicine person in the office, Dr T. Madaline Brilliant to hold off for now until the pandemic calms down a bit.      Instructions sent via MyChart. If MyChart not available, pt was given option for info via personal e-mail w/ no guarantee of protected health info over unsecured e-mail communication, and MyChart sign-up instructions were sent to patient.   Follow Up Instructions: Return for Recheck sugars 3 months, MyChart message before then (reminder for 3 weeks) .    I discussed the assessment and treatment plan with the patient. The patient was provided an opportunity to ask questions and all were answered.  The patient agreed with the plan and demonstrated an understanding of the instructions.   The patient was advised to call back or seek an in-person evaluation if any new concerns, if symptoms worsen or if the condition fails to improve as anticipated.  25 minutes of non-face-to-face time was provided during this encounter.      . . . . . . . . . . . . . Marland Kitchen                   Historical information moved to improve visibility of documentation.  Past Medical History:  Diagnosis Date  . Anxiety   . Arthritis   . Asthma   . Cerebral aneurysm without rupture   . Chronic kidney disease   . CKD stage 3 due to type 2 diabetes mellitus ([place]) 04/15/2019  . Depression   . Diabetes mellitus without complication ([place])   . Fibromyalgia   . Former smoker   . GERD (gastroesophageal reflux disease)   . Heart murmur   . History of chicken pox   . History of colon polyps   . Hypertension   . IBS (irritable bowel syndrome)   . Legally blind   . Migraines    Past Surgical History:  Procedure Laterality Date  . ABDOMINAL HYSTERECTOMY  2012  . APPENDECTOMY  1972  . CEREBRAL ANEURYSM REPAIR  2004   x 2  . CHOLECYSTECTOMY  2015  . MENISCUS REPAIR Bilateral    Social History   Tobacco Use  . Smoking status: Former Smoker    Packs/day: 1.00    Years: 3.00    Pack years: 3.00    Types: Cigarettes    Quit date: 09/04/1989    Years since quitting: 30.2  . Smokeless tobacco: Never Used  Substance Use Topics  . Alcohol use: Never   family history includes Alcohol abuse in her maternal grandfather and mother; Breast cancer in her sister; COPD in her father; Cancer in her maternal grandfather, maternal grandmother, paternal grandfather, and paternal grandmother; Diabetes in her father, mother, sister, and sister; Early death in her father; Heart disease in her father; Hyperlipidemia in her father; Hypertension in her father, mother, sister, and sister; Kidney disease  in her mother and sister.  Medications: Current Outpatient Medications  Medication Sig Dispense Refill  . Accu-Chek FastClix Lancets MISC Use as instructed to check blood sugar 4 times daily. Dx E11.42 102 each 3  . amLODipine (NORVASC) 10 MG tablet Take 1 tablet (10 mg total) by mouth daily. 90 tablet 3  . atorvastatin (LIPITOR) 40 MG tablet TAKE 1 TABLET  AT BEDTIME 90 tablet 1  . Blood Glucose Monitoring Suppl (ACCU-CHEK AVIVA PLUS) w/Device KIT 1 each by Does not apply route as directed. E11.22 1 kit 0  . chlorhexidine (PERIDEX) 0.12 % solution   0  . furosemide (LASIX) 40 MG tablet Take 1 tablet (40 mg total) by mouth daily. 90 tablet 3  . glucose blood (ACCU-CHEK AVIVA PLUS) test strip Use as instructed to check blood sugar 4 times daily. Dx E11.42 200 each 5  . Insulin Degludec (TRESIBA FLEXTOUCH) 200 UNIT/ML SOPN Inject 80-100 Units into the skin at bedtime. 6 pen 99  . Insulin Pen Needle 32G X 4 MM MISC For subcutaneous use with insulin 90 each 0  . lactulose (CHRONULAC) 10 GM/15ML solution Take 15-30 mLs (10-20 g total) by mouth 2 (two) times daily as needed for mild constipation or moderate constipation. 236 mL 1  . lisinopril (ZESTRIL) 5 MG tablet Take 1 tablet (5 mg total) by mouth every morning. 90 tablet 1  . LORazepam (ATIVAN) 0.5 MG tablet Take 1 tablet (0.5 mg total) by mouth daily as needed for anxiety. 30 tablet 0  . metFORMIN (GLUCOPHAGE-XR) 750 MG 24 hr tablet TAKE 1 TABLET TWICE DAILY WITH MEALS 180 tablet 0  . metoprolol tartrate (LOPRESSOR) 25 MG tablet Take 1 tablet (25 mg total) by mouth 2 (two) times daily. 180 tablet 3  . naproxen (NAPROSYN) 375 MG tablet Take 1 tablet (375 mg total) by mouth 2 (two) times daily as needed for moderate pain. Limit use to 3 days per week 180 tablet 3  . omeprazole (PRILOSEC) 20 MG capsule Take 1 capsule (20 mg total) by mouth daily. 90 capsule 3  . potassium chloride SA (K-DUR) 20 MEQ tablet Take 1 tablet (20 mEq total) by mouth 2 (two)  times daily. 180 tablet 1  . pregabalin (LYRICA) 100 MG capsule TAKE ONE CAPSULE BY MOUTH TWICE A DAY 60 capsule 0  . sertraline (ZOLOFT) 25 MG tablet Take 1 tablet (25 mg total) by mouth daily. 90 tablet 3   No current facility-administered medications for this visit.   Allergies  Allergen Reactions  . Cymbalta [Duloxetine Hcl] Other (See Comments)    Extreme joint pain and stiffness

## 2019-11-18 NOTE — Patient Instructions (Addendum)
Ozempic or Trulicity or Bydureon would be good options to add to insulin and metformin. If A1C doesn't improve with this plus strict diet and increased exercise/activity, will probably need to add mealtime insulin at least one meal per day. Let's increase the [place] but this might not make a huge difference. Message me in a few weeks with your sugar numbers!   Let me know if you'd like referral to spine specialist, can also see our sports medicine person in the office, Dr T. Madaline Brilliant to hold off for now until the pandemic calms down a bit.

## 2019-11-19 NOTE — Telephone Encounter (Signed)
Received a fax from insurance that no PA was needed on the Accu-check meter for this patient. Forms sent to scan.

## 2019-11-23 ENCOUNTER — Encounter: Payer: Self-pay | Admitting: Osteopathic Medicine

## 2019-11-26 ENCOUNTER — Other Ambulatory Visit: Payer: Self-pay | Admitting: Osteopathic Medicine

## 2019-11-26 DIAGNOSIS — M791 Myalgia, unspecified site: Secondary | ICD-10-CM

## 2019-11-26 DIAGNOSIS — G894 Chronic pain syndrome: Secondary | ICD-10-CM

## 2019-11-26 NOTE — Telephone Encounter (Signed)
Requested medication (s) are due for refill today: yes  Requested medication (s) are on the active medication list: no  Last refill:  10/30/2019  Future visit scheduled: no  Notes to clinic:  This refill cannot be delegated   Requested Prescriptions  Pending Prescriptions Disp Refills   pregabalin (LYRICA) 100 MG capsule [Pharmacy Med Name: PREGABALIN 100 MG CAPSULE] 60 capsule 0    Sig: TAKE ONE CAPSULE BY MOUTH TWICE A DAY      Not Delegated - Neurology:  Anticonvulsants - Controlled Failed - 11/26/2019 12:22 PM      Failed - This refill cannot be delegated      Passed - Valid encounter within last 12 months    Recent Outpatient Visits           1 week ago Type 2 diabetes mellitus with diabetic polyneuropathy, with long-term current use of insulin (H[facility])   [place] [facility], Dorene Grebe, DO   1 month ago Uncontrolled type 2 diabetes mellitus with hyperglycemia [facility])   [place] [facility], Dorene Grebe, DO   2 months ago Fibromyalgia   [facility], Dorene Grebe, DO   3 months ago Type 2 diabetes mellitus with diabetic polyneuropathy, with long-term current use of insulin [facility])   [place] [facility], Bernerd Pho, PA-C   7 months ago Myalgia   [facility], [place], [place]

## 2019-11-30 ENCOUNTER — Other Ambulatory Visit: Payer: Self-pay

## 2019-11-30 ENCOUNTER — Ambulatory Visit (INDEPENDENT_AMBULATORY_CARE_PROVIDER_SITE_OTHER): Payer: Medicare PPO | Admitting: Osteopathic Medicine

## 2019-11-30 DIAGNOSIS — Z794 Long term (current) use of insulin: Secondary | ICD-10-CM

## 2019-11-30 DIAGNOSIS — E1142 Type 2 diabetes mellitus with diabetic polyneuropathy: Secondary | ICD-10-CM

## 2019-11-30 MED ORDER — ACCU-CHEK AVIVA PLUS W/DEVICE KIT: 1.0000 | PACK | 0 refills | Status: DC

## 2019-11-30 NOTE — Progress Notes (Signed)
Can we clarify that the patient is taking her Tresiba insulin in the evenings, this is not something that she should be taking with every meal.  If blood sugars are not improving, she may need short acting insulin with mealtimes, this would be a different type of insulin from the [place].  If she would like me to refer her to an endocrinologist or dietitian, I am happy to do that.  Otherwise, we can discuss adjusting her insulin regimen at her next appointment or if she has significant concerns about her medication regimen she can schedule something to see me sooner.

## 2019-11-30 NOTE — Progress Notes (Signed)
Patient in clinic for a glucose check. She reports her blood sugars are ranging in the 250's and 350's.  She reports taking 84 units of insulin at her meal times. She was advised to be on the max dose of the medication. I advised her to increase 5 units per PCP for a couple of days and then increase again to max dose of 100 units. No other questions. She was advised to make an appointment PCP and declined.   Her machine was 283 and ours was 345. PCP sent in a new machine to [facility]. Patient reports she is watching her carbohydrate intake.   Patient reports " I feel like I am starving myself and I need to eat. Does she need a diabetic diet reference? Please advise.

## 2019-12-01 ENCOUNTER — Encounter: Payer: Self-pay | Admitting: Internal Medicine

## 2019-12-01 ENCOUNTER — Encounter: Payer: Self-pay | Admitting: Osteopathic Medicine

## 2019-12-01 ENCOUNTER — Other Ambulatory Visit: Payer: Self-pay | Admitting: Osteopathic Medicine

## 2019-12-01 DIAGNOSIS — E1142 Type 2 diabetes mellitus with diabetic polyneuropathy: Secondary | ICD-10-CM

## 2019-12-01 DIAGNOSIS — Z794 Long term (current) use of insulin: Secondary | ICD-10-CM

## 2019-12-01 MED ORDER — ACCU-CHEK SOFT TOUCH LANCETS MISC: 99 refills | Status: DC

## 2019-12-01 MED ORDER — ACCU-CHEK GUIDE VI STRP: ORAL_STRIP | 99 refills | Status: DC

## 2019-12-01 MED ORDER — ACCU-CHEK SOFTCLIX LANCET DEV KIT: PACK | 99 refills | Status: DC

## 2019-12-01 NOTE — Progress Notes (Signed)
As per pt, she is unable to afford to get a short acting insulin. Pt stated she did connect with [facility] pharmacy and was informed that insulin medications are not covered on her plan. Pt stated she has increased Tresiba insulin to 90 units last night. When she woke up at 4 am, she check her blood sugar. Results was 297. Pt took her metformin rx shortly after blood glucose testing. She mentioned she rechecked her blood sugar at 8 am and results was 202. She feels that [place] rx is not working but has a 90 day supply on hand which she paid $400 OOP. Pt is ok with provider placing a referral for dietician. She did see a dietician previously through [facility] but could not recall the name. No other inquiries during call.

## 2019-12-01 NOTE — Telephone Encounter (Signed)
Please advise 

## 2019-12-01 NOTE — Addendum Note (Signed)
Addended by: Chalmers Cater on: 12/01/2019 02:23 PM   Modules accepted: Orders

## 2019-12-01 NOTE — Telephone Encounter (Signed)
Cassandra Reid, I see this was sent to [facility].. did you ever get a reply or speak with patient?

## 2019-12-02 ENCOUNTER — Other Ambulatory Visit: Payer: Self-pay | Admitting: Physician Assistant

## 2019-12-02 DIAGNOSIS — E1122 Type 2 diabetes mellitus with diabetic chronic kidney disease: Secondary | ICD-10-CM

## 2019-12-02 DIAGNOSIS — I152 Hypertension secondary to endocrine disorders: Secondary | ICD-10-CM

## 2019-12-02 DIAGNOSIS — E876 Hypokalemia: Secondary | ICD-10-CM

## 2019-12-02 DIAGNOSIS — E1159 Type 2 diabetes mellitus with other circulatory complications: Secondary | ICD-10-CM

## 2019-12-02 NOTE — Telephone Encounter (Signed)
Requested medication (s) are due for refill today: yes  Requested medication (s) are on the active medication list: yes  Last refill:  10/06/2019  Future visit scheduled: no  Notes to clinic:  review for refill   Requested Prescriptions  Pending Prescriptions Disp Refills   metFORMIN (GLUCOPHAGE-XR) 750 MG 24 hr tablet [Pharmacy Med Name: METFORMIN HYDROCHLORIDE ER 750 MG Tablet Extended Release 24 Hour] 180 tablet 0    Sig: TAKE 1 [place]      Endocrinology:  Diabetes - Biguanides Failed - 12/01/2019  7:24 PM      Failed - Cr in normal range and within 360 days    Creat  Date Value Ref Range Status  10/29/2019 1.08 (H) 0.50 - 0.99 mg/dL Final    Comment:    For patients >40 years of age, the reference limit for Creatinine is approximately 13% higher for people identified as African-American. .    Creatinine,U  Date Value Ref Range Status  09/26/2018 374.0 mg/dL Final   Creatinine, Urine  Date Value Ref Range Status  07/31/2019 176 20 - 275 mg/dL Final          Failed - HBA1C is between 0 and 7.9 and within 180 days    HbA1c, POC (controlled diabetic range)  Date Value Ref Range Status  04/09/2019 9.2 (A) 0.0 - 7.0 % Final   Hgb A1c MFr Bld  Date Value Ref Range Status  10/29/2019 10.3 (H) <5.7 % of total Hgb Final    Comment:    For someone without known diabetes, a hemoglobin A1c value of 6.5% or greater indicates that they may have  diabetes and this should be confirmed with a follow-up  test. . For someone with known diabetes, a value <7% indicates  that their diabetes is well controlled and a value  greater than or equal to 7% indicates suboptimal  control. A1c targets should be individualized based on  duration of diabetes, age, comorbid conditions, and  other considerations. . Currently, no consensus exists regarding use of hemoglobin A1c for diagnosis of diabetes for children. .           Failed - Valid encounter within  last 6 months    Recent Outpatient Visits           2 days ago Type 2 diabetes mellitus with diabetic polyneuropathy, with long-term current use of insulin ([place])   [place] [facility], Lanelle Bal, DO   2 weeks ago Type 2 diabetes mellitus with diabetic polyneuropathy, with long-term current use of insulin [facility])   [place] [facility], Lanelle Bal, DO   1 month ago Uncontrolled type 2 diabetes mellitus with hyperglycemia [facility])   [place] [facility] [place], Lanelle Bal, DO   3 months ago [place], Lanelle Bal, DO   4 months ago Type 2 diabetes mellitus with diabetic polyneuropathy, with long-term current use of insulin [facility])   [place] [facility], [place], PA-C              Passed - eGFR in normal range and within 360 days    GFR, Est African American  Date Value Ref Range Status  10/29/2019 64 > OR = 60 mL/min/1.45m Final   GFR, Est Non African American  Date Value Ref Range Status  10/29/2019 55 (L) > OR = 60 mL/min/1.762m  Final   GFR  Date Value Ref Range Status  09/26/2018 48.53 (L) >60.00 mL/min Final

## 2019-12-03 ENCOUNTER — Other Ambulatory Visit: Payer: Self-pay | Admitting: Osteopathic Medicine

## 2019-12-03 ENCOUNTER — Encounter: Payer: Self-pay | Admitting: Internal Medicine

## 2019-12-03 ENCOUNTER — Ambulatory Visit (INDEPENDENT_AMBULATORY_CARE_PROVIDER_SITE_OTHER): Payer: Medicare PPO | Admitting: Internal Medicine

## 2019-12-03 ENCOUNTER — Other Ambulatory Visit: Payer: Self-pay

## 2019-12-03 DIAGNOSIS — E1165 Type 2 diabetes mellitus with hyperglycemia: Secondary | ICD-10-CM

## 2019-12-03 DIAGNOSIS — E1142 Type 2 diabetes mellitus with diabetic polyneuropathy: Secondary | ICD-10-CM | POA: Diagnosis not present

## 2019-12-03 DIAGNOSIS — Z794 Long term (current) use of insulin: Secondary | ICD-10-CM

## 2019-12-03 DIAGNOSIS — G894 Chronic pain syndrome: Secondary | ICD-10-CM

## 2019-12-03 DIAGNOSIS — E1121 Type 2 diabetes mellitus with diabetic nephropathy: Secondary | ICD-10-CM

## 2019-12-03 DIAGNOSIS — M791 Myalgia, unspecified site: Secondary | ICD-10-CM

## 2019-12-03 DIAGNOSIS — N1831 Chronic kidney disease, stage 3a: Secondary | ICD-10-CM | POA: Diagnosis not present

## 2019-12-03 DIAGNOSIS — E1122 Type 2 diabetes mellitus with diabetic chronic kidney disease: Secondary | ICD-10-CM | POA: Insufficient documentation

## 2019-12-03 HISTORY — DX: Long term (current) use of insulin: Z79.4

## 2019-12-03 HISTORY — DX: Type 2 diabetes mellitus with hyperglycemia: E11.65

## 2019-12-03 MED ORDER — PREGABALIN 100 MG PO CAPS: 100.0000 mg | ORAL_CAPSULE | Freq: Two times a day (BID) | ORAL | 0 refills | Status: DC

## 2019-12-03 NOTE — Progress Notes (Signed)
Virtual Visit via Telephone Note  I connected with [name] on 12/03/2019 at 11:10AM by telephone and verified that I am speaking with the correct person using two identifiers.   I discussed the limitations, risks, security and privacy concerns of performing an evaluation and management service by telephone and the availability of in person appointments. I also discussed with the patient that there may be a patient responsible charge related to this service. The patient expressed understanding and agreed to proceed.  -Location of the patient :home -Location of the provider : Office -The names of all persons participating in the telemedicine service : Pt and myself        Name: MALEEKA Reid  Age/ Sex: 64 y.o., female   [name], September 19, 1956     PCP: Emeterio Reeve, DO   Reason for Endocrinology Evaluation: Type 2 Diabetes Mellitus  Initial Endocrine Consultative Visit: 11/24/18    PATIENT IDENTIFIER: Cassandra Reid is a 64 y.o. female with a past medical history of DM, mitral valve prolapse, dyslipidemia, OSA , brain aneurysm and HTN . The patient has followed with Endocrinology clinic since 11/24/18 for consultative assistance with management of her diabetes.  Ms. Paules moved from [place] to be close to her youngest  daughter   DIABETIC HISTORY:  [name] was diagnosed with T2DM ~ 2001. She has tried Actosdoes not recall intolerance. Followed by Metformin, then Glipizide then Novolin.  Has tried trulicity, but due to insurance coverage this was stopped. Marland Kitchen Her hemoglobin A1c has ranged 7.9% in 09/2018, peaking at 8.5% in 06/2018.  SUBJECTIVE:   During the last visit (12/22/2018): A1c 9.2% Continued Metformin and increased Novolin 50 units BID   Today (12/03/2019): [name] is here for a follow up on diabetes managent. She has not been here in 10 months, last time I saw her she was on Novolin and Metformin. Since her last visit here , her metformin dose has been  increased and insulin was switched from Novolin to tresiba . Otherwise, the patient has not required any recent emergency interventions for hypoglycemia and has not had recent hospitalizations secondary to hyper or hypoglycemic episodes.      ROS: As per HPI and as detailed below: Review of Systems  Constitutional: Negative for weight loss.  Respiratory: Negative for cough.   Cardiovascular: Negative for chest pain and palpitations.  Gastrointestinal: Negative for diarrhea and nausea.      HOME DIABETES REGIMEN:  Tresiba 100 units daily  Metformin 750 mg BID    Glucose LOG:             278.              231         299.            204.       276.       274         355.            247.        288.       247         340.            240.       278.       224   HISTORY:  Past Medical History:  Past Medical History:  Diagnosis Date  . Anxiety   . Arthritis   . Asthma   . Cerebral  aneurysm without rupture   . Chronic kidney disease   . CKD stage 3 due to type 2 diabetes mellitus ([place]) 04/15/2019  . Depression   . Diabetes mellitus without complication ([place])   . Fibromyalgia   . Former smoker   . GERD (gastroesophageal reflux disease)   . Heart murmur   . History of chicken pox   . History of colon polyps   . Hypertension   . IBS (irritable bowel syndrome)   . Legally blind   . Migraines    Past Surgical History:  Past Surgical History:  Procedure Laterality Date  . ABDOMINAL HYSTERECTOMY  2012  . APPENDECTOMY  1972  . CEREBRAL ANEURYSM REPAIR  2004   x 2  . CHOLECYSTECTOMY  2015  . MENISCUS REPAIR Bilateral     Social History:  reports that she quit smoking about 30 years ago. Her smoking use included cigarettes. She has a 3.00 pack-year smoking history. She has never used smokeless tobacco. She reports that she does not drink alcohol or use drugs. Family History:  Family History  Problem Relation Age of Onset  . Alcohol abuse Mother   . Diabetes Mother    . Hypertension Mother   . Kidney disease Mother   . COPD Father   . Diabetes Father   . Early death Father   . Heart disease Father   . Hyperlipidemia Father   . Hypertension Father   . Diabetes Sister   . Hypertension Sister   . Breast cancer Sister   . Cancer Maternal Grandmother   . Alcohol abuse Maternal Grandfather   . Cancer Maternal Grandfather   . Cancer Paternal Grandmother   . Cancer Paternal Grandfather   . Diabetes Sister   . Hypertension Sister   . Kidney disease Sister      HOME MEDICATIONS: Allergies as of 12/03/2019      Reactions   Cymbalta [duloxetine Hcl] Other (See Comments)   Extreme joint pain and stiffness      Medication List       Accurate as of December 03, 2019  9:14 AM. If you have any questions, ask your nurse or doctor.        Accu-Chek Aviva Plus w/Device Kit 1 each by Does not apply route as directed. E11.22   Accu-Chek Guide test strip Generic drug: glucose blood Use as instructed to check blood sugar 4 times daily. Dx E11.42   accu-chek soft touch lancets Use as instructed to check blood sugar 4 times daily. Dx E11.42   Accu-Chek Softclix Lancet Dev Kit Use as instructed to check blood sugar 4 times daily. Dx E11.42   amLODipine 10 MG tablet Commonly known as: NORVASC Take 1 tablet (10 mg total) by mouth daily.   atorvastatin 40 MG tablet Commonly known as: LIPITOR TAKE 1 TABLET AT BEDTIME   chlorhexidine 0.12 % solution Commonly known as: PERIDEX   furosemide 40 MG tablet Commonly known as: LASIX Take 1 tablet (40 mg total) by mouth daily.   Insulin Pen Needle 32G X 4 MM Misc For subcutaneous use with insulin   lactulose 10 GM/15ML solution Commonly known as: CHRONULAC Take 15-30 mLs (10-20 g total) by mouth 2 (two) times daily as needed for mild constipation or moderate constipation.   lisinopril 5 MG tablet Commonly known as: ZESTRIL Take 1 tablet (5 mg total) by mouth every morning.   LORazepam 0.5 MG  tablet Commonly known as: ATIVAN Take 1 tablet (0.5 mg total) by mouth  daily as needed for anxiety.   metFORMIN 750 MG 24 hr tablet Commonly known as: GLUCOPHAGE-XR TAKE 1 TABLET TWICE DAILY  WITH  MEALS   metoprolol tartrate 25 MG tablet Commonly known as: LOPRESSOR Take 1 tablet (25 mg total) by mouth 2 (two) times daily.   naproxen 375 MG tablet Commonly known as: NAPROSYN Take 1 tablet (375 mg total) by mouth 2 (two) times daily as needed for moderate pain. Limit use to 3 days per week   omeprazole 20 MG capsule Commonly known as: PRILOSEC Take 1 capsule (20 mg total) by mouth daily.   potassium chloride SA 20 MEQ tablet Commonly known as: KLOR-CON Take 1 tablet (20 mEq total) by mouth 2 (two) times daily.   pregabalin 100 MG capsule Commonly known as: LYRICA TAKE ONE CAPSULE BY MOUTH TWICE A DAY   sertraline 25 MG tablet Commonly known as: ZOLOFT Take 1 tablet (25 mg total) by mouth daily.   Tyler Aas FlexTouch 200 UNIT/ML Sopn Generic drug: Insulin Degludec Inject 80-100 Units into the skin at bedtime.         DATA REVIEWED:  Lab Results  Component Value Date   HGBA1C 10.3 (H) 10/29/2019   HGBA1C 9.1 (H) 07/31/2019   HGBA1C 9.2 (A) 04/09/2019   Lab Results  Component Value Date   MICROALBUR 2.9 07/31/2019   LDLCALC 48 07/31/2019   CREATININE 1.08 (H) 10/29/2019   Lab Results  Component Value Date   MICRALBCREAT 16 07/31/2019    Lab Results  Component Value Date   CHOL 120 07/31/2019   HDL 42 (L) 07/31/2019   LDLCALC 48 07/31/2019   TRIG 242 (H) 07/31/2019   CHOLHDL 2.9 07/31/2019         ASSESSMENT / PLAN / RECOMMENDATIONS:   1) Type 2 Diabetes Mellitus, Poorly controlled, With CKD III and neuropathic complications - Most recent A1c of 10.3%. Goal A1c < 7.0 %.    - Pt continues with hyperglycemia. She has not been seen in 10 months, when I first saw her she was on metformin and Novolin,in the interim, her metformin dose has been increased  and Novolin switched to [place] which is costing her $ 400 .  - Unable to use GLP-1 agonists due to cost. - Pt states she is on a low carb but in further details , she eats small amount her and there which eventually adds up. I have advised to eat normal meals and avoid snacks. She had been referred her to an RD in the past but cancelled.  - Her PCP increased her tresiba from 60 units to 90 and today she is starting 100 units. We discussed tresiba is a long acting insulin and will not cover post-prandial hyperglycemia if that is the cause of her hyperglycemia, also its hard for me to determine what her actual requirements are, when she is currently on escalating doses of tresiba.  - No changes will be made today, I will need more information from her and we discussed revisiting in 2 weeks    MEDICATIONS:  Continue Metformin 750 mg BID  Continue Tresiba 100 units daily     EDUCATION / INSTRUCTIONS:  BG monitoring instructions: Patient is instructed to check her blood sugars 2 times a day, before breakfast and bedtime  Call [facility] if: BG persistently < 70 or > 300. . I reviewed the Rule of 15 for the treatment of hypoglycemia in detail with the patient. Literature supplied.    F/U in 2  weeks - pt will call   I provided 17 minutes of non-face-to-face time during this encounter.   Signed electronically by: Mack Guise, MD  [facility]  [facility] [address] [place], [place] [address] Phone: [phone] FAX: [phone]   CC: Florinda Marker [address] [facility] [address] [place] [address] Phone: [phone]  Fax: [phone]  Return to Endocrinology clinic as below: Future Appointments  Date Time Provider [place]  12/03/2019 11:10 AM Sebastin Perlmutter, Melanie Crazier, MD [facility] None

## 2019-12-05 NOTE — Progress Notes (Addendum)
Will route her endocrinology office to see fi they have a dietician they prefer or someone in-house, looks like patient has finally seen them for appointment!

## 2019-12-14 ENCOUNTER — Other Ambulatory Visit: Payer: Self-pay | Admitting: Osteopathic Medicine

## 2019-12-14 DIAGNOSIS — Z1231 Encounter for screening mammogram for malignant neoplasm of breast: Secondary | ICD-10-CM

## 2019-12-21 ENCOUNTER — Encounter: Payer: Self-pay | Admitting: Osteopathic Medicine

## 2019-12-22 ENCOUNTER — Ambulatory Visit (INDEPENDENT_AMBULATORY_CARE_PROVIDER_SITE_OTHER): Payer: Medicare PPO | Admitting: Medical-Surgical

## 2019-12-22 ENCOUNTER — Encounter: Payer: Self-pay | Admitting: Medical-Surgical

## 2019-12-22 ENCOUNTER — Other Ambulatory Visit: Payer: Self-pay

## 2019-12-22 VITALS — BP 147/74 | HR 85 | Temp 97.7°F | Ht 62.0 in | Wt 191.1 lb

## 2019-12-22 DIAGNOSIS — R300 Dysuria: Secondary | ICD-10-CM | POA: Diagnosis not present

## 2019-12-22 LAB — POCT URINALYSIS DIP (CLINITEK)
Bilirubin, UA: NEGATIVE
Blood, UA: NEGATIVE
Glucose, UA: NEGATIVE mg/dL
Leukocytes, UA: NEGATIVE
Nitrite, UA: NEGATIVE
POC PROTEIN,UA: NEGATIVE
Spec Grav, UA: >=1.03 — AB (ref 1.010–1.025)
Urobilinogen, UA: 0.2 E.U./dL
pH, UA: 5 (ref 5.0–8.0)

## 2019-12-22 NOTE — Patient Instructions (Signed)
Push PO fluids  Cranberry juice is ok.. try to find diet if possible.

## 2019-12-22 NOTE — Progress Notes (Signed)
Subjective:    CC: frequency and urgency with urination  HPI: 64 year old female presenting with reports of frequency and urgency with urination. No burning or new back/flank pain. No nausea, fever, chills, or hematuria.   I reviewed the past medical history, family history, social history, surgical history, and allergies today and no changes were needed.  Please see the problem list section below in epic for further details.  Past Medical History: Past Medical History:  Diagnosis Date  . Anxiety   . Arthritis   . Asthma   . Cerebral aneurysm without rupture   . Chronic kidney disease   . CKD stage 3 due to type 2 diabetes mellitus ([place]) 04/15/2019  . Depression   . Diabetes mellitus without complication ([place])   . Fibromyalgia   . Former smoker   . GERD (gastroesophageal reflux disease)   . Heart murmur   . History of chicken pox   . History of colon polyps   . Hypertension   . IBS (irritable bowel syndrome)   . Legally blind   . Migraines    Past Surgical History: Past Surgical History:  Procedure Laterality Date  . ABDOMINAL HYSTERECTOMY  2012  . APPENDECTOMY  1972  . CEREBRAL ANEURYSM REPAIR  2004   x 2  . CHOLECYSTECTOMY  2015  . MENISCUS REPAIR Bilateral    Social History: Social History   Socioeconomic History  . Marital status: Married    Spouse name: [name]  . Number of children: 3  . Years of education: Not on file  . Highest education level: Associate degree: academic program  Occupational History  . Occupation: disabled  Tobacco Use  . Smoking status: Former Smoker    Packs/day: 1.00    Years: 3.00    Pack years: 3.00    Types: Cigarettes    Quit date: 09/04/1989    Years since quitting: 30.3  . Smokeless tobacco: Never Used  Substance and Sexual Activity  . Alcohol use: Never  . Drug use: Never  . Sexual activity: Not Currently  Other Topics Concern  . Not on file  Social History Narrative   Patient is right-handed. She lives with her  husband in a 2 story house. She walks the dog daily for exercise. She drinks I cup of coffee and 3 glasses of unsweet tea.   Social Determinants of Health   Financial Resource Strain:   . Difficulty of Paying Living Expenses: Not on file  Food Insecurity:   . Worried About Charity fundraiser in the Last Year: Not on file  . Ran Out of Food in the Last Year: Not on file  Transportation Needs:   . Lack of Transportation (Medical): Not on file  . Lack of Transportation (Non-Medical): Not on file  Physical Activity:   . Days of Exercise per Week: Not on file  . Minutes of Exercise per Session: Not on file  Stress:   . Feeling of Stress : Not on file  Social Connections:   . Frequency of Communication with Friends and Family: Not on file  . Frequency of Social Gatherings with Friends and Family: Not on file  . Attends Religious Services: Not on file  . Active Member of Clubs or Organizations: Not on file  . Attends Archivist Meetings: Not on file  . Marital Status: Not on file   Family History: Family History  Problem Relation Age of Onset  . Alcohol abuse Mother   . Diabetes Mother   .  Hypertension Mother   . Kidney disease Mother   . COPD Father   . Diabetes Father   . Early death Father   . Heart disease Father   . Hyperlipidemia Father   . Hypertension Father   . Diabetes Sister   . Hypertension Sister   . Breast cancer Sister   . Cancer Maternal Grandmother   . Alcohol abuse Maternal Grandfather   . Cancer Maternal Grandfather   . Cancer Paternal Grandmother   . Cancer Paternal Grandfather   . Diabetes Sister   . Hypertension Sister   . Kidney disease Sister    Allergies: Allergies  Allergen Reactions  . Cymbalta [Duloxetine Hcl] Other (See Comments)    Extreme joint pain and stiffness   Medications: See med rec.  Review of Systems: No fevers, chills, night sweats, weight loss, chest pain, or shortness of breath.   Objective:    General: Well  Developed, well nourished, and in no acute distress.  Neuro: Alert and oriented x3.  Skin: Warm and dry. Cardiac: Regular rate and rhythm, no murmurs rubs or gallops, no lower extremity edema.  Respiratory: Clear to auscultation bilaterally. Not using accessory muscles, speaking in full sentences. Abdomen: Soft, nontender, nondistended. No suprapubic tenderness. BS + x 4 quadrants.   Impression and Recommendations:    Urgency/frequency POCT UA negative for blood, nitrites, and leukocytes. Sending for culture. Push PO fluids. If culture negative, may need pelvic exam to assess for atrophic vaginitis.   Return if symptoms worsen or fail to improve.  ___________________________________________ Thayer Ohm, DNP, APRN, FNP-BC [facility]

## 2019-12-24 ENCOUNTER — Encounter: Payer: Self-pay | Admitting: Osteopathic Medicine

## 2019-12-24 LAB — URINE CULTURE
MICRO NUMBER:: 10107060
Result:: NO GROWTH
SPECIMEN QUALITY:: ADEQUATE

## 2019-12-28 DIAGNOSIS — E1122 Type 2 diabetes mellitus with diabetic chronic kidney disease: Secondary | ICD-10-CM | POA: Diagnosis not present

## 2019-12-28 DIAGNOSIS — R7989 Other specified abnormal findings of blood chemistry: Secondary | ICD-10-CM | POA: Diagnosis not present

## 2019-12-28 DIAGNOSIS — N183 Chronic kidney disease, stage 3 unspecified: Secondary | ICD-10-CM | POA: Diagnosis not present

## 2019-12-28 LAB — COMPLETE METABOLIC PANEL WITH GFR
AG Ratio: 2 (calc) (ref 1.0–2.5)
ALT: 40 U/L — ABNORMAL HIGH (ref 6–29)
AST: 22 U/L (ref 10–35)
Albumin: 4.5 g/dL (ref 3.6–5.1)
Alkaline phosphatase (APISO): 106 U/L (ref 37–153)
BUN: 25 mg/dL (ref 7–25)
CO2: 27 mmol/L (ref 20–32)
Calcium: 9.6 mg/dL (ref 8.6–10.4)
Chloride: 102 mmol/L (ref 98–110)
Creat: 0.97 mg/dL (ref 0.50–0.99)
GFR, Est African American: 73 mL/min/{1.73_m2} (ref >=60)
GFR, Est Non African American: 63 mL/min/{1.73_m2} (ref >=60)
Globulin: 2.2 g/dL (calc) (ref 1.9–3.7)
Glucose, Bld: 285 mg/dL — ABNORMAL HIGH (ref 65–99)
Potassium: 4.3 mmol/L (ref 3.5–5.3)
Sodium: 138 mmol/L (ref 135–146)
Total Bilirubin: 0.4 mg/dL (ref 0.2–1.2)
Total Protein: 6.7 g/dL (ref 6.1–8.1)

## 2019-12-31 ENCOUNTER — Encounter: Payer: Self-pay | Admitting: Osteopathic Medicine

## 2020-01-11 ENCOUNTER — Ambulatory Visit: Payer: Medicare PPO

## 2020-01-24 ENCOUNTER — Other Ambulatory Visit: Payer: Self-pay | Admitting: Osteopathic Medicine

## 2020-01-24 NOTE — Telephone Encounter (Signed)
Requested medication (s) are due for refill today: yes  Requested medication (s) are on the active medication list: yes  Last refill: 01/07/2020  Future visit scheduled: no  Notes to clinic: review for refill   Requested Prescriptions  Pending Prescriptions Disp Refills   lactulose (CHRONULAC) 10 GM/15ML solution [Pharmacy Med Name: LACTULOSE 10 GM/15ML Solution] 236 mL 1    Sig: TAKE 15-30 MLS (10-20 G TOTAL) BY MOUTH 2 (TWO) TIMES DAILY AS NEEDED FOR MILD CONSTIPATION OR MODERATE CONSTIPATION.      Gastroenterology:  Laxatives Passed - 01/24/2020  2:30 PM      Passed - Valid encounter within last 12 months    Recent Outpatient Visits           1 month ago Dysuria   [facility] [facility], [place], NP   1 month ago Type 2 diabetes mellitus with diabetic polyneuropathy, with long-term current use of insulin [facility])   [place] [facility], Dorene Grebe, DO   2 months ago Type 2 diabetes mellitus with diabetic polyneuropathy, with long-term current use of insulin [facility])   [place] [facility] [place], Dorene Grebe, DO   3 months ago Uncontrolled type 2 diabetes mellitus with hyperglycemia [facility])   [place] [facility], Dorene Grebe, DO   4 months ago Fibromyalgia   [facility], [place], DO

## 2020-01-25 ENCOUNTER — Telehealth: Payer: Self-pay

## 2020-01-25 NOTE — Telephone Encounter (Signed)
[facility] requesting 90d  med refill for lorazepam.

## 2020-01-26 NOTE — Telephone Encounter (Signed)
Not taken daily will not send more than 30 without discussion w/ patient, has appt upcoming

## 2020-01-27 ENCOUNTER — Other Ambulatory Visit: Payer: Self-pay

## 2020-01-27 ENCOUNTER — Ambulatory Visit
Admission: RE | Admit: 2020-01-27 | Discharge: 2020-01-27 | Disposition: A | Discharge status: Home / Self Care | Payer: Medicare PPO | Source: Ambulatory Visit | Attending: Osteopathic Medicine | Admitting: Osteopathic Medicine

## 2020-01-27 DIAGNOSIS — F329 Major depressive disorder, single episode, unspecified: Secondary | ICD-10-CM

## 2020-01-27 DIAGNOSIS — F32A Depression, unspecified: Secondary | ICD-10-CM

## 2020-01-27 DIAGNOSIS — Z1231 Encounter for screening mammogram for malignant neoplasm of breast: Secondary | ICD-10-CM | POA: Diagnosis not present

## 2020-01-27 DIAGNOSIS — F419 Anxiety disorder, unspecified: Secondary | ICD-10-CM

## 2020-01-27 MED ORDER — LORAZEPAM 0.5 MG PO TABS: 0.5000 mg | ORAL_TABLET | Freq: Every day | ORAL | 0 refills | Status: DC | PRN

## 2020-01-27 NOTE — Telephone Encounter (Signed)
Signed Rx for Ativan by accident.   Please reorder Rx.

## 2020-01-27 NOTE — Telephone Encounter (Signed)
Spoke with patient per note. Advised only 30 day Rx for Ativan until office visit.   Sent to [employer] [facility].

## 2020-01-29 MED ORDER — LORAZEPAM 0.5 MG PO TABS: 0.5000 mg | ORAL_TABLET | Freq: Every day | ORAL | 0 refills | Status: DC | PRN

## 2020-02-04 ENCOUNTER — Telehealth (INDEPENDENT_AMBULATORY_CARE_PROVIDER_SITE_OTHER): Payer: Medicare PPO | Admitting: Osteopathic Medicine

## 2020-02-04 ENCOUNTER — Encounter: Payer: Self-pay | Admitting: Osteopathic Medicine

## 2020-02-04 ENCOUNTER — Other Ambulatory Visit: Payer: Self-pay

## 2020-02-04 VITALS — BP 145/80 | HR 80 | Temp 97.6°F | Wt 180.0 lb

## 2020-02-04 DIAGNOSIS — Z794 Long term (current) use of insulin: Secondary | ICD-10-CM | POA: Diagnosis not present

## 2020-02-04 DIAGNOSIS — I10 Essential (primary) hypertension: Secondary | ICD-10-CM | POA: Diagnosis not present

## 2020-02-04 DIAGNOSIS — F419 Anxiety disorder, unspecified: Secondary | ICD-10-CM

## 2020-02-04 DIAGNOSIS — F32A Depression, unspecified: Secondary | ICD-10-CM

## 2020-02-04 DIAGNOSIS — E1142 Type 2 diabetes mellitus with diabetic polyneuropathy: Secondary | ICD-10-CM | POA: Diagnosis not present

## 2020-02-04 DIAGNOSIS — E1122 Type 2 diabetes mellitus with diabetic chronic kidney disease: Secondary | ICD-10-CM | POA: Diagnosis not present

## 2020-02-04 DIAGNOSIS — N183 Chronic kidney disease, stage 3 unspecified: Secondary | ICD-10-CM | POA: Diagnosis not present

## 2020-02-04 DIAGNOSIS — G4733 Obstructive sleep apnea (adult) (pediatric): Secondary | ICD-10-CM

## 2020-02-04 DIAGNOSIS — F329 Major depressive disorder, single episode, unspecified: Secondary | ICD-10-CM

## 2020-02-04 DIAGNOSIS — M797 Fibromyalgia: Secondary | ICD-10-CM | POA: Diagnosis not present

## 2020-02-04 MED ORDER — TIZANIDINE HCL 2 MG PO CAPS: 2.0000 mg | ORAL_CAPSULE | Freq: Three times a day (TID) | ORAL | 3 refills | Status: DC | PRN

## 2020-02-04 MED ORDER — SERTRALINE HCL 50 MG PO TABS: 50.0000 mg | ORAL_TABLET | Freq: Every day | ORAL | 3 refills | Status: DC

## 2020-02-04 NOTE — Progress Notes (Signed)
Called pt at 800 am & 810 am. No answer, left vm msgs.

## 2020-02-04 NOTE — Patient Instructions (Addendum)
Plan:  Medications:  OK to use the Naproxen few times per week  Continue the Lyrica/pregabalin   I'm refilling the Tizanidine muscle relaxer  I reviewed your allergy list - apparently you experienced severe joint pains around the time you started the Cymbalta - hard to say if it caused the pain or if you were having a flare-up. Let's try increasing the Zoloft from 25 mg to 50 mg and see how this does!   Expect a message form me in the next 2 weeks to check in. Let's plan to follow up in the office in 4 weeks. At that time, we can talk about how well the medicine is working for you, and we can consider increasing the dose, adding another medicine, etc.   If you experience problematic side effects, please let me know ASAP - we can switch the medicine any time, and we don't need an appointment for this.   I'll also get you connected with a therapist.   Any questions or concerns, call me!

## 2020-02-04 NOTE — Progress Notes (Signed)
Virtual Visit via Video (App used: Doximity) Note  I connected with      Cassandra Reid on 02/04/20 at 8:30 AM  by a telemedicine application and verified that I am speaking with the correct person using two identifiers.  Patient is at home I am in office   I discussed the limitations of evaluation and management by telemedicine and the availability of in person appointments. The patient expressed understanding and agreed to proceed.  History of Present Illness: Cassandra Reid is a 64 y.o. female who would like to discuss Pain management DM2 - following w/ endocrinology  Mental health  Depression associated w/ chronic pain  Current pertinent meds: Zoloft 25 mg daily Lorazepam 0.5 mg daily prn  Lyrica 100 mg bid  Naproxen 375 mg bid (3 days / week) - limited d/t hx AKI on NSAID Previous pertinent meds: Cymbalta caused worsening pain  TCA: Amitriptyline No  SSRI/SNRI: Duloxetine Yes  Muscle Relaxer: Tizanidine Yes  physical activity minimal d/t pain      Depression screen [facility] 2/9 02/04/2020 11/18/2019 10/06/2019  Decreased Interest '3 2 3  '$ Down, Depressed, Hopeless '3 2 2  '$ PHQ - 2 Score '6 4 5  '$ Altered sleeping 0 2 3  Tired, decreased energy '3 2 3  '$ Change in appetite 0 0 0  Feeling bad or failure about yourself  '3 2 3  '$ Trouble concentrating 0 0 0  Moving slowly or fidgety/restless 1 0 0  Suicidal thoughts 0 0 0  PHQ-9 Score '13 10 14  '$ Difficult doing work/chores Somewhat difficult Not difficult at all Not difficult at all   GAD 7 : Generalized Anxiety Score 02/04/2020 11/18/2019 10/06/2019 04/09/2019  Nervous, Anxious, on Edge '3 3 3 3  '$ Control/stop worrying '3 3 3 3  '$ Worry too much - different things '3 3 3 3  '$ Trouble relaxing '3 2 1 3  '$ Restless 0 0 0 0  Easily annoyed or irritable 1 1 0 1  Afraid - awful might happen '3 3 2 3  '$ Total GAD 7 Score '16 15 12 16  '$ Anxiety Difficulty Somewhat difficult Not difficult at all Not difficult at all Very difficult        Observations/Objective: BP (!) 145/80   Pulse 80   Temp 97.6 F (36.4 C) (Oral)   Wt 180 lb (81.6 kg)   BMI 32.92 kg/m  BP Readings from Last 3 Encounters:  02/04/20 (!) 145/80  12/22/19 (!) 147/74  11/18/19 131/67   Exam: Normal Speech.  NAD  Lab and Radiology Results No results found for this or any previous visit (from the past 72 hour(s)). No results found.     Assessment and Plan: 64 y.o. female with The primary encounter diagnosis was Fibromyalgia. Diagnoses of Anxiety and depression, Essential hypertension, OSA (obstructive sleep apnea), Type 2 diabetes mellitus with diabetic polyneuropathy, with long-term current use of insulin ([place]), and CKD stage 3 due to type 2 diabetes mellitus ([place]) were also pertinent to this visit.   PDMP not reviewed this encounter. No orders of the defined types were placed in this encounter.  Meds ordered this encounter  Medications  . tizanidine (ZANAFLEX) 2 MG capsule    Sig: Take 1 capsule (2 mg total) by mouth 3 (three) times daily as needed for muscle spasms.    Dispense:  90 capsule    Refill:  3  . sertraline (ZOLOFT) 50 MG tablet    Sig: Take 1 tablet (50 mg total) by mouth  daily.    Dispense:  90 tablet    Refill:  3    Cancel 25 mg dose, thanks   Patient Instructions  Plan:  Medications:  OK to use the Naproxen few times per week  Continue the Lyrica/pregabalin   I'm refilling the Tizanidine muscle relaxer  I reviewed your allergy list - apparently you experienced severe joint pains around the time you started the Cymbalta - hard to say if it caused the pain or if you were having a flare-up. Let's try increasing the Zoloft from 25 mg to 50 mg and see how this does!   Expect a message form me in the next 2 weeks to check in. Let's plan to follow up in the office in 4 weeks. At that time, we can talk about how well the medicine is working for you, and we can consider increasing the dose, adding another  medicine, etc.   If you experience problematic side effects, please let me know ASAP - we can switch the medicine any time, and we don't need an appointment for this.   I'll also get you connected with a therapist.   Any questions or concerns, call me!     Instructions sent via MyChart. If MyChart not available, pt was given option for info via personal e-mail w/ no guarantee of protected health info over unsecured e-mail communication, and MyChart sign-up instructions were sent to patient.   Follow Up Instructions: Return in about 4 weeks (around 03/03/2020) for VIRTUAL VISIT or IN-OFFICE VISIT - RECHECK ON [place] .    I discussed the assessment and treatment plan with the patient. The patient was provided an opportunity to ask questions and all were answered. The patient agreed with the plan and demonstrated an understanding of the instructions.   The patient was advised to call back or seek an in-person evaluation if any new concerns, if symptoms worsen or if the condition fails to improve as anticipated.  30 minutes of non-face-to-face time was provided during this encounter.      . . . . . . . . . . . . . Marland Kitchen                   Historical information moved to improve visibility of documentation.  Past Medical History:  Diagnosis Date  . Anxiety   . Arthritis   . Asthma   . Cerebral aneurysm without rupture   . Chronic kidney disease   . CKD stage 3 due to type 2 diabetes mellitus ([place]) 04/15/2019  . Depression   . Diabetes mellitus without complication ([place])   . Fibromyalgia   . Former smoker   . GERD (gastroesophageal reflux disease)   . Heart murmur   . History of chicken pox   . History of colon polyps   . Hypertension   . IBS (irritable bowel syndrome)   . Legally blind   . Migraines    Past Surgical History:  Procedure Laterality Date  . ABDOMINAL HYSTERECTOMY  2012  . APPENDECTOMY  1972  .  CEREBRAL ANEURYSM REPAIR  2004   x 2  . CHOLECYSTECTOMY  2015  . MENISCUS REPAIR Bilateral    Social History   Tobacco Use  . Smoking status: Former Smoker    Packs/day: 1.00    Years: 3.00    Pack years: 3.00    Types: Cigarettes    Quit date: 09/04/1989    Years since quitting:  30.4  . Smokeless tobacco: Never Used  Substance Use Topics  . Alcohol use: Never   family history includes Alcohol abuse in her maternal grandfather and mother; Breast cancer in her sister; COPD in her father; Cancer in her maternal grandfather, maternal grandmother, paternal grandfather, and paternal grandmother; Diabetes in her father, mother, sister, and sister; Early death in her father; Heart disease in her father; Hyperlipidemia in her father; Hypertension in her father, mother, sister, and sister; Kidney disease in her mother and sister.  Medications: Current Outpatient Medications  Medication Sig Dispense Refill  . amLODipine (NORVASC) 10 MG tablet Take 1 tablet (10 mg total) by mouth daily. 90 tablet 3  . atorvastatin (LIPITOR) 40 MG tablet TAKE 1 TABLET AT BEDTIME 90 tablet 1  . Blood Glucose Monitoring Suppl (ACCU-CHEK AVIVA PLUS) w/Device KIT 1 each by Does not apply route as directed. E11.22 1 kit 0  . chlorhexidine (PERIDEX) 0.12 % solution   0  . furosemide (LASIX) 40 MG tablet Take 1 tablet (40 mg total) by mouth daily. 90 tablet 3  . glucose blood (ACCU-CHEK GUIDE) test strip Use as instructed to check blood sugar 4 times daily. Dx E11.42 200 each prn  . Insulin Degludec (TRESIBA FLEXTOUCH) 200 UNIT/ML SOPN Inject 80-100 Units into the skin at bedtime. 6 pen 99  . Insulin Pen Needle 32G X 4 MM MISC For subcutaneous use with insulin 90 each 0  . lactulose (CHRONULAC) 10 GM/15ML solution TAKE 15-30 MLS (10-20 G TOTAL) BY MOUTH 2 (TWO) TIMES DAILY AS NEEDED FOR MILD CONSTIPATION OR MODERATE CONSTIPATION. 236 mL 1  . Lancets (ACCU-CHEK SOFT TOUCH) lancets Use as instructed to check blood sugar  4 times daily. Dx E11.42 200 each prn  . Lancets Misc. (ACCU-CHEK SOFTCLIX LANCET DEV) KIT Use as instructed to check blood sugar 4 times daily. Dx E11.42 1 kit prn  . lisinopril (ZESTRIL) 5 MG tablet TAKE 1 TABLET EVERY MORNING 90 tablet 1  . LORazepam (ATIVAN) 0.5 MG tablet Take 1 tablet (0.5 mg total) by mouth daily as needed for anxiety. 30 tablet 0  . metFORMIN (GLUCOPHAGE-XR) 750 MG 24 hr tablet TAKE 1 TABLET TWICE DAILY  WITH  MEALS 180 tablet 3  . metoprolol tartrate (LOPRESSOR) 25 MG tablet Take 1 tablet (25 mg total) by mouth 2 (two) times daily. 180 tablet 3  . naproxen (NAPROSYN) 375 MG tablet Take 1 tablet (375 mg total) by mouth 2 (two) times daily as needed for moderate pain. Limit use to 3 days per week 180 tablet 3  . omeprazole (PRILOSEC) 20 MG capsule Take 1 capsule (20 mg total) by mouth daily. 90 capsule 3  . potassium chloride SA (KLOR-CON) 20 MEQ tablet TAKE 1 TABLET TWICE DAILY 180 tablet 1  . pregabalin (LYRICA) 100 MG capsule Take 1 capsule (100 mg total) by mouth 2 (two) times daily. 180 capsule 0  . sertraline (ZOLOFT) 50 MG tablet Take 1 tablet (50 mg total) by mouth daily. 90 tablet 3  . tizanidine (ZANAFLEX) 2 MG capsule Take 1 capsule (2 mg total) by mouth 3 (three) times daily as needed for muscle spasms. 90 capsule 3   No current facility-administered medications for this visit.   Allergies  Allergen Reactions  . Cymbalta [Duloxetine Hcl] Other (See Comments)    Extreme joint pain and stiffness

## 2020-02-05 ENCOUNTER — Encounter: Payer: Self-pay | Admitting: Osteopathic Medicine

## 2020-02-08 NOTE — Telephone Encounter (Signed)
Forwarding to provider for review/update.

## 2020-02-10 ENCOUNTER — Encounter: Payer: Self-pay | Admitting: Osteopathic Medicine

## 2020-02-10 MED ORDER — TIZANIDINE HCL 4 MG PO TABS: 2.0000 mg | ORAL_TABLET | Freq: Three times a day (TID) | ORAL | 1 refills | Status: DC | PRN

## 2020-02-10 MED ORDER — GABAPENTIN 300 MG PO CAPS: 300.0000 mg | ORAL_CAPSULE | Freq: Three times a day (TID) | ORAL | 1 refills | Status: DC

## 2020-02-10 MED ORDER — TRESIBA FLEXTOUCH 200 UNIT/ML ~~LOC~~ SOPN: 80.0000 [IU] | PEN_INJECTOR | Freq: Every day | SUBCUTANEOUS | 99 refills | Status: DC

## 2020-02-25 NOTE — Telephone Encounter (Signed)
Documented

## 2020-02-29 ENCOUNTER — Encounter: Payer: Self-pay | Admitting: Osteopathic Medicine

## 2020-02-29 NOTE — Telephone Encounter (Signed)
Please call patient: I could not say for sure by just looking at it but if it is not causing any symptoms / not bothering her, okay to monitor.  Otherwise, would really need her to have her in the office to see this.  Okay to see me or one of the NP's/other

## 2020-03-15 ENCOUNTER — Encounter: Payer: Self-pay | Admitting: Osteopathic Medicine

## 2020-03-15 ENCOUNTER — Telehealth (INDEPENDENT_AMBULATORY_CARE_PROVIDER_SITE_OTHER): Payer: Medicare PPO | Admitting: Osteopathic Medicine

## 2020-03-15 VITALS — BP 160/84 | HR 80 | Ht 61.81 in | Wt 180.0 lb

## 2020-03-15 DIAGNOSIS — I341 Nonrheumatic mitral (valve) prolapse: Secondary | ICD-10-CM

## 2020-03-15 DIAGNOSIS — I10 Essential (primary) hypertension: Secondary | ICD-10-CM | POA: Diagnosis not present

## 2020-03-15 NOTE — Progress Notes (Signed)
Virtual Visit via Video (App used: Doximity) Note  I connected with      [name] on 02/04/20 at 8:30 AM  by a telemedicine application and verified that I am speaking with the correct person using two identifiers.  Patient is at home I am in office   I discussed the limitations of evaluation and management by telemedicine and the availability of in person appointments. The patient expressed understanding and agreed to proceed.  History of Present Illness: Cassandra Reid is a 64 y.o. female who would like to discuss pain, sugars, cardiology referral  Pain management  Feet feel like burning, pinching pain   DM2 - following w/ endocrinology - concerned about fasting Glc in 200's   Taking tresiba 100 units qhs - sent paperwork few weeks ago for patient assistance   Will get set   Cardiology referral  Hx mitral valve prolapse   Requests new referral    Current pertinent meds: Zoloft 25 mg daily --> increased to 50 mg last visit  Lorazepam 0.5 mg daily prn --> had to vchange to gabapentin, taking 300 mg tid  Lyrica 100 mg bid --> gabapentin  Naproxen 375 mg bid (3 days / week) - limited d/t hx AKI on NSAID -- advised more frequent use was ok as long as we are monitoring renal fxn  --> Tizanidine refilled last visit   Previous pertinent meds: Cymbalta caused worsening pain  TCA: Amitriptyline No  SSRI/SNRI: Duloxetine Yes  Muscle Relaxer: Tizanidine Yes  physical activity minimal d/t pain     Depression screen [facility] 2/9 02/04/2020 11/18/2019 10/06/2019  Decreased Interest '3 2 3  '$ Down, Depressed, Hopeless '3 2 2  '$ PHQ - 2 Score '6 4 5  '$ Altered sleeping 0 2 3  Tired, decreased energy '3 2 3  '$ Change in appetite 0 0 0  Feeling bad or failure about yourself  '3 2 3  '$ Trouble concentrating 0 0 0  Moving slowly or fidgety/restless 1 0 0  Suicidal thoughts 0 0 0  PHQ-9 Score '13 10 14  '$ Difficult doing work/chores Somewhat difficult Not difficult at all Not difficult at  all   GAD 7 : Generalized Anxiety Score 02/04/2020 11/18/2019 10/06/2019 04/09/2019  Nervous, Anxious, on Edge '3 3 3 3  '$ Control/stop worrying '3 3 3 3  '$ Worry too much - different things '3 3 3 3  '$ Trouble relaxing '3 2 1 3  '$ Restless 0 0 0 0  Easily annoyed or irritable 1 1 0 1  Afraid - awful might happen '3 3 2 3  '$ Total GAD 7 Score '16 15 12 16  '$ Anxiety Difficulty Somewhat difficult Not difficult at all Not difficult at all Very difficult       Observations/Objective: BP (!) 160/84 (BP Location: Left Arm, Patient Position: Sitting)   Pulse 80   Ht 5' 1.81" (1.57 m)   Wt 180 lb (81.6 kg)   BMI 33.12 kg/m  BP Readings from Last 3 Encounters:  03/15/20 (!) 160/84  02/04/20 (!) 145/80  12/22/19 (!) 147/74   Exam: Normal Speech.  NAD  Lab and Radiology Results No results found for this or any previous visit (from the past 72 hour(s)). No results found.     Assessment and Plan: 64 y.o. female with The primary encounter diagnosis was Mitral valve prolapse. A diagnosis of Essential hypertension was also pertinent to this visit.   PDMP not reviewed this encounter. Orders Placed This Encounter  Procedures  . Ambulatory  referral to Cardiology    Referral Priority:   Routine    Referral Type:   Consultation    Referral Reason:   Specialty Services Required    Referred to Provider:   Lelon Perla, MD    Requested Specialty:   Cardiology    Number of Visits Requested:   1   No orders of the defined types were placed in this encounter.  Patient Instructions  Sugars: Please reach out to endocrinologist [phone] to schedule follow-up I'll see if we have any samples of Tresiba to tie you over   Pain: Increase Gabapentin to 600 mg (2 capsules) three times per day as follows... Take 300, 300, 600 for a few days  Then can take 300, 600, 600 for a few days Then can take 600 three times per day   Heart: Referral in place to Dr Stanford Breed  downstairs  Allergies/congestion:  OK to take Allegra, Claritin, Zyrtec or their generic equivalents Continue Flonase   Will see you in maybe 3 months or so for annual check-up Call week prior to your visit if you would like blood work done ahead of time!     Instructions sent via MyChart. If MyChart not available, pt was given option for info via personal e-mail w/ no guarantee of protected health info over unsecured e-mail communication, and MyChart sign-up instructions were sent to patient.   Follow Up Instructions: Return in about 3 months (around 06/14/2020) for ANNUAL OV40 (pt to call week prior to visit for lab orders).    I discussed the assessment and treatment plan with the patient. The patient was provided an opportunity to ask questions and all were answered. The patient agreed with the plan and demonstrated an understanding of the instructions.   The patient was advised to call back or seek an in-person evaluation if any new concerns, if symptoms worsen or if the condition fails to improve as anticipated.  30 minutes of non-face-to-face time was provided during this encounter.      . . . . . . . . . . . . . Marland Kitchen                   Historical information moved to improve visibility of documentation.  Past Medical History:  Diagnosis Date  . Anxiety   . Arthritis   . Asthma   . Cerebral aneurysm without rupture   . Chronic kidney disease   . CKD stage 3 due to type 2 diabetes mellitus ([place]) 04/15/2019  . Depression   . Diabetes mellitus without complication ([place])   . Fibromyalgia   . Former smoker   . GERD (gastroesophageal reflux disease)   . Heart murmur   . History of chicken pox   . History of colon polyps   . Hypertension   . IBS (irritable bowel syndrome)   . Legally blind   . Migraines    Past Surgical History:  Procedure Laterality Date  . ABDOMINAL HYSTERECTOMY  2012  . APPENDECTOMY  1972  . CEREBRAL ANEURYSM REPAIR   2004   x 2  . CHOLECYSTECTOMY  2015  . MENISCUS REPAIR Bilateral    Social History   Tobacco Use  . Smoking status: Former Smoker    Packs/day: 1.00    Years: 3.00    Pack years: 3.00    Types: Cigarettes    Quit date: 09/04/1989    Years since quitting: 30.5  . Smokeless tobacco: Never Used  Substance  Use Topics  . Alcohol use: Never   family history includes Alcohol abuse in her maternal grandfather and mother; Breast cancer in her sister; COPD in her father; Cancer in her maternal grandfather, maternal grandmother, paternal grandfather, and paternal grandmother; Diabetes in her father, mother, sister, and sister; Early death in her father; Heart disease in her father; Hyperlipidemia in her father; Hypertension in her father, mother, sister, and sister; Kidney disease in her mother and sister.  Medications: Current Outpatient Medications  Medication Sig Dispense Refill  . amLODipine (NORVASC) 10 MG tablet Take 1 tablet (10 mg total) by mouth daily. 90 tablet 3  . atorvastatin (LIPITOR) 40 MG tablet TAKE 1 TABLET AT BEDTIME 90 tablet 1  . Blood Glucose Monitoring Suppl (ACCU-CHEK AVIVA PLUS) w/Device KIT 1 each by Does not apply route as directed. E11.22 1 kit 0  . furosemide (LASIX) 40 MG tablet Take 1 tablet (40 mg total) by mouth daily. 90 tablet 3  . gabapentin (NEURONTIN) 300 MG capsule Take 1-2 capsules (300-600 mg total) by mouth 3 (three) times daily. 540 capsule 1  . glucose blood (ACCU-CHEK GUIDE) test strip Use as instructed to check blood sugar 4 times daily. Dx E11.42 200 each prn  . insulin degludec (TRESIBA FLEXTOUCH) 200 UNIT/ML FlexTouch Pen Inject 80-100 Units into the skin at bedtime. 6 pen 99  . Insulin Pen Needle 32G X 4 MM MISC For subcutaneous use with insulin 90 each 0  . lactulose (CHRONULAC) 10 GM/15ML solution TAKE 15-30 MLS (10-20 G TOTAL) BY MOUTH 2 (TWO) TIMES DAILY AS NEEDED FOR MILD CONSTIPATION OR MODERATE CONSTIPATION. 236 mL 1  . Lancets (ACCU-CHEK  SOFT TOUCH) lancets Use as instructed to check blood sugar 4 times daily. Dx E11.42 200 each prn  . Lancets Misc. (ACCU-CHEK SOFTCLIX LANCET DEV) KIT Use as instructed to check blood sugar 4 times daily. Dx E11.42 1 kit prn  . lisinopril (ZESTRIL) 5 MG tablet TAKE 1 TABLET EVERY MORNING 90 tablet 1  . LORazepam (ATIVAN) 0.5 MG tablet Take 1 tablet (0.5 mg total) by mouth daily as needed for anxiety. 30 tablet 0  . metFORMIN (GLUCOPHAGE-XR) 750 MG 24 hr tablet TAKE 1 TABLET TWICE DAILY  WITH  MEALS 180 tablet 3  . metoprolol tartrate (LOPRESSOR) 25 MG tablet Take 1 tablet (25 mg total) by mouth 2 (two) times daily. 180 tablet 3  . naproxen (NAPROSYN) 375 MG tablet Take 1 tablet (375 mg total) by mouth 2 (two) times daily as needed for moderate pain. Limit use to 3 days per week 180 tablet 3  . omeprazole (PRILOSEC) 20 MG capsule Take 1 capsule (20 mg total) by mouth daily. 90 capsule 3  . potassium chloride SA (KLOR-CON) 20 MEQ tablet TAKE 1 TABLET TWICE DAILY 180 tablet 1  . sertraline (ZOLOFT) 50 MG tablet Take 1 tablet (50 mg total) by mouth daily. 90 tablet 3  . chlorhexidine (PERIDEX) 0.12 % solution   0  . tiZANidine (ZANAFLEX) 4 MG tablet Take 0.5 tablets (2 mg total) by mouth every 8 (eight) hours as needed for muscle spasms. (Patient not taking: Reported on 03/15/2020) 135 tablet 1   No current facility-administered medications for this visit.   Allergies  Allergen Reactions  . Cymbalta [Duloxetine Hcl] Other (See Comments)    Extreme joint pain and stiffness

## 2020-03-15 NOTE — Progress Notes (Signed)
Request referral to Cardiologist due to prior history.

## 2020-03-15 NOTE — Patient Instructions (Addendum)
Sugars: Please reach out to endocrinologist [phone] to schedule follow-up I'll see if we have any samples of Tresiba to tie you over   Pain: Increase Gabapentin to 600 mg (2 capsules) three times per day as follows... Take 300, 300, 600 for a few days  Then can take 300, 600, 600 for a few days Then can take 600 three times per day   Heart: Referral in place to Dr Jens Som downstairs  Allergies/congestion:  OK to take Allegra, Claritin, Zyrtec or their generic equivalents Continue Flonase   Will see you in maybe 3 months or so for annual check-up Call week prior to your visit if you would like blood work done ahead of time!

## 2020-03-16 ENCOUNTER — Other Ambulatory Visit: Payer: Self-pay

## 2020-03-21 ENCOUNTER — Encounter: Payer: Self-pay | Admitting: Osteopathic Medicine

## 2020-03-21 ENCOUNTER — Ambulatory Visit (INDEPENDENT_AMBULATORY_CARE_PROVIDER_SITE_OTHER): Payer: Medicare PPO | Admitting: Internal Medicine

## 2020-03-21 ENCOUNTER — Other Ambulatory Visit: Payer: Self-pay

## 2020-03-21 ENCOUNTER — Encounter: Payer: Self-pay | Admitting: Internal Medicine

## 2020-03-21 VITALS — BP 132/64 | HR 67 | Temp 98.1°F | Ht 62.0 in | Wt 194.8 lb

## 2020-03-21 DIAGNOSIS — E1165 Type 2 diabetes mellitus with hyperglycemia: Secondary | ICD-10-CM | POA: Diagnosis not present

## 2020-03-21 DIAGNOSIS — Z794 Long term (current) use of insulin: Secondary | ICD-10-CM | POA: Diagnosis not present

## 2020-03-21 DIAGNOSIS — E1142 Type 2 diabetes mellitus with diabetic polyneuropathy: Secondary | ICD-10-CM | POA: Diagnosis not present

## 2020-03-21 LAB — POCT GLYCOSYLATED HEMOGLOBIN (HGB A1C): Hemoglobin A1C: 10.7 % — AB (ref 4.0–5.6)

## 2020-03-21 MED ORDER — METFORMIN HCL ER 750 MG PO TB24: 750.0000 mg | ORAL_TABLET | Freq: Two times a day (BID) | ORAL | 3 refills | Status: DC

## 2020-03-21 MED ORDER — NOVOLIN 70/30 RELION (70-30) 100 UNIT/ML ~~LOC~~ SUSP: 50.0000 [IU] | Freq: Two times a day (BID) | SUBCUTANEOUS | 11 refills | Status: DC

## 2020-03-21 NOTE — Patient Instructions (Addendum)
-   Stop Tresiba - Continue Metformin 750 mg 2 tablets daily - Restart Novolin Mix (70/30) 50 units before Breakfast and 50 units before Supper  - Take it 20-30 units before the meal       HOW TO TREAT LOW BLOOD SUGARS (Blood sugar LESS THAN 70 MG/DL)  Please follow the RULE OF 15 for the treatment of hypoglycemia treatment (when your (blood sugars are less than 70 mg/dL)    STEP 1: Take 15 grams of carbohydrates when your blood sugar is low, which includes:   3-4 GLUCOSE TABS  OR  3-4 OZ OF JUICE OR REGULAR SODA OR  ONE TUBE OF GLUCOSE GEL     STEP 2: RECHECK blood sugar in 15 MINUTES STEP 3: If your blood sugar is still low at the 15 minute recheck --> then, go back to STEP 1 and treat AGAIN with another 15 grams of carbohydrates.

## 2020-03-21 NOTE — Progress Notes (Signed)
Name: Cassandra Reid  Age/ Sex: 64 y.o., female   MRN/ DOB: [id number], 22-Oct-1956     PCP: Emeterio Reeve, DO   Reason for Endocrinology Evaluation: Type 2 Diabetes Mellitus  Initial Endocrine Consultative Visit: 11/24/18    PATIENT IDENTIFIER: Cassandra Reid is a 64 y.o. female with a past medical history of DM, mitral valve prolapse, dyslipidemia, OSA , brain aneurysm and HTN . The patient has followed with Endocrinology clinic since 11/24/18 for consultative assistance with management of her diabetes.  Cassandra [place] moved from [place] to be close to her youngest  daughter   DIABETIC HISTORY:  Cassandra Reid was diagnosed with T2DM ~ 2001. She has tried Actos ,does not recall intolerance. Followed by Metformin, then Glipizide then Novolin.  Has tried trulicity, but due to insurance coverage this was stopped. Marland Kitchen Her hemoglobin A1c has ranged 7.9% in 09/2018, peaking at 8.5% in 06/2018.  On her initial visit to our clinic she had an A1c of 7.9%. She was on Glipizide, metformin and insulin mix, we stopped her Glipizide and continued insulin Mix as well as continued metformin.  The patient was lost to follow up  For a year prior to presenting to our clinic by 11/2019 , but then she was not on the insulin mix any more and was on Tresiba and metformin.   SUBJECTIVE:   During the last visit (11/23/2019): This was a virtual visit. We continued with metformin and Tyler Aas     Today (03/21/2020): Cassandra Reid is here for a follow up on diabetes managent. She checks her blood sugars 4 times daily, before meals . The patient has not had hypoglycemic episodes since the last clinic visit. Otherwise, the patient has not required any recent emergency interventions for hypoglycemia and has not had recent hospitalizations secondary to hyper or hypoglycemic episodes.   She has aches , pains and burning sensation in her feet     HOME DIABETES REGIMEN:  Tresiba 100 units daily - last day today  Metformin 750 mg BID     METER DOWNLOAD SUMMARY: Date range evaluated: 4/4-03/21/2020 Fingerstick Blood Glucose Tests = 101 Average Number Tests/Day = 3.4 Overall Mean FS Glucose = 296 Standard Deviation = 59.6  BG Ranges: Low =201 High = 449   Hypoglycemic Events/30 Days: BG < 50 = 0 Episodes of symptomatic severe hypoglycemia = 0     HISTORY:  Past Medical History:  Past Medical History:  Diagnosis Date  . Anxiety   . Arthritis   . Asthma   . Cerebral aneurysm without rupture   . Chronic kidney disease   . CKD stage 3 due to type 2 diabetes mellitus ([place]) 04/15/2019  . Depression   . Diabetes mellitus without complication ([place])   . Fibromyalgia   . Former smoker   . GERD (gastroesophageal reflux disease)   . Heart murmur   . History of chicken pox   . History of colon polyps   . Hypertension   . IBS (irritable bowel syndrome)   . Legally blind   . Migraines    Past Surgical History:  Past Surgical History:  Procedure Laterality Date  . ABDOMINAL HYSTERECTOMY  2012  . APPENDECTOMY  1972  . CEREBRAL ANEURYSM REPAIR  2004   x 2  . CHOLECYSTECTOMY  2015  . MENISCUS REPAIR Bilateral     Social History:  reports that she quit smoking about 30 years ago. Her smoking use included cigarettes. She has a 3.00 pack-year smoking  history. She has never used smokeless tobacco. She reports that she does not drink alcohol or use drugs. Family History:  Family History  Problem Relation Age of Onset  . Alcohol abuse Mother   . Diabetes Mother   . Hypertension Mother   . Kidney disease Mother   . COPD Father   . Diabetes Father   . Early death Father   . Heart disease Father   . Hyperlipidemia Father   . Hypertension Father   . Diabetes Sister   . Hypertension Sister   . Breast cancer Sister   . Cancer Maternal Grandmother   . Alcohol abuse Maternal Grandfather   . Cancer Maternal Grandfather   . Cancer Paternal Grandmother   . Cancer Paternal Grandfather   . Diabetes Sister   .  Hypertension Sister   . Kidney disease Sister      HOME MEDICATIONS: Allergies as of 03/21/2020      Reactions   Cymbalta [duloxetine Hcl] Other (See Comments)   Extreme joint pain and stiffness      Medication List       Accurate as of Mar 21, 2020 10:11 AM. If you have any questions, ask your nurse or doctor.        STOP taking these medications   Tresiba FlexTouch 200 UNIT/ML FlexTouch Pen Generic drug: insulin degludec Stopped by: Dorita Sciara, MD     TAKE these medications   Accu-Chek Aviva Plus w/Device Kit 1 each by Does not apply route as directed. E11.22   Accu-Chek Guide test strip Generic drug: glucose blood Use as instructed to check blood sugar 4 times daily. Dx E11.42   accu-chek soft touch lancets Use as instructed to check blood sugar 4 times daily. Dx E11.42   Accu-Chek Softclix Lancet Dev Kit Use as instructed to check blood sugar 4 times daily. Dx E11.42   amLODipine 10 MG tablet Commonly known as: NORVASC Take 1 tablet (10 mg total) by mouth daily.   atorvastatin 40 MG tablet Commonly known as: LIPITOR TAKE 1 TABLET AT BEDTIME   chlorhexidine 0.12 % solution Commonly known as: PERIDEX   furosemide 40 MG tablet Commonly known as: LASIX Take 1 tablet (40 mg total) by mouth daily.   gabapentin 300 MG capsule Commonly known as: NEURONTIN Take 1-2 capsules (300-600 mg total) by mouth 3 (three) times daily.   Insulin Pen Needle 32G X 4 MM Misc For subcutaneous use with insulin   lactulose 10 GM/15ML solution Commonly known as: CHRONULAC TAKE 15-30 MLS (10-20 G TOTAL) BY MOUTH 2 (TWO) TIMES DAILY AS NEEDED FOR MILD CONSTIPATION OR MODERATE CONSTIPATION.   lisinopril 5 MG tablet Commonly known as: ZESTRIL TAKE 1 TABLET EVERY MORNING   LORazepam 0.5 MG tablet Commonly known as: ATIVAN Take 1 tablet (0.5 mg total) by mouth daily as needed for anxiety.   metFORMIN 750 MG 24 hr tablet Commonly known as: GLUCOPHAGE-XR TAKE 1  TABLET TWICE DAILY  WITH  MEALS   metoprolol tartrate 25 MG tablet Commonly known as: LOPRESSOR Take 1 tablet (25 mg total) by mouth 2 (two) times daily.   naproxen 375 MG tablet Commonly known as: NAPROSYN Take 1 tablet (375 mg total) by mouth 2 (two) times daily as needed for moderate pain. Limit use to 3 days per week   NovoLIN 70/30 ReliOn (70-30) 100 UNIT/ML injection Generic drug: insulin NPH-regular Human Inject 50 Units into the skin 2 (two) times daily with a meal. Started by: Dorita Sciara,  MD   omeprazole 20 MG capsule Commonly known as: PRILOSEC Take 1 capsule (20 mg total) by mouth daily.   potassium chloride SA 20 MEQ tablet Commonly known as: KLOR-CON TAKE 1 TABLET TWICE DAILY   sertraline 50 MG tablet Commonly known as: ZOLOFT Take 1 tablet (50 mg total) by mouth daily.   tiZANidine 4 MG tablet Commonly known as: ZANAFLEX Take 0.5 tablets (2 mg total) by mouth every 8 (eight) hours as needed for muscle spasms.        OBJECTIVE:   Vital Signs: BP 132/64 (BP Location: Left Arm, Patient Position: Sitting, Cuff Size: Large)   Pulse 67   Temp 98.1 F (36.7 C)   Ht '5\' 2"'$  (1.575 m)   Wt 194 lb 12.8 oz (88.4 kg)   SpO2 98%   BMI 35.63 kg/m   Wt Readings from Last 3 Encounters:  03/21/20 194 lb 12.8 oz (88.4 kg)  03/15/20 180 lb (81.6 kg)  02/04/20 180 lb (81.6 kg)     Exam: General: Pt appears anxious  Lungs: Clear with good BS bilat with no rales, rhonchi, or wheezes  Heart: RRR with normal S1 and S2 and no gallops; no murmurs; no rub  Abdomen: Normoactive bowel sounds, soft, nontender, without masses or organomegaly palpable  Extremities: No pretibial edema. No tremor.  Neuro: MS is good with appropriate affect, pt is alert and Ox3    DM foot exam: 03/21/2020   The skin of the feet is intact without sores or ulcerations. The pedal pulses are 2+ on right and 2+ on left. The sensation is decreased to a screening 5.07, 10 gram monofilament  bilaterally         DATA REVIEWED:  Lab Results  Component Value Date   HGBA1C 10.7 (A) 03/21/2020   HGBA1C 10.3 (H) 10/29/2019   HGBA1C 9.1 (H) 07/31/2019   Lab Results  Component Value Date   MICROALBUR 2.9 07/31/2019   LDLCALC 48 07/31/2019   CREATININE 0.97 12/28/2019   Lab Results  Component Value Date   MICRALBCREAT 16 07/31/2019    Lab Results  Component Value Date   CHOL 120 07/31/2019   HDL 42 (L) 07/31/2019   LDLCALC 48 07/31/2019   TRIG 242 (H) 07/31/2019   CHOLHDL 2.9 07/31/2019         ASSESSMENT / PLAN / RECOMMENDATIONS:   1) Type 2 Diabetes Mellitus, Poorly controlled, With neuropathic complications - Most recent A1c of 10.7 %. Goal A1c < 7.0 %.    - Pt with worsening hyperglycemia, its clear to me she needs prandial insulin.  - Unfortunately she is currently in the donut hole, per pt, her  PCP started pt assistance application for her, in the meantime she has opted to go back to using ReliOn insulin mix, I have advised her of the importance of taking this 20-30 minutes  before breakfast and supper. - Should she gets approved for [place], the pt will need additional prandial insulin and we discussed using the ReliOn regular insulin, she will keep me posted.  - We again emphasized the importance of avoiding sugar-sweetened beverages and avoiding snacks when possible. Pt is motivated to start using the pool.      MEDICATIONS:  Continue Metformin 750 mg BID  Stop Tresiba   Start Novolin (ReliOn)  50 units before breakfast and Supper   EDUCATION / INSTRUCTIONS:  BG monitoring instructions: Patient is instructed to check her blood sugars 2 times a day, before breakfast and supper. Marland Kitchen  Literature supplied for  Rule of 15 for the treatment of hypoglycemia.    F/U in 3 months    Signed electronically by: Mack Guise, MD  [facility]  [facility] [address] [place], [place] [address] Phone:  [phone] FAX: [phone]   CC: Florinda Marker [address] [facility] [address] [place] [address] Phone: [phone]  Fax: [phone]  Return to Endocrinology clinic as below: Future Appointments  Date Time Provider [place]  05/25/2020  2:00 PM Lelon Perla, MD [facility] None

## 2020-03-25 DIAGNOSIS — R000 Tachycardia, unspecified: Secondary | ICD-10-CM | POA: Diagnosis not present

## 2020-03-25 DIAGNOSIS — F419 Anxiety disorder, unspecified: Secondary | ICD-10-CM | POA: Diagnosis not present

## 2020-03-25 DIAGNOSIS — R778 Other specified abnormalities of plasma proteins: Secondary | ICD-10-CM | POA: Diagnosis not present

## 2020-03-25 DIAGNOSIS — Z20822 Contact with and (suspected) exposure to covid-19: Secondary | ICD-10-CM | POA: Diagnosis not present

## 2020-03-25 DIAGNOSIS — E1110 Type 2 diabetes mellitus with ketoacidosis without coma: Secondary | ICD-10-CM | POA: Diagnosis not present

## 2020-03-25 DIAGNOSIS — R079 Chest pain, unspecified: Secondary | ICD-10-CM | POA: Diagnosis not present

## 2020-03-25 DIAGNOSIS — I10 Essential (primary) hypertension: Secondary | ICD-10-CM | POA: Diagnosis not present

## 2020-03-25 DIAGNOSIS — Z79899 Other long term (current) drug therapy: Secondary | ICD-10-CM | POA: Diagnosis not present

## 2020-03-25 DIAGNOSIS — Z794 Long term (current) use of insulin: Secondary | ICD-10-CM | POA: Diagnosis not present

## 2020-03-25 DIAGNOSIS — R748 Abnormal levels of other serum enzymes: Secondary | ICD-10-CM | POA: Diagnosis not present

## 2020-03-26 ENCOUNTER — Other Ambulatory Visit: Payer: Self-pay

## 2020-03-26 ENCOUNTER — Observation Stay (HOSPITAL_BASED_OUTPATIENT_CLINIC_OR_DEPARTMENT_OTHER): Payer: Medicare PPO

## 2020-03-26 ENCOUNTER — Observation Stay (HOSPITAL_COMMUNITY)
Admission: AD | Admit: 2020-03-26 | Discharge: 2020-03-27 | Disposition: A | Discharge status: Home / Self Care | Payer: Medicare PPO | Source: Other Acute Inpatient Hospital | Attending: Internal Medicine | Admitting: Internal Medicine

## 2020-03-26 ENCOUNTER — Observation Stay (HOSPITAL_COMMUNITY): Payer: Medicare PPO

## 2020-03-26 ENCOUNTER — Encounter (HOSPITAL_COMMUNITY): Payer: Self-pay | Admitting: Family Medicine

## 2020-03-26 DIAGNOSIS — Z86010 Personal history of colonic polyps: Secondary | ICD-10-CM | POA: Diagnosis not present

## 2020-03-26 DIAGNOSIS — K760 Fatty (change of) liver, not elsewhere classified: Secondary | ICD-10-CM | POA: Diagnosis not present

## 2020-03-26 DIAGNOSIS — I671 Cerebral aneurysm, nonruptured: Secondary | ICD-10-CM | POA: Diagnosis not present

## 2020-03-26 DIAGNOSIS — Z888 Allergy status to other drugs, medicaments and biological substances status: Secondary | ICD-10-CM | POA: Insufficient documentation

## 2020-03-26 DIAGNOSIS — E1140 Type 2 diabetes mellitus with diabetic neuropathy, unspecified: Secondary | ICD-10-CM | POA: Insufficient documentation

## 2020-03-26 DIAGNOSIS — M797 Fibromyalgia: Secondary | ICD-10-CM | POA: Insufficient documentation

## 2020-03-26 DIAGNOSIS — Q245 Malformation of coronary vessels: Secondary | ICD-10-CM | POA: Insufficient documentation

## 2020-03-26 DIAGNOSIS — I10 Essential (primary) hypertension: Secondary | ICD-10-CM | POA: Diagnosis present

## 2020-03-26 DIAGNOSIS — Z7901 Long term (current) use of anticoagulants: Secondary | ICD-10-CM | POA: Insufficient documentation

## 2020-03-26 DIAGNOSIS — E1110 Type 2 diabetes mellitus with ketoacidosis without coma: Secondary | ICD-10-CM | POA: Insufficient documentation

## 2020-03-26 DIAGNOSIS — R011 Cardiac murmur, unspecified: Secondary | ICD-10-CM | POA: Insufficient documentation

## 2020-03-26 DIAGNOSIS — N183 Chronic kidney disease, stage 3 unspecified: Secondary | ICD-10-CM | POA: Insufficient documentation

## 2020-03-26 DIAGNOSIS — R079 Chest pain, unspecified: Secondary | ICD-10-CM

## 2020-03-26 DIAGNOSIS — F329 Major depressive disorder, single episode, unspecified: Secondary | ICD-10-CM | POA: Diagnosis not present

## 2020-03-26 DIAGNOSIS — I152 Hypertension secondary to endocrine disorders: Secondary | ICD-10-CM | POA: Diagnosis present

## 2020-03-26 DIAGNOSIS — J45909 Unspecified asthma, uncomplicated: Secondary | ICD-10-CM | POA: Insufficient documentation

## 2020-03-26 DIAGNOSIS — F419 Anxiety disorder, unspecified: Secondary | ICD-10-CM | POA: Insufficient documentation

## 2020-03-26 DIAGNOSIS — Z79899 Other long term (current) drug therapy: Secondary | ICD-10-CM | POA: Insufficient documentation

## 2020-03-26 DIAGNOSIS — M1990 Unspecified osteoarthritis, unspecified site: Secondary | ICD-10-CM | POA: Diagnosis not present

## 2020-03-26 DIAGNOSIS — E785 Hyperlipidemia, unspecified: Secondary | ICD-10-CM | POA: Diagnosis not present

## 2020-03-26 DIAGNOSIS — G43909 Migraine, unspecified, not intractable, without status migrainosus: Secondary | ICD-10-CM | POA: Insufficient documentation

## 2020-03-26 DIAGNOSIS — E1165 Type 2 diabetes mellitus with hyperglycemia: Secondary | ICD-10-CM | POA: Insufficient documentation

## 2020-03-26 DIAGNOSIS — Z8249 Family history of ischemic heart disease and other diseases of the circulatory system: Secondary | ICD-10-CM | POA: Insufficient documentation

## 2020-03-26 DIAGNOSIS — R002 Palpitations: Secondary | ICD-10-CM | POA: Insufficient documentation

## 2020-03-26 DIAGNOSIS — Z885 Allergy status to narcotic agent status: Secondary | ICD-10-CM | POA: Insufficient documentation

## 2020-03-26 DIAGNOSIS — K219 Gastro-esophageal reflux disease without esophagitis: Secondary | ICD-10-CM | POA: Diagnosis not present

## 2020-03-26 DIAGNOSIS — Z6835 Body mass index (BMI) 35.0-35.9, adult: Secondary | ICD-10-CM | POA: Insufficient documentation

## 2020-03-26 DIAGNOSIS — Z9049 Acquired absence of other specified parts of digestive tract: Secondary | ICD-10-CM | POA: Insufficient documentation

## 2020-03-26 DIAGNOSIS — E1122 Type 2 diabetes mellitus with diabetic chronic kidney disease: Secondary | ICD-10-CM | POA: Diagnosis not present

## 2020-03-26 DIAGNOSIS — Z8349 Family history of other endocrine, nutritional and metabolic diseases: Secondary | ICD-10-CM | POA: Insufficient documentation

## 2020-03-26 DIAGNOSIS — E1159 Type 2 diabetes mellitus with other circulatory complications: Secondary | ICD-10-CM | POA: Diagnosis present

## 2020-03-26 DIAGNOSIS — Z87891 Personal history of nicotine dependence: Secondary | ICD-10-CM | POA: Insufficient documentation

## 2020-03-26 DIAGNOSIS — K589 Irritable bowel syndrome without diarrhea: Secondary | ICD-10-CM | POA: Insufficient documentation

## 2020-03-26 DIAGNOSIS — H547 Unspecified visual loss: Secondary | ICD-10-CM | POA: Diagnosis not present

## 2020-03-26 DIAGNOSIS — Z794 Long term (current) use of insulin: Secondary | ICD-10-CM | POA: Diagnosis not present

## 2020-03-26 DIAGNOSIS — I129 Hypertensive chronic kidney disease with stage 1 through stage 4 chronic kidney disease, or unspecified chronic kidney disease: Secondary | ICD-10-CM | POA: Diagnosis not present

## 2020-03-26 DIAGNOSIS — Z833 Family history of diabetes mellitus: Secondary | ICD-10-CM | POA: Insufficient documentation

## 2020-03-26 DIAGNOSIS — Z20822 Contact with and (suspected) exposure to covid-19: Secondary | ICD-10-CM | POA: Diagnosis not present

## 2020-03-26 DIAGNOSIS — Z841 Family history of disorders of kidney and ureter: Secondary | ICD-10-CM | POA: Insufficient documentation

## 2020-03-26 DIAGNOSIS — Z811 Family history of alcohol abuse and dependence: Secondary | ICD-10-CM | POA: Insufficient documentation

## 2020-03-26 DIAGNOSIS — Z90710 Acquired absence of both cervix and uterus: Secondary | ICD-10-CM | POA: Insufficient documentation

## 2020-03-26 DIAGNOSIS — Z803 Family history of malignant neoplasm of breast: Secondary | ICD-10-CM | POA: Insufficient documentation

## 2020-03-26 DIAGNOSIS — I259 Chronic ischemic heart disease, unspecified: Secondary | ICD-10-CM | POA: Diagnosis not present

## 2020-03-26 DIAGNOSIS — Z791 Long term (current) use of non-steroidal anti-inflammatories (NSAID): Secondary | ICD-10-CM | POA: Insufficient documentation

## 2020-03-26 HISTORY — DX: Chest pain, unspecified: R07.9

## 2020-03-26 LAB — CBC
HCT: 41.5 % (ref 36.0–46.0)
Hemoglobin: 13.8 g/dL (ref 12.0–15.0)
MCH: 28.6 pg (ref 26.0–34.0)
MCHC: 33.3 g/dL (ref 30.0–36.0)
MCV: 85.9 fL (ref 80.0–100.0)
Platelets: 176 10*3/uL (ref 150–400)
RBC: 4.83 MIL/uL (ref 3.87–5.11)
RDW: 13.2 % (ref 11.5–15.5)
WBC: 6.8 10*3/uL (ref 4.0–10.5)
nRBC: 0 % (ref 0.0–0.2)

## 2020-03-26 LAB — TSH: TSH: 2.479 u[IU]/mL (ref 0.350–4.500)

## 2020-03-26 LAB — CBC WITH DIFFERENTIAL/PLATELET
Abs Immature Granulocytes: 0.02 10*3/uL (ref 0.00–0.07)
Basophils Absolute: 0 10*3/uL (ref 0.0–0.1)
Basophils Relative: 1 %
Eosinophils Absolute: 0.1 10*3/uL (ref 0.0–0.5)
Eosinophils Relative: 1 %
HCT: 40.3 % (ref 36.0–46.0)
Hemoglobin: 13.4 g/dL (ref 12.0–15.0)
Immature Granulocytes: 0 %
Lymphocytes Relative: 21 %
Lymphs Abs: 1.5 10*3/uL (ref 0.7–4.0)
MCH: 28.3 pg (ref 26.0–34.0)
MCHC: 33.3 g/dL (ref 30.0–36.0)
MCV: 85 fL (ref 80.0–100.0)
Monocytes Absolute: 0.5 10*3/uL (ref 0.1–1.0)
Monocytes Relative: 7 %
Neutro Abs: 5.2 10*3/uL (ref 1.7–7.7)
Neutrophils Relative %: 70 %
Platelets: 173 10*3/uL (ref 150–400)
RBC: 4.74 MIL/uL (ref 3.87–5.11)
RDW: 13.2 % (ref 11.5–15.5)
WBC: 7.4 10*3/uL (ref 4.0–10.5)
nRBC: 0 % (ref 0.0–0.2)

## 2020-03-26 LAB — RESPIRATORY PANEL BY RT PCR (FLU A&B, COVID)
Influenza A by PCR: NEGATIVE
Influenza B by PCR: NEGATIVE
SARS Coronavirus 2 by RT PCR: NEGATIVE

## 2020-03-26 LAB — MAGNESIUM: Magnesium: 1.6 mg/dL — ABNORMAL LOW (ref 1.7–2.4)

## 2020-03-26 LAB — D-DIMER, QUANTITATIVE: D-Dimer, Quant: 0.34 ug/mL-FEU (ref 0.00–0.50)

## 2020-03-26 LAB — BASIC METABOLIC PANEL
Anion gap: 11 (ref 5–15)
Anion gap: 11 (ref 5–15)
BUN: 14 mg/dL (ref 8–23)
BUN: 10 mg/dL (ref 8–23)
CO2: 23 mmol/L (ref 22–32)
CO2: 23 mmol/L (ref 22–32)
Calcium: 9 mg/dL (ref 8.9–10.3)
Calcium: 8.8 mg/dL — ABNORMAL LOW (ref 8.9–10.3)
Chloride: 105 mmol/L (ref 98–111)
Chloride: 103 mmol/L (ref 98–111)
Creatinine, Ser: 1.04 mg/dL — ABNORMAL HIGH (ref 0.44–1.00)
Creatinine, Ser: 0.98 mg/dL (ref 0.44–1.00)
GFR calc Af Amer: >60 mL/min (ref >60)
GFR calc Af Amer: >60 mL/min (ref >60)
GFR calc non Af Amer: 58 mL/min — ABNORMAL LOW (ref >60)
GFR calc non Af Amer: >60 mL/min (ref >60)
Glucose, Bld: 186 mg/dL — ABNORMAL HIGH (ref 70–99)
Glucose, Bld: 195 mg/dL — ABNORMAL HIGH (ref 70–99)
Potassium: 3.8 mmol/L (ref 3.5–5.1)
Potassium: 4.2 mmol/L (ref 3.5–5.1)
Sodium: 139 mmol/L (ref 135–145)
Sodium: 137 mmol/L (ref 135–145)

## 2020-03-26 LAB — GLUCOSE, CAPILLARY
Glucose-Capillary: 223 mg/dL — ABNORMAL HIGH (ref 70–99)
Glucose-Capillary: 196 mg/dL — ABNORMAL HIGH (ref 70–99)
Glucose-Capillary: 215 mg/dL — ABNORMAL HIGH (ref 70–99)
Glucose-Capillary: 237 mg/dL — ABNORMAL HIGH (ref 70–99)
Glucose-Capillary: 335 mg/dL — ABNORMAL HIGH (ref 70–99)

## 2020-03-26 LAB — HEPATIC FUNCTION PANEL
ALT: 41 U/L (ref 0–44)
AST: 23 U/L (ref 15–41)
Albumin: 3.8 g/dL (ref 3.5–5.0)
Alkaline Phosphatase: 79 U/L (ref 38–126)
Bilirubin, Direct: <0.1 mg/dL (ref 0.0–0.2)
Total Bilirubin: 0.6 mg/dL (ref 0.3–1.2)
Total Protein: 6.5 g/dL (ref 6.5–8.1)

## 2020-03-26 LAB — ECHOCARDIOGRAM COMPLETE
Height: 62 in
Weight: 3114.66 oz

## 2020-03-26 LAB — TYPE AND SCREEN
ABO/RH(D): O NEG
Antibody Screen: NEGATIVE

## 2020-03-26 LAB — ABO/RH: ABO/RH(D): O NEG

## 2020-03-26 LAB — TROPONIN I (HIGH SENSITIVITY)
Troponin I (High Sensitivity): 13 ng/L (ref <18)
Troponin I (High Sensitivity): 12 ng/L (ref <18)

## 2020-03-26 LAB — CREATININE, SERUM
Creatinine, Ser: 1.02 mg/dL — ABNORMAL HIGH (ref 0.44–1.00)
GFR calc Af Amer: >60 mL/min (ref >60)
GFR calc non Af Amer: 59 mL/min — ABNORMAL LOW (ref >60)

## 2020-03-26 LAB — HIV ANTIBODY (ROUTINE TESTING W REFLEX): HIV Screen 4th Generation wRfx: NONREACTIVE

## 2020-03-26 MED ORDER — GABAPENTIN 300 MG PO CAPS
300.0000 mg | ORAL_CAPSULE | Freq: Three times a day (TID) | ORAL | Status: DC
  Administered 2020-03-26 – 2020-03-27 (×4): 300 mg via ORAL
  Filled 2020-03-26 (×4): qty 1

## 2020-03-26 MED ORDER — IOHEXOL 350 MG/ML SOLN
80.0000 mL | Freq: Once | INTRAVENOUS | Status: AC | PRN
  Administered 2020-03-26: 80 mL via INTRAVENOUS

## 2020-03-26 MED ORDER — NITROGLYCERIN 0.4 MG SL SUBL: 0.4000 mg | SUBLINGUAL_TABLET | SUBLINGUAL | Status: DC | PRN

## 2020-03-26 MED ORDER — METOPROLOL TARTRATE 25 MG PO TABS
25.0000 mg | ORAL_TABLET | Freq: Two times a day (BID) | ORAL | Status: DC
  Administered 2020-03-26 – 2020-03-27 (×2): 25 mg via ORAL
  Filled 2020-03-26 (×3): qty 1

## 2020-03-26 MED ORDER — INSULIN ASPART 100 UNIT/ML ~~LOC~~ SOLN
0.0000 [IU] | Freq: Three times a day (TID) | SUBCUTANEOUS | Status: DC
  Administered 2020-03-26: 3 [IU] via SUBCUTANEOUS
  Administered 2020-03-26: 2 [IU] via SUBCUTANEOUS
  Administered 2020-03-26 – 2020-03-27 (×3): 3 [IU] via SUBCUTANEOUS

## 2020-03-26 MED ORDER — INSULIN ASPART PROT & ASPART (70-30 MIX) 100 UNIT/ML ~~LOC~~ SUSP
50.0000 [IU] | Freq: Two times a day (BID) | SUBCUTANEOUS | Status: DC
  Administered 2020-03-26 – 2020-03-27 (×2): 50 [IU] via SUBCUTANEOUS
  Filled 2020-03-26: qty 10

## 2020-03-26 MED ORDER — NITROGLYCERIN 0.4 MG SL SUBL
SUBLINGUAL_TABLET | SUBLINGUAL | Status: AC
  Administered 2020-03-26: 0.4 mg
  Filled 2020-03-26: qty 2

## 2020-03-26 MED ORDER — LORAZEPAM 0.5 MG PO TABS: 0.5000 mg | ORAL_TABLET | Freq: Every day | ORAL | Status: DC | PRN

## 2020-03-26 MED ORDER — LISINOPRIL 10 MG PO TABS: 10.0000 mg | ORAL_TABLET | Freq: Every day | ORAL | Status: DC

## 2020-03-26 MED ORDER — LISINOPRIL 5 MG PO TABS
5.0000 mg | ORAL_TABLET | Freq: Every morning | ORAL | Status: DC
  Administered 2020-03-27: 5 mg via ORAL
  Filled 2020-03-26 (×2): qty 1

## 2020-03-26 MED ORDER — METOPROLOL TARTRATE 50 MG PO TABS
50.0000 mg | ORAL_TABLET | Freq: Once | ORAL | Status: AC
  Administered 2020-03-26: 50 mg via ORAL
  Filled 2020-03-26: qty 1

## 2020-03-26 MED ORDER — ONDANSETRON HCL 4 MG PO TABS: 4.0000 mg | ORAL_TABLET | Freq: Four times a day (QID) | ORAL | Status: DC | PRN

## 2020-03-26 MED ORDER — ONDANSETRON HCL 4 MG/2ML IJ SOLN: 4.0000 mg | Freq: Four times a day (QID) | INTRAMUSCULAR | Status: DC | PRN

## 2020-03-26 MED ORDER — MAGNESIUM SULFATE 2 GM/50ML IV SOLN
2.0000 g | Freq: Once | INTRAVENOUS | Status: AC
  Administered 2020-03-26: 2 g via INTRAVENOUS
  Filled 2020-03-26: qty 50

## 2020-03-26 MED ORDER — ATORVASTATIN CALCIUM 40 MG PO TABS
40.0000 mg | ORAL_TABLET | Freq: Every day | ORAL | Status: DC
  Administered 2020-03-26: 40 mg via ORAL
  Filled 2020-03-26: qty 1

## 2020-03-26 MED ORDER — ENOXAPARIN SODIUM 40 MG/0.4ML ~~LOC~~ SOLN
40.0000 mg | SUBCUTANEOUS | Status: DC
  Administered 2020-03-27: 40 mg via SUBCUTANEOUS
  Filled 2020-03-26 (×2): qty 0.4

## 2020-03-26 MED ORDER — AMLODIPINE BESYLATE 10 MG PO TABS
10.0000 mg | ORAL_TABLET | Freq: Every day | ORAL | Status: DC
  Administered 2020-03-27: 10 mg via ORAL
  Filled 2020-03-26 (×2): qty 1

## 2020-03-26 NOTE — H&P (Signed)
History and Physical    [name]:[id number] DOB: 1956/07/06 DOA: 03/26/2020  PCP: Emeterio Reeve, DO  Patient coming from: Patient was transferred from [facility].  Chief Complaint: Palpitation and chest discomfort.  HPI: Cassandra Reid is a 64 y.o. female with history of hypertension diabetes mellitus type 2, hyperlipidemia presents to the ER last evening around 5:00 with complaints of palpitation which happened suddenly while she was at home.  At the same time patient felt chest discomfort.  No shortness of breath.  Denies any fever chills nausea vomiting abdominal pain or diarrhea or any change in medication and she has not missed her medication.  In the ER at [facility] patient had a symptoms controlled after she was given Ativan and EKG showed normal sinus rhythm with nonspecific T changes.  Her troponin showed a slightly increasing trend with last 1 being around 16 and then normal over there was less than 14.  Patient blood sugar was around 400 with anion gap of 18 patient was started on insulin infusion for DKA.  At the time of my exam aware patient is asymptomatic and feels better.  Patient was transferred to [facility] for the need of possible cardiology consult.  Covid test at onset was negative.  Other labs are largely unremarkable.  ED Course: Patient is a direct admit.  Review of Systems: As per HPI, rest all negative.   Past Medical History:  Diagnosis Date  . Anxiety   . Arthritis   . Asthma   . Cerebral aneurysm without rupture   . Chronic kidney disease   . CKD stage 3 due to type 2 diabetes mellitus ([place]) 04/15/2019  . Depression   . Diabetes mellitus without complication ([place])   . Fibromyalgia   . Former smoker   . GERD (gastroesophageal reflux disease)   . Heart murmur   . History of chicken pox   . History of colon polyps   . Hypertension   . IBS (irritable bowel syndrome)   . Legally blind   . Migraines     Past  Surgical History:  Procedure Laterality Date  . ABDOMINAL HYSTERECTOMY  2012  . APPENDECTOMY  1972  . CEREBRAL ANEURYSM REPAIR  2004   x 2  . CHOLECYSTECTOMY  2015  . MENISCUS REPAIR Bilateral      reports that she quit smoking about 30 years ago. Her smoking use included cigarettes. She has a 3.00 pack-year smoking history. She has never used smokeless tobacco. She reports that she does not drink alcohol or use drugs.  Allergies  Allergen Reactions  . Cymbalta [Duloxetine Hcl] Other (See Comments)    Extreme joint pain and stiffness    Family History  Problem Relation Age of Onset  . Alcohol abuse Mother   . Diabetes Mother   . Hypertension Mother   . Kidney disease Mother   . COPD Father   . Diabetes Father   . Early death Father   . Heart disease Father   . Hyperlipidemia Father   . Hypertension Father   . Diabetes Sister   . Hypertension Sister   . Breast cancer Sister   . Cancer Maternal Grandmother   . Alcohol abuse Maternal Grandfather   . Cancer Maternal Grandfather   . Cancer Paternal Grandmother   . Cancer Paternal Grandfather   . Diabetes Sister   . Hypertension Sister   . Kidney disease Sister     Prior to Admission medications   Medication  Sig Start Date End Date Taking? Authorizing Provider  amLODipine (NORVASC) 10 MG tablet Take 1 tablet (10 mg total) by mouth daily. 11/18/19   Emeterio Reeve, DO  atorvastatin (LIPITOR) 40 MG tablet TAKE 1 TABLET AT BEDTIME 10/29/19   Emeterio Reeve, DO  Blood Glucose Monitoring Suppl (ACCU-CHEK AVIVA PLUS) w/Device KIT 1 each by Does not apply route as directed. E11.22 11/30/19   Emeterio Reeve, DO  chlorhexidine (PERIDEX) 0.12 % solution  08/06/18   [provider]  furosemide (LASIX) 40 MG tablet Take 1 tablet (40 mg total) by mouth daily. 11/18/19   Emeterio Reeve, DO  gabapentin (NEURONTIN) 300 MG capsule Take 1-2 capsules (300-600 mg total) by mouth 3 (three) times daily. 02/10/20 05/10/20   Emeterio Reeve, DO  glucose blood (ACCU-CHEK GUIDE) test strip Use as instructed to check blood sugar 4 times daily. Dx E11.42 12/01/19   Emeterio Reeve, DO  insulin NPH-regular Human (NOVOLIN 70/30 RELION) (70-30) 100 UNIT/ML injection Inject 50 Units into the skin 2 (two) times daily with a meal. 03/21/20   Shamleffer, Melanie Crazier, MD  Insulin Pen Needle 32G X 4 MM MISC For subcutaneous use with insulin 08/03/19   Trixie Dredge, PA-C  lactulose (CHRONULAC) 10 GM/15ML solution TAKE 15-30 MLS (10-20 G TOTAL) BY MOUTH 2 (TWO) TIMES DAILY AS NEEDED FOR MILD CONSTIPATION OR MODERATE CONSTIPATION. 01/25/20   Emeterio Reeve, DO  Lancets (ACCU-CHEK SOFT TOUCH) lancets Use as instructed to check blood sugar 4 times daily. Dx E11.42 12/01/19   Emeterio Reeve, DO  Lancets Misc. (ACCU-CHEK SOFTCLIX LANCET DEV) KIT Use as instructed to check blood sugar 4 times daily. Dx E11.42 12/01/19   Emeterio Reeve, DO  lisinopril (ZESTRIL) 5 MG tablet TAKE 1 TABLET EVERY MORNING 12/03/19   Emeterio Reeve, DO  LORazepam (ATIVAN) 0.5 MG tablet Take 1 tablet (0.5 mg total) by mouth daily as needed for anxiety. 01/29/20   Emeterio Reeve, DO  metFORMIN (GLUCOPHAGE-XR) 750 MG 24 hr tablet Take 1 tablet (750 mg total) by mouth 2 (two) times daily with a meal. 03/21/20   Shamleffer, Melanie Crazier, MD  metoprolol tartrate (LOPRESSOR) 25 MG tablet Take 1 tablet (25 mg total) by mouth 2 (two) times daily. 11/18/19   Emeterio Reeve, DO  naproxen (NAPROSYN) 375 MG tablet Take 1 tablet (375 mg total) by mouth 2 (two) times daily as needed for moderate pain. Limit use to 3 days per week 10/06/19   Emeterio Reeve, DO  omeprazole (PRILOSEC) 20 MG capsule Take 1 capsule (20 mg total) by mouth daily. 10/06/19   Emeterio Reeve, DO  potassium chloride SA (KLOR-CON) 20 MEQ tablet TAKE 1 TABLET TWICE DAILY 12/03/19   Emeterio Reeve, DO  sertraline (ZOLOFT) 50 MG tablet Take 1 tablet (50 mg total)  by mouth daily. 02/04/20   Emeterio Reeve, DO  tiZANidine (ZANAFLEX) 4 MG tablet Take 0.5 tablets (2 mg total) by mouth every 8 (eight) hours as needed for muscle spasms. Patient not taking: Reported on 03/21/2020 02/10/20 05/10/20  Emeterio Reeve, DO    Physical Exam: Constitutional: Moderately built and nourished. Vitals:   03/26/20 0315 03/26/20 0327 03/26/20 0345  BP: (!) 178/88 (!) 152/74 (!) 145/66  Pulse: 87    Resp: 18    Temp: 97.6 F (36.4 C)    TempSrc: Oral    SpO2: 98%    Weight: 88.3 kg    Height: _0  (1.575 m)     Eyes: Anicteric no pallor. ENMT: No discharge from the  ears eyes nose or mouth. Neck: No mass felt.  No neck rigidity. Respiratory: No rhonchi or crepitations. Cardiovascular: S1-S2 heard. Abdomen: Soft nontender bowel sounds present. Musculoskeletal: No edema. Skin: No rash. Neurologic: Alert awake oriented to time place and person.  Moves all extremities. Psychiatric: Appears normal per normal affect.   Labs on Admission: I have personally reviewed following labs and imaging studies  CBC: Recent Labs  Lab 03/26/20 0439  WBC 7.4  NEUTROABS 5.2  HGB 13.4  HCT 40.3  MCV 85.0  PLT 244   Basic Metabolic Panel: Recent Labs  Lab 03/26/20 0439  NA 139  K 3.8  CL 105  CO2 23  GLUCOSE 186*  BUN 14  CREATININE 1.04*  CALCIUM 9.0   GFR: Estimated Creatinine Clearance: 57.9 mL/min (A) (by C-G formula based on SCr of 1.04 mg/dL (H)). Liver Function Tests: Recent Labs  Lab 03/26/20 0439  AST 23  ALT 41  ALKPHOS 79  BILITOT 0.6  PROT 6.5  ALBUMIN 3.8   No results for input(s): LIPASE, AMYLASE in the last 168 hours. No results for input(s): AMMONIA in the last 168 hours. Coagulation Profile: No results for input(s): INR, PROTIME in the last 168 hours. Cardiac Enzymes: No results for input(s): CKTOTAL, CKMB, CKMBINDEX, TROPONINI in the last 168 hours. BNP (last 3 results) No results for input(s): PROBNP in the last 8760  hours. HbA1C: No results for input(s): HGBA1C in the last 72 hours. CBG: Recent Labs  Lab 03/26/20 0324  GLUCAP 223*   Lipid Profile: No results for input(s): CHOL, HDL, LDLCALC, TRIG, CHOLHDL, LDLDIRECT in the last 72 hours. Thyroid Function Tests: No results for input(s): TSH, T4TOTAL, FREET4, T3FREE, THYROIDAB in the last 72 hours. Anemia Panel: No results for input(s): VITAMINB12, FOLATE, FERRITIN, TIBC, IRON, RETICCTPCT in the last 72 hours. Urine analysis:    Component Value Date/Time   BILIRUBINUR negative 12/22/2019 1602   KETONESUR trace (5) (A) 12/22/2019 1602   UROBILINOGEN 0.2 12/22/2019 1602   NITRITE Negative 12/22/2019 1602   LEUKOCYTESUR Negative 12/22/2019 1602   Sepsis Labs: _0 (procalcitonin:4,lacticidven:4) )No results found for this or any previous visit (from the past 240 hour(s)).   Radiological Exams on Admission: No results found.  EKG: Independently reviewed.  Normal sinus rhythm with nonspecific cystic changes at chronic colostomy.  Assessment/Plan Principal Problem:   Chest pain Active Problems:   Hypertension   Brain aneurysm   CKD stage 3 due to type 2 diabetes mellitus (HCC)   Type 2 diabetes mellitus with hyperglycemia, with long-term current use of insulin (HCC)    1. Palpitation with chest discomfort with elevated troponin -could be related to patient's tachycardia.  However since patient has risk factors including uncontrolled diabetes hypertension hyperlipidemia with family history of CAD will need to rule out ACS.  Consult cardiology in the morning.  Will cycle cardiac markers.  Check D-dimer and TSH. 2. Early DKA improved with IV insulin and IV fluids presently anion gap is corrected will go back to her home dose of NovoLog 70/30 50 units twice daily with sliding scale coverage.  Will need diabetic counseling since hemoglobin and marked elevated. 3. Hypertension on metoprolol lisinopril.  And amlodipine. 4. Hyperlipidemia on  statins. 5. Neuropathy on gabapentin.  Covid test OU is pending.   DVT prophylaxis: Lovenox. Code Status: Full code. Family Communication: Discussed with patient. Disposition Plan: Home. Consults called: None. Admission status: Observation.   Rise Patience MD [facility] Pager [phone].  If 7PM-7AM, please  contact night-coverage [email or web address] Password [facility]  03/26/2020, 5:41 AM

## 2020-03-26 NOTE — Progress Notes (Signed)
  Echocardiogram 2D Echocardiogram has been performed.  Pieter Partridge 03/26/2020, 2:16 PM

## 2020-03-26 NOTE — Consult Note (Addendum)
Cardiology Consultation:   Patient ID: Cassandra Reid; [id number]; 1956/09/19   Admit date: 03/26/2020 Date of Consult: 03/26/2020  Primary Care Provider: Emeterio Reeve, DO Primary Cardiologist: No primary care provider on file. New Previous cardiologist was in [facility] Primary Electrophysiologist:  None   Patient Profile:   Cassandra Reid is a 64 y.o. female with a hx of DM, HTN, HLD, OA, CKD III, cerebral aneurysm, fibromyalgia, IBS, MVP, who is being seen today for the evaluation of chest pain and palpitations at the request of Dr Tyrell Antonio.  History of Present Illness:   Ms. Blumenthal has not seen cardiology since moving to [facility].  She has a physician in [place] that she saw for years.  At some point during that time, he did a regular echo, a stress echo, and a nuclear stress test.  She was told she had mitral valve prolapse.  She has had palpitations in the past, but never been diagnosed with CAD or an abnormal heart rhythm.  Yesterday, she was sitting on the couch watching TV with her dog and had sudden onset of feeling her heart pound.  She states that she had chest pain but what she really felt was her heart beating fast.  The highest rate she detected with her home equipment was 120.  Other than feeling her heart pounding, she did not have any chest pain.  Her heart was regular, but rapid.  She has had this before, but it did not resolve with home maneuvers.  She does not have anything to take when it does this.  In the emergency room, she was in sinus rhythm with no acute ischemic changes and got Ativan, her symptoms improved.  She has no history of syncope or presyncope.  She occasionally climbs stairs and walks the dog.  She has no history of exertional chest pain but admits her exertion level is not very high.  She gets chest pain on a regular basis.  She has twinges of chest pain that can be at various points on her chest.  Both right and left.  Those  chest pains she describes as twinges and are not severe.  They resolved without intervention.  1 of those points is on her lower left lateral chest and there is point tenderness there.  Of note, she had no excess of caffeine yesterday.  She drinks 1 cup of coffee daily.  She has not been using over-the-counter medications at all.  She has not been taking diet supplements.  She has not felt that she was under any increased amount of stress.  She does feel that her stress level is higher than normal for a long time.   Past Medical History:  Diagnosis Date  . Anxiety   . Arthritis   . Asthma   . Cerebral aneurysm without rupture   . Chronic kidney disease   . CKD stage 3 due to type 2 diabetes mellitus ([place]) 04/15/2019  . Depression   . Diabetes mellitus without complication ([place])   . Fibromyalgia   . Former smoker   . GERD (gastroesophageal reflux disease)   . Heart murmur   . History of chicken pox   . History of colon polyps   . Hypertension   . IBS (irritable bowel syndrome)   . Legally blind   . Migraines     Past Surgical History:  Procedure Laterality Date  . ABDOMINAL HYSTERECTOMY  2012  . APPENDECTOMY  1972  . CEREBRAL ANEURYSM REPAIR  2004   x 2  . CHOLECYSTECTOMY  2015  . MENISCUS REPAIR Bilateral      Prior to Admission medications   Medication Sig Start Date End Date Taking? Authorizing Provider  amLODipine (NORVASC) 10 MG tablet Take 1 tablet (10 mg total) by mouth daily. 11/18/19   Emeterio Reeve, DO  atorvastatin (LIPITOR) 40 MG tablet TAKE 1 TABLET AT BEDTIME 10/29/19   Emeterio Reeve, DO  Blood Glucose Monitoring Suppl (ACCU-CHEK AVIVA PLUS) w/Device KIT 1 each by Does not apply route as directed. E11.22 11/30/19   Emeterio Reeve, DO  chlorhexidine (PERIDEX) 0.12 % solution  08/06/18   [provider]  furosemide (LASIX) 40 MG tablet Take 1 tablet (40 mg total) by mouth daily. 11/18/19   Emeterio Reeve, DO  gabapentin (NEURONTIN) 300 MG  capsule Take 1-2 capsules (300-600 mg total) by mouth 3 (three) times daily. 02/10/20 05/10/20  Emeterio Reeve, DO  glucose blood (ACCU-CHEK GUIDE) test strip Use as instructed to check blood sugar 4 times daily. Dx E11.42 12/01/19   Emeterio Reeve, DO  insulin NPH-regular Human (NOVOLIN 70/30 RELION) (70-30) 100 UNIT/ML injection Inject 50 Units into the skin 2 (two) times daily with a meal. 03/21/20   Shamleffer, Melanie Crazier, MD  Insulin Pen Needle 32G X 4 MM MISC For subcutaneous use with insulin 08/03/19   Trixie Dredge, PA-C  lactulose (CHRONULAC) 10 GM/15ML solution TAKE 15-30 MLS (10-20 G TOTAL) BY MOUTH 2 (TWO) TIMES DAILY AS NEEDED FOR MILD CONSTIPATION OR MODERATE CONSTIPATION. 01/25/20   Emeterio Reeve, DO  Lancets (ACCU-CHEK SOFT TOUCH) lancets Use as instructed to check blood sugar 4 times daily. Dx E11.42 12/01/19   Emeterio Reeve, DO  Lancets Misc. (ACCU-CHEK SOFTCLIX LANCET DEV) KIT Use as instructed to check blood sugar 4 times daily. Dx E11.42 12/01/19   Emeterio Reeve, DO  lisinopril (ZESTRIL) 5 MG tablet TAKE 1 TABLET EVERY MORNING 12/03/19   Emeterio Reeve, DO  LORazepam (ATIVAN) 0.5 MG tablet Take 1 tablet (0.5 mg total) by mouth daily as needed for anxiety. 01/29/20   Emeterio Reeve, DO  metFORMIN (GLUCOPHAGE-XR) 750 MG 24 hr tablet Take 1 tablet (750 mg total) by mouth 2 (two) times daily with a meal. 03/21/20   Shamleffer, Melanie Crazier, MD  metoprolol tartrate (LOPRESSOR) 25 MG tablet Take 1 tablet (25 mg total) by mouth 2 (two) times daily. 11/18/19   Emeterio Reeve, DO  naproxen (NAPROSYN) 375 MG tablet Take 1 tablet (375 mg total) by mouth 2 (two) times daily as needed for moderate pain. Limit use to 3 days per week 10/06/19   Emeterio Reeve, DO  omeprazole (PRILOSEC) 20 MG capsule Take 1 capsule (20 mg total) by mouth daily. 10/06/19   Emeterio Reeve, DO  potassium chloride SA (KLOR-CON) 20 MEQ tablet TAKE 1 TABLET TWICE DAILY  12/03/19   Emeterio Reeve, DO  sertraline (ZOLOFT) 50 MG tablet Take 1 tablet (50 mg total) by mouth daily. 02/04/20   Emeterio Reeve, DO  tiZANidine (ZANAFLEX) 4 MG tablet Take 0.5 tablets (2 mg total) by mouth every 8 (eight) hours as needed for muscle spasms. Patient not taking: Reported on 03/21/2020 02/10/20 05/10/20  Emeterio Reeve, DO    Inpatient Medications: Scheduled Meds: . amLODipine  10 mg Oral Daily  . atorvastatin  40 mg Oral QHS  . enoxaparin (LOVENOX) injection  40 mg Subcutaneous Q24H  . gabapentin  300 mg Oral TID  . insulin aspart  0-9 Units Subcutaneous TID WC  . insulin  aspart protamine- aspart  50 Units Subcutaneous BID WC  . lisinopril  5 mg Oral q morning - 10a  . metoprolol tartrate  25 mg Oral BID   Continuous Infusions: . magnesium sulfate bolus IVPB     PRN Meds: LORazepam, ondansetron **OR** ondansetron (ZOFRAN) IV  Allergies:    Allergies  Allergen Reactions  . Cymbalta [Duloxetine Hcl] Other (See Comments)    Extreme joint pain and stiffness    Social History:   Social History   Socioeconomic History  . Marital status: Married    Spouse name: [name]  . Number of children: 3  . Years of education: Not on file  . Highest education level: Associate degree: academic program  Occupational History  . Occupation: disabled  Tobacco Use  . Smoking status: Former Smoker    Packs/day: 1.00    Years: 3.00    Pack years: 3.00    Types: Cigarettes    Quit date: 09/04/1989    Years since quitting: 30.5  . Smokeless tobacco: Never Used  Substance and Sexual Activity  . Alcohol use: Never  . Drug use: Never  . Sexual activity: Not Currently  Other Topics Concern  . Not on file  Social History Narrative   Patient is right-handed. She lives with her husband in a 2 story house. She walks the dog daily for exercise. She drinks I cup of coffee and 3 glasses of unsweet tea.   Social Determinants of Health   Financial Resource Strain:   .  Difficulty of Paying Living Expenses:   Food Insecurity:   . Worried About Charity fundraiser in the Last Year:   . Arboriculturist in the Last Year:   Transportation Needs:   . Film/video editor (Medical):   Marland Kitchen Lack of Transportation (Non-Medical):   Physical Activity:   . Days of Exercise per Week:   . Minutes of Exercise per Session:   Stress:   . Feeling of Stress :   Social Connections:   . Frequency of Communication with Friends and Family:   . Frequency of Social Gatherings with Friends and Family:   . Attends Religious Services:   . Active Member of Clubs or Organizations:   . Attends Archivist Meetings:   Marland Kitchen Marital Status:   Intimate Partner Violence:   . Fear of Current or Ex-Partner:   . Emotionally Abused:   Marland Kitchen Physically Abused:   . Sexually Abused:     Family History:   Family History  Problem Relation Age of Onset  . Alcohol abuse Mother   . Diabetes Mother   . Hypertension Mother   . Kidney disease Mother   . COPD Father   . Diabetes Father   . Early death Father   . Heart disease Father   . Hyperlipidemia Father   . Hypertension Father   . Diabetes Sister   . Hypertension Sister   . Breast cancer Sister   . Cancer Maternal Grandmother   . Alcohol abuse Maternal Grandfather   . Cancer Maternal Grandfather   . Cancer Paternal Grandmother   . Cancer Paternal Grandfather   . Diabetes Sister   . Hypertension Sister   . Kidney disease Sister    Family Status:  Family Status  Relation Name Status  . Mother  Deceased  . Father  Deceased  . Sister [place]  . MGM  Deceased  . MGF  Deceased  . PGM  Deceased  . PGF  Deceased  . Sister Dyann Ruddle Alive    ROS:  Please see the history of present illness.  All other ROS reviewed and negative.     Physical Exam/Data:   Vitals:   03/26/20 0315 03/26/20 0327 03/26/20 0345  BP: (!) 178/88 (!) 152/74 (!) 145/66  Pulse: 87    Resp: 18    Temp: 97.6 F (36.4 C)    TempSrc: Oral     SpO2: 98%    Weight: 88.3 kg    Height: '5\' 2"'$  (1.575 m)     No intake or output data in the 24 hours ending 03/26/20 0852  Last 3 Weights 03/26/2020 03/21/2020 03/15/2020  Weight (lbs) 194 lb 10.7 oz 194 lb 12.8 oz 180 lb  Weight (kg) 88.3 kg 88.361 kg 81.647 kg     Body mass index is 35.6 kg/m.   General:  Well nourished, well developed, female in no acute distress HEENT: normal Lymph: no adenopathy Neck: JVD -not elevated Endocrine:  No thryomegaly Vascular: No carotid bruits; 4/4 extremity pulses 2+  Cardiac:  normal S1, S2; RRR; minimal murmur Lungs:  clear bilaterally, no wheezing, rhonchi or rales  Abd: soft, nontender, no hepatomegaly  Ext: no edema Musculoskeletal:  No deformities, BUE and BLE strength normal and equal Skin: warm and dry  Neuro:  CNs 2-12 intact, no focal abnormalities noted Psych:  Normal affect   EKG:  The EKG was personally reviewed and demonstrates: 5/8 ECG is sinus rhythm, decreased voltage precordial leads, diffuse T wave flattening, no old available for comparison Telemetry:  Telemetry was personally reviewed and demonstrates: Sinus rhythm   CV studies:   NO records available  Laboratory Data:   Chemistry Recent Labs  Lab 03/26/20 0439 03/26/20 0553  NA 139  --   K 3.8  --   CL 105  --   CO2 23  --   GLUCOSE 186*  --   BUN 14  --   CREATININE 1.04* 1.02*  CALCIUM 9.0  --   GFRNONAA 58* 59*  GFRAA >60 >60  ANIONGAP 11  --     Lab Results  Component Value Date   ALT 41 03/26/2020   AST 23 03/26/2020   ALKPHOS 79 03/26/2020   BILITOT 0.6 03/26/2020   Hematology Recent Labs  Lab 03/26/20 0439 03/26/20 0553  WBC 7.4 6.8  RBC 4.74 4.83  HGB 13.4 13.8  HCT 40.3 41.5  MCV 85.0 85.9  MCH 28.3 28.6  MCHC 33.3 33.3  RDW 13.2 13.2  PLT 173 176   Cardiac Enzymes High Sensitivity Troponin:   Recent Labs  Lab 03/26/20 0439 03/26/20 0553  TROPONINIHS 13 12      BNPNo results for input(s): BNP, PROBNP in the last 168  hours.  DDimer No results for input(s): DDIMER in the last 168 hours. TSH:  Lab Results  Component Value Date   TSH 2.479 03/26/2020   Lipids: Lab Results  Component Value Date   CHOL 120 07/31/2019   HDL 42 (L) 07/31/2019   LDLCALC 48 07/31/2019   TRIG 242 (H) 07/31/2019   CHOLHDL 2.9 07/31/2019   HgbA1c: Lab Results  Component Value Date   HGBA1C 10.7 (A) 03/21/2020   Magnesium:  Magnesium  Date Value Ref Range Status  03/26/2020 1.6 (L) 1.7 - 2.4 mg/dL Final    Comment:    Performed at [facility], [place] [address], [place], [place] [address]     Radiology/Studies:  DG CHEST PORT 1 VIEW  Result Date: 03/26/2020 CLINICAL DATA:  Chest pain EXAM: PORTABLE CHEST 1 VIEW COMPARISON:  Apr 09, 2019 FINDINGS: The lungs are clear. Heart is borderline enlarged with pulmonary vascularity normal. No adenopathy. No pneumothorax. No bone lesions. IMPRESSION: Lungs clear.  Heart borderline enlarged. Electronically Signed   By: Lowella Grip III M.D.   On: 03/26/2020 07:52    Assessment and Plan:   1.  Chest pain: -Symptoms atypical, enzymes negative for MI and ECG nonacute -Her symptoms seem mostly related to a rapid heart rate. -Discuss with MD if ischemic evaluation indicated -We could get a Myoview today, or could get a cardiac CT as an outpatient.  2.  Palpitations: -She describes her heart rate is regular, and rapid.  It increased up to 120 from her baseline heart rate in the 60s for no discernible reason. -Consider event monitor -Consider beta-blocker increased to 50 mg twice daily or 25 mg 3 times daily   3.  Mitral valve prolapse: -She states she has had an echocardiogram and was told that she had this -She says she has had a murmur for years -The murmur is not loud, no extra heart sounds noted -Check an echocardiogram and follow-up on results  Otherwise, per IM Principal Problem:   Chest pain Active Problems:   Hypertension   Brain aneurysm   CKD stage  3 due to type 2 diabetes mellitus ([place])   Type 2 diabetes mellitus with hyperglycemia, with long-term current use of insulin ([place])   For questions or updates, please contact [place] Please consult [email or web address] for contact info under Cardiology/STEMI.   Jonetta Speak, PA-C  03/26/2020 8:52 AM  Cardiology Attending  Patient seen and examined. Reviewed with Rosaria Ferries, PA-C. The patient has multiple cardiac risk factors including a father who died of an MI at 29. She had the sensation that her heart was beating hard and fast. She presented to the ED where evaluation has been unremarkable although she has non-specific STT abnormality on her ECG. She has DM/HTN/dyslipidemia/and remote tobacco use. I would recommend she obtain a Coronary CT scan and a 14 day zio monitor. If we can obtain the CT scan today we will otherwise we will obtain as an outpatient.   Mikle Bosworth.D.

## 2020-03-26 NOTE — Progress Notes (Signed)
Patient seen and examined, HPI reviewed.  She is chest pain-free.  He reports episode of palpitation and chest pain at home.  She has a history of mitral valve prolapse.  1-Chest pain, Palpitation;  Chest x ary negative Troponin negative.  Cardiology consulted, plan for Coronary CT Might need holter.  D dimer negative  2-Early DKA: Resolved continue with 7030 3-Hypertension: Continue with metoprolol, lisinopril and amlodipine. 4-screening for Covid test negative

## 2020-03-27 ENCOUNTER — Other Ambulatory Visit: Payer: Self-pay | Admitting: Cardiology

## 2020-03-27 DIAGNOSIS — R000 Tachycardia, unspecified: Secondary | ICD-10-CM

## 2020-03-27 DIAGNOSIS — R002 Palpitations: Secondary | ICD-10-CM

## 2020-03-27 DIAGNOSIS — I259 Chronic ischemic heart disease, unspecified: Secondary | ICD-10-CM | POA: Diagnosis not present

## 2020-03-27 DIAGNOSIS — I129 Hypertensive chronic kidney disease with stage 1 through stage 4 chronic kidney disease, or unspecified chronic kidney disease: Secondary | ICD-10-CM | POA: Diagnosis not present

## 2020-03-27 DIAGNOSIS — E1140 Type 2 diabetes mellitus with diabetic neuropathy, unspecified: Secondary | ICD-10-CM | POA: Diagnosis not present

## 2020-03-27 DIAGNOSIS — R079 Chest pain, unspecified: Secondary | ICD-10-CM | POA: Diagnosis not present

## 2020-03-27 DIAGNOSIS — Z20822 Contact with and (suspected) exposure to covid-19: Secondary | ICD-10-CM | POA: Diagnosis not present

## 2020-03-27 DIAGNOSIS — N183 Chronic kidney disease, stage 3 unspecified: Secondary | ICD-10-CM | POA: Diagnosis not present

## 2020-03-27 DIAGNOSIS — I671 Cerebral aneurysm, nonruptured: Secondary | ICD-10-CM | POA: Diagnosis not present

## 2020-03-27 DIAGNOSIS — E1110 Type 2 diabetes mellitus with ketoacidosis without coma: Secondary | ICD-10-CM | POA: Diagnosis not present

## 2020-03-27 DIAGNOSIS — E1122 Type 2 diabetes mellitus with diabetic chronic kidney disease: Secondary | ICD-10-CM | POA: Diagnosis not present

## 2020-03-27 LAB — MAGNESIUM: Magnesium: 1.8 mg/dL (ref 1.7–2.4)

## 2020-03-27 LAB — GLUCOSE, CAPILLARY
Glucose-Capillary: 227 mg/dL — ABNORMAL HIGH (ref 70–99)
Glucose-Capillary: 222 mg/dL — ABNORMAL HIGH (ref 70–99)

## 2020-03-27 MED ORDER — MAGNESIUM OXIDE 400 (241.3 MG) MG PO TABS
400.0000 mg | ORAL_TABLET | Freq: Two times a day (BID) | ORAL | Status: DC
  Administered 2020-03-27: 400 mg via ORAL
  Filled 2020-03-27: qty 1

## 2020-03-27 MED ORDER — MAGNESIUM OXIDE 400 (241.3 MG) MG PO TABS: 400.0000 mg | ORAL_TABLET | Freq: Two times a day (BID) | ORAL | 0 refills | Status: DC

## 2020-03-27 NOTE — Care Management Obs Status (Signed)
MEDICARE OBSERVATION STATUS NOTIFICATION   Patient Details  Name: LEANNA HAMID MRN: [id number] Date of Birth: 08/27/1956   Medicare Observation Status Notification Given:  Yes    Lawerance Sabal, RN 03/27/2020, 10:22 AM

## 2020-03-27 NOTE — Discharge Summary (Signed)
Physician Discharge Summary  Cassandra Reid ZOX:[id number] DOB: [birth year] DOA: 03/26/2020  PCP: Emeterio Reeve, DO  Admit date: 03/26/2020 Discharge date: 03/27/2020  Admitted From: Home  Disposition: Home  Recommendations for Outpatient Follow-up:  1. Follow up with PCP in 1-2 weeks 2. Please obtain BMP/CBC in one week 3. Cardiology will arrange Heart Monitor  Discharge Condition: Stable CODE STATUS: Full code Diet recommendation: Carb Modified  Brief/Interim Summary: Cassandra Reid is a 64 y.o. female with history of hypertension diabetes mellitus type 2, hyperlipidemia presents to the ER last evening around 5:00 with complaints of palpitation which happened suddenly while she was at home.  At the same time patient felt chest discomfort.  No shortness of breath.  Denies any fever chills nausea vomiting abdominal pain or diarrhea or any change in medication and she has not missed her medication.  In the ER at [facility] patient had a symptoms controlled after she was given Ativan and EKG showed normal sinus rhythm with nonspecific T changes.  Her troponin showed a slightly increasing trend with last 1 being around 16 and then normal over there was less than 14.  Patient blood sugar was around 400 with anion gap of 18 patient was started on insulin infusion for DKA.  At the time of my exam aware patient is asymptomatic and feels better.  Patient was transferred to [facility] for the need of possible cardiology consult.  Covid test at onset was negative.  Other labs are largely unremarkable.   1-Chest pain, Palpitation;  Chest x ary negative Troponin negative.  Cardiology consulted, plan for Coronary CT. CT negative Cardiology will arrange Holter D dimer negative Stable for discharge ECHO no evidence of mitral valve prolapse.   2-Early DKA: Resolved continue with 70/30. Needs to continue to work on diet 3-Hypertension: Continue with metoprolol, lisinopril and  amlodipine. 4-screening for Covid test negative  Discharge Diagnoses:  Principal Problem:   Chest pain Active Problems:   Hypertension   Brain aneurysm   CKD stage 3 due to type 2 diabetes mellitus ([place])   Type 2 diabetes mellitus with hyperglycemia, with long-term current use of insulin [facility])    Discharge Instructions  Discharge Instructions    Diet Carb Modified   Complete by: As directed    Increase activity slowly   Complete by: As directed      Allergies as of 03/27/2020      Reactions   Cymbalta [duloxetine Hcl] Other (See Comments)   Extreme joint pain and stiffness      Medication List    STOP taking these medications   naproxen 375 MG tablet Commonly known as: NAPROSYN   tiZANidine 4 MG tablet Commonly known as: ZANAFLEX     TAKE these medications   Accu-Chek Aviva Plus w/Device Kit 1 each by Does not apply route as directed. E11.22   Accu-Chek Guide test strip Generic drug: glucose blood Use as instructed to check blood sugar 4 times daily. Dx E11.42   accu-chek soft touch lancets Use as instructed to check blood sugar 4 times daily. Dx E11.42   Accu-Chek Softclix Lancet Dev Kit Use as instructed to check blood sugar 4 times daily. Dx E11.42   amLODipine 10 MG tablet Commonly known as: NORVASC Take 1 tablet (10 mg total) by mouth daily.   atorvastatin 40 MG tablet Commonly known as: LIPITOR TAKE 1 TABLET AT BEDTIME   fluticasone 50 MCG/ACT nasal spray Commonly known as: FLONASE Place 1 spray into both nostrils  daily as needed for allergies or rhinitis.   furosemide 40 MG tablet Commonly known as: LASIX Take 1 tablet (40 mg total) by mouth daily.   gabapentin 300 MG capsule Commonly known as: NEURONTIN Take 1-2 capsules (300-600 mg total) by mouth 3 (three) times daily.   Insulin Pen Needle 32G X 4 MM Misc For subcutaneous use with insulin   lactulose 10 GM/15ML solution Commonly known as: CHRONULAC TAKE 15-30 MLS (10-20 G TOTAL) BY  MOUTH 2 (TWO) TIMES DAILY AS NEEDED FOR MILD CONSTIPATION OR MODERATE CONSTIPATION.   lisinopril 5 MG tablet Commonly known as: ZESTRIL TAKE 1 TABLET EVERY MORNING What changed: when to take this   LORazepam 0.5 MG tablet Commonly known as: ATIVAN Take 1 tablet (0.5 mg total) by mouth daily as needed for anxiety.   magnesium oxide 400 (241.3 Mg) MG tablet Commonly known as: MAG-OX Take 1 tablet (400 mg total) by mouth 2 (two) times daily.   metFORMIN 750 MG 24 hr tablet Commonly known as: GLUCOPHAGE-XR Take 1 tablet (750 mg total) by mouth 2 (two) times daily with a meal.   metoprolol tartrate 25 MG tablet Commonly known as: LOPRESSOR Take 1 tablet (25 mg total) by mouth 2 (two) times daily.   MULTIVITAMIN PO Take 1 tablet by mouth daily.   NovoLIN 70/30 ReliOn (70-30) 100 UNIT/ML injection Generic drug: insulin NPH-regular Human Inject 50 Units into the skin 2 (two) times daily with a meal.   omeprazole 20 MG capsule Commonly known as: PRILOSEC Take 1 capsule (20 mg total) by mouth daily. What changed:   when to take this  reasons to take this   potassium chloride SA 20 MEQ tablet Commonly known as: KLOR-CON TAKE 1 TABLET TWICE DAILY   sertraline 50 MG tablet Commonly known as: ZOLOFT Take 1 tablet (50 mg total) by mouth daily.   VITAMIN C PO Take 1 tablet by mouth daily.       Allergies  Allergen Reactions  . Cymbalta [Duloxetine Hcl] Other (See Comments)    Extreme joint pain and stiffness    Consultations: Cardiology   Procedures/Studies: CT CORONARY MORPH W/CTA COR W/SCORE W/CA W/CM &/OR WO/CM  Result Date: 03/26/2020 HISTORY: 64 yo female with chest pain/anginal equiv, 24yrCHD risk 10-20% EXAM: Cardiac/Coronary CTA TECHNIQUE: The patient was scanned on a [employer] PROTOCOL: A 120 kV prospective scan was triggered in the descending thoracic aorta at 111 HU's. Axial non-contrast 3 mm slices were carried out through the heart. The data  set was analyzed on a dedicated work station and scored using the [employer] Gantry rotation speed was 250 msecs and collimation was .6 mm. Beta blockade and 0.8 mg of sl NTG was given. The 3D data set was reconstructed in 5% intervals of the 67-82 % of the R-R cycle. Diastolic phases were analyzed on a dedicated work station using MPR, MIP and VRT modes. The patient received 867mOMNIPAQUE IOHEXOL 350 MG/ML SOLN of contrast. FINDINGS: Quality: Good (HR 72) Coronary calcium score: The patient's coronary artery calcium score is 0, which places the patient in the 0 percentile. Coronary arteries: Normal coronary origins.  Right dominance. Right Coronary Artery: Dominant vessel.  No significant stenosis. Left Main Coronary Artery: Normal. Bifurcates into the LAD and LCx arteries. Left Anterior Descending Coronary Artery: Tortuous vessel that does not reach the apex. No stenosis. 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 Ramus Intermedius: Moderate-sized branch without stenosis. Left Circumflex Artery:  Large caliber AV groove vessel without stenosis. Aorta: Normal size, 28 mm at the mid ascending aorta (level of the PA bifurcation) measured double oblique. No calcifications. No dissection. Aortic Valve: Trileaflet.  No calcifications. Other findings: Normal pulmonary vein drainage into the left atrium. Normal left atrial appendage without a thrombus. Normal size of the pulmonary artery. IMPRESSION: 1. No evidence of CAD, CADRADS = 0. 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 2. Coronary calcium score of 0. 3. Normal coronary origin with right dominance. Electronically Signed   By: Pixie Casino M.D.   On: 03/26/2020 17:28   DG CHEST PORT 1 VIEW  Result Date: 03/26/2020 CLINICAL DATA:  Chest pain EXAM: PORTABLE CHEST 1 VIEW COMPARISON:  Apr 09, 2019 FINDINGS: The lungs are clear. Heart is borderline enlarged with pulmonary vascularity normal. No  adenopathy. No pneumothorax. No bone lesions. IMPRESSION: Lungs clear.  Heart borderline enlarged. Electronically Signed   By: Lowella Grip III M.D.   On: 03/26/2020 07:52   ECHOCARDIOGRAM COMPLETE  Result Date: 03/26/2020    ECHOCARDIOGRAM REPORT   Patient Name:   Cassandra Reid Date of Exam: 03/26/2020 Medical Rec #:  [id number]      Height:       62.0 in Accession #:    [id number]     Weight:       194.7 lb Date of Birth:  1956-03-19     BSA:          1.890 m Patient Age:    34 years       BP:           172/67 mmHg Patient Gender: F              HR:           72 bpm. Exam Location:  Inpatient Procedure: 2D Echo, Cardiac Doppler and Color Doppler Indications:    Chest pain  History:        Patient has no prior history of Echocardiogram examinations.                 Morbid obesity, Signs/Symptoms:Chest Pain; Risk                 Factors:Diabetes, Hypertension and Dyslipidemia.  Sonographer:    Dustin Flock Referring Phys: 3 RHONDA G BARRETT  Sonographer Comments: Patient is morbidly obese. Image acquisition challenging due to patient body habitus. IMPRESSIONS  1. Left ventricular ejection fraction, by estimation, is 65 to 70%. The left ventricle has normal function. The left ventricle has no regional wall motion abnormalities. There is mild left ventricular hypertrophy. Left ventricular diastolic parameters are consistent with Grade I diastolic dysfunction (impaired relaxation).  2. Right ventricular systolic function is low normal. The right ventricular size is normal.  3. Left atrial size was mildly dilated.  4. The mitral valve is grossly normal. Trivial mitral valve regurgitation.  5. The aortic valve is tricuspid. Aortic valve regurgitation is not visualized. FINDINGS  Left Ventricle: Left ventricular ejection fraction, by estimation, is 65 to 70%. The left ventricle has normal function. The left ventricle has no regional wall motion abnormalities. The left ventricular internal cavity size was  normal in size. There is  mild left ventricular hypertrophy. Left ventricular diastolic parameters are consistent with Grade I diastolic dysfunction (impaired relaxation). Indeterminate filling pressures. Right Ventricle: The right ventricular size is normal. No increase in right ventricular wall thickness. Right ventricular systolic function is low normal.  Left Atrium: Left atrial size was mildly dilated. Right Atrium: Right atrial size was normal in size. Pericardium: There is no evidence of pericardial effusion. Mitral Valve: The mitral valve is grossly normal. Trivial mitral valve regurgitation. Tricuspid Valve: The tricuspid valve is grossly normal. Tricuspid valve regurgitation is trivial. Aortic Valve: The aortic valve is tricuspid. Aortic valve regurgitation is not visualized. Pulmonic Valve: The pulmonic valve was not well visualized. Pulmonic valve regurgitation is not visualized. Aorta: The aortic root and ascending aorta are structurally normal, with no evidence of dilitation. Venous: The inferior vena cava was not well visualized. IAS/Shunts: The interatrial septum was not well visualized.  LEFT VENTRICLE PLAX 2D LVIDd:         4.37 cm  Diastology LVIDs:         2.70 cm  LV e' lateral:   8.70 cm/s LV PW:         1.01 cm  LV E/e' lateral: 10.5 LV IVS:        1.01 cm  LV e' medial:    7.83 cm/s LVOT diam:     1.80 cm  LV E/e' medial:  11.7 LV SV:         51 LV SV Index:   27 LVOT Area:     2.54 cm  RIGHT VENTRICLE RV Basal diam:  3.23 cm RV S prime:     7.51 cm/s LEFT ATRIUM             Index       RIGHT ATRIUM           Index LA diam:        2.90 cm 1.53 cm/m  RA Area:     16.40 cm LA Vol (A2C):   36.9 ml 19.53 ml/m RA Volume:   47.00 ml  24.87 ml/m LA Vol (A4C):   66.5 ml 35.19 ml/m LA Biplane Vol: 51.1 ml 27.04 ml/m  AORTIC VALVE LVOT Vmax:   80.30 cm/s LVOT Vmean:  48.600 cm/s LVOT VTI:    0.199 m  AORTA Ao Root diam: 2.70 cm MITRAL VALVE                TRICUSPID VALVE MV Area (PHT): 3.12 cm      TR Peak grad:   18.5 mmHg MV Decel Time: 243 msec     TR Vmax:        215.00 cm/s MV E velocity: 91.30 cm/s MV A velocity: 105.00 cm/s  SHUNTS MV E/A ratio:  0.87         Systemic VTI:  0.20 m                             Systemic Diam: 1.80 cm Lyman Bishop MD Electronically signed by Lyman Bishop MD Signature Date/Time: 03/26/2020/2:28:25 PM    Final      Subjective: Feeling ok  Discharge Exam: Vitals:   03/26/20 1946 03/27/20 0734  BP: (!) 149/65 (!) 156/59  Pulse: 65 62  Resp: 18 18  Temp: 98.1 F (36.7 C) 97.6 F (36.4 C)  SpO2: 95% 95%     General: Pt is alert, awake, not in acute distress Cardiovascular: RRR, S1/S2 +, no rubs, no gallops Respiratory: CTA bilaterally, no wheezing, no rhonchi Abdominal: Soft, NT, ND, bowel sounds + Extremities: no edema, no cyanosis    The results of significant diagnostics from this hospitalization (including imaging, microbiology, ancillary and laboratory) are listed below  for reference.     Microbiology: Recent Results (from the past 240 hour(s))  Respiratory Panel by RT PCR (Flu A&B, Covid) - Nasopharyngeal Swab     Status: None   Collection Time: 03/26/20  8:26 AM   Specimen: Nasopharyngeal Swab  Result Value Ref Range Status   SARS Coronavirus 2 by RT PCR NEGATIVE NEGATIVE Final    Comment: (NOTE) SARS-CoV-2 target nucleic acids are NOT DETECTED. The SARS-CoV-2 RNA is generally detectable in upper respiratoy specimens during the acute phase of infection. The lowest concentration of SARS-CoV-2 viral copies this assay can detect is 131 copies/mL. A negative result does not preclude SARS-Cov-2 infection and should not be used as the sole basis for treatment or other patient management decisions. A negative result may occur with  improper specimen collection/handling, submission of specimen other than nasopharyngeal swab, presence of viral mutation(s) within the areas targeted by this assay, and inadequate number of viral  copies (<131 copies/mL). A negative result must be combined with clinical observations, patient history, and epidemiological information. The expected result is Negative. Fact Sheet for Patients:  PinkCheek.be Fact Sheet for Healthcare Providers:  GravelBags.it This test is not yet ap proved or cleared by the [place] FDA and  has been authorized for detection and/or diagnosis of SARS-CoV-2 by FDA under an Emergency Use Authorization (EUA). This EUA will remain  in effect (meaning this test can be used) for the duration of the COVID-19 declaration under Section 564(b)(1) of the Act, 21 U.S.C. section 360bbb-3(b)(1), unless the authorization is terminated or revoked sooner.    Influenza A by PCR NEGATIVE NEGATIVE Final   Influenza B by PCR NEGATIVE NEGATIVE Final    Comment: (NOTE) The Xpert Xpress SARS-CoV-2/FLU/RSV assay is intended as an aid in  the diagnosis of influenza from Nasopharyngeal swab specimens and  should not be used as a sole basis for treatment. Nasal washings and  aspirates are unacceptable for Xpert Xpress SARS-CoV-2/FLU/RSV  testing. Fact Sheet for Patients: PinkCheek.be Fact Sheet for Healthcare Providers: GravelBags.it This test is not yet approved or cleared by the [place] FDA and  has been authorized for detection and/or diagnosis of SARS-CoV-2 by  FDA under an Emergency Use Authorization (EUA). This EUA will remain  in effect (meaning this test can be used) for the duration of the  Covid-19 declaration under Section 564(b)(1) of the Act, 21  U.S.C. section 360bbb-3(b)(1), unless the authorization is  terminated or revoked. Performed at [facility], [place] [address] [place], [place] [address]      Labs: BNP (last 3 results) No results for input(s): BNP in the last 8760 hours. Basic Metabolic Panel: Recent Labs  Lab  03/26/20 0439 03/26/20 0553 03/26/20 0847 03/27/20 0801  NA 139  --  137  --   K 3.8  --  4.2  --   CL 105  --  103  --   CO2 23  --  23  --   GLUCOSE 186*  --  195*  --   BUN 14  --  10  --   CREATININE 1.04* 1.02* 0.98  --   CALCIUM 9.0  --  8.8*  --   MG  --  1.6*  --  1.8   Liver Function Tests: Recent Labs  Lab 03/26/20 0439  AST 23  ALT 41  ALKPHOS 79  BILITOT 0.6  PROT 6.5  ALBUMIN 3.8   No results for input(s): LIPASE, AMYLASE in the last 168 hours.  No results for input(s): AMMONIA in the last 168 hours. CBC: Recent Labs  Lab 03/26/20 0439 03/26/20 0553  WBC 7.4 6.8  NEUTROABS 5.2  --   HGB 13.4 13.8  HCT 40.3 41.5  MCV 85.0 85.9  PLT 173 176   Cardiac Enzymes: No results for input(s): CKTOTAL, CKMB, CKMBINDEX, TROPONINI in the last 168 hours. BNP: Invalid input(s): POCBNP CBG: Recent Labs  Lab 03/26/20 0909 03/26/20 1218 03/26/20 1714 03/26/20 2126 03/27/20 0733  GLUCAP 196* 215* 237* 335* 227*   D-Dimer Recent Labs    03/26/20 0847  DDIMER 0.34   Hgb A1c No results for input(s): HGBA1C in the last 72 hours. Lipid Profile No results for input(s): CHOL, HDL, LDLCALC, TRIG, CHOLHDL, LDLDIRECT in the last 72 hours. Thyroid function studies Recent Labs    03/26/20 0553  TSH 2.479   Anemia work up No results for input(s): VITAMINB12, FOLATE, FERRITIN, TIBC, IRON, RETICCTPCT in the last 72 hours. Urinalysis    Component Value Date/Time   BILIRUBINUR negative 12/22/2019 1602   KETONESUR trace (5) (A) 12/22/2019 1602   UROBILINOGEN 0.2 12/22/2019 1602   NITRITE Negative 12/22/2019 1602   LEUKOCYTESUR Negative 12/22/2019 1602   Sepsis Labs Invalid input(s): PROCALCITONIN,  WBC,  LACTICIDVEN Microbiology Recent Results (from the past 240 hour(s))  Respiratory Panel by RT PCR (Flu A&B, Covid) - Nasopharyngeal Swab     Status: None   Collection Time: 03/26/20  8:26 AM   Specimen: Nasopharyngeal Swab  Result Value Ref Range Status    SARS Coronavirus 2 by RT PCR NEGATIVE NEGATIVE Final    Comment: (NOTE) SARS-CoV-2 target nucleic acids are NOT DETECTED. The SARS-CoV-2 RNA is generally detectable in upper respiratoy specimens during the acute phase of infection. The lowest concentration of SARS-CoV-2 viral copies this assay can detect is 131 copies/mL. A negative result does not preclude SARS-Cov-2 infection and should not be used as the sole basis for treatment or other patient management decisions. A negative result may occur with  improper specimen collection/handling, submission of specimen other than nasopharyngeal swab, presence of viral mutation(s) within the areas targeted by this assay, and inadequate number of viral copies (<131 copies/mL). A negative result must be combined with clinical observations, patient history, and epidemiological information. The expected result is Negative. Fact Sheet for Patients:  PinkCheek.be Fact Sheet for Healthcare Providers:  GravelBags.it This test is not yet ap proved or cleared by the [place] FDA and  has been authorized for detection and/or diagnosis of SARS-CoV-2 by FDA under an Emergency Use Authorization (EUA). This EUA will remain  in effect (meaning this test can be used) for the duration of the COVID-19 declaration under Section 564(b)(1) of the Act, 21 U.S.C. section 360bbb-3(b)(1), unless the authorization is terminated or revoked sooner.    Influenza A by PCR NEGATIVE NEGATIVE Final   Influenza B by PCR NEGATIVE NEGATIVE Final    Comment: (NOTE) The Xpert Xpress SARS-CoV-2/FLU/RSV assay is intended as an aid in  the diagnosis of influenza from Nasopharyngeal swab specimens and  should not be used as a sole basis for treatment. Nasal washings and  aspirates are unacceptable for Xpert Xpress SARS-CoV-2/FLU/RSV  testing. Fact Sheet for Patients: PinkCheek.be Fact  Sheet for Healthcare Providers: GravelBags.it This test is not yet approved or cleared by the [place] FDA and  has been authorized for detection and/or diagnosis of SARS-CoV-2 by  FDA under an Emergency Use Authorization (EUA). This EUA will remain  in effect (meaning this  test can be used) for the duration of the  Covid-19 declaration under Section 564(b)(1) of the Act, 21  U.S.C. section 360bbb-3(b)(1), unless the authorization is  terminated or revoked. Performed at [facility], [place] [address], [place], [place] [address]      Time coordinating discharge: 40 minutes  SIGNED:   Elmarie Shiley, MD  [facility]

## 2020-03-27 NOTE — Progress Notes (Signed)
Progress Note  Patient Name: Cassandra Reid Date of Encounter: 03/27/2020  Primary Cardiologist: No primary care provider on file.   Subjective   No chest pain or sob.   Inpatient Medications    Scheduled Meds:  amLODipine  10 mg Oral Daily   atorvastatin  40 mg Oral QHS   enoxaparin (LOVENOX) injection  40 mg Subcutaneous Q24H   gabapentin  300 mg Oral TID   insulin aspart  0-9 Units Subcutaneous TID WC   insulin aspart protamine- aspart  50 Units Subcutaneous BID WC   lisinopril  5 mg Oral q morning - 10a   magnesium oxide  400 mg Oral BID   metoprolol tartrate  25 mg Oral BID   Continuous Infusions:  PRN Meds: LORazepam, nitroGLYCERIN, ondansetron **OR** ondansetron (ZOFRAN) IV   Vital Signs    Vitals:   03/26/20 0911 03/26/20 1718 03/26/20 1946 03/27/20 0734  BP: (!) 172/67 (!) 157/71 (!) 149/65 (!) 156/59  Pulse: 72 61 65 62  Resp: 16 19 18 18   Temp: 97.8 F (36.6 C) 98 F (36.7 C) 98.1 F (36.7 C) 97.6 F (36.4 C)  TempSrc: Oral     SpO2: 96% 97% 95% 95%  Weight:      Height:       No intake or output data in the 24 hours ending 03/27/20 1020 Filed Weights   03/26/20 0315  Weight: 88.3 kg    Telemetry    nsr - Personally Reviewed  ECG    none - Personally Reviewed  Physical Exam   GEN: No acute distress.   Neck: No JVD Cardiac: RRR, no murmurs, rubs, or gallops.  Respiratory: Clear to auscultation bilaterally. GI: Soft, nontender, non-distended  MS: No edema; No deformity. Neuro:  Nonfocal  Psych: Normal affect   Labs    Chemistry Recent Labs  Lab 03/26/20 0439 03/26/20 0553 03/26/20 0847  NA 139  --  137  K 3.8  --  4.2  CL 105  --  103  CO2 23  --  23  GLUCOSE 186*  --  195*  BUN 14  --  10  CREATININE 1.04* 1.02* 0.98  CALCIUM 9.0  --  8.8*  PROT 6.5  --   --   ALBUMIN 3.8  --   --   AST 23  --   --   ALT 41  --   --   ALKPHOS 79  --   --   BILITOT 0.6  --   --   GFRNONAA 58* 59* >60  GFRAA >60 >60 >60   ANIONGAP 11  --  11     Hematology Recent Labs  Lab 03/26/20 0439 03/26/20 0553  WBC 7.4 6.8  RBC 4.74 4.83  HGB 13.4 13.8  HCT 40.3 41.5  MCV 85.0 85.9  MCH 28.3 28.6  MCHC 33.3 33.3  RDW 13.2 13.2  PLT 173 176    Cardiac EnzymesNo results for input(s): TROPONINI in the last 168 hours. No results for input(s): TROPIPOC in the last 168 hours.   BNPNo results for input(s): BNP, PROBNP in the last 168 hours.   DDimer  Recent Labs  Lab 03/26/20 0847  DDIMER 0.34     Radiology    CT CORONARY MORPH W/CTA COR W/SCORE W/CA W/CM &/OR WO/CM  Result Date: 03/26/2020 HISTORY: 64 yo female with chest pain/anginal equiv, 63yr CHD risk 10-20% EXAM: Cardiac/Coronary CTA TECHNIQUE: The patient was scanned on a [employer]. PROTOCOL: A  120 kV prospective scan was triggered in the descending thoracic aorta at 111 HU's. Axial non-contrast 3 mm slices were carried out through the heart. The data set was analyzed on a dedicated work station and scored using the Agatson method. Gantry rotation speed was 250 msecs and collimation was .6 mm. Beta blockade and 0.8 mg of sl NTG was given. The 3D data set was reconstructed in 5% intervals of the 67-82 % of the R-R cycle. Diastolic phases were analyzed on a dedicated work station using MPR, MIP and VRT modes. The patient received 74mL OMNIPAQUE IOHEXOL 350 MG/ML SOLN of contrast. FINDINGS: Quality: Good (HR 72) Coronary calcium score: The patient's coronary artery calcium score is 0, which places the patient in the 0 percentile. Coronary arteries: Normal coronary origins.  Right dominance. Right Coronary Artery: Dominant vessel.  No significant stenosis. Left Main Coronary Artery: Normal. Bifurcates into the LAD and LCx arteries. Left Anterior Descending Coronary Artery: Tortuous vessel that does not reach the apex. No stenosis. 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 Ramus Intermedius: Moderate-sized branch  without stenosis. Left Circumflex Artery: Large caliber AV groove vessel without stenosis. Aorta: Normal size, 28 mm at the mid ascending aorta (level of the PA bifurcation) measured double oblique. No calcifications. No dissection. Aortic Valve: Trileaflet.  No calcifications. Other findings: Normal pulmonary vein drainage into the left atrium. Normal left atrial appendage without a thrombus. Normal size of the pulmonary artery. IMPRESSION: 1. No evidence of CAD, CADRADS = 0. 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 2. Coronary calcium score of 0. 3. Normal coronary origin with right dominance. Electronically Signed   By: Chrystie Nose M.D.   On: 03/26/2020 17:28   DG CHEST PORT 1 VIEW  Result Date: 03/26/2020 CLINICAL DATA:  Chest pain EXAM: PORTABLE CHEST 1 VIEW COMPARISON:  Apr 09, 2019 FINDINGS: The lungs are clear. Heart is borderline enlarged with pulmonary vascularity normal. No adenopathy. No pneumothorax. No bone lesions. IMPRESSION: Lungs clear.  Heart borderline enlarged. Electronically Signed   By: Bretta Bang III M.D.   On: 03/26/2020 07:52   ECHOCARDIOGRAM COMPLETE  Result Date: 03/26/2020    ECHOCARDIOGRAM REPORT   Patient Name:   Cassandra Reid Date of Exam: 03/26/2020 Medical Rec #:  [id number]      Height:       62.0 in Accession #:    [id number]     Weight:       194.7 lb Date of Birth:  March 18, 1956     BSA:          1.890 m Patient Age:    63 years       BP:           172/67 mmHg Patient Gender: F              HR:           72 bpm. Exam Location:  Inpatient Procedure: 2D Echo, Cardiac Doppler and Color Doppler Indications:    Chest pain  History:        Patient has no prior history of Echocardiogram examinations.                 Morbid obesity, Signs/Symptoms:Chest Pain; Risk                 Factors:Diabetes, Hypertension and Dyslipidemia.  Sonographer:    Lavenia Atlas Referring Phys: [phone] RHONDA G BARRETT  Sonographer Comments: Patient is  morbidly obese. Image acquisition challenging due to patient body habitus. IMPRESSIONS  1. Left ventricular ejection fraction, by estimation, is 65 to 70%. The left ventricle has normal function. The left ventricle has no regional wall motion abnormalities. There is mild left ventricular hypertrophy. Left ventricular diastolic parameters are consistent with Grade I diastolic dysfunction (impaired relaxation).  2. Right ventricular systolic function is low normal. The right ventricular size is normal.  3. Left atrial size was mildly dilated.  4. The mitral valve is grossly normal. Trivial mitral valve regurgitation.  5. The aortic valve is tricuspid. Aortic valve regurgitation is not visualized. FINDINGS  Left Ventricle: Left ventricular ejection fraction, by estimation, is 65 to 70%. The left ventricle has normal function. The left ventricle has no regional wall motion abnormalities. The left ventricular internal cavity size was normal in size. There is  mild left ventricular hypertrophy. Left ventricular diastolic parameters are consistent with Grade I diastolic dysfunction (impaired relaxation). Indeterminate filling pressures. Right Ventricle: The right ventricular size is normal. No increase in right ventricular wall thickness. Right ventricular systolic function is low normal. Left Atrium: Left atrial size was mildly dilated. Right Atrium: Right atrial size was normal in size. Pericardium: There is no evidence of pericardial effusion. Mitral Valve: The mitral valve is grossly normal. Trivial mitral valve regurgitation. Tricuspid Valve: The tricuspid valve is grossly normal. Tricuspid valve regurgitation is trivial. Aortic Valve: The aortic valve is tricuspid. Aortic valve regurgitation is not visualized. Pulmonic Valve: The pulmonic valve was not well visualized. Pulmonic valve regurgitation is not visualized. Aorta: The aortic root and ascending aorta are structurally normal, with no evidence of dilitation.  Venous: The inferior vena cava was not well visualized. IAS/Shunts: The interatrial septum was not well visualized.  LEFT VENTRICLE PLAX 2D LVIDd:         4.37 cm  Diastology LVIDs:         2.70 cm  LV e' lateral:   8.70 cm/s LV PW:         1.01 cm  LV E/e' lateral: 10.5 LV IVS:        1.01 cm  LV e' medial:    7.83 cm/s LVOT diam:     1.80 cm  LV E/e' medial:  11.7 LV SV:         51 LV SV Index:   27 LVOT Area:     2.54 cm  RIGHT VENTRICLE RV Basal diam:  3.23 cm RV S prime:     7.51 cm/s LEFT ATRIUM             Index       RIGHT ATRIUM           Index LA diam:        2.90 cm 1.53 cm/m  RA Area:     16.40 cm LA Vol (A2C):   36.9 ml 19.53 ml/m RA Volume:   47.00 ml  24.87 ml/m LA Vol (A4C):   66.5 ml 35.19 ml/m LA Biplane Vol: 51.1 ml 27.04 ml/m  AORTIC VALVE LVOT Vmax:   80.30 cm/s LVOT Vmean:  48.600 cm/s LVOT VTI:    0.199 m  AORTA Ao Root diam: 2.70 cm MITRAL VALVE                TRICUSPID VALVE MV Area (PHT): 3.12 cm     TR Peak grad:   18.5 mmHg MV Decel Time: 243 msec     TR Vmax:  215.00 cm/s MV E velocity: 91.30 cm/s MV A velocity: 105.00 cm/s  SHUNTS MV E/A ratio:  0.87         Systemic VTI:  0.20 m                             Systemic Diam: 1.80 cm Zoila Shutter MD Electronically signed by Zoila Shutter MD Signature Date/Time: 03/26/2020/2:28:25 PM    Final     Cardiac Studies   Coronary CT - demonstrates no CAD  Patient Profile     64 y.o. female admitted with palpitations and chest pain and multiple cardiac risk factors.   Assessment & Plan    1. Chest pain - her coronary CT scan is negative. Ok for DC. No additional testing. 2. HTN - her bp has been up. She is on good medical regiment. Continue. Weight loss and exercise are recommended 3. Obesity - we discussed the importance of daily physical activity. 4. Palpitations - we will arrange a 14 day zio monitor. [facility] will sign off.   Medication Recommendations:  Continue current meds Other recommendations (labs,  testing, etc):  zio patch as above Follow up as an outpatient:  Dependent on the zio monitor.  For questions or updates, please contact [facility] Please consult [email or web address] for contact info under Cardiology/STEMI.      Signed, Lewayne Bunting, MD  03/27/2020, 10:20 AM  Patient ID: Cassandra Reid, female   DOB: 1956/03/07, 64 y.o.   MRN: [id number]

## 2020-03-28 ENCOUNTER — Encounter: Payer: Self-pay | Admitting: Osteopathic Medicine

## 2020-03-28 NOTE — Telephone Encounter (Signed)
Med changes as describes seem fine to me  Please schedule for HFU to go over everything in detail and check how she's doing

## 2020-03-31 ENCOUNTER — Ambulatory Visit (INDEPENDENT_AMBULATORY_CARE_PROVIDER_SITE_OTHER): Payer: Medicare PPO

## 2020-03-31 DIAGNOSIS — R002 Palpitations: Secondary | ICD-10-CM

## 2020-04-01 DIAGNOSIS — R002 Palpitations: Secondary | ICD-10-CM | POA: Diagnosis not present

## 2020-04-07 ENCOUNTER — Encounter: Payer: Self-pay | Admitting: Internal Medicine

## 2020-04-07 ENCOUNTER — Encounter: Payer: Self-pay | Admitting: Osteopathic Medicine

## 2020-04-07 ENCOUNTER — Telehealth: Payer: Self-pay | Admitting: Osteopathic Medicine

## 2020-04-07 ENCOUNTER — Other Ambulatory Visit: Payer: Self-pay

## 2020-04-07 ENCOUNTER — Ambulatory Visit (INDEPENDENT_AMBULATORY_CARE_PROVIDER_SITE_OTHER): Payer: Medicare PPO | Admitting: Osteopathic Medicine

## 2020-04-07 VITALS — BP 128/74 | HR 60 | Temp 98.1°F | Wt 194.0 lb

## 2020-04-07 DIAGNOSIS — I259 Chronic ischemic heart disease, unspecified: Secondary | ICD-10-CM

## 2020-04-07 DIAGNOSIS — Z794 Long term (current) use of insulin: Secondary | ICD-10-CM

## 2020-04-07 DIAGNOSIS — E1165 Type 2 diabetes mellitus with hyperglycemia: Secondary | ICD-10-CM

## 2020-04-07 DIAGNOSIS — I10 Essential (primary) hypertension: Secondary | ICD-10-CM | POA: Diagnosis not present

## 2020-04-07 DIAGNOSIS — M791 Myalgia, unspecified site: Secondary | ICD-10-CM | POA: Diagnosis not present

## 2020-04-07 DIAGNOSIS — G894 Chronic pain syndrome: Secondary | ICD-10-CM

## 2020-04-07 NOTE — Progress Notes (Signed)
Cassandra Reid is a 64 y.o. female who presents to  [place] at [facility]  today, 04/07/20, seeking care for the following: . HFU  Hospitalized 03/26/20, LOS 1 day for chest pain w/ palpitations, early DKA  CV: Coronary CT negative Echo negative Outpatient Zio monitor   DM: Early DKA resolved w/ 70/30 insulin Sees endocrinology, last visit 03/21/20 Pt states Rx assistance forms filled out but not haeard back yet    Hospital stopped Tizanidine, Naproxen; started magnesium  Reviewed labs 03/26/20: Glc 195, Calcium very slightly low at 8.8, renal fxn WNL, Mg low rnage of normal but WNL at 1.8        ASSESSMENT & PLAN with other pertinent history/findings:  The primary encounter diagnosis was Type 2 diabetes mellitus with hyperglycemia, with long-term current use of insulin ([place]). Diagnoses of Chronic pain syndrome, Myalgia, Chest pain due to myocardial ischemia, unspecified ischemic chest pain type, and Essential hypertension were also pertinent to this visit.  1. Type 2 diabetes mellitus with hyperglycemia, with long-term current use of insulin ([place]) Continue follow-up with endocrinology   2. Chronic pain syndrome 3. Myalgia I think OK to continue Tizanidine and Naproxen as needed  4. Chest pain due to myocardial ischemia, unspecified ischemic chest pain type Continue follow-up with cardiology Results pending: Zio patch   5. Essential hypertension BP Readings from Last 3 Encounters:  04/07/20 128/74  03/27/20 (!) 156/59  03/21/20 132/64     Patient Instructions  OK to restart the Naproxen and Tizanidine Will look into the Rx assistance issue Continue follow-up as directed w/ Cardiology, Endocrinology      Orders Placed This Encounter  Procedures  . COMPLETE METABOLIC PANEL WITH GFR  . Magnesium    No orders of the defined types were placed in this encounter.      Follow-up instructions: Return in  about 6 months (around 10/08/2020) for [employer].                                         BP 128/74 (BP Location: Left Arm, Patient Position: Sitting, Cuff Size: Large)   Pulse 60   Temp 98.1 F (36.7 C) (Oral)   Wt 194 lb (88 kg)   BMI 35.48 kg/m   Current Meds  Medication Sig  . amLODipine (NORVASC) 10 MG tablet Take 1 tablet (10 mg total) by mouth daily.  . Ascorbic Acid (VITAMIN C PO) Take 1 tablet by mouth daily.  Marland Kitchen atorvastatin (LIPITOR) 40 MG tablet TAKE 1 TABLET AT BEDTIME (Patient taking differently: Take 40 mg by mouth at bedtime. )  . Blood Glucose Monitoring Suppl (ACCU-CHEK AVIVA PLUS) w/Device KIT 1 each by Does not apply route as directed. E11.22  . fluticasone (FLONASE) 50 MCG/ACT nasal spray Place 1 spray into both nostrils daily as needed for allergies or rhinitis.  . furosemide (LASIX) 40 MG tablet Take 1 tablet (40 mg total) by mouth daily.  Marland Kitchen gabapentin (NEURONTIN) 300 MG capsule Take 1-2 capsules (300-600 mg total) by mouth 3 (three) times daily.  Marland Kitchen glucose blood (ACCU-CHEK GUIDE) test strip Use as instructed to check blood sugar 4 times daily. Dx X52.84  . insulin NPH-regular Human (NOVOLIN 70/30 RELION) (70-30) 100 UNIT/ML injection Inject 50 Units into the skin 2 (two) times daily with a meal.  . Insulin Pen Needle 32G X 4 MM MISC  For subcutaneous use with insulin  . lactulose (CHRONULAC) 10 GM/15ML solution TAKE 15-30 MLS (10-20 G TOTAL) BY MOUTH 2 (TWO) TIMES DAILY AS NEEDED FOR MILD CONSTIPATION OR MODERATE CONSTIPATION.  Marland Kitchen Lancets (ACCU-CHEK SOFT TOUCH) lancets Use as instructed to check blood sugar 4 times daily. Dx B74.93  . Lancets Misc. (ACCU-CHEK SOFTCLIX LANCET DEV) KIT Use as instructed to check blood sugar 4 times daily. Dx E11.42  . lisinopril (ZESTRIL) 5 MG tablet TAKE 1 TABLET EVERY MORNING (Patient taking differently: Take 5 mg by mouth daily. )  . LORazepam (ATIVAN) 0.5 MG tablet Take 1 tablet (0.5 mg  total) by mouth daily as needed for anxiety.  . magnesium oxide (MAG-OX) 400 (241.3 Mg) MG tablet Take 1 tablet (400 mg total) by mouth 2 (two) times daily.  . metFORMIN (GLUCOPHAGE-XR) 750 MG 24 hr tablet Take 1 tablet (750 mg total) by mouth 2 (two) times daily with a meal.  . metoprolol tartrate (LOPRESSOR) 25 MG tablet Take 1 tablet (25 mg total) by mouth 2 (two) times daily.  . Multiple Vitamin (MULTIVITAMIN PO) Take 1 tablet by mouth daily.  Marland Kitchen omeprazole (PRILOSEC) 20 MG capsule Take 1 capsule (20 mg total) by mouth daily. (Patient taking differently: Take 20 mg by mouth daily as needed (heartburn). )  . potassium chloride SA (KLOR-CON) 20 MEQ tablet TAKE 1 TABLET TWICE DAILY (Patient taking differently: Take 20 mEq by mouth 2 (two) times daily. )  . sertraline (ZOLOFT) 50 MG tablet Take 1 tablet (50 mg total) by mouth daily.    No results found for this or any previous visit (from the past 72 hour(s)).  No results found.  Depression screen [facility] 2/9 02/04/2020 11/18/2019 10/06/2019  Decreased Interest '3 2 3  '$ Down, Depressed, Hopeless '3 2 2  '$ PHQ - 2 Score '6 4 5  '$ Altered sleeping 0 2 3  Tired, decreased energy '3 2 3  '$ Change in appetite 0 0 0  Feeling bad or failure about yourself  '3 2 3  '$ Trouble concentrating 0 0 0  Moving slowly or fidgety/restless 1 0 0  Suicidal thoughts 0 0 0  PHQ-9 Score '13 10 14  '$ Difficult doing work/chores Somewhat difficult Not difficult at all Not difficult at all    GAD 7 : Generalized Anxiety Score 02/04/2020 11/18/2019 10/06/2019 04/09/2019  Nervous, Anxious, on Edge '3 3 3 3  '$ Control/stop worrying '3 3 3 3  '$ Worry too much - different things '3 3 3 3  '$ Trouble relaxing '3 2 1 3  '$ Restless 0 0 0 0  Easily annoyed or irritable 1 1 0 1  Afraid - awful might happen '3 3 2 3  '$ Total GAD 7 Score '16 15 12 16  '$ Anxiety Difficulty Somewhat difficult Not difficult at all Not difficult at all Very difficult      All questions at time of visit were answered -  patient instructed to contact office with any additional concerns or updates.  ER/RTC precautions were reviewed with the patient.  Please note: voice recognition software was used to produce this document, and typos may escape review. Please contact Dr. Sheppard Coil for any needed clarifications.

## 2020-04-07 NOTE — Patient Instructions (Signed)
OK to restart the Naproxen and Tizanidine Will look into the Rx assistance issue Continue follow-up as directed w/ Cardiology, Endocrinology

## 2020-04-07 NOTE — Telephone Encounter (Signed)
Pt reports [employer] pt assistance forms completed but never hear back - can we look into this? Phone: [phone]

## 2020-04-08 NOTE — Telephone Encounter (Signed)
There is a hold on the application citing something is wrong with the Rx.  No medication was selected.   They cannot take a phone order. The documentation has to be resubmitted.   Fax #[phone] provided.

## 2020-04-11 NOTE — Telephone Encounter (Signed)
OK I'm sorry to do this but can they be specific about what's wrong with the Rx? I attached a paper copy, there should really be no confusion. We are having a lot of problems with [employer] patient assistance lately so I want to make sure this is cleared up for the patient.

## 2020-04-12 NOTE — Telephone Encounter (Signed)
Per provider's request - contacted [employer] at [phone]. Spoke to [place], she confirmed that provider's information is completed. Pt information is missing on the form. Pt was faxed over the forms to complete and fax back to [employer]. Which pt has not done yet. Luster Landsberg states that it takes 48 hrs for a determination depending when pt actually returns the form. No other inquiries during the call.

## 2020-04-12 NOTE — Telephone Encounter (Signed)
Well then patient needs to fill out her end of things!  If she needs new forms, we can mail these

## 2020-04-20 ENCOUNTER — Other Ambulatory Visit: Payer: Self-pay | Admitting: Osteopathic Medicine

## 2020-04-20 DIAGNOSIS — E1169 Type 2 diabetes mellitus with other specified complication: Secondary | ICD-10-CM

## 2020-04-20 DIAGNOSIS — I152 Hypertension secondary to endocrine disorders: Secondary | ICD-10-CM

## 2020-04-20 DIAGNOSIS — E1122 Type 2 diabetes mellitus with diabetic chronic kidney disease: Secondary | ICD-10-CM

## 2020-04-21 ENCOUNTER — Other Ambulatory Visit: Payer: Self-pay

## 2020-04-21 DIAGNOSIS — F419 Anxiety disorder, unspecified: Secondary | ICD-10-CM

## 2020-04-21 DIAGNOSIS — F32A Depression, unspecified: Secondary | ICD-10-CM

## 2020-04-22 ENCOUNTER — Other Ambulatory Visit: Payer: Self-pay

## 2020-04-22 DIAGNOSIS — F32A Depression, unspecified: Secondary | ICD-10-CM

## 2020-04-22 MED ORDER — LORAZEPAM 0.5 MG PO TABS: 0.5000 mg | ORAL_TABLET | Freq: Every day | ORAL | 0 refills | Status: DC | PRN

## 2020-04-22 NOTE — Telephone Encounter (Addendum)
[facility] pharmacy requesting med refills for lorazepam. Last written on 01/29/20. Rx pended.

## 2020-04-27 DIAGNOSIS — M791 Myalgia, unspecified site: Secondary | ICD-10-CM | POA: Diagnosis not present

## 2020-04-27 DIAGNOSIS — G894 Chronic pain syndrome: Secondary | ICD-10-CM | POA: Diagnosis not present

## 2020-04-27 DIAGNOSIS — I10 Essential (primary) hypertension: Secondary | ICD-10-CM | POA: Diagnosis not present

## 2020-04-27 DIAGNOSIS — I259 Chronic ischemic heart disease, unspecified: Secondary | ICD-10-CM | POA: Diagnosis not present

## 2020-04-27 DIAGNOSIS — E1165 Type 2 diabetes mellitus with hyperglycemia: Secondary | ICD-10-CM | POA: Diagnosis not present

## 2020-04-27 DIAGNOSIS — Z794 Long term (current) use of insulin: Secondary | ICD-10-CM | POA: Diagnosis not present

## 2020-04-28 ENCOUNTER — Encounter: Payer: Self-pay | Admitting: Osteopathic Medicine

## 2020-04-28 LAB — COMPLETE METABOLIC PANEL WITH GFR
AG Ratio: 1.9 (calc) (ref 1.0–2.5)
ALT: 45 U/L — ABNORMAL HIGH (ref 6–29)
AST: 32 U/L (ref 10–35)
Albumin: 4.4 g/dL (ref 3.6–5.1)
Alkaline phosphatase (APISO): 85 U/L (ref 37–153)
BUN/Creatinine Ratio: 18 (calc) (ref 6–22)
BUN: 19 mg/dL (ref 7–25)
CO2: 22 mmol/L (ref 20–32)
Calcium: 9.9 mg/dL (ref 8.6–10.4)
Chloride: 102 mmol/L (ref 98–110)
Creat: 1.05 mg/dL — ABNORMAL HIGH (ref 0.50–0.99)
GFR, Est African American: 66 mL/min/{1.73_m2} (ref >=60)
GFR, Est Non African American: 57 mL/min/{1.73_m2} — ABNORMAL LOW (ref >=60)
Globulin: 2.3 g/dL (calc) (ref 1.9–3.7)
Glucose, Bld: 172 mg/dL — ABNORMAL HIGH (ref 65–99)
Potassium: 4.4 mmol/L (ref 3.5–5.3)
Sodium: 139 mmol/L (ref 135–146)
Total Bilirubin: 0.5 mg/dL (ref 0.2–1.2)
Total Protein: 6.7 g/dL (ref 6.1–8.1)

## 2020-04-28 LAB — MAGNESIUM: Magnesium: 2 mg/dL (ref 1.5–2.5)

## 2020-05-03 DIAGNOSIS — M545 Low back pain: Secondary | ICD-10-CM | POA: Diagnosis not present

## 2020-05-03 DIAGNOSIS — E538 Deficiency of other specified B group vitamins: Secondary | ICD-10-CM | POA: Diagnosis not present

## 2020-05-03 DIAGNOSIS — D539 Nutritional anemia, unspecified: Secondary | ICD-10-CM | POA: Diagnosis not present

## 2020-05-03 DIAGNOSIS — M546 Pain in thoracic spine: Secondary | ICD-10-CM | POA: Diagnosis not present

## 2020-05-04 ENCOUNTER — Encounter: Payer: Self-pay | Admitting: Osteopathic Medicine

## 2020-05-04 ENCOUNTER — Telehealth: Payer: Self-pay | Admitting: Cardiology

## 2020-05-04 NOTE — Telephone Encounter (Signed)
        I went in pt's chart to see who had called her this morning. It was Scherrie Bateman, calling her Monitor results. I sent Scherrie Bateman the call and she talked  To the pt

## 2020-05-06 NOTE — Telephone Encounter (Signed)
Appointment has been made

## 2020-05-09 ENCOUNTER — Encounter: Payer: Self-pay | Admitting: Osteopathic Medicine

## 2020-05-09 ENCOUNTER — Telehealth (INDEPENDENT_AMBULATORY_CARE_PROVIDER_SITE_OTHER): Payer: Medicare PPO | Admitting: Osteopathic Medicine

## 2020-05-09 VITALS — BP 151/78 | HR 83 | Wt 194.0 lb

## 2020-05-09 DIAGNOSIS — R748 Abnormal levels of other serum enzymes: Secondary | ICD-10-CM | POA: Diagnosis not present

## 2020-05-09 DIAGNOSIS — G894 Chronic pain syndrome: Secondary | ICD-10-CM

## 2020-05-09 DIAGNOSIS — R002 Palpitations: Secondary | ICD-10-CM | POA: Diagnosis not present

## 2020-05-09 NOTE — Progress Notes (Signed)
Virtual Visit via Video (App used: MyChart) Note  I connected with      [name] on 05/09/20 at 1:01 PM  by a telemedicine application and verified that I am speaking with the correct person using two identifiers.  Patient is at home I am in office   I discussed the limitations of evaluation and management by telemedicine and the availability of in person appointments. The patient expressed understanding and agreed to proceed.  History of Present Illness: Cassandra Reid is a 64 y.o. female who would like to discuss results     AST/ALT - not too worried about these numbers, mild elevated  Heart monitor - no concerns, few runs SVT, has upcoming appt w/ cardiology. I reviewed the report and advised pt of results.   Saw [facility] - was supposed to see GI but was instead scheduled w/ pain mgt, was Rx Tramadol and possibly referred to spine specialist? Will request records       Observations/Objective: BP (!) 151/78    Pulse 83    Wt 194 lb (88 kg)    BMI 35.48 kg/m  BP Readings from Last 3 Encounters:  05/09/20 (!) 151/78  04/07/20 128/74  03/27/20 (!) 156/59   Exam: Normal Speech.  NAD  Lab and Radiology Results No results found for this or any previous visit (from the past 72 hour(s)). No results found.     Assessment and Plan: 64 y.o. female with The primary encounter diagnosis was Palpitations. Diagnoses of Chronic pain syndrome and Elevated liver enzymes were also pertinent to this visit.  No concern for ALT/AST numbers Heart monitor showing intermittent SVT amx 7 beats - may explain palpitations - follow w/ cardiology upcoming appt in a couple weeks  Advised if she doesn't need the tramdol then wouldn't bother taking it - not sure why a pain mgt specialist felt the need to be writing Rx if they don't have plans to follow-up with patient - will review records once these are available.      Follow Up Instructions: Return for Coke.    I discussed the assessment and treatment plan with the patient. The patient was provided an opportunity to ask questions and all were answered. The patient agreed with the plan and demonstrated an understanding of the instructions.   The patient was advised to call back or seek an in-person evaluation if any new concerns, if symptoms worsen or if the condition fails to improve as anticipated.  30 minutes of non-face-to-face time was provided during this encounter.      . . . . . . . . . . . . . Marland Kitchen                   Historical information moved to improve visibility of documentation.  Past Medical History:  Diagnosis Date   Anxiety    Arthritis    Asthma    Cerebral aneurysm without rupture    Chronic kidney disease    CKD stage 3 due to type 2 diabetes mellitus ([place]) 04/15/2019   Depression    Diabetes mellitus without complication ([place])    Fibromyalgia    Former smoker    GERD (gastroesophageal reflux disease)    Heart murmur    History of chicken pox    History of colon polyps    Hypertension    IBS (irritable bowel syndrome)    Legally blind    Migraines  Past Surgical History:  Procedure Laterality Date   ABDOMINAL HYSTERECTOMY  2012   [place]   CEREBRAL ANEURYSM REPAIR  2004   x 2   CHOLECYSTECTOMY  2015   MENISCUS REPAIR Bilateral    Social History   Tobacco Use   Smoking status: Former Smoker    Packs/day: 1.00    Years: 3.00    Pack years: 3.00    Types: Cigarettes    Quit date: 09/04/1989    Years since quitting: 30.6   Smokeless tobacco: Never Used  Substance Use Topics   Alcohol use: Never   family history includes Alcohol abuse in her maternal grandfather and mother; Breast cancer in her sister; COPD in her father; Cancer in her maternal grandfather, maternal grandmother, paternal grandfather, and paternal grandmother; Diabetes in her father, mother,  sister, and sister; Early death in her father; Heart disease in her father; Hyperlipidemia in her father; Hypertension in her father, mother, sister, and sister; Kidney disease in her mother and sister.  Medications: Current Outpatient Medications  Medication Sig Dispense Refill   Accu-Chek FastClix Lancets MISC TEST BLOOD SUGAR FOUR TIMES DAILY AS DIRECTED 408 each 1   amLODipine (NORVASC) 10 MG tablet Take 1 tablet (10 mg total) by mouth daily. 90 tablet 3   Ascorbic Acid (VITAMIN C PO) Take 1 tablet by mouth daily.     atorvastatin (LIPITOR) 40 MG tablet TAKE 1 TABLET AT BEDTIME 90 tablet 1   Blood Glucose Monitoring Suppl (ACCU-CHEK AVIVA PLUS) w/Device KIT 1 each by Does not apply route as directed. E11.22 1 kit 0   fluticasone (FLONASE) 50 MCG/ACT nasal spray Place 1 spray into both nostrils daily as needed for allergies or rhinitis.     furosemide (LASIX) 40 MG tablet Take 1 tablet (40 mg total) by mouth daily. 90 tablet 3   gabapentin (NEURONTIN) 300 MG capsule Take 1-2 capsules (300-600 mg total) by mouth 3 (three) times daily. 540 capsule 1   glucose blood (ACCU-CHEK GUIDE) test strip Use as instructed to check blood sugar 4 times daily. Dx E11.42 200 each prn   insulin NPH-regular Human (NOVOLIN 70/30 RELION) (70-30) 100 UNIT/ML injection Inject 50 Units into the skin 2 (two) times daily with a meal. 10 mL 11   Insulin Pen Needle 32G X 4 MM MISC For subcutaneous use with insulin 90 each 0   lactulose (CHRONULAC) 10 GM/15ML solution TAKE 15-30 MLS (10-20 G TOTAL) BY MOUTH 2 (TWO) TIMES DAILY AS NEEDED FOR MILD CONSTIPATION OR MODERATE CONSTIPATION. 236 mL 1   Lancets Misc. (ACCU-CHEK SOFTCLIX LANCET DEV) KIT Use as instructed to check blood sugar 4 times daily. Dx E11.42 1 kit prn   lisinopril (ZESTRIL) 5 MG tablet TAKE 1 TABLET EVERY MORNING 90 tablet 1   LORazepam (ATIVAN) 0.5 MG tablet Take 1 tablet (0.5 mg total) by mouth daily as needed for anxiety. 30 tablet 0    magnesium oxide (MAG-OX) 400 (241.3 Mg) MG tablet Take 1 tablet (400 mg total) by mouth 2 (two) times daily. 30 tablet 0   metFORMIN (GLUCOPHAGE-XR) 750 MG 24 hr tablet Take 1 tablet (750 mg total) by mouth 2 (two) times daily with a meal. 180 tablet 3   metoprolol tartrate (LOPRESSOR) 25 MG tablet Take 1 tablet (25 mg total) by mouth 2 (two) times daily. 180 tablet 3   Multiple Vitamin (MULTIVITAMIN PO) Take 1 tablet by mouth daily.     omeprazole (PRILOSEC) 20 MG capsule Take 1 capsule (  20 mg total) by mouth daily. (Patient taking differently: Take 20 mg by mouth daily as needed (heartburn). ) 90 capsule 3   potassium chloride SA (KLOR-CON) 20 MEQ tablet TAKE 1 TABLET TWICE DAILY (Patient taking differently: Take 20 mEq by mouth 2 (two) times daily. ) 180 tablet 1   sertraline (ZOLOFT) 50 MG tablet Take 1 tablet (50 mg total) by mouth daily. 90 tablet 3   traMADol (ULTRAM) 50 MG tablet Take by mouth 2 (two) times daily.     No current facility-administered medications for this visit.   Allergies  Allergen Reactions   Cymbalta [Duloxetine Hcl] Other (See Comments)    Extreme joint pain and stiffness

## 2020-05-16 DIAGNOSIS — Z03818 Encounter for observation for suspected exposure to other biological agents ruled out: Secondary | ICD-10-CM | POA: Diagnosis not present

## 2020-05-16 DIAGNOSIS — Z20822 Contact with and (suspected) exposure to covid-19: Secondary | ICD-10-CM | POA: Diagnosis not present

## 2020-05-17 NOTE — Progress Notes (Signed)
Referring-Natalie Alexander DO Reason for referral-MVP, palpitations and CP  HPI: 64 yo female for evaluation of MVP, palpitations and CP at request of [place].  Patient recently admitted in May 2021 with chest pain.  Troponins were normal.  Echocardiogram May 2021 showed normal LV function, mild left ventricular hypertrophy, grade 1 diastolic dysfunction, mild left atrial enlargement and trace mitral regurgitation. Monitor 6/21 showed sinus with PACs, PVCs and brief atrial tachycardia.  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  Since DC, she continues to have chest pain.  She states she has had this intermittently for years.  It starts in the left axillary area, radiates under her left breast and into the substernal area.  Can last a good part of the day.  It is not exertional.  She notes some dyspnea on exertion but attributes this to deconditioning.  She continues to have occasional palpitations.  No syncope.  Current Outpatient Medications  Medication Sig Dispense Refill   Accu-Chek FastClix Lancets MISC TEST BLOOD SUGAR FOUR TIMES DAILY AS DIRECTED 408 each 1   amLODipine (NORVASC) 10 MG tablet Take 1 tablet (10 mg total) by mouth daily. 90 tablet 3   Ascorbic Acid (VITAMIN C PO) Take 1 tablet by mouth daily.     atorvastatin (LIPITOR) 40 MG tablet TAKE 1 TABLET AT BEDTIME 90 tablet 1   Blood Glucose Monitoring Suppl (ACCU-CHEK AVIVA PLUS) w/Device KIT 1 each by Does not apply route as directed. E11.22 1 kit 0   fluticasone (FLONASE) 50 MCG/ACT nasal spray Place 1 spray into both nostrils daily as needed for allergies or rhinitis.     furosemide (LASIX) 40 MG tablet Take 1 tablet (40 mg total) by mouth daily. 90 tablet 3   gabapentin (NEURONTIN) 300 MG capsule Take 1-2 capsules (300-600 mg total) by mouth 3 (three) times daily. 540 capsule 1   glucose blood (ACCU-CHEK GUIDE) test  strip Use as instructed to check blood sugar 4 times daily. Dx E11.42 200 each prn   insulin NPH-regular Human (NOVOLIN 70/30 RELION) (70-30) 100 UNIT/ML injection Inject 50 Units into the skin 2 (two) times daily with a meal. 10 mL 11   Insulin Pen Needle 32G X 4 MM MISC For subcutaneous use with insulin 90 each 0   lactulose (CHRONULAC) 10 GM/15ML solution TAKE 15-30 MLS (10-20 G TOTAL) BY MOUTH 2 (TWO) TIMES DAILY AS NEEDED FOR MILD CONSTIPATION OR MODERATE CONSTIPATION. 236 mL 1   Lancets Misc. (ACCU-CHEK SOFTCLIX LANCET DEV) KIT Use as instructed to check blood sugar 4 times daily. Dx E11.42 1 kit prn   lisinopril (ZESTRIL) 5 MG tablet TAKE 1 TABLET EVERY MORNING 90 tablet 1   LORazepam (ATIVAN) 0.5 MG tablet Take 1 tablet (0.5 mg total) by mouth daily as needed for anxiety. 30 tablet 0   magnesium oxide (MAG-OX) 400 (241.3 Mg) MG tablet Take 1 tablet (400 mg total) by mouth 2 (two) times daily. 30 tablet 0   metFORMIN (GLUCOPHAGE-XR) 750 MG 24 hr tablet Take 1 tablet (750 mg total) by mouth 2 (two) times daily with a meal. 180 tablet 3   metoprolol tartrate (LOPRESSOR) 25 MG tablet Take 1 tablet (25 mg total) by mouth 2 (two) times daily. 180 tablet 3   Multiple Vitamin (MULTIVITAMIN PO) Take 1 tablet by mouth daily.     omeprazole (PRILOSEC) 20 MG capsule Take 1 capsule (20 mg  total) by mouth daily. (Patient taking differently: Take 20 mg by mouth daily as needed (heartburn). ) 90 capsule 3   potassium chloride SA (KLOR-CON) 20 MEQ tablet TAKE 1 TABLET TWICE DAILY 180 tablet 1   sertraline (ZOLOFT) 50 MG tablet Take 1 tablet (50 mg total) by mouth daily. (Patient not taking: Reported on 05/25/2020) 90 tablet 3   traMADol (ULTRAM) 50 MG tablet Take by mouth 2 (two) times daily. (Patient not taking: Reported on 05/25/2020)     No current facility-administered medications for this visit.    Allergies  Allergen Reactions   Cymbalta [Duloxetine Hcl] Other (See Comments)    Extreme  joint pain and stiffness     Past Medical History:  Diagnosis Date   Anxiety    Arthritis    Asthma    Cerebral aneurysm without rupture    Chronic kidney disease    CKD stage 3 due to type 2 diabetes mellitus ([place]) 04/15/2019   Depression    Diabetes mellitus without complication ([place])    Fibromyalgia    Former smoker    GERD (gastroesophageal reflux disease)    Heart murmur    History of chicken pox    History of colon polyps    Hypertension    IBS (irritable bowel syndrome)    Legally blind    Migraines     Past Surgical History:  Procedure Laterality Date   ABDOMINAL HYSTERECTOMY  2012   [place]   CEREBRAL ANEURYSM REPAIR  2004   x 2   CHOLECYSTECTOMY  2015   MENISCUS REPAIR Bilateral     Social History   Socioeconomic History   Marital status: Married    Spouse name: John   Number of children: 3   Years of education: Not on file   Highest education level: Associate degree: academic program  Occupational History   Occupation: disabled  Tobacco Use   Smoking status: Former Smoker    Packs/day: 1.00    Years: 3.00    Pack years: 3.00    Types: Cigarettes    Quit date: 09/04/1989    Years since quitting: 30.7   Smokeless tobacco: Never Used  Scientific laboratory technician Use: Never used  Substance and Sexual Activity   Alcohol use: Never   Drug use: Never   Sexual activity: Not Currently  Other Topics Concern   Not on file  Social History Narrative   Patient is right-handed. She lives with her husband in a 2 story house. She walks the dog daily for exercise. She drinks I cup of coffee and 3 glasses of unsweet tea.   Social Determinants of Health   Financial Resource Strain:    Difficulty of Paying Living Expenses:   Food Insecurity:    Worried About Charity fundraiser in the Last Year:    Arboriculturist in the Last Year:   Transportation Needs:    Film/video editor (Medical):    Lack of  Transportation (Non-Medical):   Physical Activity:    Days of Exercise per Week:    Minutes of Exercise per Session:   Stress:    Feeling of Stress :   Social Connections:    Frequency of Communication with Friends and Family:    Frequency of Social Gatherings with Friends and Family:    Attends Religious Services:    Active Member of Clubs or Organizations:    Attends Archivist Meetings:    Marital Status:  Intimate Partner Violence:    Fear of Current or Ex-Partner:    Emotionally Abused:    Physically Abused:    Sexually Abused:     Family History  Problem Relation Age of Onset   Alcohol abuse Mother    Diabetes Mother    Hypertension Mother    Kidney disease Mother    COPD Father    Diabetes Father    Early death Father    Heart disease Father    Hyperlipidemia Father    Hypertension Father    Diabetes Sister    Hypertension Sister    Breast cancer Sister    Cancer Maternal Grandmother    Alcohol abuse Maternal Grandfather    Cancer Maternal Grandfather    Cancer Paternal Grandmother    Cancer Paternal Grandfather    Diabetes Sister    Hypertension Sister    Kidney disease Sister     ROS: no fevers or chills, productive cough, hemoptysis, dysphasia, odynophagia, melena, hematochezia, dysuria, hematuria, rash, seizure activity, orthopnea, PND, pedal edema, claudication. Remaining systems are negative.  Physical Exam:   Blood pressure (!) 146/80, pulse 75, height '5\' 2"'$  (1.575 m), weight 194 lb (88 kg), SpO2 96 %.  General:  Well developed/well nourished in NAD Skin warm/dry Patient not depressed No peripheral clubbing Back-normal HEENT-normal/normal eyelids Neck supple/normal carotid upstroke bilaterally; no bruits; no JVD; no thyromegaly chest - CTA/ normal expansion CV - RRR/normal S1 and S2; no murmurs, rubs or gallops;  PMI nondisplaced Abdomen -NT/ND, no HSM, no mass, + bowel sounds, no bruit 2+ femoral  pulses, no bruits Ext-no edema, chords, 2+ DP Neuro-grossly nonfocal  A/P  1 question history of mitral valve prolapse-not evident on most recent echocardiogram.  2 history of chest pain-chest pain is very atypical and not consistent with cardiac pain.  CTA shows no coronary disease.  Will not pursue further evaluation.  3 palpitations-patient continues to have palpitations.  Monitor results as noted.  Increase metoprolol to 50 mg twice daily and follow.  4 hypertension-patient's blood pressure is mildly elevated; increase metoprolol to 50 mg twice daily and follow.  Kirk Ruths, MD

## 2020-05-25 ENCOUNTER — Other Ambulatory Visit: Payer: Self-pay

## 2020-05-25 ENCOUNTER — Encounter: Payer: Self-pay | Admitting: Cardiology

## 2020-05-25 ENCOUNTER — Ambulatory Visit (INDEPENDENT_AMBULATORY_CARE_PROVIDER_SITE_OTHER): Payer: Medicare PPO | Admitting: Cardiology

## 2020-05-25 VITALS — BP 146/80 | HR 75 | Ht 62.0 in | Wt 194.0 lb

## 2020-05-25 DIAGNOSIS — R072 Precordial pain: Secondary | ICD-10-CM | POA: Diagnosis not present

## 2020-05-25 DIAGNOSIS — I10 Essential (primary) hypertension: Secondary | ICD-10-CM | POA: Diagnosis not present

## 2020-05-25 DIAGNOSIS — R002 Palpitations: Secondary | ICD-10-CM

## 2020-05-25 MED ORDER — METOPROLOL TARTRATE 50 MG PO TABS: 50.0000 mg | ORAL_TABLET | Freq: Two times a day (BID) | ORAL | 3 refills | Status: DC

## 2020-05-25 NOTE — Patient Instructions (Signed)
Medication Instructions:   INCREASE METOPROLOL TO 50 MG TWICE DAILY= 2 OF THE 25 MG TABLETS TWICE DAILY  *If you need a refill on your cardiac medications before your next appointment, please call your pharmacy*   Lab Work: If you have labs (blood work) drawn today and your tests are completely normal, you will receive your results only by: Marland Kitchen MyChart Message (if you have MyChart) OR . A paper copy in the mail If you have any lab test that is abnormal or we need to change your treatment, we will call you to review the results.  Follow-Up: At [facility], you and your health needs are our priority.  As part of our continuing mission to provide you with exceptional heart care, we have created designated Provider Care Teams.  These Care Teams include your primary Cardiologist (physician) and Advanced Practice Providers (APPs -  Physician Assistants and Nurse Practitioners) who all work together to provide you with the care you need, when you need it.  We recommend signing up for the patient portal called "MyChart".  Sign up information is provided on this After Visit Summary.  MyChart is used to connect with patients for Virtual Visits (Telemedicine).  Patients are able to view lab/test results, encounter notes, upcoming appointments, etc.  Non-urgent messages can be sent to your provider as well.   To learn more about what you can do with MyChart, go to [email or web address].    Your next appointment:   12 month(s)  The format for your next appointment:   In Person  Provider:   Olga Millers, MD

## 2020-05-26 ENCOUNTER — Encounter: Payer: Self-pay | Admitting: Gastroenterology

## 2020-05-26 ENCOUNTER — Ambulatory Visit (INDEPENDENT_AMBULATORY_CARE_PROVIDER_SITE_OTHER): Payer: Medicare PPO | Admitting: Osteopathic Medicine

## 2020-05-26 ENCOUNTER — Encounter: Payer: Self-pay | Admitting: Osteopathic Medicine

## 2020-05-26 VITALS — BP 140/54 | HR 66 | Temp 97.0°F | Wt 192.0 lb

## 2020-05-26 DIAGNOSIS — M940 Chondrocostal junction syndrome [Tietze]: Secondary | ICD-10-CM

## 2020-05-26 DIAGNOSIS — R197 Diarrhea, unspecified: Secondary | ICD-10-CM

## 2020-05-26 DIAGNOSIS — R1010 Upper abdominal pain, unspecified: Secondary | ICD-10-CM

## 2020-05-26 DIAGNOSIS — M546 Pain in thoracic spine: Secondary | ICD-10-CM | POA: Diagnosis not present

## 2020-05-26 DIAGNOSIS — M545 Low back pain: Secondary | ICD-10-CM

## 2020-05-26 DIAGNOSIS — G8929 Other chronic pain: Secondary | ICD-10-CM | POA: Diagnosis not present

## 2020-05-26 DIAGNOSIS — R1012 Left upper quadrant pain: Secondary | ICD-10-CM

## 2020-05-26 MED ORDER — NAPROXEN 500 MG PO TABS
500.0000 mg | ORAL_TABLET | Freq: Two times a day (BID) | ORAL | 1 refills | Status: DC
Start: 2020-05-26 — End: 2020-08-09

## 2020-05-26 NOTE — Progress Notes (Signed)
Cassandra Reid is a 64 y.o. female who presents to  [facility]  today, 05/26/20, seeking care for the following:   Back pain, diarrhea. Ongoing about a month, watery non-bloody stool, several times per day. Tried to get into GI but was scheduled w/ Pain Mgt instead (?[facility]?) and had Xrays of back done which showed "holes" in lower spine. Sharp intermittent lower back pain and thoracic back pain radiation to L torso and under L breast.   On exam, (+)TTP lower C-spine, mid/lower T spine and L5 area, (+)tender midline as well as paraspinal musculature. Abd (+)TTP LUQ and RUQ. (+)TTP L of sternum     ASSESSMENT & PLAN with other pertinent findings:  The primary encounter diagnosis was Chronic bilateral back pain, unspecified back location. Diagnoses of Pain of upper abdomen, Diarrhea, unspecified type, Left upper quadrant abdominal pain, and Costochondritis were also pertinent to this visit.   No results found for this or any previous visit (from the past 24 hour(s)).  ---> Stool culture, will get CT abd eval diverticular disease and/or colitis, stool studies, GI referral (diarrhea now also hx hiatal hernia and chronic GERD). Short course NSAID. Need records from [place], these were requested, may consider repeat imaging here w/ Xray at least. I think back pain MSK likely but may be some additional component of viscerosomatic reflex.    There are no Patient Instructions on file for this visit.  Orders Placed This Encounter  Procedures   Stool Culture   CT Abdomen Pelvis W Contrast   C. difficile GDH and Toxin A/B   Stool, WBC/Lactoferrin   Ambulatory referral to Gastroenterology    Meds ordered this encounter  Medications   naproxen (NAPROSYN) 500 MG tablet    Sig: Take 1 tablet (500 mg total) by mouth 2 (two) times daily with a meal. Take every day for one week, then use as needed after that    Dispense:  60 tablet     Refill:  1       Follow-up instructions: Return for RECHECK PENDING RESULTS / IF WORSE OR CHANGE.                                         BP (!) 140/54 (BP Location: Left Arm, Patient Position: Sitting)    Pulse 66    Temp (!) 97 F (36.1 C)    Wt 192 lb (87.1 kg)    SpO2 98%    BMI 35.12 kg/m   Current Meds  Medication Sig   Accu-Chek FastClix Lancets MISC TEST BLOOD SUGAR FOUR TIMES DAILY AS DIRECTED   amLODipine (NORVASC) 10 MG tablet Take 1 tablet (10 mg total) by mouth daily.   Ascorbic Acid (VITAMIN C PO) Take 1 tablet by mouth daily.   atorvastatin (LIPITOR) 40 MG tablet TAKE 1 TABLET AT BEDTIME   Blood Glucose Monitoring Suppl (ACCU-CHEK AVIVA PLUS) w/Device KIT 1 each by Does not apply route as directed. E11.22   fluticasone (FLONASE) 50 MCG/ACT nasal spray Place 1 spray into both nostrils daily as needed for allergies or rhinitis.   furosemide (LASIX) 40 MG tablet Take 1 tablet (40 mg total) by mouth daily.   glucose blood (ACCU-CHEK GUIDE) test strip Use as instructed to check blood sugar 4 times daily. Dx E11.42   insulin NPH-regular Human (NOVOLIN 70/30 RELION) (70-30) 100  UNIT/ML injection Inject 50 Units into the skin 2 (two) times daily with a meal.   Insulin Pen Needle 32G X 4 MM MISC For subcutaneous use with insulin   lactulose (CHRONULAC) 10 GM/15ML solution TAKE 15-30 MLS (10-20 G TOTAL) BY MOUTH 2 (TWO) TIMES DAILY AS NEEDED FOR MILD CONSTIPATION OR MODERATE CONSTIPATION.   Lancets Misc. (ACCU-CHEK SOFTCLIX LANCET DEV) KIT Use as instructed to check blood sugar 4 times daily. Dx E11.42   lisinopril (ZESTRIL) 5 MG tablet TAKE 1 TABLET EVERY MORNING   LORazepam (ATIVAN) 0.5 MG tablet Take 1 tablet (0.5 mg total) by mouth daily as needed for anxiety.   magnesium oxide (MAG-OX) 400 (241.3 Mg) MG tablet Take 1 tablet (400 mg total) by mouth 2 (two) times daily.   metFORMIN (GLUCOPHAGE-XR) 750 MG 24 hr tablet Take 1  tablet (750 mg total) by mouth 2 (two) times daily with a meal.   metoprolol tartrate (LOPRESSOR) 50 MG tablet Take 1 tablet (50 mg total) by mouth 2 (two) times daily.   Multiple Vitamin (MULTIVITAMIN PO) Take 1 tablet by mouth daily.   omeprazole (PRILOSEC) 20 MG capsule Take 1 capsule (20 mg total) by mouth daily. (Patient taking differently: Take 20 mg by mouth daily as needed (heartburn). )   potassium chloride SA (KLOR-CON) 20 MEQ tablet TAKE 1 TABLET TWICE DAILY   traMADol (ULTRAM) 50 MG tablet Take by mouth 2 (two) times daily.     No results found for this or any previous visit (from the past 72 hour(s)).  No results found.     All questions at time of visit were answered - patient instructed to contact office with any additional concerns or updates.  ER/RTC precautions were reviewed with the patient as applicable.   Please note: voice recognition software was used to produce this document, and typos may escape review. Please contact Dr. Sheppard Coil for any needed clarifications.

## 2020-05-27 DIAGNOSIS — R1010 Upper abdominal pain, unspecified: Secondary | ICD-10-CM | POA: Diagnosis not present

## 2020-05-27 DIAGNOSIS — R197 Diarrhea, unspecified: Secondary | ICD-10-CM | POA: Diagnosis not present

## 2020-05-27 DIAGNOSIS — G8929 Other chronic pain: Secondary | ICD-10-CM | POA: Diagnosis not present

## 2020-05-27 DIAGNOSIS — M549 Dorsalgia, unspecified: Secondary | ICD-10-CM | POA: Diagnosis not present

## 2020-05-27 DIAGNOSIS — R1012 Left upper quadrant pain: Secondary | ICD-10-CM | POA: Diagnosis not present

## 2020-05-31 ENCOUNTER — Ambulatory Visit (INDEPENDENT_AMBULATORY_CARE_PROVIDER_SITE_OTHER): Payer: Medicare PPO

## 2020-05-31 ENCOUNTER — Encounter: Payer: Self-pay | Admitting: Physician Assistant

## 2020-05-31 ENCOUNTER — Other Ambulatory Visit: Payer: Self-pay

## 2020-05-31 DIAGNOSIS — R1012 Left upper quadrant pain: Secondary | ICD-10-CM | POA: Diagnosis not present

## 2020-05-31 DIAGNOSIS — Z90710 Acquired absence of both cervix and uterus: Secondary | ICD-10-CM | POA: Diagnosis not present

## 2020-05-31 DIAGNOSIS — K760 Fatty (change of) liver, not elsewhere classified: Secondary | ICD-10-CM | POA: Diagnosis not present

## 2020-05-31 DIAGNOSIS — K429 Umbilical hernia without obstruction or gangrene: Secondary | ICD-10-CM | POA: Diagnosis not present

## 2020-05-31 DIAGNOSIS — Z9049 Acquired absence of other specified parts of digestive tract: Secondary | ICD-10-CM | POA: Diagnosis not present

## 2020-05-31 HISTORY — DX: Fatty (change of) liver, not elsewhere classified: K76.0

## 2020-05-31 LAB — FECAL LACTOFERRIN, QUANT
Fecal Lactoferrin: NEGATIVE
MICRO NUMBER:: 10688091
SPECIMEN QUALITY:: ADEQUATE

## 2020-05-31 LAB — STOOL CULTURE
MICRO NUMBER:: 10689206
MICRO NUMBER:: 10689207
MICRO NUMBER:: 10689208
SHIGA RESULT:: NOT DETECTED
SPECIMEN QUALITY:: ADEQUATE
SPECIMEN QUALITY:: ADEQUATE
SPECIMEN QUALITY:: ADEQUATE

## 2020-05-31 LAB — C. DIFFICILE GDH AND TOXIN A/B
GDH ANTIGEN: NOT DETECTED
MICRO NUMBER:: 10688484
SPECIMEN QUALITY:: ADEQUATE
TOXIN A AND B: NOT DETECTED

## 2020-05-31 NOTE — Progress Notes (Signed)
[place],   No acute findings in CT to explain symptoms.  Small fat umbilical hernia noted.  You do have some fat around liver. Diet and exercise are the best treatment for that. Avoid excessive alcohol.   -Tandy Gaw PA-C

## 2020-05-31 NOTE — Progress Notes (Signed)
No C.diff or bacteria found in stool culture.

## 2020-06-01 ENCOUNTER — Encounter: Payer: Self-pay | Admitting: Osteopathic Medicine

## 2020-06-02 ENCOUNTER — Encounter: Payer: Self-pay | Admitting: Osteopathic Medicine

## 2020-06-02 DIAGNOSIS — G894 Chronic pain syndrome: Secondary | ICD-10-CM

## 2020-06-02 DIAGNOSIS — M5030 Other cervical disc degeneration, unspecified cervical region: Secondary | ICD-10-CM

## 2020-06-23 ENCOUNTER — Ambulatory Visit: Payer: Medicare PPO | Admitting: Gastroenterology

## 2020-06-27 ENCOUNTER — Other Ambulatory Visit: Payer: Self-pay | Admitting: Osteopathic Medicine

## 2020-06-30 ENCOUNTER — Other Ambulatory Visit: Payer: Self-pay

## 2020-06-30 DIAGNOSIS — F329 Major depressive disorder, single episode, unspecified: Secondary | ICD-10-CM

## 2020-06-30 DIAGNOSIS — F419 Anxiety disorder, unspecified: Secondary | ICD-10-CM

## 2020-06-30 DIAGNOSIS — F32A Depression, unspecified: Secondary | ICD-10-CM

## 2020-06-30 MED ORDER — LORAZEPAM 0.5 MG PO TABS: 0.5000 mg | ORAL_TABLET | Freq: Every day | ORAL | 0 refills | Status: DC | PRN

## 2020-06-30 NOTE — Telephone Encounter (Signed)
Medication refill request  Last refill- 04/22/2020 Last Ov- 05/26/2020

## 2020-07-01 ENCOUNTER — Encounter: Payer: Self-pay | Admitting: Osteopathic Medicine

## 2020-07-01 NOTE — Telephone Encounter (Signed)
Immunization History  Administered Date(s) Administered  . Influenza, Quadrivalent, Recombinant, Inj, Pf 07/30/2019  . Influenza,inj,Quad PF,6+ Mos 07/18/2018  . Influenza-Unspecified 07/30/2019  . PFIZER SARS-COV-2 Vaccination 02/03/2020, 02/24/2020  . Tdap 01/24/2015   COVID vaccine? Boosters are not recommended, at least not yet There is some study on boosters for transplant patients / other immune compromise. Things may change in the future. No booster needed now.  Please call to confirm that's what she's talking about?

## 2020-07-15 DIAGNOSIS — M5030 Other cervical disc degeneration, unspecified cervical region: Secondary | ICD-10-CM | POA: Diagnosis not present

## 2020-07-15 DIAGNOSIS — M546 Pain in thoracic spine: Secondary | ICD-10-CM | POA: Diagnosis not present

## 2020-07-15 DIAGNOSIS — G8929 Other chronic pain: Secondary | ICD-10-CM | POA: Diagnosis not present

## 2020-07-15 DIAGNOSIS — M545 Low back pain: Secondary | ICD-10-CM | POA: Diagnosis not present

## 2020-07-15 DIAGNOSIS — M542 Cervicalgia: Secondary | ICD-10-CM | POA: Diagnosis not present

## 2020-07-15 DIAGNOSIS — M4802 Spinal stenosis, cervical region: Secondary | ICD-10-CM | POA: Diagnosis not present

## 2020-07-26 ENCOUNTER — Encounter: Payer: Self-pay | Admitting: Osteopathic Medicine

## 2020-07-26 DIAGNOSIS — M50321 Other cervical disc degeneration at C4-C5 level: Secondary | ICD-10-CM | POA: Diagnosis not present

## 2020-07-26 DIAGNOSIS — R937 Abnormal findings on diagnostic imaging of other parts of musculoskeletal system: Secondary | ICD-10-CM

## 2020-07-26 DIAGNOSIS — G8929 Other chronic pain: Secondary | ICD-10-CM | POA: Diagnosis not present

## 2020-07-26 DIAGNOSIS — M5184 Other intervertebral disc disorders, thoracic region: Secondary | ICD-10-CM | POA: Diagnosis not present

## 2020-07-26 DIAGNOSIS — M546 Pain in thoracic spine: Secondary | ICD-10-CM | POA: Diagnosis not present

## 2020-07-26 DIAGNOSIS — M4802 Spinal stenosis, cervical region: Secondary | ICD-10-CM | POA: Diagnosis not present

## 2020-07-26 DIAGNOSIS — M47812 Spondylosis without myelopathy or radiculopathy, cervical region: Secondary | ICD-10-CM | POA: Diagnosis not present

## 2020-07-26 DIAGNOSIS — M5081 Other cervical disc disorders,  high cervical region: Secondary | ICD-10-CM | POA: Diagnosis not present

## 2020-07-29 ENCOUNTER — Encounter: Payer: Self-pay | Admitting: Osteopathic Medicine

## 2020-07-29 ENCOUNTER — Ambulatory Visit (INDEPENDENT_AMBULATORY_CARE_PROVIDER_SITE_OTHER): Payer: Medicare PPO

## 2020-07-29 ENCOUNTER — Other Ambulatory Visit: Payer: Self-pay

## 2020-07-29 DIAGNOSIS — R918 Other nonspecific abnormal finding of lung field: Secondary | ICD-10-CM | POA: Diagnosis not present

## 2020-07-29 DIAGNOSIS — R937 Abnormal findings on diagnostic imaging of other parts of musculoskeletal system: Secondary | ICD-10-CM | POA: Diagnosis not present

## 2020-07-29 NOTE — Telephone Encounter (Signed)
Routing to covering provider. Referral pended.  

## 2020-08-04 ENCOUNTER — Ambulatory Visit: Payer: Medicare PPO | Admitting: Gastroenterology

## 2020-08-04 ENCOUNTER — Encounter: Payer: Self-pay | Admitting: Internal Medicine

## 2020-08-04 NOTE — Progress Notes (Signed)
Virtual Visit via Video Note The purpose of this virtual visit is to provide medical care while limiting exposure to the novel coronavirus.    Consent was obtained for video visit:  Yes.   Answered questions that patient had about telehealth interaction:  Yes.   I discussed the limitations, risks, security and privacy concerns of performing an evaluation and management service by telemedicine. I also discussed with the patient that there may be a patient responsible charge related to this service. The patient expressed understanding and agreed to proceed.  Pt location: Home Physician Location: office Name of referring provider:  Emeterio Reeve, DO I connected with Cassandra Reid at patients initiation/request on 08/05/2020 at  8:50 AM EDT by video enabled telemedicine application and verified that I am speaking with the correct person using two identifiers. Pt MRN:  [id number] Pt DOB:  05-05-1956 Video Participants:  Cassandra Reid   History of Present Illness:  Cassandra Reid is a 64 year old female with chronic kidney disease, diabetes, hypertension, fibromyalgia, and history of cerebral aneurysm status post coiling who follows up for cerebral aneurysm.  Neurosurgery note reviewed.  UPDATE: Last seen in initial consultation in November 2019.  MRA of head from 12/15/2018 personally reviewed and showed artifact from aneurysm clipping obscuring the left circle-of-Willis, as well as a probable 2 x 4 mm supraclinoid right ICA infundibulum (rather than aneurysm).  For headaches, she started gabapentin 382m three times daily.  As it was ineffective, she was started on tizanidine 240mthree times daily and tapered off of gabapentin.  She reports increased habitual headaches a month ago.  She feels a pressure on top of head as well as paroxysmal stabbing pain all over the head.  The head pressure lasts 1 to 2 days and occurs once a week.  The paroxsymal pain lasts a minute off and on during the  day and occurs daily.  About a month ago, she started getting twitches on the right top of her mouth and under her right eye.  She says she feels a little more off-balance and more forgetful. She is back on gabapentin 30024mID but was told not to take tizanidine.  She takes naproxen sparingly (she takes it once a week)   No new stressors.  However, she has been having neck pain radiating   [facility] showed moderate degenerative disc disease at C5-6 with left paracentral disc herniation possibly affecting the left C6 nerve root, as well as moderate to severe degenerative disc disease at C6-7 with bilateral uncovertebral spurs and moderate bilateral neuroforaminal stenosis.   She has been using heating pads.  She has upcoming appointment to discuss results and treatment options.  She was prescribed tramadol but has not taken it.    HISTORY: She had clipping and then coiling for non-ruptured aneurysm x2 about 14 years ago.  Her last image was a couple of years ago.  She reports that they saw another aneurysm but nothing that yet required intervention.    Since the surgeries, she reports spasms on top and side of head.  They occur off and on for a couple of days, occurring every 5 days.  She denies associated nausea, vomiting, photophobia, phonophobia, visual disturbance or unilateral numbness or weakness.  She was prescribed gabapentin by her prior neurologist but she never started it until she could establish care.  Prior treatment included tizanidine which was helpful but  no longer covered by insurance.  She also has fibromyalgia.  She also has significant anxiety.   Past Medical History: Past Medical History:  Diagnosis Date  . Anxiety   . Arthritis   . Asthma   . Cerebral aneurysm without rupture   . Chronic kidney disease   . CKD stage 3 due to type 2 diabetes mellitus ([place]) 04/15/2019  . Depression   . Diabetes  mellitus without complication ([place])   . Fibromyalgia   . Former smoker   . GERD (gastroesophageal reflux disease)   . Heart murmur   . History of chicken pox   . History of colon polyps   . Hypertension   . IBS (irritable bowel syndrome)   . Legally blind   . Migraines     Medications: Outpatient Encounter Medications as of 08/05/2020  Medication Sig Note  . Accu-Chek FastClix Lancets MISC TEST BLOOD SUGAR FOUR TIMES DAILY AS DIRECTED   . amLODipine (NORVASC) 10 MG tablet Take 1 tablet (10 mg total) by mouth daily.   . Ascorbic Acid (VITAMIN C PO) Take 1 tablet by mouth daily.   Marland Kitchen atorvastatin (LIPITOR) 40 MG tablet TAKE 1 TABLET AT BEDTIME   . Blood Glucose Monitoring Suppl (ACCU-CHEK AVIVA PLUS) w/Device KIT 1 each by Does not apply route as directed. E11.22   . fluticasone (FLONASE) 50 MCG/ACT nasal spray Place 1 spray into both nostrils daily as needed for allergies or rhinitis.   . furosemide (LASIX) 40 MG tablet Take 1 tablet (40 mg total) by mouth daily.   Marland Kitchen gabapentin (NEURONTIN) 300 MG capsule TAKE 1 TO 2 CAPSULES THREE TIMES DAILY   . glucose blood (ACCU-CHEK GUIDE) test strip Use as instructed to check blood sugar 4 times daily. Dx U88.28   . insulin NPH-regular Human (NOVOLIN 70/30 RELION) (70-30) 100 UNIT/ML injection Inject 50 Units into the skin 2 (two) times daily with a meal.   . Insulin Pen Needle 32G X 4 MM MISC For subcutaneous use with insulin   . lactulose (CHRONULAC) 10 GM/15ML solution TAKE 15-30 MLS (10-20 G TOTAL) 2 (TWO) TIMES DAILY AS NEEDED FOR MILD CONSTIPATION OR MODERATE CONSTIPATION.   Marland Kitchen Lancets Misc. (ACCU-CHEK SOFTCLIX LANCET DEV) KIT Use as instructed to check blood sugar 4 times daily. Dx E11.42   . lisinopril (ZESTRIL) 5 MG tablet TAKE 1 TABLET EVERY MORNING   . LORazepam (ATIVAN) 0.5 MG tablet Take 1 tablet (0.5 mg total) by mouth daily as needed for anxiety.   . magnesium oxide (MAG-OX) 400 (241.3 Mg) MG tablet Take 1 tablet (400 mg total) by  mouth 2 (two) times daily.   . metFORMIN (GLUCOPHAGE-XR) 750 MG 24 hr tablet Take 1 tablet (750 mg total) by mouth 2 (two) times daily with a meal.   . metoprolol tartrate (LOPRESSOR) 50 MG tablet Take 1 tablet (50 mg total) by mouth 2 (two) times daily.   . Multiple Vitamin (MULTIVITAMIN PO) Take 1 tablet by mouth daily.   . naproxen (NAPROSYN) 500 MG tablet Take 1 tablet (500 mg total) by mouth 2 (two) times daily with a meal. Take every day for one week, then use as needed after that   . omeprazole (PRILOSEC) 20 MG capsule Take 1 capsule (20 mg total) by mouth daily. (Patient taking differently: Take 20 mg by mouth daily as needed (heartburn). )   . potassium chloride SA (KLOR-CON) 20 MEQ tablet TAKE 1 TABLET TWICE DAILY   . sertraline (ZOLOFT) 50 MG tablet Take 1  tablet (50 mg total) by mouth daily. 03/27/2020: Pt states she has not really been taking this med  . traMADol (ULTRAM) 50 MG tablet Take by mouth 2 (two) times daily.  05/09/2020: Rx given by pain specialist. Pt has not taken medication yet.    No facility-administered encounter medications on file as of 08/05/2020.    Allergies: No Known Allergies  Family History: Family History  Problem Relation Age of Onset  . Alcohol abuse Mother   . Diabetes Mother   . Hypertension Mother   . Kidney disease Mother   . COPD Father   . Diabetes Father   . Early death Father   . Heart disease Father   . Hyperlipidemia Father   . Hypertension Father   . Diabetes Sister   . Hypertension Sister   . Breast cancer Sister   . Cancer Maternal Grandmother   . Alcohol abuse Maternal Grandfather   . Cancer Maternal Grandfather   . Cancer Paternal Grandmother   . Cancer Paternal Grandfather   . Diabetes Sister   . Hypertension Sister   . Kidney disease Sister     Social History: Social History   Socioeconomic History  . Marital status: Married    Spouse name: [name]  . Number of children: 3  . Years of education: Not on file  .  Highest education level: Associate degree: academic program  Occupational History  . Occupation: disabled  Tobacco Use  . Smoking status: Former Smoker    Packs/day: 1.00    Years: 3.00    Pack years: 3.00    Types: Cigarettes    Quit date: 09/04/1989    Years since quitting: 30.9  . Smokeless tobacco: Never Used  Vaping Use  . Vaping Use: Never used  Substance and Sexual Activity  . Alcohol use: Never  . Drug use: Never  . Sexual activity: Not Currently  Other Topics Concern  . Not on file  Social History Narrative   Patient is right-handed. She lives with her husband in a 2 story house. She walks the dog daily for exercise. She drinks I cup of coffee and 3 glasses of unsweet tea.   Social Determinants of Health   Financial Resource Strain:   . Difficulty of Paying Living Expenses: Not on file  Food Insecurity:   . Worried About Charity fundraiser in the Last Year: Not on file  . Ran Out of Food in the Last Year: Not on file  Transportation Needs:   . Lack of Transportation (Medical): Not on file  . Lack of Transportation (Non-Medical): Not on file  Physical Activity:   . Days of Exercise per Week: Not on file  . Minutes of Exercise per Session: Not on file  Stress:   . Feeling of Stress : Not on file  Social Connections:   . Frequency of Communication with Friends and Family: Not on file  . Frequency of Social Gatherings with Friends and Family: Not on file  . Attends Religious Services: Not on file  . Active Member of Clubs or Organizations: Not on file  . Attends Archivist Meetings: Not on file  . Marital Status: Not on file  Intimate Partner Violence:   . Fear of Current or Ex-Partner: Not on file  . Emotionally Abused: Not on file  . Physically Abused: Not on file  . Sexually Abused: Not on file    Observations/Objective:   There were no vitals taken for this visit. No acute  distress.  Alert and oriented.  Speech fluent and not dysarthric.   Language intact.    Assessment and Plan:   1.  Worsening headaches:  Worsening tension-type headache and stabbing headaches.  May be aggravated by cervicalgia 2.  Cerebral aneurysm 3.  Degenerative disc disease of cervical spine  1.  Increase gabapentin, titrating to 629m three times daily.  If headaches not improved after 2 to 4 weeks on this dose, then would consider changing to nortriptyline.  She will not be starting tramadol. 2.  Naproxen for rescue therapy.  Limit use of pain relievers to no more than 2 days out of week to prevent risk of rebound or medication-overuse headache. 3.  MRI/MRA head 4.  Follow up 6 months.  Follow Up Instructions:    -I discussed the assessment and treatment plan with the patient. The patient was provided an opportunity to ask questions and all were answered. The patient agreed with the plan and demonstrated an understanding of the instructions.   The patient was advised to call back or seek an in-person evaluation if the symptoms worsen or if the condition fails to improve as anticipated.    ADudley Major DO

## 2020-08-05 ENCOUNTER — Telehealth: Payer: Self-pay | Admitting: Neurology

## 2020-08-05 ENCOUNTER — Encounter: Payer: Self-pay | Admitting: Neurology

## 2020-08-05 ENCOUNTER — Telehealth (INDEPENDENT_AMBULATORY_CARE_PROVIDER_SITE_OTHER): Payer: Medicare PPO | Admitting: Neurology

## 2020-08-05 ENCOUNTER — Other Ambulatory Visit: Payer: Self-pay

## 2020-08-05 DIAGNOSIS — M542 Cervicalgia: Secondary | ICD-10-CM

## 2020-08-05 DIAGNOSIS — M5030 Other cervical disc degeneration, unspecified cervical region: Secondary | ICD-10-CM | POA: Diagnosis not present

## 2020-08-05 DIAGNOSIS — I671 Cerebral aneurysm, nonruptured: Secondary | ICD-10-CM | POA: Diagnosis not present

## 2020-08-05 DIAGNOSIS — R519 Headache, unspecified: Secondary | ICD-10-CM

## 2020-08-05 NOTE — Telephone Encounter (Signed)
Patient is returning a missed call to update info for virtual visit today.

## 2020-08-05 NOTE — Patient Instructions (Signed)
1.  Increase gabapentin:  Take 1 pill in AM, 1 pill in afternoon and 2 pills at night for one week  Then 2 pills in AM, 1 pill in afternoon and 2 pills at night for one week  Then 2 pills three times daily 2.  If headache not improved after 2 to 4 weeks of target dose, contact me and we can change medication.

## 2020-08-08 NOTE — Progress Notes (Signed)
Virtual Visit via Video Note  I connected with Cassandra Reid on 08/09/20 at 8:10 AM by a video enabled telemedicine application and verified that I am speaking with the correct person using two identifiers.   I discussed the limitations of evaluation and management by telemedicine and the availability of in person appointments. The patient expressed understanding and agreed to proceed.   -Location of the patient : -Location of the provider : Office -The names of all persons participating in the telemedicine service : Pt and myself           Name: Cassandra Reid  Age/ Sex: 64 y.o., female   MRN/ DOB: [id number], 1955-11-30     PCP: Emeterio Reeve, DO   Reason for Endocrinology Evaluation: Type 2 Diabetes Mellitus  Initial Endocrine Consultative Visit: 11/24/18    PATIENT IDENTIFIER: Cassandra Reid is a 64 y.o. female with a past medical history of DM, mitral valve prolapse, dyslipidemia, OSA , brain aneurysm and HTN . The patient has followed with Endocrinology clinic since 11/24/18 for consultative assistance with management of her diabetes.  Ms. Prajapati moved from [place] to be close to her youngest  daughter   DIABETIC HISTORY:  Ms. Ask was diagnosed with T2DM ~ 2001. She has tried Actos ,does not recall intolerance. Followed by Metformin, then Glipizide then Novolin.  Has tried trulicity, but due to insurance coverage this was stopped. Marland Kitchen Her hemoglobin A1c has ranged 7.9% in 09/2018, peaking at 8.5% in 06/2018.  On her initial visit to our clinic she had an A1c of 7.9%. She was on Glipizide, metformin and insulin mix, we stopped her Glipizide and continued insulin Mix as well as continued metformin.  The patient was lost to follow up  For a year prior to presenting to our clinic by 11/2019 , but then she was not on the insulin mix any more and was on Tresiba and metformin.   SUBJECTIVE:   During the last visit (03/21/2020):A1c 10.7 % . Pt in donut hole, started NPH and  continued metformin     Today (08/08/2020): Cassandra Reid is here for a follow up on diabetes managent. She checks her blood sugars 3 times daily, before meals . The patient has not had hypoglycemic episodes since the last clinic visit.     HOME DIABETES REGIMEN:  NPH 50 units BID Metformin 750 mg BID    GLUCOSE LOG :  08/08/2020 244      235    261   265    08/07/2020 308   309  323  08/06/2020 271   267   306   HISTORY:  Past Medical History:  Past Medical History:  Diagnosis Date  . Anxiety   . Arthritis   . Asthma   . Cerebral aneurysm without rupture   . Chronic kidney disease   . CKD stage 3 due to type 2 diabetes mellitus ([place]) 04/15/2019  . Depression   . Diabetes mellitus without complication ([place])   . Fibromyalgia   . Former smoker   . GERD (gastroesophageal reflux disease)   . Heart murmur   . History of chicken pox   . History of colon polyps   . Hypertension   . IBS (irritable bowel syndrome)   . Legally blind   . Migraines    Past Surgical History:  Past Surgical History:  Procedure Laterality Date  . ABDOMINAL HYSTERECTOMY  2012  . APPENDECTOMY  1972  . CEREBRAL ANEURYSM REPAIR  2004   x 2  . CHOLECYSTECTOMY  2015  . MENISCUS REPAIR Bilateral     Social History:  reports that she quit smoking about 30 years ago. Her smoking use included cigarettes. She has a 3.00 pack-year smoking history. She has never used smokeless tobacco. She reports that she does not drink alcohol and does not use drugs. Family History:  Family History  Problem Relation Age of Onset  . Alcohol abuse Mother   . Diabetes Mother   . Hypertension Mother   . Kidney disease Mother   . COPD Father   . Diabetes Father   . Early death Father   . Heart disease Father   . Hyperlipidemia Father   . Hypertension Father   . Diabetes Sister   . Hypertension Sister   . Breast cancer Sister   . Cancer Maternal Grandmother   . Alcohol abuse Maternal Grandfather   . Cancer  Maternal Grandfather   . Cancer Paternal Grandmother   . Cancer Paternal Grandfather   . Diabetes Sister   . Hypertension Sister   . Kidney disease Sister      HOME MEDICATIONS: Allergies as of 08/09/2020   No Known Allergies     Medication List       Accurate as of August 08, 2020  3:11 PM. If you have any questions, ask your nurse or doctor.        Accu-Chek Aviva Plus w/Device Kit 1 each by Does not apply route as directed. E11.22   Accu-Chek FastClix Lancets Misc TEST BLOOD SUGAR FOUR TIMES DAILY AS DIRECTED   Accu-Chek Guide test strip Generic drug: glucose blood Use as instructed to check blood sugar 4 times daily. Dx E11.42   Accu-Chek Softclix Lancet Dev Kit Use as instructed to check blood sugar 4 times daily. Dx E11.42   amLODipine 10 MG tablet Commonly known as: NORVASC Take 1 tablet (10 mg total) by mouth daily.   atorvastatin 40 MG tablet Commonly known as: LIPITOR TAKE 1 TABLET AT BEDTIME   fluticasone 50 MCG/ACT nasal spray Commonly known as: FLONASE Place 1 spray into both nostrils daily as needed for allergies or rhinitis.   furosemide 40 MG tablet Commonly known as: LASIX Take 1 tablet (40 mg total) by mouth daily.   gabapentin 300 MG capsule Commonly known as: NEURONTIN TAKE 1 TO 2 CAPSULES THREE TIMES DAILY   Insulin Pen Needle 32G X 4 MM Misc For subcutaneous use with insulin   lactulose 10 GM/15ML solution Commonly known as: CHRONULAC TAKE 15-30 MLS (10-20 G TOTAL) 2 (TWO) TIMES DAILY AS NEEDED FOR MILD CONSTIPATION OR MODERATE CONSTIPATION.   lisinopril 5 MG tablet Commonly known as: ZESTRIL TAKE 1 TABLET EVERY MORNING   LORazepam 0.5 MG tablet Commonly known as: ATIVAN Take 1 tablet (0.5 mg total) by mouth daily as needed for anxiety.   magnesium oxide 400 (241.3 Mg) MG tablet Commonly known as: MAG-OX Take 1 tablet (400 mg total) by mouth 2 (two) times daily.   metFORMIN 750 MG 24 hr tablet Commonly known as:  GLUCOPHAGE-XR Take 1 tablet (750 mg total) by mouth 2 (two) times daily with a meal.   metoprolol tartrate 50 MG tablet Commonly known as: LOPRESSOR Take 1 tablet (50 mg total) by mouth 2 (two) times daily.   MULTIVITAMIN PO Take 1 tablet by mouth daily.   naproxen 500 MG tablet Commonly known as: NAPROSYN Take 1 tablet (500 mg total) by mouth 2 (two) times daily with a  meal. Take every day for one week, then use as needed after that   NovoLIN 70/30 ReliOn (70-30) 100 UNIT/ML injection Generic drug: insulin NPH-regular Human Inject 50 Units into the skin 2 (two) times daily with a meal.   omeprazole 20 MG capsule Commonly known as: PRILOSEC Take 1 capsule (20 mg total) by mouth daily. What changed:   when to take this  reasons to take this   potassium chloride SA 20 MEQ tablet Commonly known as: KLOR-CON TAKE 1 TABLET TWICE DAILY   sertraline 50 MG tablet Commonly known as: ZOLOFT Take 1 tablet (50 mg total) by mouth daily.   traMADol 50 MG tablet Commonly known as: ULTRAM Take by mouth 2 (two) times daily.   VITAMIN C PO Take 1 tablet by mouth daily.         DATA REVIEWED:  Lab Results  Component Value Date   HGBA1C 10.7 (A) 03/21/2020   HGBA1C 10.3 (H) 10/29/2019   HGBA1C 9.1 (H) 07/31/2019   Lab Results  Component Value Date   MICROALBUR 2.9 07/31/2019   LDLCALC 48 07/31/2019   CREATININE 1.05 (H) 04/27/2020   Lab Results  Component Value Date   MICRALBCREAT 16 07/31/2019    Lab Results  Component Value Date   CHOL 120 07/31/2019   HDL 42 (L) 07/31/2019   LDLCALC 48 07/31/2019   TRIG 242 (H) 07/31/2019   CHOLHDL 2.9 07/31/2019         ASSESSMENT / PLAN / RECOMMENDATIONS:   1) Type 2 Diabetes Mellitus, Poorly controlled, With neuropathic complications - Most recent A1c of 10.7 %. Goal A1c < 7.0 %.    - Pt with worsening hyperglycemia, in the past 2-3 weeks  - She has no symptoms of infection, she avoids sugar-sweetened beverages.  -  Her sister who is a Marine scientist supplied her with #15 novolog pen, will use this for correction as below - Sister also supplemented her with Ozempic, but since there's no guaranteed supply, will hold off.  - She is in the donut hole, pt, PCP's office and our office tried to obtain pt assistance program without success.     MEDICATIONS:  Continue Metformin 750 mg BID  Increase Novolin (ReliOn)-N to  60 units before breakfast and bedtime  Correction Scale: Novolog ( BG-130/40)  EDUCATION / INSTRUCTIONS:  BG monitoring instructions: Patient is instructed to check her blood sugars 3 times a day, before breakfast and supper. . Literature supplied for  Rule of 15 for the treatment of hypoglycemia.    F/U in 6 months    Signed electronically by: Mack Guise, MD  [facility]  [address] [place], [place] [address] Phone: [phone] FAX: [phone]   CC: Florinda Marker [address] [facility] [address] [place] [address] Phone: [phone]  Fax: [phone]  Return to Endocrinology clinic as below: Future Appointments  Date Time Provider [place]  08/09/2020  8:10 AM Asahd Can, Melanie Crazier, MD [facility]  08/11/2020  8:30 AM Emeterio Reeve, DO [facility] None  08/25/2020  2:50 PM GI-315 MR 2 GI-315MRI GI-315 [facility]. [facility]  08/25/2020  3:30 PM GI-315 MR 2 GI-315MRI GI-315 [facility]. [facility]  12/16/2020 10:50 AM Pieter Partridge, DO [facility] None

## 2020-08-09 ENCOUNTER — Other Ambulatory Visit: Payer: Self-pay

## 2020-08-09 ENCOUNTER — Encounter: Payer: Self-pay | Admitting: Internal Medicine

## 2020-08-09 ENCOUNTER — Telehealth (INDEPENDENT_AMBULATORY_CARE_PROVIDER_SITE_OTHER): Payer: Medicare PPO | Admitting: Internal Medicine

## 2020-08-09 VITALS — BP 160/80 | HR 77 | Ht 62.0 in | Wt 182.0 lb

## 2020-08-09 DIAGNOSIS — Z794 Long term (current) use of insulin: Secondary | ICD-10-CM

## 2020-08-09 DIAGNOSIS — E1165 Type 2 diabetes mellitus with hyperglycemia: Secondary | ICD-10-CM

## 2020-08-11 ENCOUNTER — Telehealth (INDEPENDENT_AMBULATORY_CARE_PROVIDER_SITE_OTHER): Payer: Medicare PPO | Admitting: Osteopathic Medicine

## 2020-08-11 ENCOUNTER — Other Ambulatory Visit: Payer: Self-pay

## 2020-08-11 ENCOUNTER — Encounter: Payer: Self-pay | Admitting: Osteopathic Medicine

## 2020-08-11 VITALS — BP 167/83 | HR 76 | Temp 96.6°F | Wt 184.0 lb

## 2020-08-11 DIAGNOSIS — F331 Major depressive disorder, recurrent, moderate: Secondary | ICD-10-CM

## 2020-08-11 MED ORDER — SERTRALINE HCL 25 MG PO TABS
25.0000 mg | ORAL_TABLET | Freq: Every day | ORAL | 2 refills | Status: DC
Start: 2020-08-11 — End: 2020-09-08

## 2020-08-11 NOTE — Progress Notes (Signed)
Cassandra Reid is a 64 y.o. female who presents to  [facility] at [facility]  today, 08/11/20, seeking care for the following:  . Recheck mental health . Concerned about recent chest x-ray     ASSESSMENT & PLAN with other pertinent findings:  The encounter diagnosis was Moderate episode of recurrent major depressive disorder (HCC).   Reviewed chest x-ray results again, I have looked through previous records/results I could not find anything mentioning the granuloma that she had concerns about, and recent CT chest in May did not show this.  I think fine to leave it for now since no symptoms and CT looked okay.  Patient had found a bottle of sertraline in her house but does not think she ever took this.  Recommended starting this, given significant depression symptoms recently, especially frequent crying.    Meds ordered this encounter  Medications  . sertraline (ZOLOFT) 25 MG tablet    Sig: Take 1 tablet (25 mg total) by mouth daily.    Dispense:  30 tablet    Refill:  2       Follow-up instructions: Return in about 4 weeks (around 09/08/2020) for VIRTUAL VISIT recheck mental health.                                         BP (!) 167/83   Pulse 76   Temp (!) 96.6 F (35.9 C)   Wt 184 lb (83.5 kg)   SpO2 96%   BMI 33.65 kg/m   Current Meds  Medication Sig  . Accu-Chek FastClix Lancets MISC TEST BLOOD SUGAR FOUR TIMES DAILY AS DIRECTED  . amLODipine (NORVASC) 10 MG tablet Take 1 tablet (10 mg total) by mouth daily.  . Ascorbic Acid (VITAMIN C PO) Take 1 tablet by mouth daily.  Marland Kitchen atorvastatin (LIPITOR) 40 MG tablet TAKE 1 TABLET AT BEDTIME  . Blood Glucose Monitoring Suppl (ACCU-CHEK AVIVA PLUS) w/Device KIT 1 each by Does not apply route as directed. E11.22  . fluticasone (FLONASE) 50 MCG/ACT nasal spray Place 1 spray into both nostrils daily as needed for allergies or rhinitis.  .  furosemide (LASIX) 40 MG tablet Take 1 tablet (40 mg total) by mouth daily.  Marland Kitchen gabapentin (NEURONTIN) 300 MG capsule TAKE 1 TO 2 CAPSULES THREE TIMES DAILY  . glucose blood (ACCU-CHEK GUIDE) test strip Use as instructed to check blood sugar 4 times daily. Dx Y42.99  . insulin NPH-regular Human (NOVOLIN 70/30 RELION) (70-30) 100 UNIT/ML injection Inject 50 Units into the skin 2 (two) times daily with a meal.  . Insulin Pen Needle 32G X 4 MM MISC For subcutaneous use with insulin  . lactulose (CHRONULAC) 10 GM/15ML solution TAKE 15-30 MLS (10-20 G TOTAL) 2 (TWO) TIMES DAILY AS NEEDED FOR MILD CONSTIPATION OR MODERATE CONSTIPATION.  Marland Kitchen Lancets Misc. (ACCU-CHEK SOFTCLIX LANCET DEV) KIT Use as instructed to check blood sugar 4 times daily. Dx E11.42  . lisinopril (ZESTRIL) 5 MG tablet TAKE 1 TABLET EVERY MORNING  . LORazepam (ATIVAN) 0.5 MG tablet Take 1 tablet (0.5 mg total) by mouth daily as needed for anxiety.  . magnesium oxide (MAG-OX) 400 (241.3 Mg) MG tablet Take 1 tablet (400 mg total) by mouth 2 (two) times daily.  . metFORMIN (GLUCOPHAGE-XR) 750 MG 24 hr tablet Take 1 tablet (750 mg total) by mouth 2 (two) times daily with  a meal.  . metoprolol tartrate (LOPRESSOR) 50 MG tablet Take 1 tablet (50 mg total) by mouth 2 (two) times daily.  . Multiple Vitamin (MULTIVITAMIN PO) Take 1 tablet by mouth daily.  Marland Kitchen omeprazole (PRILOSEC) 20 MG capsule Take 1 capsule (20 mg total) by mouth daily. (Patient taking differently: Take 20 mg by mouth daily as needed (heartburn). )  . potassium chloride SA (KLOR-CON) 20 MEQ tablet TAKE 1 TABLET TWICE DAILY    No results found for this or any previous visit (from the past 72 hour(s)).  No results found.     All questions at time of visit were answered - patient instructed to contact office with any additional concerns or updates.  ER/RTC precautions were reviewed with the patient as applicable.   Please note: voice recognition software was used to produce  this document, and typos may escape review. Please contact Dr. Sheppard Coil for any needed clarifications.   Total encounter time: 30 minutes.

## 2020-08-16 DIAGNOSIS — M4802 Spinal stenosis, cervical region: Secondary | ICD-10-CM | POA: Diagnosis not present

## 2020-08-16 DIAGNOSIS — M5030 Other cervical disc degeneration, unspecified cervical region: Secondary | ICD-10-CM | POA: Diagnosis not present

## 2020-08-16 DIAGNOSIS — M546 Pain in thoracic spine: Secondary | ICD-10-CM | POA: Diagnosis not present

## 2020-08-16 DIAGNOSIS — M5412 Radiculopathy, cervical region: Secondary | ICD-10-CM | POA: Diagnosis not present

## 2020-08-16 DIAGNOSIS — G8929 Other chronic pain: Secondary | ICD-10-CM | POA: Diagnosis not present

## 2020-08-16 DIAGNOSIS — M545 Low back pain: Secondary | ICD-10-CM | POA: Diagnosis not present

## 2020-08-18 ENCOUNTER — Other Ambulatory Visit: Payer: Self-pay | Admitting: Osteopathic Medicine

## 2020-08-18 DIAGNOSIS — N183 Chronic kidney disease, stage 3 unspecified: Secondary | ICD-10-CM

## 2020-08-18 DIAGNOSIS — E1169 Type 2 diabetes mellitus with other specified complication: Secondary | ICD-10-CM

## 2020-08-18 DIAGNOSIS — E1159 Type 2 diabetes mellitus with other circulatory complications: Secondary | ICD-10-CM

## 2020-08-18 DIAGNOSIS — E785 Hyperlipidemia, unspecified: Secondary | ICD-10-CM

## 2020-08-18 DIAGNOSIS — I152 Hypertension secondary to endocrine disorders: Secondary | ICD-10-CM

## 2020-08-18 DIAGNOSIS — E1122 Type 2 diabetes mellitus with diabetic chronic kidney disease: Secondary | ICD-10-CM

## 2020-08-23 ENCOUNTER — Encounter: Payer: Self-pay | Admitting: Osteopathic Medicine

## 2020-08-23 DIAGNOSIS — F32A Depression, unspecified: Secondary | ICD-10-CM

## 2020-08-23 DIAGNOSIS — F419 Anxiety disorder, unspecified: Secondary | ICD-10-CM

## 2020-08-24 MED ORDER — LORAZEPAM 0.5 MG PO TABS: 0.5000 mg | ORAL_TABLET | Freq: Every day | ORAL | 0 refills | Status: DC | PRN

## 2020-08-25 ENCOUNTER — Other Ambulatory Visit: Payer: Self-pay

## 2020-08-25 ENCOUNTER — Ambulatory Visit
Admission: RE | Admit: 2020-08-25 | Discharge: 2020-08-25 | Disposition: A | Discharge status: Home / Self Care | Payer: Medicare PPO | Source: Ambulatory Visit | Attending: Neurology | Admitting: Neurology

## 2020-08-25 DIAGNOSIS — G9389 Other specified disorders of brain: Secondary | ICD-10-CM

## 2020-08-25 DIAGNOSIS — R519 Headache, unspecified: Secondary | ICD-10-CM

## 2020-08-25 DIAGNOSIS — J341 Cyst and mucocele of nose and nasal sinus: Secondary | ICD-10-CM

## 2020-08-25 DIAGNOSIS — I671 Cerebral aneurysm, nonruptured: Secondary | ICD-10-CM

## 2020-08-25 HISTORY — DX: Other specified disorders of brain: G93.89

## 2020-08-25 HISTORY — DX: Cyst and mucocele of nose and nasal sinus: J34.1

## 2020-08-30 DIAGNOSIS — R194 Change in bowel habit: Secondary | ICD-10-CM | POA: Diagnosis not present

## 2020-08-30 DIAGNOSIS — R12 Heartburn: Secondary | ICD-10-CM | POA: Diagnosis not present

## 2020-08-30 DIAGNOSIS — K219 Gastro-esophageal reflux disease without esophagitis: Secondary | ICD-10-CM | POA: Diagnosis not present

## 2020-08-30 DIAGNOSIS — Z86010 Personal history of colonic polyps: Secondary | ICD-10-CM | POA: Diagnosis not present

## 2020-09-02 ENCOUNTER — Encounter: Payer: Self-pay | Admitting: Osteopathic Medicine

## 2020-09-07 ENCOUNTER — Other Ambulatory Visit: Payer: Self-pay

## 2020-09-07 ENCOUNTER — Ambulatory Visit (INDEPENDENT_AMBULATORY_CARE_PROVIDER_SITE_OTHER): Payer: Medicare PPO | Admitting: Otolaryngology

## 2020-09-07 ENCOUNTER — Encounter (INDEPENDENT_AMBULATORY_CARE_PROVIDER_SITE_OTHER): Payer: Self-pay | Admitting: Otolaryngology

## 2020-09-07 VITALS — Temp 97.2°F

## 2020-09-07 DIAGNOSIS — J341 Cyst and mucocele of nose and nasal sinus: Secondary | ICD-10-CM

## 2020-09-07 DIAGNOSIS — J310 Chronic rhinitis: Secondary | ICD-10-CM

## 2020-09-07 NOTE — Progress Notes (Signed)
HPI: Cassandra Reid is a 64 y.o. female who presents for evaluation of chronic nasal sinus issues.  She apparently has had nasal stuffiness for years.  She also complains of "sinus pressure".  She recently had an MRI scan performed because of history of aneurysm and on review of the MRI scan she had a cyst within the left maxillary sinus.  On review of the scan the cyst occupies the majority of the sinuses but does not extend into the nasal cavity. She always complains of chronic nasal congestion and uses Vicks inhaler as well as occasionally Flonase.  The nasal obstruction alternates from side to side.  She also describes pressure within the sinuses although on review of the MRI scan the sinuses were clear except for the left maxillary sinus. She quit smoking over 30 years ago.  Past Medical History:  Diagnosis Date  . Anxiety   . Arthritis   . Asthma   . Cerebral aneurysm without rupture   . Chronic kidney disease   . CKD stage 3 due to type 2 diabetes mellitus ([place]) 04/15/2019  . Depression   . Diabetes mellitus without complication ([place])   . Fibromyalgia   . Former smoker   . GERD (gastroesophageal reflux disease)   . Heart murmur   . History of chicken pox   . History of colon polyps   . Hypertension   . IBS (irritable bowel syndrome)   . Legally blind   . Migraines    Past Surgical History:  Procedure Laterality Date  . ABDOMINAL HYSTERECTOMY  2012  . APPENDECTOMY  1972  . CEREBRAL ANEURYSM REPAIR  2004   x 2  . CHOLECYSTECTOMY  2015  . MENISCUS REPAIR Bilateral    Social History   Socioeconomic History  . Marital status: Married    Spouse name: [name]  . Number of children: 3  . Years of education: Not on file  . Highest education level: Associate degree: academic program  Occupational History  . Occupation: disabled  Tobacco Use  . Smoking status: Former Smoker    Packs/day: 1.00    Years: 3.00    Pack years: 3.00    Types: Cigarettes    Quit date: 09/04/1989     Years since quitting: 31.0  . Smokeless tobacco: Never Used  Vaping Use  . Vaping Use: Never used  Substance and Sexual Activity  . Alcohol use: Never  . Drug use: Never  . Sexual activity: Not Currently  Other Topics Concern  . Not on file  Social History Narrative   Patient is right-handed. She lives with her husband in a 2 story house. She walks the dog daily for exercise. She drinks I cup of coffee and 3 glasses of unsweet tea.   Social Determinants of Health   Financial Resource Strain:   . Difficulty of Paying Living Expenses: Not on file  Food Insecurity:   . Worried About Charity fundraiser in the Last Year: Not on file  . Ran Out of Food in the Last Year: Not on file  Transportation Needs:   . Lack of Transportation (Medical): Not on file  . Lack of Transportation (Non-Medical): Not on file  Physical Activity:   . Days of Exercise per Week: Not on file  . Minutes of Exercise per Session: Not on file  Stress:   . Feeling of Stress : Not on file  Social Connections:   . Frequency of Communication with Friends and Family: Not on file  .  Frequency of Social Gatherings with Friends and Family: Not on file  . Attends Religious Services: Not on file  . Active Member of Clubs or Organizations: Not on file  . Attends Archivist Meetings: Not on file  . Marital Status: Not on file   Family History  Problem Relation Age of Onset  . Alcohol abuse Mother   . Diabetes Mother   . Hypertension Mother   . Kidney disease Mother   . COPD Father   . Diabetes Father   . Early death Father   . Heart disease Father   . Hyperlipidemia Father   . Hypertension Father   . Diabetes Sister   . Hypertension Sister   . Breast cancer Sister   . Cancer Maternal Grandmother   . Alcohol abuse Maternal Grandfather   . Cancer Maternal Grandfather   . Cancer Paternal Grandmother   . Cancer Paternal Grandfather   . Diabetes Sister   . Hypertension Sister   . Kidney disease  Sister    No Known Allergies Prior to Admission medications   Medication Sig Start Date End Date Taking? Authorizing Provider  Accu-Chek FastClix Lancets MISC TEST BLOOD SUGAR FOUR TIMES DAILY AS DIRECTED 04/20/20  Yes Emeterio Reeve, DO  amLODipine (NORVASC) 10 MG tablet TAKE 1 TABLET EVERY DAY 08/18/20  Yes Breeback, Jade L, PA-C  Ascorbic Acid (VITAMIN C PO) Take 1 tablet by mouth daily.   Yes [provider]  atorvastatin (LIPITOR) 40 MG tablet TAKE 1 TABLET AT BEDTIME 08/18/20  Yes Breeback, Jade L, PA-C  Blood Glucose Monitoring Suppl (ACCU-CHEK AVIVA PLUS) w/Device KIT 1 each by Does not apply route as directed. E11.22 11/30/19  Yes Emeterio Reeve, DO  fluticasone [facility]) 50 MCG/ACT nasal spray Place 1 spray into both nostrils daily as needed for allergies or rhinitis.   Yes [provider]  furosemide (LASIX) 40 MG tablet TAKE 1 TABLET (40 MG TOTAL) BY MOUTH DAILY. 08/18/20  Yes Breeback, Jade L, PA-C  gabapentin (NEURONTIN) 300 MG capsule TAKE 1 TO 2 CAPSULES THREE TIMES DAILY 06/29/20  Yes Emeterio Reeve, DO  glucose blood (ACCU-CHEK GUIDE) test strip Use as instructed to check blood sugar 4 times daily. Dx E11.42 12/01/19  Yes Emeterio Reeve, DO  insulin NPH-regular Human (NOVOLIN 70/30 RELION) (70-30) 100 UNIT/ML injection Inject 50 Units into the skin 2 (two) times daily with a meal. 03/21/20  Yes Shamleffer, Melanie Crazier, MD  Insulin Pen Needle 32G X 4 MM MISC For subcutaneous use with insulin 08/03/19  Yes Trixie Dredge, PA-C  lactulose (CHRONULAC) 10 GM/15ML solution TAKE 15-30 MLS (10-20 G TOTAL) 2 (TWO) TIMES DAILY AS NEEDED FOR MILD CONSTIPATION OR MODERATE CONSTIPATION. 08/18/20  Yes Breeback, Jade L, PA-C  Lancets Misc. (ACCU-CHEK SOFTCLIX LANCET DEV) KIT Use as instructed to check blood sugar 4 times daily. Dx E11.42 12/01/19  Yes Emeterio Reeve, DO  lisinopril (ZESTRIL) 5 MG tablet TAKE 1 TABLET EVERY MORNING 08/18/20  Yes Breeback,  Jade L, PA-C  LORazepam (ATIVAN) 0.5 MG tablet Take 1 tablet (0.5 mg total) by mouth daily as needed for anxiety. 08/24/20  Yes Emeterio Reeve, DO  magnesium oxide (MAG-OX) 400 (241.3 Mg) MG tablet Take 1 tablet (400 mg total) by mouth 2 (two) times daily. 03/27/20  Yes Regalado, [name], MD  metFORMIN (GLUCOPHAGE-XR) 750 MG 24 hr tablet Take 1 tablet (750 mg total) by mouth 2 (two) times daily with a meal. 03/21/20  Yes Shamleffer, Melanie Crazier, MD  metoprolol  tartrate (LOPRESSOR) 50 MG tablet Take 1 tablet (50 mg total) by mouth 2 (two) times daily. 05/25/20  Yes Lelon Perla, MD  Multiple Vitamin (MULTIVITAMIN PO) Take 1 tablet by mouth daily.   Yes [provider]  omeprazole (PRILOSEC) 20 MG capsule Take 1 capsule (20 mg total) by mouth daily. Patient taking differently: Take 20 mg by mouth daily as needed (heartburn).  10/06/19  Yes Emeterio Reeve, DO  potassium chloride SA (KLOR-CON) 20 MEQ tablet TAKE 1 TABLET TWICE DAILY 12/03/19  Yes Emeterio Reeve, DO  sertraline (ZOLOFT) 25 MG tablet Take 1 tablet (25 mg total) by mouth daily. 08/11/20  Yes Emeterio Reeve, DO     Positive ROS: Otherwise negative  All other systems have been reviewed and were otherwise negative with the exception of those mentioned in the HPI and as above.  Physical Exam: Constitutional: Alert, well-appearing, no acute distress Ears: External ears without lesions or tenderness. Ear canals are clear bilaterally with intact, clear TMs.  Nasal: External nose without lesions. Septum is relatively midline.  There are no polyps within the nasal cavity.  Both middle meatus regions are clear with no signs of mucopurulent discharge.  She has moderate size inferior turbinates. Oral: Lips and gums without lesions. Tongue and palate mucosa without lesions. Posterior oropharynx clear. Neck: No palpable adenopathy or masses Respiratory: Breathing comfortably  Skin: No facial/neck lesions or rash  noted.  Procedures  Assessment: Nasal obstruction secondary to chronic rhinitis. Left maxillary sinus retention cyst.  I discussed with her that this is not causing any nasal obstruction but may contribute some to fullness in the left cheek area.  Plan: Discussed with patient concerning her nasal obstruction is more likely related to just chronic rhinitis and would recommend regular use of Flonase 2 sprays each nostril at night as well as saline irrigation during the day.  After decongesting the nose in the office today she breathes much better.  If congestion becomes a chronic problem could consider turbinate reductions to help improve the nasal airway and at that time remove the left maxillary sinus cyst.  But I would recommend conservative treatment initially and reviewed this with her.  Recommended use of Nasacort or Flonase every night.  Radene Journey, MD

## 2020-09-08 ENCOUNTER — Encounter: Payer: Self-pay | Admitting: Osteopathic Medicine

## 2020-09-08 ENCOUNTER — Telehealth (INDEPENDENT_AMBULATORY_CARE_PROVIDER_SITE_OTHER): Payer: Medicare PPO | Admitting: Osteopathic Medicine

## 2020-09-08 VITALS — BP 148/85 | HR 85 | Wt 185.0 lb

## 2020-09-08 DIAGNOSIS — F419 Anxiety disorder, unspecified: Secondary | ICD-10-CM

## 2020-09-08 DIAGNOSIS — F32A Depression, unspecified: Secondary | ICD-10-CM

## 2020-09-08 DIAGNOSIS — F331 Major depressive disorder, recurrent, moderate: Secondary | ICD-10-CM

## 2020-09-08 MED ORDER — LORAZEPAM 0.5 MG PO TABS: 0.5000 mg | ORAL_TABLET | Freq: Every day | ORAL | 0 refills | Status: DC | PRN

## 2020-09-08 MED ORDER — SERTRALINE HCL 50 MG PO TABS
50.0000 mg | ORAL_TABLET | Freq: Every day | ORAL | 1 refills | Status: DC
Start: 2020-09-08 — End: 2020-11-10

## 2020-09-08 NOTE — Progress Notes (Signed)
Virtual Visit via Video (App used: MyChart) Note  I connected with      Cassandra Reid on 09/08/20 at 8:17 AM  by a telemedicine application and verified that I am speaking with the correct person using two identifiers.  Patient is at home I am in office   I discussed the limitations of evaluation and management by telemedicine and the availability of in person appointments. The patient expressed understanding and agreed to proceed.  History of Present Illness: Cassandra Reid is a 64 y.o. female who would like to discuss mental health   Last visit 08/11/20: restarted Zoloft 25 mg given increasing depressive symptoms.   Today 09/08/20: recent separation from spouse, "not doing so well." No SI/HI.  Patient is tearful on interview, but alert/oriented.  Reports significant depression type symptoms and difficulty sleeping since separation.   Depression screen [facility] 2/9 09/08/2020 08/11/2020 05/26/2020  Decreased Interest 3 3 3   Down, Depressed, Hopeless 3 3 3   PHQ - 2 Score 6 6 6   Altered sleeping 3 3 2   Tired, decreased energy 3 3 -  Change in appetite 3 0 0  Feeling bad or failure about yourself  3 3 0  Trouble concentrating 3 0 1  Moving slowly or fidgety/restless 0 0 0  Suicidal thoughts 0 0 0  PHQ-9 Score 21 15 9   Difficult doing work/chores Very difficult - -   GAD 7 : Generalized Anxiety Score 09/08/2020 08/11/2020 05/26/2020 02/04/2020  Nervous, Anxious, on Edge 3 3 3 3   Control/stop worrying 3 3 3 3   Worry too much - different things 3 3 3 3   Trouble relaxing 3 3 3 3   Restless 3 - 1 0  Easily annoyed or irritable 0 0 1 1  Afraid - awful might happen 3 3 1 3   Total GAD 7 Score 18 - 15 16  Anxiety Difficulty Very difficult - Somewhat difficult Somewhat difficult          Observations/Objective: BP (!) 148/85   Pulse 85   Wt 185 lb (83.9 kg)   BMI 33.84 kg/m  BP Readings from Last 3 Encounters:  09/08/20 (!) 148/85  08/11/20 (!) 167/83  08/09/20 (!) 160/80    Exam: Normal Speech.    Lab and Radiology Results No results found for this or any previous visit (from the past 72 hour(s)). No results found.     Assessment and Plan: 64 y.o. female with The primary encounter diagnosis was Anxiety and depression. A diagnosis of Moderate episode of recurrent major depressive disorder (HCC) was also pertinent to this visit.  --> Will increase Zoloft from 25 mg to 50 mg, reminder set to check in with the patient in 2 weeks and if symptoms still poorly controlled at that point can increase to 100 mg. --> Advised okay to take twice daily lorazepam if absolutely needed --> Patient inquired about melatonin for sleep, I said this was fine  PDMP not reviewed this encounter. No orders of the defined types were placed in this encounter.  Meds ordered this encounter  Medications  . sertraline (ZOLOFT) 50 MG tablet    Sig: Take 1 tablet (50 mg total) by mouth daily.    Dispense:  90 tablet    Refill:  1      Follow Up Instructions: Return for Reminder set to check in on patient in 2 weeks, see my chart.    I discussed the assessment and treatment plan with the patient. The patient  was provided an opportunity to ask questions and all were answered. The patient agreed with the plan and demonstrated an understanding of the instructions.   The patient was advised to call back or seek an in-person evaluation if any new concerns, if symptoms worsen or if the condition fails to improve as anticipated.  30 minutes of non-face-to-face time was provided during this encounter.      . . . . . . . . . . . . . Marland Kitchen                   Historical information moved to improve visibility of documentation.  Past Medical History:  Diagnosis Date  . Anxiety   . Arthritis   . Asthma   . Cerebral aneurysm without rupture   . Chronic kidney disease   . CKD stage 3 due to type 2 diabetes mellitus ([place]) 04/15/2019  . Depression   .  Diabetes mellitus without complication ([place])   . Fibromyalgia   . Former smoker   . GERD (gastroesophageal reflux disease)   . Heart murmur   . History of chicken pox   . History of colon polyps   . Hypertension   . IBS (irritable bowel syndrome)   . Legally blind   . Migraines    Past Surgical History:  Procedure Laterality Date  . ABDOMINAL HYSTERECTOMY  2012  . APPENDECTOMY  1972  . CEREBRAL ANEURYSM REPAIR  2004   x 2  . CHOLECYSTECTOMY  2015  . MENISCUS REPAIR Bilateral    Social History   Tobacco Use  . Smoking status: Former Smoker    Packs/day: 1.00    Years: 3.00    Pack years: 3.00    Types: Cigarettes    Quit date: 09/04/1989    Years since quitting: 31.0  . Smokeless tobacco: Never Used  Substance Use Topics  . Alcohol use: Never   family history includes Alcohol abuse in her maternal grandfather and mother; Breast cancer in her sister; COPD in her father; Cancer in her maternal grandfather, maternal grandmother, paternal grandfather, and paternal grandmother; Diabetes in her father, mother, sister, and sister; Early death in her father; Heart disease in her father; Hyperlipidemia in her father; Hypertension in her father, mother, sister, and sister; Kidney disease in her mother and sister.  Medications: Current Outpatient Medications  Medication Sig Dispense Refill  . Accu-Chek FastClix Lancets MISC TEST BLOOD SUGAR FOUR TIMES DAILY AS DIRECTED 408 each 1  . amLODipine (NORVASC) 10 MG tablet TAKE 1 TABLET EVERY DAY 90 tablet 3  . Ascorbic Acid (VITAMIN C PO) Take 1 tablet by mouth daily.    Marland Kitchen atorvastatin (LIPITOR) 40 MG tablet TAKE 1 TABLET AT BEDTIME 90 tablet 1  . Blood Glucose Monitoring Suppl (ACCU-CHEK AVIVA PLUS) w/Device KIT 1 each by Does not apply route as directed. E11.22 1 kit 0  . fluticasone (FLONASE) 50 MCG/ACT nasal spray Place 1 spray into both nostrils daily as needed for allergies or rhinitis.    . furosemide (LASIX) 40 MG tablet TAKE 1  TABLET (40 MG TOTAL) BY MOUTH DAILY. 90 tablet 3  . gabapentin (NEURONTIN) 300 MG capsule TAKE 1 TO 2 CAPSULES THREE TIMES DAILY 540 capsule 1  . glucose blood (ACCU-CHEK GUIDE) test strip Use as instructed to check blood sugar 4 times daily. Dx E11.42 200 each prn  . insulin NPH-regular Human (NOVOLIN 70/30 RELION) (70-30) 100 UNIT/ML injection Inject 50 Units into the skin 2 (two) times  daily with a meal. 10 mL 11  . Insulin Pen Needle 32G X 4 MM MISC For subcutaneous use with insulin 90 each 0  . lactulose (CHRONULAC) 10 GM/15ML solution TAKE 15-30 MLS (10-20 G TOTAL) 2 (TWO) TIMES DAILY AS NEEDED FOR MILD CONSTIPATION OR MODERATE CONSTIPATION. 236 mL 1  . Lancets Misc. (ACCU-CHEK SOFTCLIX LANCET DEV) KIT Use as instructed to check blood sugar 4 times daily. Dx E11.42 1 kit prn  . lisinopril (ZESTRIL) 5 MG tablet TAKE 1 TABLET EVERY MORNING 90 tablet 1  . LORazepam (ATIVAN) 0.5 MG tablet Take 1 tablet (0.5 mg total) by mouth daily as needed for anxiety. 90 tablet 0  . magnesium oxide (MAG-OX) 400 (241.3 Mg) MG tablet Take 1 tablet (400 mg total) by mouth 2 (two) times daily. 30 tablet 0  . metFORMIN (GLUCOPHAGE-XR) 750 MG 24 hr tablet Take 1 tablet (750 mg total) by mouth 2 (two) times daily with a meal. 180 tablet 3  . metoprolol tartrate (LOPRESSOR) 50 MG tablet Take 1 tablet (50 mg total) by mouth 2 (two) times daily. 180 tablet 3  . Multiple Vitamin (MULTIVITAMIN PO) Take 1 tablet by mouth daily.    Marland Kitchen omeprazole (PRILOSEC) 20 MG capsule Take 1 capsule (20 mg total) by mouth daily. (Patient taking differently: Take 20 mg by mouth daily as needed (heartburn). ) 90 capsule 3  . potassium chloride SA (KLOR-CON) 20 MEQ tablet TAKE 1 TABLET TWICE DAILY 180 tablet 1  . sertraline (ZOLOFT) 50 MG tablet Take 1 tablet (50 mg total) by mouth daily. 90 tablet 1   No current facility-administered medications for this visit.   No Known Allergies

## 2020-09-19 ENCOUNTER — Ambulatory Visit: Payer: Medicare PPO | Admitting: Neurology

## 2020-09-21 ENCOUNTER — Ambulatory Visit: Payer: Medicare PPO

## 2020-09-23 ENCOUNTER — Telehealth: Payer: Self-pay | Admitting: Osteopathic Medicine

## 2020-09-23 NOTE — Telephone Encounter (Signed)
Pt dropped off paper work to be filled out for her [employer]. She left an envelope and a stamp for it to mailed back to her. Left in Dr. Mardelle Matte box

## 2020-09-29 ENCOUNTER — Encounter: Payer: Self-pay | Admitting: Osteopathic Medicine

## 2020-10-03 ENCOUNTER — Encounter: Payer: Self-pay | Admitting: Osteopathic Medicine

## 2020-10-07 NOTE — Telephone Encounter (Signed)
Task completed. Form completed and mailed in self addressed envelope. Copy of form sent to scan folder.

## 2020-10-19 ENCOUNTER — Ambulatory Visit (INDEPENDENT_AMBULATORY_CARE_PROVIDER_SITE_OTHER): Payer: Self-pay | Admitting: Otolaryngology

## 2020-10-19 DIAGNOSIS — J343 Hypertrophy of nasal turbinates: Secondary | ICD-10-CM

## 2020-10-19 NOTE — H&P (Signed)
PREOPERATIVE H&P  Chief Complaint: Chronic sinus problems  HPI: Cassandra Reid is a 64 y.o. female who presents for evaluation of chronic sinus problems.  She has trouble breathing through her nose with pressure more on the left side.  She has been treated for sinus infections in the past.  Recent CT scan showed obstruction of left maxillary sinus with a large cyst but remaining sinuses were clear.  She has chronic nasal obstruction with only mild septal deformity and is taken to the operating room at this time for turbinate reductions to help improve her nasal breathing and left maxillary ostia enlargement removal of left maxillary sinuses.  Past Medical History:  Diagnosis Date  . Anxiety   . Arthritis   . Asthma   . Cerebral aneurysm without rupture   . Chronic kidney disease   . CKD stage 3 due to type 2 diabetes mellitus ([place]) 04/15/2019  . Depression   . Diabetes mellitus without complication ([place])   . Fibromyalgia   . Former smoker   . GERD (gastroesophageal reflux disease)   . Heart murmur   . History of chicken pox   . History of colon polyps   . Hypertension   . IBS (irritable bowel syndrome)   . Legally blind   . Migraines    Past Surgical History:  Procedure Laterality Date  . ABDOMINAL HYSTERECTOMY  2012  . APPENDECTOMY  1972  . CEREBRAL ANEURYSM REPAIR  2004   x 2  . CHOLECYSTECTOMY  2015  . MENISCUS REPAIR Bilateral    Social History   Socioeconomic History  . Marital status: Married    Spouse name: [name]  . Number of children: 3  . Years of education: Not on file  . Highest education level: Associate degree: academic program  Occupational History  . Occupation: disabled  Tobacco Use  . Smoking status: Former Smoker    Packs/day: 1.00    Years: 3.00    Pack years: 3.00    Types: Cigarettes    Quit date: 09/04/1989    Years since quitting: 31.1  . Smokeless tobacco: Never Used  Vaping Use  . Vaping Use: Never used  Substance and Sexual Activity   . Alcohol use: Never  . Drug use: Never  . Sexual activity: Not Currently  Other Topics Concern  . Not on file  Social History Narrative   Patient is right-handed. She lives with her husband in a 2 story house. She walks the dog daily for exercise. She drinks I cup of coffee and 3 glasses of unsweet tea.   Social Determinants of Health   Financial Resource Strain:   . Difficulty of Paying Living Expenses: Not on file  Food Insecurity:   . Worried About Charity fundraiser in the Last Year: Not on file  . Ran Out of Food in the Last Year: Not on file  Transportation Needs:   . Lack of Transportation (Medical): Not on file  . Lack of Transportation (Non-Medical): Not on file  Physical Activity:   . Days of Exercise per Week: Not on file  . Minutes of Exercise per Session: Not on file  Stress:   . Feeling of Stress : Not on file  Social Connections:   . Frequency of Communication with Friends and Family: Not on file  . Frequency of Social Gatherings with Friends and Family: Not on file  . Attends Religious Services: Not on file  . Active Member of Clubs or Organizations: Not on  file  . Attends Archivist Meetings: Not on file  . Marital Status: Not on file   Family History  Problem Relation Age of Onset  . Alcohol abuse Mother   . Diabetes Mother   . Hypertension Mother   . Kidney disease Mother   . COPD Father   . Diabetes Father   . Early death Father   . Heart disease Father   . Hyperlipidemia Father   . Hypertension Father   . Diabetes Sister   . Hypertension Sister   . Breast cancer Sister   . Cancer Maternal Grandmother   . Alcohol abuse Maternal Grandfather   . Cancer Maternal Grandfather   . Cancer Paternal Grandmother   . Cancer Paternal Grandfather   . Diabetes Sister   . Hypertension Sister   . Kidney disease Sister    No Known Allergies Prior to Admission medications   Medication Sig Start Date End Date Taking? Authorizing Provider   Accu-Chek FastClix Lancets MISC TEST BLOOD SUGAR FOUR TIMES DAILY AS DIRECTED 04/20/20   Emeterio Reeve, DO  amLODipine (NORVASC) 10 MG tablet TAKE 1 TABLET EVERY DAY 08/18/20   Breeback, Jade L, PA-C  Ascorbic Acid (VITAMIN C PO) Take 1 tablet by mouth daily.    [provider]  atorvastatin (LIPITOR) 40 MG tablet TAKE 1 TABLET AT BEDTIME 08/18/20   Breeback, Jade L, PA-C  Blood Glucose Monitoring Suppl (ACCU-CHEK AVIVA PLUS) w/Device KIT 1 each by Does not apply route as directed. E11.22 11/30/19   Emeterio Reeve, DO  fluticasone [facility]) 50 MCG/ACT nasal spray Place 1 spray into both nostrils daily as needed for allergies or rhinitis.    [provider]  furosemide (LASIX) 40 MG tablet TAKE 1 TABLET (40 MG TOTAL) BY MOUTH DAILY. 08/18/20   Breeback, Jade L, PA-C  gabapentin (NEURONTIN) 300 MG capsule TAKE 1 TO 2 CAPSULES THREE TIMES DAILY 06/29/20   Emeterio Reeve, DO  glucose blood (ACCU-CHEK GUIDE) test strip Use as instructed to check blood sugar 4 times daily. Dx E11.42 12/01/19   Emeterio Reeve, DO  insulin NPH-regular Human (NOVOLIN 70/30 RELION) (70-30) 100 UNIT/ML injection Inject 50 Units into the skin 2 (two) times daily with a meal. 03/21/20   Shamleffer, Melanie Crazier, MD  Insulin Pen Needle 32G X 4 MM MISC For subcutaneous use with insulin 08/03/19   Trixie Dredge, PA-C  lactulose (CHRONULAC) 10 GM/15ML solution TAKE 15-30 MLS (10-20 G TOTAL) 2 (TWO) TIMES DAILY AS NEEDED FOR MILD CONSTIPATION OR MODERATE CONSTIPATION. 08/18/20   Breeback, Jade L, PA-C  Lancets Misc. (ACCU-CHEK SOFTCLIX LANCET DEV) KIT Use as instructed to check blood sugar 4 times daily. Dx E11.42 12/01/19   Emeterio Reeve, DO  lisinopril (ZESTRIL) 5 MG tablet TAKE 1 TABLET EVERY MORNING 08/18/20   Breeback, Jade L, PA-C  LORazepam (ATIVAN) 0.5 MG tablet Take 1 tablet (0.5 mg total) by mouth daily as needed for anxiety. 09/08/20   Emeterio Reeve, DO  magnesium oxide  (MAG-OX) 400 (241.3 Mg) MG tablet Take 1 tablet (400 mg total) by mouth 2 (two) times daily. 03/27/20   Regalado, [name], MD  metFORMIN (GLUCOPHAGE-XR) 750 MG 24 hr tablet Take 1 tablet (750 mg total) by mouth 2 (two) times daily with a meal. 03/21/20   Shamleffer, Melanie Crazier, MD  metoprolol tartrate (LOPRESSOR) 50 MG tablet Take 1 tablet (50 mg total) by mouth 2 (two) times daily. 05/25/20   Lelon Perla, MD  Multiple Vitamin (MULTIVITAMIN PO)  Take 1 tablet by mouth daily.    [provider]  omeprazole (PRILOSEC) 20 MG capsule Take 1 capsule (20 mg total) by mouth daily. Patient taking differently: Take 20 mg by mouth daily as needed (heartburn).  10/06/19   Emeterio Reeve, DO  potassium chloride SA (KLOR-CON) 20 MEQ tablet TAKE 1 TABLET TWICE DAILY 12/03/19   Emeterio Reeve, DO  sertraline (ZOLOFT) 50 MG tablet Take 1 tablet (50 mg total) by mouth daily. 09/08/20   Emeterio Reeve, DO     Positive ROS: Otherwise negative  All other systems have been reviewed and were otherwise negative with the exception of those mentioned in the HPI and as above.  Physical Exam: There were no vitals filed for this visit.  General: Alert, no acute distress Oral: Normal oral mucosa and tonsils Nasal: Clear nasal passages.  She has large turbinates bilaterally with only mild septal deformity.  Both middle meatus regions were clear with no active mucopurulent discharge noted. Neck: No palpable adenopathy or thyroid nodules Ear: Ear canal is clear with normal appearing TMs Cardiovascular: Regular rate and rhythm, no murmur.  Respiratory: Clear to auscultation Neurologic: Alert and oriented x 3   Assessment/Plan: Chronic rhinitis with turbinate hypertrophy causing nasal obstruction.  Left maxillary sinus mucous retention cyst. Plan for turbinate reductions and endoscopic left maxillary ostia enlargement with removal of cyst.   Melony Overly, MD 10/19/2020 1:04 PM

## 2020-10-23 ENCOUNTER — Other Ambulatory Visit: Payer: Self-pay | Admitting: Physician Assistant

## 2020-10-25 ENCOUNTER — Other Ambulatory Visit (HOSPITAL_COMMUNITY): Payer: Medicare PPO

## 2020-10-26 ENCOUNTER — Telehealth (INDEPENDENT_AMBULATORY_CARE_PROVIDER_SITE_OTHER): Payer: Self-pay

## 2020-10-26 NOTE — Progress Notes (Signed)
Rinaldo Cloud at [facility] office notified that pt has not returned our phone calls for PAT. Pt A1c 10.7 and sugars running >300 per chart, she states she will let Dr Ezzard Standing know.

## 2020-10-27 ENCOUNTER — Encounter (HOSPITAL_COMMUNITY): Payer: Self-pay | Admitting: Certified Registered"

## 2020-10-28 ENCOUNTER — Ambulatory Visit (HOSPITAL_BASED_OUTPATIENT_CLINIC_OR_DEPARTMENT_OTHER): Admission: RE | Admit: 2020-10-28 | Payer: Medicare PPO | Source: Home / Self Care | Admitting: Otolaryngology

## 2020-10-28 ENCOUNTER — Encounter (HOSPITAL_BASED_OUTPATIENT_CLINIC_OR_DEPARTMENT_OTHER): Admission: RE | Payer: Self-pay | Source: Home / Self Care

## 2020-10-28 ENCOUNTER — Encounter (HOSPITAL_BASED_OUTPATIENT_CLINIC_OR_DEPARTMENT_OTHER): Payer: Self-pay | Admitting: Otolaryngology

## 2020-10-28 SURGERY — REDUCTION, NASAL TURBINATE
Anesthesia: General

## 2020-10-28 NOTE — Anesthesia Preprocedure Evaluation (Deleted)
Anesthesia Evaluation    Reviewed: Allergy & Precautions, Patient's Chart, lab work & pertinent test results  Airway        Dental   Pulmonary asthma , sleep apnea , former smoker,           Cardiovascular hypertension, Pt. on medications and Pt. on home beta blockers      Neuro/Psych  Headaches, PSYCHIATRIC DISORDERS Anxiety Depression    GI/Hepatic Neg liver ROS, GERD  Medicated,  Endo/Other  diabetes, Type 2, Insulin Dependent, Oral Hypoglycemic Agents  Renal/GU Renal disease     Musculoskeletal  (+) Arthritis , Fibromyalgia -  Abdominal   Peds  Hematology   Anesthesia Other Findings   Reproductive/Obstetrics                             Anesthesia Physical Anesthesia Plan  ASA: II  Anesthesia Plan: General   Post-op Pain Management:    Induction: Intravenous  PONV Risk Score and Plan: 4 or greater and Ondansetron, Dexamethasone, Midazolam and Scopolamine patch - Pre-op  Airway Management Planned: Oral ETT  Additional Equipment: None  Intra-op Plan:   Post-operative Plan: Extubation in OR  Informed Consent:     Dental advisory given  Plan Discussed with: CRNA  Anesthesia Plan Comments: (Ms. Ovens was cancelled due to not receiving her COVID test.   Lab Results      Component                Value               Date                      WBC                      6.8                 03/26/2020                HGB                      13.8                03/26/2020                HCT                      41.5                03/26/2020                MCV                      85.9                03/26/2020                PLT                      176                 03/26/2020            Lab Results      Component                Value  Date                      CREATININE               1.05 (H)            04/27/2020                BUN                      19                   04/27/2020                NA                       139                 04/27/2020                K                        4.4                 04/27/2020                CL                       102                 04/27/2020                CO2                      22                  04/27/2020            Echo: 1. Left ventricular ejection fraction, by estimation, is 65 to 70%. The  left ventricle has normal function. The left ventricle has no regional  wall motion abnormalities. There is mild left ventricular hypertrophy.  Left ventricular diastolic parameters  are consistent with Grade I diastolic dysfunction (impaired relaxation).  2. Right ventricular systolic function is low normal. The right  ventricular size is normal.  3. Left atrial size was mildly dilated.  4. The mitral valve is grossly normal. Trivial mitral valve  regurgitation.  5. The aortic valve is tricuspid. Aortic valve regurgitation is not  visualized. )       Anesthesia Quick Evaluation

## 2020-10-31 ENCOUNTER — Other Ambulatory Visit: Payer: Self-pay | Admitting: Osteopathic Medicine

## 2020-11-10 ENCOUNTER — Ambulatory Visit: Payer: Self-pay | Admitting: Licensed Clinical Social Worker

## 2020-11-10 ENCOUNTER — Encounter: Payer: Self-pay | Admitting: Family Medicine

## 2020-11-10 ENCOUNTER — Ambulatory Visit: Payer: Medicare PPO | Admitting: Family Medicine

## 2020-11-10 ENCOUNTER — Other Ambulatory Visit: Payer: Self-pay

## 2020-11-10 VITALS — BP 124/62 | HR 73 | Ht 62.0 in | Wt 186.2 lb

## 2020-11-10 DIAGNOSIS — F32A Depression, unspecified: Secondary | ICD-10-CM

## 2020-11-10 DIAGNOSIS — Z794 Long term (current) use of insulin: Secondary | ICD-10-CM | POA: Diagnosis not present

## 2020-11-10 DIAGNOSIS — E1165 Type 2 diabetes mellitus with hyperglycemia: Secondary | ICD-10-CM | POA: Diagnosis not present

## 2020-11-10 DIAGNOSIS — K760 Fatty (change of) liver, not elsewhere classified: Secondary | ICD-10-CM

## 2020-11-10 DIAGNOSIS — I10 Essential (primary) hypertension: Secondary | ICD-10-CM | POA: Diagnosis not present

## 2020-11-10 DIAGNOSIS — G894 Chronic pain syndrome: Secondary | ICD-10-CM

## 2020-11-10 DIAGNOSIS — E1122 Type 2 diabetes mellitus with diabetic chronic kidney disease: Secondary | ICD-10-CM

## 2020-11-10 DIAGNOSIS — N183 Chronic kidney disease, stage 3 unspecified: Secondary | ICD-10-CM | POA: Diagnosis not present

## 2020-11-10 DIAGNOSIS — G4733 Obstructive sleep apnea (adult) (pediatric): Secondary | ICD-10-CM | POA: Diagnosis not present

## 2020-11-10 DIAGNOSIS — M797 Fibromyalgia: Secondary | ICD-10-CM

## 2020-11-10 DIAGNOSIS — E1142 Type 2 diabetes mellitus with diabetic polyneuropathy: Secondary | ICD-10-CM | POA: Diagnosis not present

## 2020-11-10 DIAGNOSIS — H548 Legal blindness, as defined in USA: Secondary | ICD-10-CM | POA: Diagnosis not present

## 2020-11-10 DIAGNOSIS — R5383 Other fatigue: Secondary | ICD-10-CM

## 2020-11-10 DIAGNOSIS — F419 Anxiety disorder, unspecified: Secondary | ICD-10-CM

## 2020-11-10 DIAGNOSIS — M792 Neuralgia and neuritis, unspecified: Secondary | ICD-10-CM | POA: Diagnosis not present

## 2020-11-10 LAB — POCT GLYCOSYLATED HEMOGLOBIN (HGB A1C): Hemoglobin A1C: 9.6 % — AB (ref 4.0–5.6)

## 2020-11-10 LAB — POCT URINALYSIS DIP (MANUAL ENTRY)
Blood, UA: NEGATIVE
Glucose, UA: NEGATIVE mg/dL
Leukocytes, UA: NEGATIVE
Nitrite, UA: NEGATIVE
Spec Grav, UA: >=1.03 — AB (ref 1.010–1.025)
Urobilinogen, UA: 0.2 E.U./dL
pH, UA: 5 (ref 5.0–8.0)

## 2020-11-10 LAB — POCT UA - MICROALBUMIN
Creatinine, POC: 200 mg/dL
Microalbumin Ur, POC: 150 mg/L

## 2020-11-10 MED ORDER — NAPROXEN 500 MG PO TABS: 500.0000 mg | ORAL_TABLET | ORAL | 0 refills | Status: DC

## 2020-11-10 NOTE — Progress Notes (Signed)
SUBJECTIVE:   CHIEF COMPLAINT / HPI:   Establish with new PCP Patient is here to establish with a new PCP. Patient reports moving to [facility] from [place] very recently because her husband of 35 years left her about 3 weeks ago.  Her children subsequently moved her into an apartment here in [place].  She reports the apartment feels very cramped and small, still many boxes to unpack at the move was very recent.  She used to love to cook, however the counter space in her new apartment is so small she does not cook at all.  Also, she does not feel motivated to cook for just herself.  She is from [place], grew up [place] [address]  She has a daughter who lives in [place], a son who lives in [place], and another daughter who is back in [place].  She has 1 cat named Roxie and 1 dog named Occupational psychologist.  She loves to read, mostly nonfiction and history; her favorite Thereasa Parkin is Herschel Senegal who writes a lot about [place].  She loves this because she grew up nearby on [address] as mentioned earlier.  Transportation needs As patient is legally blind and cannot drive herself anywhere, with recent divorce she is now living alone and relies on Uber to get to her appointments and elsewhere.  Due to cost restraints, she does not get out of the apartment much because many [employer]etto Goad trips are pricey.  Therapy She is intermittently tearful throughout the visit.  When asked about having a reliable person to speak with about this such as a therapist, she says that is a good idea but she is afraid that she cannot financially afford extra [employer] Goad transportation to and from a therapist office.  She would however like counseling because she thinks this would help her work on her grief, depression, and anxiety.  Fatigue Patient reports extreme fatigue.  Started about a year ago when she moved down to [place] from [place] with her husband.  Says that she had not really done a lot outside of the house  because there was not much to do.  This fatigue, along with the depression and anxiety, have been much worse in the last 3 weeks since her husband left her.  She says she does not do anything out of the house, because there is no point.  As mentioned above, she previously loved to cook but does not have a space or willpower to do so at this time.  She does not feel motivated cook for just herself.  She reports "living on toast" right now.  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  She has history of a brain aneurysm, received brain surgery with a clip and coil procedure, but that there was some sort of complication during this procedure.  She reports that she woke up from the brain surgery with problems with peripheral vision, memory, cognition, and walking.  She does not use the white cane a lot, because she feels self-conscious.  She reports that her husband previously made her feel quite ridiculous for trying to use it; now, she is self-conscious using it even without him present.  She does rely on a 4 footed cane many times for balance and stability, can use this somewhat for mobility aid with her vision.  She hopes for a Kindle for Christmas, as she loves reading and getting to [employer] without being able to  drive is quite difficult.  She reports her vision is deteriorating, but as long as she is able to see a page she would prefer to read rather than listen to books on tape.  Chronic pain Patient endorses longstanding chronic pain which she feels has never really been addressed seriously.  She reports intermittent dull aches all over and without pattern.  Today the pain is in her right bicep and her left forearm. Additionally, her fingers and ankles ache daily.  She has a diagnosis of fibromyalgia in her chart; reports her husband said "that is a made up disease, that means it's all in your head", so she feels a little silly with this diagnosis.  She  also reports a painful banding sensation around her bra line or chest intermittently.  She said that she was previously told she might have costochondritis, although she is not sure what this means and does not know if it explains this pain.  She also reports she has been diagnosed with a hiatal hernia and is unsure if this is related.  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  OBJECTIVE:   BP 124/62   Pulse 73   Ht 5\' 2"  (1.575 m)   Wt 186 lb 3.2 oz (84.5 kg)   SpO2 94%   BMI 34.06 kg/m     PHQ-9:  Depression screen [facility] 2/9 11/10/2020 09/08/2020 08/11/2020  Decreased Interest 3 3 3   Down, Depressed, Hopeless 3 3 3   PHQ - 2 Score 6 6 6   Altered sleeping 2 3 3   Tired, decreased energy 3 3 3   Change in appetite 3 3 0  Feeling bad or failure about yourself  3 3 3   Trouble concentrating 3 3 0  Moving slowly or fidgety/restless - 0 0  Suicidal thoughts 0 0 0  PHQ-9 Score 20 21 15   Difficult doing work/chores - Very difficult -     GAD-7:  GAD 7 : Generalized Anxiety Score 09/08/2020 08/11/2020 05/26/2020 02/04/2020  Nervous, Anxious, on Edge 3 3 3 3   Control/stop worrying 3 3 3 3   Worry too much - different things 3 3 3 3   Trouble relaxing 3 3 3 3   Restless 3 - 1 0  Easily annoyed or irritable 0 0 1 1  Afraid - awful might happen 3 3 1 3   Total GAD 7 Score 18 - 15 16  Anxiety Difficulty Very difficult - Somewhat difficult Somewhat difficult      Physical Exam General: Awake, alert, oriented Cardiovascular: Regular rate and rhythm, S1 and S2 present, no murmurs auscultated Respiratory: Lung fields clear to auscultation bilaterally Extremities: No bilateral lower extremity edema, palpable pedal and pretibial pulses bilaterally Neuro:  Cranial nerves II through X grossly intact, able to move all extremities spontaneously   ASSESSMENT/PLAN:   Anxiety and depression PHQ-9 score today 20. No active SI/HI, no plans for self-harm.  Connected with therapy resources .  Also set her up with the  transportation services, which can take her to and from [facility] health appointments.  She is looking forward to finding a therapist within the [facility] health system so she may have transit.  Legally blind Cannot drive, is legally blind.  Currently paying for to go to and from appointments.  With the help of  Helen Hashimoto, set her up with the [facility] health transportation services.  Now she may call their number directly and arrange no cost transportation for any health appointments within the [facility] health system.  Also filled out the provider portion (part B) of the SCAT application for the [place] transportation disability bus service, placed in clinic "to fax" bin.  Patient has her section of the SCAT application (part A), will fill out and mail in.  She will hopefully be approved and be able to use this bus service for transportation to the grocery store, gym, church, and other places within the community.  Referred to care coordinator for social determinants of health.  They may be able to follow-up more frequently and find more community services that could be useful for her.  Fatigue Likely related to current social situation, depression, and poor diet.  Ordered CBC, CMP, TSH, vitamin D, magnesium, B12 to evaluate any underlying medical etiology for this worsening fatigue.     Fayette Pho, MD [facility]

## 2020-11-10 NOTE — Assessment & Plan Note (Signed)
Likely related to current social situation, depression, and poor diet.  Ordered CBC, CMP, TSH, vitamin D, magnesium, B12 to evaluate any underlying medical etiology for this worsening fatigue.

## 2020-11-10 NOTE — Patient Instructions (Addendum)
It was wonderful to meet you today. Thank you for allowing me to be a part of your care. Below is a short summary of what we discussed at your visit today:  Establishing with your new PCP Thank you for going over all of your health history with me in depth.  We have all your records up-to-date.  Please remember to make an appointment for the near future for a dedicated clinic visit for discussion of your chronic pain.  Transportation needs Thank you for signing a waiver today, we have you set up with the [place] transportation system which can take you to and from [facility] appointments.   The number to call [place] transportation system is [phone].  You may now call this number directly in the future to set up transportation to and from any future healthcare appointments with [facility] health.  Bus program (SCAT program) Please fill out your portion of this paperwork, labeled part A.  As soon as this is finished, mail it into the address on the first page.  I will complete my portion, labeled part B, and send it in as soon as possible.  Hopefully this program can get you set up so you may take the bus around the community to places like the grocery store, church, and the gym.  Fatigue Today we obtained some blood work and urine sample to try and determine why you might be fatigued.  I understand there is a lot going on in your life right now, but I would like to make sure there are no medical reasons for fatigue adding to this.  If your results come back normal or negative, I will send them to you in my chart.  If any results come back positive or abnormal, I will give you a call with the result and the plan to move forward.  If you have any questions or concerns, please do not hesitate to contact us via phone or MyChart message.   Fayette Pho, MD

## 2020-11-10 NOTE — Assessment & Plan Note (Signed)
PHQ-9 score today 20. No active SI/HI, no plans for self-harm.  Connected with therapy resources [email or web address].  Also set her up with the [place] transportation services, which can take her to and from [facility] health appointments.  She is looking forward to finding a therapist within the [facility] health system so she may have transit.

## 2020-11-10 NOTE — Chronic Care Management (AMB) (Signed)
   Social Work  Care Management Consultation  11/10/2020 Name: Cassandra Reid MRN: [id number] DOB: 1956-09-24 Cassandra Reid is a 64 y.o. year old female who sees Fayette Pho, MD for primary care. LCSW was consulted by PCP for information /resources to assistance patient with Transportation Needs  ,   Recommendation: After consultation with provider it is determined that patient may benefit from f/u to make sure she is connected to transportation resources .  Intervention: Patient was not interviewed or contacted during this encounter.  LCSW collaborated with PCP .  Conducted brief assessment and provided recommendations. Relevant information and resources discussed with provider. Provider will give Transportation waiver and SCAT application to patient.   . collaboration with Fayette Pho, MD regarding development and update of comprehensive plan of care as evidenced by provider attestation and co-signature . LCSW faxed transportation waiver to [employer], PCP gave phone number to patient to call . Provided PCP a copy of SCAT application to give to patient   Plan:  1. No further follow up required by LCSW at this time 2. PCP will place formal CCM SDOH  referral for SCAT support  3. Please consult with patient prior to making referral   Review of patient status, including review of consultants reports, relevant laboratory and other test results, and collaboration with appropriate care team members and the patient's provider was performed as part of comprehensive patient evaluation and provision of chronic care management services.      Sammuel Hines, LCSW Care Management & Coordination  [facility]   [phone] 11:39 AM

## 2020-11-10 NOTE — Assessment & Plan Note (Addendum)
Cannot drive, is legally blind.  Currently paying for [employer] to go to and from appointments.  With the help of Helen Hashimoto, set her up with the [facility] health transportation services.  Now she may call their number directly and arrange no cost transportation for any health appointments within the [facility] health system.  Also filled out the provider portion (part B) of the SCAT application for the [place] transportation disability bus service, placed in clinic "to fax" bin.  Patient has her section of the SCAT application (part A), will fill out and mail in.  She will hopefully be approved and be able to use this bus service for transportation to the grocery store, gym, church, and other places within the community.  Referred to care coordinator for social determinants of health.  They may be able to follow-up more frequently and find more community services that could be useful for her.

## 2020-11-11 LAB — CBC WITH DIFFERENTIAL/PLATELET
Basophils Absolute: 0.1 10*3/uL (ref 0.0–0.2)
Basos: 1 %
EOS (ABSOLUTE): 0.1 10*3/uL (ref 0.0–0.4)
Eos: 2 %
Hematocrit: 45.7 % (ref 34.0–46.6)
Hemoglobin: 14.9 g/dL (ref 11.1–15.9)
Immature Grans (Abs): 0 10*3/uL (ref 0.0–0.1)
Immature Granulocytes: 0 %
Lymphocytes Absolute: 1.2 10*3/uL (ref 0.7–3.1)
Lymphs: 14 %
MCH: 28.5 pg (ref 26.6–33.0)
MCHC: 32.6 g/dL (ref 31.5–35.7)
MCV: 88 fL (ref 79–97)
Monocytes Absolute: 0.5 10*3/uL (ref 0.1–0.9)
Monocytes: 6 %
Neutrophils Absolute: 6.7 10*3/uL (ref 1.4–7.0)
Neutrophils: 77 %
Platelets: 189 10*3/uL (ref 150–450)
RBC: 5.22 x10E6/uL (ref 3.77–5.28)
RDW: 13.2 % (ref 11.7–15.4)
WBC: 8.7 10*3/uL (ref 3.4–10.8)

## 2020-11-11 LAB — COMPREHENSIVE METABOLIC PANEL
ALT: 52 IU/L — ABNORMAL HIGH (ref 0–32)
AST: 31 IU/L (ref 0–40)
Albumin/Globulin Ratio: 2.2 (ref 1.2–2.2)
Albumin: 4.9 g/dL — ABNORMAL HIGH (ref 3.8–4.8)
Alkaline Phosphatase: 124 IU/L — ABNORMAL HIGH (ref 44–121)
BUN/Creatinine Ratio: 12 (ref 12–28)
BUN: 16 mg/dL (ref 8–27)
Bilirubin Total: 0.5 mg/dL (ref 0.0–1.2)
CO2: 20 mmol/L (ref 20–29)
Calcium: 9.9 mg/dL (ref 8.7–10.3)
Chloride: 97 mmol/L (ref 96–106)
Creatinine, Ser: 1.31 mg/dL — ABNORMAL HIGH (ref 0.57–1.00)
GFR calc Af Amer: 50 mL/min/{1.73_m2} — ABNORMAL LOW (ref >59)
GFR calc non Af Amer: 43 mL/min/{1.73_m2} — ABNORMAL LOW (ref >59)
Globulin, Total: 2.2 g/dL (ref 1.5–4.5)
Glucose: 270 mg/dL — ABNORMAL HIGH (ref 65–99)
Potassium: 4.1 mmol/L (ref 3.5–5.2)
Sodium: 137 mmol/L (ref 134–144)
Total Protein: 7.1 g/dL (ref 6.0–8.5)

## 2020-11-11 LAB — VITAMIN D 25 HYDROXY (VIT D DEFICIENCY, FRACTURES): Vit D, 25-Hydroxy: 33.5 ng/mL (ref 30.0–100.0)

## 2020-11-11 LAB — MAGNESIUM: Magnesium: 1.4 mg/dL — ABNORMAL LOW (ref 1.6–2.3)

## 2020-11-11 LAB — TSH: TSH: 1.42 u[IU]/mL (ref 0.450–4.500)

## 2020-11-11 LAB — VITAMIN B12: Vitamin B-12: 536 pg/mL (ref 232–1245)

## 2020-11-14 ENCOUNTER — Other Ambulatory Visit: Payer: Self-pay | Admitting: Family Medicine

## 2020-11-14 ENCOUNTER — Encounter: Payer: Self-pay | Admitting: Family Medicine

## 2020-11-14 DIAGNOSIS — Z1211 Encounter for screening for malignant neoplasm of colon: Secondary | ICD-10-CM

## 2020-11-14 NOTE — Progress Notes (Signed)
Per patient request, would like endoscopy and colonoscopy done with [facility] system. Currently scheduled with [facility] on Jan. 8. Will place referral to GI for colonoscopy.   Fayette Pho, MD

## 2020-11-16 ENCOUNTER — Telehealth: Payer: Self-pay

## 2020-11-16 NOTE — Telephone Encounter (Signed)
    MA12/29/2021 1st Attempt  Name: Cassandra Reid   MRN: [id number]   DOB: 1956-05-26   AGE: 64 y.o.   GENDER: female   PCP Fayette Pho, MD.   Referral Reason: Transportation Needs  and counseling, community activities  Interventions: Successful outbound call placed to the patient Patient provided with information about care guide support team and interviewed to confirm resource needs  Follow up plan: Care guide will follow up with patient by phone over the next 7 days   Makeisha Jentsch Pernell Dupre, AAS Paralegal, [facility] Care Guide . Embedded Care Coordination [facility]  Care Management  [address] [place], [place] [address] [email or web address]  [phone]   [email or web address]

## 2020-11-24 HISTORY — PX: COLONOSCOPY: SHX174

## 2020-11-24 HISTORY — PX: UPPER GI ENDOSCOPY: SHX6162

## 2020-11-24 LAB — HM COLONOSCOPY

## 2020-11-25 ENCOUNTER — Telehealth: Payer: Self-pay

## 2020-11-25 NOTE — Telephone Encounter (Signed)
    MA1/05/2021   Name: Cassandra Reid   MRN: [id number]   DOB: Apr 21, 1956   AGE: 65 y.o.   GENDER: female   PCP Fayette Pho, MD.   Referral Reason: Transportation Needs  and counseling and financial  Interventions: Successful outbound call placed to the patient Follow up call placed to the patient to discuss status of referral  Follow up plan: No further follow up planned at this time. The patient has been provided with needed resources. and closing referral   Conley Delisle, AAS Paralegal, [facility] Care Guide . Embedded Care Coordination [facility]  Care Management  [address] [place], [place] [address] [email or web address]  [phone]   [email or web address]

## 2020-12-09 ENCOUNTER — Ambulatory Visit (INDEPENDENT_AMBULATORY_CARE_PROVIDER_SITE_OTHER): Payer: Medicare (Managed Care) | Admitting: Family Medicine

## 2020-12-09 ENCOUNTER — Ambulatory Visit
Admission: RE | Admit: 2020-12-09 | Discharge: 2020-12-09 | Disposition: A | Discharge status: Home / Self Care | Payer: Medicare (Managed Care) | Source: Ambulatory Visit | Attending: Family Medicine | Admitting: Family Medicine

## 2020-12-09 ENCOUNTER — Other Ambulatory Visit: Payer: Self-pay

## 2020-12-09 VITALS — BP 136/72 | HR 66 | Wt 192.0 lb

## 2020-12-09 DIAGNOSIS — Z87312 Personal history of (healed) stress fracture: Secondary | ICD-10-CM

## 2020-12-09 DIAGNOSIS — M79672 Pain in left foot: Secondary | ICD-10-CM

## 2020-12-09 DIAGNOSIS — E1165 Type 2 diabetes mellitus with hyperglycemia: Secondary | ICD-10-CM

## 2020-12-09 DIAGNOSIS — M791 Myalgia, unspecified site: Secondary | ICD-10-CM

## 2020-12-09 DIAGNOSIS — M84375A Stress fracture, left foot, initial encounter for fracture: Secondary | ICD-10-CM

## 2020-12-09 DIAGNOSIS — E1142 Type 2 diabetes mellitus with diabetic polyneuropathy: Secondary | ICD-10-CM | POA: Diagnosis not present

## 2020-12-09 DIAGNOSIS — R748 Abnormal levels of other serum enzymes: Secondary | ICD-10-CM

## 2020-12-09 DIAGNOSIS — M797 Fibromyalgia: Secondary | ICD-10-CM

## 2020-12-09 DIAGNOSIS — N183 Chronic kidney disease, stage 3 unspecified: Secondary | ICD-10-CM

## 2020-12-09 DIAGNOSIS — E1122 Type 2 diabetes mellitus with diabetic chronic kidney disease: Secondary | ICD-10-CM

## 2020-12-09 DIAGNOSIS — K760 Fatty (change of) liver, not elsewhere classified: Secondary | ICD-10-CM

## 2020-12-09 DIAGNOSIS — Z794 Long term (current) use of insulin: Secondary | ICD-10-CM

## 2020-12-09 DIAGNOSIS — G894 Chronic pain syndrome: Secondary | ICD-10-CM

## 2020-12-09 DIAGNOSIS — R5383 Other fatigue: Secondary | ICD-10-CM

## 2020-12-09 MED ORDER — DULOXETINE HCL 20 MG PO CPEP: 20.0000 mg | ORAL_CAPSULE | Freq: Every day | ORAL | 1 refills | Status: DC

## 2020-12-09 MED ORDER — OZEMPIC (0.25 OR 0.5 MG/DOSE) 2 MG/1.5ML ~~LOC~~ SOPN: 0.5000 mg | PEN_INJECTOR | SUBCUTANEOUS | 3 refills | Status: DC

## 2020-12-09 NOTE — Assessment & Plan Note (Signed)
Acute pain c/w previous stress fractures of feet. C/f stress fx today. Per patient, 3 previous XR-confirmed stress fx in feet.  - complete L foot XR series - stiff soled shoe OTC from pharmacy - DEXA scan for possible osteoporosis

## 2020-12-09 NOTE — Assessment & Plan Note (Signed)
Gabapentin not improving pain or function. Discussed duloxetine, patient amenable.  - start duloxetine 20mg  daily, taper up 20mg /week until 60mg  - f/u 4 weeks

## 2020-12-09 NOTE — Assessment & Plan Note (Signed)
>>  ASSESSMENT AND PLAN FOR FIBROMYALGIA WRITTEN ON 12/09/2020  1:08 PM BY LYNN, CATHERINE, MD  Gabapentin not improving pain or function. Discussed duloxetine, patient amenable.  - start duloxetine 20mg  daily, taper up 20mg /week until 60mg  - f/u 4 weeks

## 2020-12-09 NOTE — Patient Instructions (Signed)
It was wonderful to see you today. Thank you for allowing me to be a part of your care. Below is a short summary of what we discussed at your visit today:  Foot pain I am going to have you go to get x-rays of your left foot.  You will go to the [place] imaging center indicated on the map.  You may walk-in for this.  You may find a stiff sole shoe over-the-counter at your pharmacy.  This will help with the pain of walking.  I will give you a call once we have the results back.  Bone density Given that you have a history of stress fractures, I want to do a test to see what your bone density is like.  This is called a DEXA scan.  I have placed this order, you will call the [facility] imaging center ahead of time to schedule an appointment for this.  Chronic pain We will start duloxetine as a long-term management plan for your chronic pain.  Follow the titration below.  I have sent this medicine to pharmacy. Week 1: 20 mg (1 capsule) by mouth daily Week 2: 40 mg (2 capsule) by mouth daily Week 3 and on: 60 mg (3 capsule) by mouth daily  Please return in 3 to 4 weeks to follow-up how you are doing with this.  Diabetes Today we will obtain an A1c measurement to see where your diabetes control is.  Lets start that medicine we spoke about, Ozempic.  It is a once weekly injection medication for control of diabetes.  Also has some protection for kidneys and chronic kidney disease.  You will take 0.5 mL injected into your skin once weekly.  Please return in 3 to 4 weeks to follow-up how you are doing with this.  Kidneys Today we repeated your labs to see where your kidney function is currently.  I will call you with any abnormal results.  Additionally, I have sent a referral to nephrology so we can get you a kidney doctor here in town.   If you have any questions or concerns, please do not hesitate to contact us via phone or MyChart message.   Fayette Pho, MD

## 2020-12-09 NOTE — Assessment & Plan Note (Signed)
Obtained CMP today. Last CMP in Dec 2021 demonstrated elevated Cr of 1.3 (baseline ~1.05). Patient discontinued naproxen use after last labs. No current nephrologist.  - f/u Cr in CMP today - referred to nephrologist within [place] system (for transportation)

## 2020-12-09 NOTE — Assessment & Plan Note (Addendum)
A1c lab collected today, not yet resulted. Last A1c >10. Currently on metformin XR 750 mg BID and Novolog 50 units BID with meals.  - adding ozempic 0.5 mL subcu weekly - f/u 4 weeks - consider increasing metformin to max dose - ask pt to bring all medications with her to next visit

## 2020-12-09 NOTE — Progress Notes (Signed)
SUBJECTIVE:   CHIEF COMPLAINT / HPI:   Left foot pain  One week duration of sharp lateral foot pain in left foot. Started when she got out of bed Monday morning, stepped down with left foot and felt sharp pain and "crackling like little bones breaking". She reports four previous stress fractures of her feet - 2 in each foot. Confirmed by XR 3 times. Fourth time, she simply put on the boot used for the others before. This pain this week consistent with previous stress fractures. Hurts most when walking or pressure placed on foot. Has been wearing supportive shoe, even to sleep, because it feels better with it on.   Chronic pain Continued chronic pain. Has intermittent banding pain across chest at level of bra. Also focal tenderness in predictable points, able to point to them with one finger. Reports increased bone-deep aching pain of bilateral legs, describes them best as the feeling of growing pains. Doesn't know why she has continued pain. This pain leaves her exhausted and prevents her from getting out and doing things because she can never predict when it will begin; since she's not driving herself places due to legal blindness, couldn't get home or away quickly.   Diabetes Patient doing well with novolog and metformin. No missed doses. No hypoglycemic episodes. AM fasting sugars in the 250s, PM sugars around the same. Recent episode of glucose >400 a couple days ago. Apparently her sister is an ED nurse and recently gave her some novolog pens and an ozempic pen; she tried to instruct her on a sliding scale for the novolog insulin, but patient reports that was a bit too complicated for her and she wasn't comfortable with that.   CKD stage 3 History of CKD stage 3 due to diabetes. Creatinine last checked Dec 2021 elevated at 1.3, baseline around 1.05. Patient discontinued naproxen after seeing these lab values. Previously had nephrologist when living in [place], hasn't had one since  moving to [place] several years ago.   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  OBJECTIVE:   BP 136/72   Pulse 66   Wt 192 lb (87.1 kg)   SpO2 94%   BMI 35.12 kg/m    PHQ-9:  Depression screen [facility] 2/9 12/09/2020 11/10/2020 09/08/2020  Decreased Interest 3 3 3   Down, Depressed, Hopeless 3 3 3   PHQ - 2 Score 6 6 6   Altered sleeping 2 2 3   Tired, decreased energy 3 3 3   Change in appetite 0 3 3  Feeling bad or failure about yourself  3 3 3   Trouble concentrating 2 3 3   Moving slowly or fidgety/restless 0 - 0  Suicidal thoughts 0 0 0  PHQ-9 Score 16 20 21   Difficult doing work/chores - - Very difficult  Some recent data might be hidden     GAD-7:  GAD 7 : Generalized Anxiety Score 09/08/2020 08/11/2020 05/26/2020 02/04/2020  Nervous, Anxious, on Edge 3 3 3 3   Control/stop worrying 3 3 3 3   Worry too much - different things 3 3 3 3   Trouble relaxing 3 3 3 3   Restless 3 - 1 0  Easily annoyed or irritable 0 0 1 1  Afraid - awful might happen 3 3 1 3   Total GAD 7 Score 18 - 15 16  Anxiety Difficulty Very difficult - Somewhat difficult Somewhat difficult      Physical Exam General: Awake, alert, oriented Cardiovascular: Regular rate and rhythm, S1 and S2 present, no murmurs auscultated Respiratory: Lung fields clear to auscultation bilaterally Extremities: 1+ bilateral lower extremity edema, palpable pedal and pretibial pulses bilaterally, exquisite point tenderness of lateral left cuboid and proximal left 5th metatarsal, TTP over ventral left arch and over ventral left MTP joints, faint ecchymoses overlying left MTP joints 2-5, left ankle ROM intact and minimally limited by pain, left foot sensation intact Neuro: Cranial nerves II through X grossly intact, able to  move all extremities spontaneously   ASSESSMENT/PLAN:   Foot pain, left Acute pain c/w previous stress fractures of feet. C/f stress fx today. Per patient, 3 previous XR-confirmed stress fx in feet.  - complete L foot XR series - stiff soled shoe OTC from pharmacy - DEXA scan for possible osteoporosis  Type 2 diabetes mellitus with hyperglycemia, with long-term current use of insulin (HCC) A1c lab collected today, not yet resulted. Last A1c >10. Currently on metformin XR 750 mg BID and Novolog 50 units BID with meals.  - adding ozempic 0.5 mL subcu weekly - f/u 4 weeks - consider increasing metformin to max dose - ask pt to bring all medications with her to next visit  CKD stage 3 due to type 2 diabetes mellitus (HCC) Obtained CMP today. Last CMP in Dec 2021 demonstrated elevated Cr of 1.3 (baseline ~1.05). Patient discontinued naproxen use after last labs. No current nephrologist.  - f/u Cr in CMP today - referred to nephrologist within [facility] system (for transportation)  Fibromyalgia Gabapentin not improving pain or function. Discussed duloxetine, patient amenable.  - start duloxetine 20mg  daily, taper up 20mg /week until 60mg  - f/u 4 weeks    , MD  , MD [facility] Medicine [facility]

## 2020-12-10 LAB — COMPREHENSIVE METABOLIC PANEL
ALT: 48 IU/L — ABNORMAL HIGH (ref 0–32)
AST: 32 IU/L (ref 0–40)
Albumin/Globulin Ratio: 1.8 (ref 1.2–2.2)
Albumin: 4.4 g/dL (ref 3.8–4.8)
Alkaline Phosphatase: 109 IU/L (ref 44–121)
BUN/Creatinine Ratio: 18 (ref 12–28)
BUN: 19 mg/dL (ref 8–27)
Bilirubin Total: 0.4 mg/dL (ref 0.0–1.2)
CO2: 22 mmol/L (ref 20–29)
Calcium: 9.9 mg/dL (ref 8.7–10.3)
Chloride: 102 mmol/L (ref 96–106)
Creatinine, Ser: 1.05 mg/dL — ABNORMAL HIGH (ref 0.57–1.00)
GFR calc Af Amer: 65 mL/min/{1.73_m2} (ref >59)
GFR calc non Af Amer: 57 mL/min/{1.73_m2} — ABNORMAL LOW (ref >59)
Globulin, Total: 2.5 g/dL (ref 1.5–4.5)
Glucose: 282 mg/dL — ABNORMAL HIGH (ref 65–99)
Potassium: 4.6 mmol/L (ref 3.5–5.2)
Sodium: 140 mmol/L (ref 134–144)
Total Protein: 6.9 g/dL (ref 6.0–8.5)

## 2020-12-10 LAB — HEMOGLOBIN A1C
Est. average glucose Bld gHb Est-mCnc: 237 mg/dL
Hgb A1c MFr Bld: 9.9 % — ABNORMAL HIGH (ref 4.8–5.6)

## 2020-12-12 ENCOUNTER — Telehealth: Payer: Self-pay

## 2020-12-12 ENCOUNTER — Encounter: Payer: Self-pay | Admitting: Family Medicine

## 2020-12-12 NOTE — Telephone Encounter (Signed)
Called patient to discuss normal imaging results, she states that she may have misunderstood. She has no further questions or concerns at this time.

## 2020-12-12 NOTE — Telephone Encounter (Signed)
Patient LVM on nurse line wanting to talk about recent imaging results. Patient reports she received a letter stating she has a stress fracture, this is concerning to her. Patient would like to know what she can do for this. Please advise.

## 2020-12-14 ENCOUNTER — Other Ambulatory Visit: Payer: Self-pay | Admitting: Family Medicine

## 2020-12-14 DIAGNOSIS — Z794 Long term (current) use of insulin: Secondary | ICD-10-CM

## 2020-12-14 MED ORDER — NOVOLIN 70/30 RELION (70-30) 100 UNIT/ML ~~LOC~~ SUSP: 50.0000 [IU] | Freq: Two times a day (BID) | SUBCUTANEOUS | 11 refills | Status: DC

## 2020-12-15 ENCOUNTER — Other Ambulatory Visit: Payer: Self-pay | Admitting: Family Medicine

## 2020-12-15 DIAGNOSIS — Z1231 Encounter for screening mammogram for malignant neoplasm of breast: Secondary | ICD-10-CM

## 2020-12-16 ENCOUNTER — Ambulatory Visit: Payer: Medicare PPO | Admitting: Neurology

## 2020-12-22 ENCOUNTER — Telehealth: Payer: Self-pay | Admitting: Family Medicine

## 2020-12-22 NOTE — Telephone Encounter (Signed)
Called Cassandra Reid to discuss her ozempic side effects.   Received the following MyChart message regarding her recently initiated ozempic: " I started that Ozempic 5 last Friday.  Starting Saturday have had excessive belching and bloated abdomen along with diarrhea.  Has been 5 days of it.  The belching comes with a sour kind of soda taste.  And to be honest I have never heard belching like this before.  Could this be a side effect and will go away?  I will add that I have had a cold this whole time.  No appetite.  Weak."  Ozempic prescription written at last appointment on 1/21 for 0.5 mg injection subcu weekly.  Patient reported that she was due for her next dose tomorrow and was wondering if she should still take it.  I instructed the patient to skip this dose.  We have an appointment next week, during which we will discuss the side effects and see if they have resolved with stopping the Ozempic.  Given that she has paid quite a bit of money this prescription, she would like to take it if at all possible. She also reports that her blood sugars have been better this week.   Could consider restarting at 0.25 mg injection subcu weekly for a couple weeks and tapering up to 0.5 mg.  Fayette Pho, MD

## 2020-12-28 NOTE — Patient Instructions (Incomplete)
It was wonderful to meet you today. Thank you for allowing me to be a part of your care. Below is a short summary of what we discussed at your visit today:  Diabetes -Start back on the at Ozempic 0.25 per week -Message or call me know if you feel the same symptoms -Keep checking your blood sugars, you are doing a great job   Blood pressure Your blood pressure is actually borderline low, so I am going to back off of your amlodipine.  I will decrease your amlodipine from 10 mg to 5 mg daily.  For the pills you have currently, cut them in half.   -Follow-up in 3 weeks to recheck your blood pressure and also how you are doing with the Ozempic  Cymbalta As we talked about, you may start Cymbalta in a couple weeks after your body has acclimated to the Ozempic.  If you have any questions or concerns, please call us.   If you have any questions or concerns, please do not hesitate to contact us via phone or MyChart message.   Fayette Pho, MD

## 2020-12-29 ENCOUNTER — Encounter: Payer: Self-pay | Admitting: Family Medicine

## 2020-12-29 ENCOUNTER — Other Ambulatory Visit: Payer: Self-pay

## 2020-12-29 ENCOUNTER — Ambulatory Visit: Payer: Medicare (Managed Care) | Admitting: Family Medicine

## 2020-12-29 DIAGNOSIS — E1165 Type 2 diabetes mellitus with hyperglycemia: Secondary | ICD-10-CM | POA: Diagnosis not present

## 2020-12-29 DIAGNOSIS — F419 Anxiety disorder, unspecified: Secondary | ICD-10-CM

## 2020-12-29 DIAGNOSIS — I10 Essential (primary) hypertension: Secondary | ICD-10-CM

## 2020-12-29 DIAGNOSIS — E1142 Type 2 diabetes mellitus with diabetic polyneuropathy: Secondary | ICD-10-CM | POA: Diagnosis not present

## 2020-12-29 DIAGNOSIS — Z794 Long term (current) use of insulin: Secondary | ICD-10-CM

## 2020-12-29 DIAGNOSIS — F32A Depression, unspecified: Secondary | ICD-10-CM

## 2020-12-29 MED ORDER — NOVOLIN 70/30 RELION (70-30) 100 UNIT/ML ~~LOC~~ SUSP: 50.0000 [IU] | Freq: Two times a day (BID) | SUBCUTANEOUS | 11 refills | Status: DC

## 2020-12-29 NOTE — Assessment & Plan Note (Signed)
-   restart ozempic at 0.25 / week x 2-3 weeks - follow up one month for glucose control and side effects - plan to increase to 0.5 / week at follow up

## 2020-12-29 NOTE — Assessment & Plan Note (Signed)
BP today 110/52. Pt endorses some dizziness, attributed it to recent illness. This BP should be considered too low for this patient, given age, comorbid conditions, and side effects.  - reduce amlodipine from 10 mg to 5 mg daily - recheck in 3 weeks at follow up - continue metoprolol as prescribed, pulse 99 today - continue lisinopril as prescribed, concomitant CKD 3 and T2DM

## 2020-12-29 NOTE — Progress Notes (Signed)
SUBJECTIVE:   CHIEF COMPLAINT / HPI:   Diabetes Recently started on ozempic 0.5 ml weekly but experienced adverse side effects of dyspepsia, abdominal bloating, and eructation. She strongly desires continuance of the ozempic because her blood sugar readings have been improved (140s-200s) while taking it and she paid quite a hefty co-pay for it.   Blood pressure Would like to decrease the number of pills she is taking overall. Wonders if she still needs all her BP meds. Endorses some dizziness, but attributes this to recent illness.   Depression, Cymbalta Was slated to start cymbalta to help with anxiety and depression after last appointment; however, didn't want to start two new medications at once (cymbalta and ozempic). Plans to start the cymbalta as instructed with taper schedule once she is stable on and tolerating ozempic well. Like 2-3 weeks from now. Of note, PHQ-9 is high today (20). She indicated some passive suicidality (ex "Sometimes I think at night that if I died in my sleep it wouldn't be the worst thing"). Denies active suicidality or plans to harm herself. She cites her religion and children as protective factors, saying she "would never to that to [her children]."  PERTINENT  PMH / PSH: HTN, OSA, h/o brain aneurysm, GERD, IBS, fatty liver, T2DM, CKD 3, HLD, fibromyalgia, anxiety, depression, chronic pain syndrome  OBJECTIVE:   BP (!) 110/52   Pulse 68   Ht 5\' 2"  (1.575 m)   Wt 186 lb 12.8 oz (84.7 kg)   SpO2 99%   BMI 34.17 kg/m    PHQ-9:  Depression screen [facility] 2/9 12/29/2020 12/09/2020 11/10/2020  Decreased Interest 3 3 3   Down, Depressed, Hopeless 3 3 3   PHQ - 2 Score 6 6 6   Altered sleeping 1 2 2   Tired, decreased energy 3 3 3   Change in appetite 3 0 3  Feeling bad or failure about yourself  3 3 3   Trouble concentrating 3 2 3   Moving slowly or fidgety/restless 0 0 -  Suicidal thoughts 1 0 0  PHQ-9 Score 20 16 20   Difficult doing work/chores - - -  Some  recent data might be hidden     GAD-7:  GAD 7 : Generalized Anxiety Score 09/08/2020 08/11/2020 05/26/2020 02/04/2020  Nervous, Anxious, on Edge 3 3 3 3   Control/stop worrying 3 3 3 3   Worry too much - different things 3 3 3 3   Trouble relaxing 3 3 3 3   Restless 3 - 1 0  Easily annoyed or irritable 0 0 1 1  Afraid - awful might happen 3 3 1 3   Total GAD 7 Score 18 - 15 16  Anxiety Difficulty Very difficult - Somewhat difficult Somewhat difficult     Physical Exam General: Awake, alert, oriented, no acute distress Respiratory: Unlabored respirations, speaking in full sentences, no respiratory distress Extremities: Moving all extremities spontaneously Neuro: Cranial nerves II through X grossly intact Psych: Normal insight and judgement   ASSESSMENT/PLAN:   Type 2 diabetes mellitus with hyperglycemia, with long-term current use of insulin (HCC) - restart ozempic at 0.25 / week x 2-3 weeks - follow up one month for glucose control and side effects - plan to increase to 0.5 / week at follow up  Hypertension BP today 110/52. Pt endorses some dizziness, attributed it to recent illness. This BP should be considered too low for this patient, given age, comorbid conditions, and side effects.  - reduce amlodipine from 10 mg to 5 mg daily - recheck in  3 weeks at follow up - continue metoprolol as prescribed, pulse 99 today - continue lisinopril as prescribed, concomitant CKD 3 and T2DM   Anxiety and depression PHQ-9 score 20 today. Patient endorses passive suicidality. Denies active suicidal ideation or plans to harm herself. Cites children and religion as protective factors.  - recently started with therapy - will start cymbalta in 2-3 weeks after stable on ozempic (doesn't want to start two new meds at once) - awaiting [employer] bus program approval, will provide more opportunities for community involvement     Fayette Pho, MD [facility]

## 2020-12-29 NOTE — Assessment & Plan Note (Signed)
PHQ-9 score 20 today. Patient endorses passive suicidality. Denies active suicidal ideation or plans to harm herself. Cites children and religion as protective factors.  - recently started with therapy - will start cymbalta in 2-3 weeks after stable on ozempic (doesn't want to start two new meds at once) - awaiting [employer] bus program approval, will provide more opportunities for community involvement

## 2021-01-02 ENCOUNTER — Other Ambulatory Visit: Payer: Self-pay | Admitting: Family Medicine

## 2021-01-02 DIAGNOSIS — F32A Depression, unspecified: Secondary | ICD-10-CM

## 2021-01-02 DIAGNOSIS — F419 Anxiety disorder, unspecified: Secondary | ICD-10-CM

## 2021-01-02 DIAGNOSIS — Z794 Long term (current) use of insulin: Secondary | ICD-10-CM

## 2021-01-02 DIAGNOSIS — E1142 Type 2 diabetes mellitus with diabetic polyneuropathy: Secondary | ICD-10-CM

## 2021-01-02 DIAGNOSIS — I10 Essential (primary) hypertension: Secondary | ICD-10-CM

## 2021-01-02 LAB — HM COLONOSCOPY

## 2021-01-02 MED ORDER — AMLODIPINE BESYLATE 10 MG PO TABS: 10.0000 mg | ORAL_TABLET | Freq: Every day | ORAL | 3 refills | Status: DC

## 2021-01-02 MED ORDER — LORAZEPAM 0.5 MG PO TABS: 0.5000 mg | ORAL_TABLET | Freq: Every day | ORAL | 0 refills | Status: DC | PRN

## 2021-01-02 MED ORDER — NOVOLIN 70/30 RELION (70-30) 100 UNIT/ML ~~LOC~~ SUSP: 50.0000 [IU] | Freq: Two times a day (BID) | SUBCUTANEOUS | 11 refills | Status: DC

## 2021-01-09 MED ORDER — LORAZEPAM 0.5 MG PO TABS: 0.5000 mg | ORAL_TABLET | Freq: Every day | ORAL | 0 refills | Status: DC | PRN

## 2021-01-09 MED ORDER — NOVOLIN 70/30 RELION (70-30) 100 UNIT/ML ~~LOC~~ SUSP: 50.0000 [IU] | Freq: Two times a day (BID) | SUBCUTANEOUS | 11 refills | Status: DC

## 2021-01-09 NOTE — Addendum Note (Signed)
Addended by: Veronda Prude on: 01/09/2021 10:16 AM   Modules accepted: Orders

## 2021-01-09 NOTE — Progress Notes (Signed)
Received mychart message regarding issues with prescriptions. Upon chart review, Novolin and ativan were set to print. Resent Novolin electronically.   Attempted to resend ativan per original rx. However, defaults to "print". Called pharmacy to further discuss rx. Phoned in ativan rx as written by provider.   Veronda Prude, RN

## 2021-01-17 ENCOUNTER — Ambulatory Visit: Payer: Medicare (Managed Care) | Admitting: Family Medicine

## 2021-01-17 ENCOUNTER — Other Ambulatory Visit: Payer: Self-pay

## 2021-01-17 ENCOUNTER — Encounter: Payer: Self-pay | Admitting: Family Medicine

## 2021-01-17 VITALS — BP 160/70 | HR 72 | Ht 62.0 in | Wt 182.1 lb

## 2021-01-17 DIAGNOSIS — M810 Age-related osteoporosis without current pathological fracture: Secondary | ICD-10-CM

## 2021-01-17 DIAGNOSIS — E876 Hypokalemia: Secondary | ICD-10-CM

## 2021-01-17 DIAGNOSIS — I10 Essential (primary) hypertension: Secondary | ICD-10-CM | POA: Diagnosis not present

## 2021-01-17 DIAGNOSIS — E1142 Type 2 diabetes mellitus with diabetic polyneuropathy: Secondary | ICD-10-CM

## 2021-01-17 DIAGNOSIS — Z794 Long term (current) use of insulin: Secondary | ICD-10-CM

## 2021-01-17 NOTE — Patient Instructions (Signed)
It was wonderful to see you today. Thank you for allowing me to be a part of your care. Below is a short summary of what we discussed at your visit today:  Blood pressure and pulse - keep your lisinopril and amlodipine the same - Let me know how you are taking the metoprolol. I want to know the full name (metoprolol succinate or tartrate), how much (mg and full or half tab) and how often (once or twice a day). I will make adjustments once I know the details.   DEXA bone scan This will be done at the [facility]. Call them directly to schedule your appointment. This has been ordered, they can see it in your chart. I have attached a map to your paperwork.   Ozempic for diabetes Keep taking the ozempic as we talked about. Let me know if you have any worsened side effects. Give [place] a call or send a message!   Please bring all of your medications to every appointment!  If you have any questions or concerns, please do not hesitate to contact us via phone or MyChart message.   Fayette Pho, MD

## 2021-01-17 NOTE — Progress Notes (Signed)
SUBJECTIVE:   CHIEF COMPLAINT / HPI:   BP recheck Patient currently on amlodipine 5 mg, lisinopril 5 mg, metoprolol 50 mg.  Amlodipine reduced at last visit from 10 mg to 5 mg due to normal BP in clinic and complaints of dizziness.  At that time, metoprolol continued due to normal pulse lisinopril continued due to benefit for concurrent condition of CKD 3. Today, patient reports continued dizziness but better than before. Also reports low pulse, has records in mid-50s intermittently.    Ozempic check in Patient really recently started Ozempic at 0.5 mL/week but experienced GI upset.  Medication was paused with improvement in symptoms.  Ozempic restarted at 0.25 mL/week.  Patient follows up today and reports good tolerance of the med. Has some nausea, but "nothing I can't handle".   PERTINENT  PMH / PSH: HTN, CKD 3, T2DM, diabetic neuropathy, cerebral aneurysm s/p clip procedure, OSA, fatty liver, fibromyalgia, anxiety and depression, HLD  OBJECTIVE:   BP (!) 160/70   Pulse 72   Ht 5\' 2"  (1.575 m)   Wt 182 lb 2 oz (82.6 kg)   SpO2 97%   BMI 33.31 kg/m    PHQ-9:  Depression screen [facility] 2/9 01/17/2021 12/29/2020 12/09/2020  Decreased Interest 2 3 3   Down, Depressed, Hopeless 2 3 3   PHQ - 2 Score 4 6 6   Altered sleeping 2 1 2   Tired, decreased energy 2 3 3   Change in appetite 3 3 0  Feeling bad or failure about yourself  2 3 3   Trouble concentrating 2 3 2   Moving slowly or fidgety/restless 2 0 0  Suicidal thoughts 0 1 0  PHQ-9 Score 17 20 16   Difficult doing work/chores - - -  Some recent data might be hidden     GAD-7:  GAD 7 : Generalized Anxiety Score 09/08/2020 08/11/2020 05/26/2020 02/04/2020  Nervous, Anxious, on Edge 3 3 3 3   Control/stop worrying 3 3 3 3   Worry too much - different things 3 3 3 3   Trouble relaxing 3 3 3 3   Restless 3 - 1 0  Easily annoyed or irritable 0 0 1 1  Afraid - awful might happen 3 3 1 3   Total GAD 7 Score 18 - 15 16  Anxiety Difficulty Very  difficult - Somewhat difficult Somewhat difficult     Physical Exam General: Awake, alert, oriented Cardiovascular: Regular rate and rhythm, S1 and S2 present, no murmurs auscultated Respiratory: Lung fields clear to auscultation bilaterally Extremities: No bilateral lower extremity edema, palpable pedal and pretibial pulses bilaterally Neuro: Cranial nerves II through X grossly intact, able to move all extremities spontaneously   ASSESSMENT/PLAN:   Hypertension Home BP systolics 152-170, diastolics 68-76. Last visit, decreased amlodipine from 10 to 5 mg. Still on metoprolol and lisinopril. Patient taking metoprolol differently form prescribed - taking 25 mg once daily. Likely increased systolics from amlodipine decrease and blood glucose improvement from ozempic.  - continue amlodipine 5 - continue lisinopril - start metoprolol 12.5 mg twice daily - follow up in 2 weeks  Type 2 diabetes mellitus with diabetic polyneuropathy, with long-term current use of insulin (HCC) Tolerating ozempic well. Home glucose measurements fasting ~120, midday ~240. No hypoglycemic episodes.  - continue ozempic - next A1c due 02/15/2021  Osteoporosis History of fragility fractures. Currently with left foot 3rd-5th metatarsal fx in boot per podiatry.  - DEXA scan - based on degree, will start appropriate med therapy     , MD [facility]

## 2021-01-18 ENCOUNTER — Encounter: Payer: Self-pay | Admitting: Family Medicine

## 2021-01-18 DIAGNOSIS — M810 Age-related osteoporosis without current pathological fracture: Secondary | ICD-10-CM

## 2021-01-18 HISTORY — DX: Age-related osteoporosis without current pathological fracture: M81.0

## 2021-01-18 MED ORDER — ACCU-CHEK GUIDE VI STRP: ORAL_STRIP | 99 refills | Status: DC

## 2021-01-18 MED ORDER — ACCU-CHEK FASTCLIX LANCETS MISC: 1 refills | Status: DC

## 2021-01-18 MED ORDER — POTASSIUM CHLORIDE CRYS ER 20 MEQ PO TBCR: 20.0000 meq | EXTENDED_RELEASE_TABLET | Freq: Two times a day (BID) | ORAL | 1 refills | Status: DC

## 2021-01-18 MED ORDER — METOPROLOL TARTRATE 25 MG PO TABS: 12.5000 mg | ORAL_TABLET | Freq: Two times a day (BID) | ORAL | 1 refills | Status: DC

## 2021-01-18 MED ORDER — INSULIN PEN NEEDLE 32G X 4 MM MISC
0 refills | Status: DC
Start: 2021-01-18 — End: 2021-02-21

## 2021-01-18 MED ORDER — ACCU-CHEK SOFTCLIX LANCET DEV KIT
PACK | 99 refills | Status: DC
Start: 2021-01-18 — End: 2022-03-09

## 2021-01-18 NOTE — Assessment & Plan Note (Signed)
History of fragility fractures. Currently with left foot 3rd-5th metatarsal fx in boot per podiatry.  - DEXA scan - based on degree, will start appropriate med therapy

## 2021-01-18 NOTE — Assessment & Plan Note (Addendum)
Home BP systolics 152-170, diastolics 68-76. Last visit, decreased amlodipine from 10 to 5 mg. Still on metoprolol and lisinopril. Patient taking metoprolol differently form prescribed - taking 25 mg once daily. Likely increased systolics from amlodipine decrease and blood glucose improvement from ozempic.  - continue amlodipine 5 - continue lisinopril - start metoprolol 12.5 mg twice daily - follow up in 2 weeks

## 2021-01-18 NOTE — Assessment & Plan Note (Addendum)
Tolerating ozempic well. Home glucose measurements fasting ~120, midday ~240. No hypoglycemic episodes.  - continue ozempic - next A1c due 02/15/2021

## 2021-01-19 ENCOUNTER — Other Ambulatory Visit: Payer: Self-pay | Admitting: Family Medicine

## 2021-01-19 DIAGNOSIS — M791 Myalgia, unspecified site: Secondary | ICD-10-CM

## 2021-01-19 DIAGNOSIS — G4733 Obstructive sleep apnea (adult) (pediatric): Secondary | ICD-10-CM

## 2021-01-19 DIAGNOSIS — I671 Cerebral aneurysm, nonruptured: Secondary | ICD-10-CM

## 2021-01-19 DIAGNOSIS — G894 Chronic pain syndrome: Secondary | ICD-10-CM

## 2021-01-19 DIAGNOSIS — J309 Allergic rhinitis, unspecified: Secondary | ICD-10-CM

## 2021-01-19 DIAGNOSIS — H548 Legal blindness, as defined in USA: Secondary | ICD-10-CM

## 2021-01-19 NOTE — Progress Notes (Signed)
Per patient request, will place referrals for ENT and neuro. Patient would like to be connected with new providers within the [facility], as she benefits greatly from the [employer].   Fayette Pho, MD

## 2021-01-28 ENCOUNTER — Other Ambulatory Visit: Payer: Self-pay | Admitting: Family Medicine

## 2021-01-28 DIAGNOSIS — M791 Myalgia, unspecified site: Secondary | ICD-10-CM

## 2021-01-28 DIAGNOSIS — G894 Chronic pain syndrome: Secondary | ICD-10-CM

## 2021-01-28 DIAGNOSIS — R5383 Other fatigue: Secondary | ICD-10-CM

## 2021-01-28 DIAGNOSIS — M797 Fibromyalgia: Secondary | ICD-10-CM

## 2021-01-30 ENCOUNTER — Other Ambulatory Visit: Payer: Self-pay | Admitting: Family Medicine

## 2021-01-30 ENCOUNTER — Encounter: Payer: Self-pay | Admitting: Family Medicine

## 2021-01-30 DIAGNOSIS — Z1231 Encounter for screening mammogram for malignant neoplasm of breast: Secondary | ICD-10-CM

## 2021-01-31 MED ORDER — ONETOUCH VERIO VI STRP: ORAL_STRIP | 12 refills | Status: DC

## 2021-01-31 MED ORDER — ONETOUCH DELICA LANCETS 33G MISC: 12 refills | Status: DC

## 2021-01-31 MED ORDER — ONETOUCH VERIO W/DEVICE KIT: PACK | 0 refills | Status: DC

## 2021-01-31 MED ORDER — ONETOUCH DELICA LANCING DEV MISC: 0 refills | Status: DC

## 2021-01-31 NOTE — Telephone Encounter (Signed)
Sent glucometer supplies per protocol.   FYI   Veronda Prude, RN

## 2021-02-06 ENCOUNTER — Other Ambulatory Visit: Payer: Self-pay | Admitting: Nephrology

## 2021-02-06 DIAGNOSIS — N183 Chronic kidney disease, stage 3 unspecified: Secondary | ICD-10-CM

## 2021-02-11 ENCOUNTER — Other Ambulatory Visit: Payer: Self-pay | Admitting: Family Medicine

## 2021-02-11 DIAGNOSIS — I10 Essential (primary) hypertension: Secondary | ICD-10-CM

## 2021-02-13 ENCOUNTER — Telehealth: Payer: Self-pay | Admitting: Cardiology

## 2021-02-13 NOTE — Telephone Encounter (Signed)
Received the following message from patient requesting an appointment via mychart: Comments: Follow up        Dizziness when standing     Palpitations

## 2021-02-13 NOTE — Telephone Encounter (Signed)
Left message for patient to call back  

## 2021-02-15 ENCOUNTER — Other Ambulatory Visit: Payer: Self-pay | Admitting: Family Medicine

## 2021-02-15 DIAGNOSIS — E1142 Type 2 diabetes mellitus with diabetic polyneuropathy: Secondary | ICD-10-CM

## 2021-02-15 DIAGNOSIS — Z794 Long term (current) use of insulin: Secondary | ICD-10-CM

## 2021-02-16 ENCOUNTER — Other Ambulatory Visit: Payer: Self-pay | Admitting: Family Medicine

## 2021-02-16 DIAGNOSIS — M797 Fibromyalgia: Secondary | ICD-10-CM

## 2021-02-16 DIAGNOSIS — G894 Chronic pain syndrome: Secondary | ICD-10-CM

## 2021-02-16 DIAGNOSIS — M791 Myalgia, unspecified site: Secondary | ICD-10-CM

## 2021-02-16 DIAGNOSIS — R5383 Other fatigue: Secondary | ICD-10-CM

## 2021-02-17 NOTE — Telephone Encounter (Signed)
Patient requests 90 day refill. Original script includes taper up schedule. Re-prescribed for goal therapeutic dose of 60 mg daily.   Fayette Pho, MD

## 2021-02-20 ENCOUNTER — Ambulatory Visit
Admission: RE | Admit: 2021-02-20 | Discharge: 2021-02-20 | Disposition: A | Discharge status: Home / Self Care | Payer: Medicare (Managed Care) | Source: Ambulatory Visit | Attending: Nephrology | Admitting: Nephrology

## 2021-02-20 DIAGNOSIS — N183 Chronic kidney disease, stage 3 unspecified: Secondary | ICD-10-CM

## 2021-02-21 ENCOUNTER — Other Ambulatory Visit: Payer: Self-pay | Admitting: Family Medicine

## 2021-02-21 DIAGNOSIS — Z794 Long term (current) use of insulin: Secondary | ICD-10-CM

## 2021-02-21 NOTE — Telephone Encounter (Signed)
Left message for pt to call.

## 2021-02-21 NOTE — Telephone Encounter (Signed)
Spoke with pt, for the last several weeks she noticed dizziness when getting out of bed or standing up quickly. She was seen by her PCP and they stopped her amlodipine and decreased her metoprolol to 12.5 mg, she admits to only taking it once daily. Her kidney doctor changed her lisinopril to twice daily. She reports her bp is now running 140/60. She reports they changed her medications because her diastolic bp was 52-53. Explained to the patient she is not getting the benefit of the metoprolol taking it only once daily, she was encouraged to increase to twice daily to help with her palpitations. She has a follow up appointment the end of the month and will track her bp and bring the list and all her medications to that appointment. Pt agreed with this plan.

## 2021-02-22 ENCOUNTER — Other Ambulatory Visit: Payer: Self-pay

## 2021-02-22 ENCOUNTER — Ambulatory Visit: Payer: Medicare (Managed Care) | Admitting: Family Medicine

## 2021-02-22 ENCOUNTER — Encounter: Payer: Self-pay | Admitting: Family Medicine

## 2021-02-22 DIAGNOSIS — E1142 Type 2 diabetes mellitus with diabetic polyneuropathy: Secondary | ICD-10-CM | POA: Diagnosis not present

## 2021-02-22 DIAGNOSIS — F32A Depression, unspecified: Secondary | ICD-10-CM

## 2021-02-22 DIAGNOSIS — E1122 Type 2 diabetes mellitus with diabetic chronic kidney disease: Secondary | ICD-10-CM | POA: Diagnosis not present

## 2021-02-22 DIAGNOSIS — F419 Anxiety disorder, unspecified: Secondary | ICD-10-CM | POA: Diagnosis not present

## 2021-02-22 DIAGNOSIS — K219 Gastro-esophageal reflux disease without esophagitis: Secondary | ICD-10-CM

## 2021-02-22 DIAGNOSIS — Z794 Long term (current) use of insulin: Secondary | ICD-10-CM

## 2021-02-22 DIAGNOSIS — N183 Chronic kidney disease, stage 3 unspecified: Secondary | ICD-10-CM

## 2021-02-22 DIAGNOSIS — E1159 Type 2 diabetes mellitus with other circulatory complications: Secondary | ICD-10-CM | POA: Diagnosis not present

## 2021-02-22 DIAGNOSIS — I10 Essential (primary) hypertension: Secondary | ICD-10-CM

## 2021-02-22 DIAGNOSIS — I152 Hypertension secondary to endocrine disorders: Secondary | ICD-10-CM

## 2021-02-22 MED ORDER — DAPAGLIFLOZIN PROPANEDIOL 5 MG PO TABS
5.0000 mg | ORAL_TABLET | Freq: Every day | ORAL | 3 refills | Status: DC
Start: 2021-02-22 — End: 2021-02-28

## 2021-02-22 MED ORDER — LISINOPRIL 5 MG PO TABS: 5.0000 mg | ORAL_TABLET | Freq: Two times a day (BID) | ORAL | 1 refills | Status: DC

## 2021-02-22 NOTE — Assessment & Plan Note (Addendum)
Doing well. CBG at home 180s-220s. Tolerating ozempic, will continue. New nephrologist just prescribed Farxiga; dose unknown, given separate chart system at [facility]. Patient will check bottle at home and send message with dose for accurate med list here. Follow up one month, due for A1c at that time.

## 2021-02-22 NOTE — Assessment & Plan Note (Signed)
Reports upset stomach, epigastric discomfort, bloated feeling. Unknown if worsened by duloxetine started recently. Does have hx GERD. Esophagitis found on EGD in Jan 2022. No meds on our med list for GERD; however, patient sure she is taking something for it (thinks it's a small oval tan pill). She'll look at her med bottle at home and send a MyChart message with the name and dosage. Will adjust as necessary.

## 2021-02-22 NOTE — Assessment & Plan Note (Signed)
Nephrologist just started her on Farxiga and increased lisinopril from 5mg  daily to 5 mg BID. Will adjust our chart accordingly. Follow up one month.

## 2021-02-22 NOTE — Assessment & Plan Note (Signed)
Started taking duloxetine. Tolerating, has some stomach upset and acid reflux. Unsure if worsened by duloxetine. Will continue at 60 mg daily. Follow up one month.

## 2021-02-22 NOTE — Assessment & Plan Note (Signed)
New nephrologist increased lisinopril from 5 g daily to 5 mg BID. Patient still having dizzy spells and intermittent low pulses. She believes she has measured her pulse as low as the 50s and 60s at home. Usually worse at night. Would be great candidate for ambulatory BP measurement with Dr. Raymondo Band.  - stop metoprolol  - continue lisinopril 5 mg BID and amlodipine 10 mg - refer to Dr. Raymondo Band for ambulatory BP study

## 2021-02-22 NOTE — Progress Notes (Signed)
SUBJECTIVE:   CHIEF COMPLAINT / HPI:   Diabetes Here for diabetes follow-up.  Believes that her glucose is much better on the Ozempic.  Tolerating it well, still has some stomach aches but is not sure if this is GERD or the medicine.  Glucose monitor demonstrates CBG from 180s to 220s, with a couple lows of 70, 93.  She is quite pleased with her progress.  Reports her new kidney doctor also just started her on Farxiga, although she wants to wait a little bit before starting this as she likes to start 1 medicine at a time and she just recently started the duloxetine.  Duloxetine Recently started duloxetine for mood and chronic pain.  Likely started a week and a half ago.  Does report some stomach upset as discussed below.  Unclear if this is side effect from duloxetine or GERD.  Blood pressure medicine, low pulse At her last appointment, her BP regimen includes lisinopril 5 mg daily, metoprolol 12.5 twice daily, amlodipine 10 mg daily.  She was recently referred to a new kidney doctor that she lives here in [place], was seen at [facility].  Reports that nephrologist increased her lisinopril to 5 mg twice daily.  She has been having continued dizzy spells and believes she has low pulse sometimes at home, especially in the evening and overnight.  Has measured it as low as 50s or 60s.  She believes that her blood pressure here in the office is a little higher because she is anxious to come into the doctor.  Stomach pains, bloating She reports some slight pain, a feeling of upset stomach, and a bloated feeling.  Does have a history of GERD and IBS.  Only current related med for this is the lactulose 10 to 20 g twice daily as needed for mild constipation.  No acid reducer on her med list.  She does believe she takes an acid reducer, says it is a small oval pill as tan in color.  She will go home, look at her pill bottle then write me a MyChart message to let me know the name and dosage that we  may adjust as necessary.   PERTINENT  PMH / PSH: HTN, OSA, GERD, T2DM, CKD3, HLD, chronic pain  OBJECTIVE:   BP (!) 142/72   Pulse 81   Wt 174 lb (78.9 kg)   SpO2 97%   BMI 31.83 kg/m    PHQ-9:  Depression screen [facility] 2/9 02/22/2021 01/17/2021 12/29/2020  Decreased Interest 2 2 3   Down, Depressed, Hopeless 2 2 3   PHQ - 2 Score 4 4 6   Altered sleeping 2 2 1   Tired, decreased energy 2 2 3   Change in appetite 3 3 3   Feeling bad or failure about yourself  2 2 3   Trouble concentrating 2 2 3   Moving slowly or fidgety/restless 0 2 0  Suicidal thoughts 0 0 1  PHQ-9 Score 15 17 20   Difficult doing work/chores Extremely dIfficult - -  Some recent data might be hidden     GAD-7:  GAD 7 : Generalized Anxiety Score 09/08/2020 08/11/2020 05/26/2020 02/04/2020  Nervous, Anxious, on Edge 3 3 3 3   Control/stop worrying 3 3 3 3   Worry too much - different things 3 3 3 3   Trouble relaxing 3 3 3 3   Restless 3 - 1 0  Easily annoyed or irritable 0 0 1 1  Afraid - awful might happen 3 3 1 3   Total GAD 7 Score  18 - 15 16  Anxiety Difficulty Very difficult - Somewhat difficult Somewhat difficult     Physical Exam General: Awake, alert, oriented Cardiovascular: Regular rate and rhythm, S1 and S2 present, no murmurs auscultated Respiratory: Lung fields clear to auscultation bilaterally Extremities: No bilateral lower extremity edema, palpable pedal and pretibial pulses bilaterally   ASSESSMENT/PLAN:   Type 2 diabetes mellitus with diabetic polyneuropathy, with long-term current use of insulin (HCC) Doing well. CBG at home 180s-220s. Tolerating ozempic, will continue. New nephrologist just prescribed Farxiga; dose unknown, given separate chart system at [facility]. Patient will check bottle at home and send message with dose for accurate med list here. Follow up one month, due for A1c at that time.   Anxiety and depression Started taking duloxetine. Tolerating, has some stomach upset and acid  reflux. Unsure if worsened by duloxetine. Will continue at 60 mg daily. Follow up one month.   CKD stage 3 due to type 2 diabetes mellitus [facility]) Nephrologist just started her on Farxiga and increased lisinopril from 5mg  daily to 5 mg BID. Will adjust our chart accordingly. Follow up one month.   Hypertension New nephrologist increased lisinopril from 5 g daily to 5 mg BID. Patient still having dizzy spells and intermittent low pulses. She believes she has measured her pulse as low as the 50s and 60s at home. Usually worse at night. Would be great candidate for ambulatory BP measurement with Dr. .  - stop metoprolol  - continue lisinopril 5 mg BID and amlodipine 10 mg - refer to Dr. Raymondo Band for ambulatory BP study  GERD (gastroesophageal reflux disease) Reports upset stomach, epigastric discomfort, bloated feeling. Unknown if worsened by duloxetine started recently. Does have hx GERD. Esophagitis found on EGD in Jan 2022. No meds on our med list for GERD; however, patient sure she is taking something for it (thinks it's a small oval tan pill). She'll look at her med bottle at home and send a MyChart message with the name and dosage. Will adjust as necessary.      Feb 2022, MD [facility], [facility]

## 2021-02-22 NOTE — Patient Instructions (Addendum)
It was wonderful to see you today. Thank you for allowing me to be a part of your care. Below is a short summary of what we discussed at your visit today:  New medicine Duloxetine Previously you were started on duloxetine 60 mg daily to help with both mood and chronic pain.  I am very glad you are tolerating this well.  At this time, we will keep you on this dose.  Acid reflux medicine Please check at home for your acid reflux medication. Send me a message with the name of it so we may put it on your list. We want to ensure you are on an acid reducer to help with your acid reflux.   Blood pressure medicine - STOP the metoprolol for now - Keep taking the amlodipine and lisinopril as prescribed - Our pharmacist Dr. Raymondo Band will schedule a day with you for ambulatory (at home) BP measuring  Diabetic foot exam Today we did a diabetic foot exam as part of your annual diabetes care.  This is to monitor progression of sensory loss on the bottom of your feet.  This is a great time for [place] to cover which areas of your feet you are not able to feel so you may be extra vigilant in your foot care.  Foot fracture Your DEXA scan is currently ordered, please call [facility] to schedule this to have done at your convenience. Our records indicate your mammogram has been scheduled for Mar 21, 2021.  The imaging center will automatically send [place] your results.  Please bring all of your medications to every appointment!  If you have any questions or concerns, please do not hesitate to contact us via phone or MyChart message.   Fayette Pho, MD

## 2021-02-28 ENCOUNTER — Other Ambulatory Visit: Payer: Self-pay | Admitting: Otolaryngology

## 2021-02-28 ENCOUNTER — Other Ambulatory Visit: Payer: Self-pay | Admitting: Family Medicine

## 2021-02-28 DIAGNOSIS — M79672 Pain in left foot: Secondary | ICD-10-CM

## 2021-02-28 DIAGNOSIS — M810 Age-related osteoporosis without current pathological fracture: Secondary | ICD-10-CM

## 2021-02-28 DIAGNOSIS — Z13820 Encounter for screening for osteoporosis: Secondary | ICD-10-CM

## 2021-02-28 MED ORDER — PANTOPRAZOLE SODIUM 20 MG PO TBEC: 40.0000 mg | DELAYED_RELEASE_TABLET | Freq: Every day | ORAL | Status: DC

## 2021-02-28 MED ORDER — DAPAGLIFLOZIN PROPANEDIOL 5 MG PO TABS: 10.0000 mg | ORAL_TABLET | Freq: Every day | ORAL | 3 refills | Status: DC

## 2021-02-28 MED ORDER — LISINOPRIL 5 MG PO TABS: 10.0000 mg | ORAL_TABLET | Freq: Every day | ORAL | 1 refills | Status: DC

## 2021-02-28 NOTE — Addendum Note (Signed)
Addended by: Valetta Close on: 02/28/2021 01:11 AM   Modules accepted: Orders

## 2021-02-28 NOTE — Addendum Note (Signed)
Addended by: Valetta Close on: 02/28/2021 01:09 AM   Modules accepted: Orders

## 2021-03-01 ENCOUNTER — Encounter (HOSPITAL_BASED_OUTPATIENT_CLINIC_OR_DEPARTMENT_OTHER): Payer: Self-pay | Admitting: Otolaryngology

## 2021-03-01 ENCOUNTER — Other Ambulatory Visit: Payer: Self-pay | Admitting: Otolaryngology

## 2021-03-01 ENCOUNTER — Other Ambulatory Visit: Payer: Self-pay

## 2021-03-01 NOTE — Progress Notes (Signed)
Voice message left for Raynelle Fanning at ENT, [facility] requesting orders for patient's surgery on Tuesday 03/07/2021.

## 2021-03-03 ENCOUNTER — Other Ambulatory Visit (HOSPITAL_COMMUNITY): Payer: Medicare (Managed Care)

## 2021-03-07 ENCOUNTER — Ambulatory Visit (HOSPITAL_BASED_OUTPATIENT_CLINIC_OR_DEPARTMENT_OTHER)
Admission: RE | Admit: 2021-03-07 | Payer: Medicare (Managed Care) | Source: Home / Self Care | Admitting: Otolaryngology

## 2021-03-07 SURGERY — EXCISION, NASAL TURBINATE, SUBMUCOSAL
Anesthesia: General | Laterality: Bilateral

## 2021-03-07 NOTE — Progress Notes (Signed)
HPI: FU MVP, palpitations and CP. Patient admitted May 2021 with chest pain. Troponins were normal. Echocardiogram May 2021 showed normal LV function, mild left ventricular hypertrophy, grade 1 diastolic dysfunction, mild left atrial enlargement and trace mitral regurgitation. Monitor 6/21 showed sinus with PACs, PVCs and brief atrial tachycardia. 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 Since last seen, she occasionally has some dyspnea on exertion but no orthopnea, PND or pedal edema.  She continues to have occasional chest pain in the lateral axillary area.  She has had these for years and they are unchanged.  Current Outpatient Medications  Medication Sig Dispense Refill  . Ascorbic Acid (VITAMIN C PO) Take 1 tablet by mouth daily.    Marland Kitchen atorvastatin (LIPITOR) 40 MG tablet Take 1 tablet (40 mg total) by mouth at bedtime. 90 tablet 1  . BD PEN NEEDLE NANO 2ND GEN 32G X 4 MM MISC USE WITH INSULIN 100 each 4  . fluticasone (FLONASE) 50 MCG/ACT nasal spray Place 1 spray into both nostrils daily as needed for allergies or rhinitis.    . furosemide (LASIX) 40 MG tablet Take 1 tablet (40 mg total) by mouth daily. 90 tablet 3  . gabapentin (NEURONTIN) 300 MG capsule TAKE 1 TO 2 CAPSULES THREE TIMES DAILY (Patient taking differently: 600 mg 3 (three) times daily.) 540 capsule 1  . insulin NPH-regular Human (NOVOLIN 70/30 RELION) (70-30) 100 UNIT/ML injection Inject 50 Units into the skin 2 (two) times daily with a meal. 10 mL 11  . lactulose (CHRONULAC) 10 GM/15ML solution TAKE 15-30 MLS (10-20 G TOTAL) TWO TIMES DAILY AS NEEDED FOR MILD CONSTIPATION OR MODERATE CONSTIPATION. 236 mL 1  . Lancet Devices (ONE TOUCH DELICA LANCING DEV) MISC Please use to check blood sugar up to 4 times daily. E11.42 1 each 0  . Lancets Misc. (ACCU-CHEK SOFTCLIX LANCET DEV) KIT Use as instructed to check blood sugar 4 times daily. Dx E11.42 1 kit  prn  . lisinopril (ZESTRIL) 5 MG tablet Take 2 tablets (10 mg total) by mouth daily. (Patient taking differently: Take 10 mg by mouth 2 (two) times daily. 1 tablet by mouth BID) 90 tablet 1  . LORazepam (ATIVAN) 0.5 MG tablet Take 1 tablet (0.5 mg total) by mouth daily as needed for anxiety. 90 tablet 0  . magnesium oxide (MAG-OX) 400 (241.3 Mg) MG tablet Take 1 tablet (400 mg total) by mouth 2 (two) times daily. 30 tablet 0  . metFORMIN (GLUCOPHAGE-XR) 750 MG 24 hr tablet Take 1 tablet (750 mg total) by mouth 2 (two) times daily with a meal. 180 tablet 3  . Multiple Vitamin (MULTIVITAMIN PO) Take 1 tablet by mouth daily.    Glory Rosebush Delica Lancets 60F MISC Please use to check blood sugar up to 4 times daily. E11.42 100 each 12  . pantoprazole (PROTONIX) 20 MG tablet Take 2 tablets (40 mg total) by mouth daily.    . potassium chloride SA (KLOR-CON) 20 MEQ tablet Take 1 tablet (20 mEq total) by mouth 2 (two) times daily. 180 tablet 1  . Semaglutide,0.25 or 0.5MG /DOS, (OZEMPIC, 0.25 OR 0.5 MG/DOSE,) 2 MG/1.5ML SOPN Inject 0.5 mg into the skin once a week. (Patient taking differently: Inject 0.25 mg into the skin once a week.) 1.5 mL 3  . dapagliflozin propanediol (FARXIGA) 5 MG TABS tablet Take 2 tablets (10 mg total) by mouth daily. (Patient not taking: Reported  on 03/14/2021) 90 tablet 3   No current facility-administered medications for this visit.     Past Medical History:  Diagnosis Date  . Allergic rhinitis 09/26/2018  . Anxiety   . Arthritis   . Asthma   . Brain aneurysm 07/18/2018  . C. difficile colitis 07/18/2018  . Cerebral aneurysm without rupture   . Cerebral aneurysm without rupture   . Chest pain 03/26/2020  . Chronic kidney disease   . CKD stage 3 due to type 2 diabetes mellitus ([place]) 04/15/2019  . Depression   . Diabetes mellitus without complication ([place])   . Fibromyalgia   . Former smoker   . GERD (gastroesophageal reflux disease)   . Heart murmur   . History of chicken  pox   . History of colon polyps   . Hypertension   . IBS (irritable bowel syndrome)   . Legally blind   . Migraines   . Myalgia 04/23/2019  . Type 2 diabetes mellitus with hyperglycemia, with long-term current use of insulin ([place]) 12/03/2019    Past Surgical History:  Procedure Laterality Date  . ABDOMINAL HYSTERECTOMY  2012  . APPENDECTOMY  1972  . CEREBRAL ANEURYSM REPAIR  2004   x 2  . CHOLECYSTECTOMY  2015  . MENISCUS REPAIR Bilateral     Social History   Socioeconomic History  . Marital status: Legally Separated    Spouse name: [name]  . Number of children: 3  . Years of education: Not on file  . Highest education level: Associate degree: academic program  Occupational History  . Occupation: disabled  Tobacco Use  . Smoking status: Former Smoker    Packs/day: 1.00    Years: 3.00    Pack years: 3.00    Types: Cigarettes    Quit date: 09/04/1989    Years since quitting: 31.5  . Smokeless tobacco: Never Used  Vaping Use  . Vaping Use: Never used  Substance and Sexual Activity  . Alcohol use: Never  . Drug use: Never  . Sexual activity: Not Currently  Other Topics Concern  . Not on file  Social History Narrative   Patient is right-handed. She lives with her husband in a 2 story house. She walks the dog daily for exercise. She drinks I cup of coffee and 3 glasses of unsweet tea.   Social Determinants of Health   Financial Resource Strain: Not on file  Food Insecurity: Not on file  Transportation Needs: Not on file  Physical Activity: Not on file  Stress: Not on file  Social Connections: Not on file  Intimate Partner Violence: Not on file    Family History  Problem Relation Age of Onset  . Alcohol abuse Mother   . Diabetes Mother   . Hypertension Mother   . Kidney disease Mother   . COPD Father   . Diabetes Father   . Early death Father   . Heart disease Father   . Hyperlipidemia Father   . Hypertension Father   . Diabetes Sister   . Hypertension  Sister   . Breast cancer Sister   . Cancer Maternal Grandmother   . Alcohol abuse Maternal Grandfather   . Cancer Maternal Grandfather   . Cancer Paternal Grandmother   . Cancer Paternal Grandfather   . Diabetes Sister   . Hypertension Sister   . Kidney disease Sister     ROS: Anxiety but no fevers or chills, productive cough, hemoptysis, dysphasia, odynophagia, melena, hematochezia, dysuria, hematuria, rash, seizure activity, orthopnea,  PND, pedal edema, claudication. Remaining systems are negative.  Physical Exam: Well-developed well-nourished in no acute distress.  Skin is warm and dry.  HEENT is normal.  Neck is supple.  Chest is clear to auscultation with normal expansion.  Cardiovascular exam is regular rate and rhythm.  Abdominal exam nontender or distended. No masses palpated. Extremities show no edema. neuro grossly intact  ECG-normal sinus rhythm, normal axis, no ST changes.  Personally reviewed  A/P  1 chest pain-symptoms are chronic and atypical.  Previous CTA showed no coronary disease.  Electrocardiogram shows no new ST changes.  We will not pursue further evaluation at this point.  2 palpitations-no recent symptoms.  3 hypertension-blood pressure elevated; change lisinopril to 20 mg daily.  Follow blood pressure and adjust regimen as needed.  Check potassium and renal function in 1 week.  4 question history of mitral valve prolapse-not evident on most recent echocardiogram.  5 hyperlipidemia-continue statin.  Check lipids and liver.  Kirk Ruths, MD

## 2021-03-08 ENCOUNTER — Encounter: Payer: Self-pay | Admitting: Family Medicine

## 2021-03-08 ENCOUNTER — Telehealth: Payer: Self-pay | Admitting: Family Medicine

## 2021-03-08 ENCOUNTER — Other Ambulatory Visit: Payer: Self-pay | Admitting: Family Medicine

## 2021-03-08 DIAGNOSIS — N183 Chronic kidney disease, stage 3 unspecified: Secondary | ICD-10-CM

## 2021-03-08 DIAGNOSIS — F419 Anxiety disorder, unspecified: Secondary | ICD-10-CM

## 2021-03-08 DIAGNOSIS — F32A Depression, unspecified: Secondary | ICD-10-CM

## 2021-03-08 DIAGNOSIS — E1142 Type 2 diabetes mellitus with diabetic polyneuropathy: Secondary | ICD-10-CM

## 2021-03-08 DIAGNOSIS — E1122 Type 2 diabetes mellitus with diabetic chronic kidney disease: Secondary | ICD-10-CM

## 2021-03-08 DIAGNOSIS — E1169 Type 2 diabetes mellitus with other specified complication: Secondary | ICD-10-CM

## 2021-03-08 DIAGNOSIS — M797 Fibromyalgia: Secondary | ICD-10-CM

## 2021-03-08 DIAGNOSIS — Z794 Long term (current) use of insulin: Secondary | ICD-10-CM

## 2021-03-08 MED ORDER — METFORMIN HCL ER 750 MG PO TB24: 750.0000 mg | ORAL_TABLET | Freq: Two times a day (BID) | ORAL | 3 refills | Status: DC

## 2021-03-08 MED ORDER — FUROSEMIDE 40 MG PO TABS: 40.0000 mg | ORAL_TABLET | Freq: Every day | ORAL | 3 refills | Status: DC

## 2021-03-08 MED ORDER — ATORVASTATIN CALCIUM 40 MG PO TABS: 1.0000 | ORAL_TABLET | Freq: Every day | ORAL | 1 refills | Status: DC

## 2021-03-08 MED ORDER — DULOXETINE HCL 20 MG PO CSDR: 10.0000 mg | DELAYED_RELEASE_CAPSULE | Freq: Every day | ORAL | 0 refills | Status: DC

## 2021-03-08 NOTE — Progress Notes (Signed)
Just spoke with [name]. Apparently duloxetine only comes in capsules, it is not available in tablets. Since the lowest dose capsule is 20 mg (what [name] is having side effects on after 1 week), I will instruct her to take one 20 mg capsule every other day for four days in order to taper off.   She has not taken one today, so will take one capsule 4/21 then 4/23 and be done after that.   Will send MyChart message with these instructions.   Fayette Pho, MD

## 2021-03-08 NOTE — Telephone Encounter (Signed)
Called patient to discuss symptoms reported in MyChart message. She has been feeling "crummy", with nausea, dizziness. Feels as though she is stumbling. Started taking the duloxetine one week ago, started at reduced dose of 20 mg capsule. Has been experiencing these symptoms for the past 3 days. Reports being in bed the last 3 days because of this.   Consulted [place] pharmacist about need to taper given she has only been on for one week. Still advised taper, 4 days should be adequate.   Discussed plan with patient. Will recommend 10 mg daily (1/2 tablet) for four days.  Fayette Pho, MD

## 2021-03-13 ENCOUNTER — Other Ambulatory Visit: Payer: Self-pay | Admitting: Family Medicine

## 2021-03-13 DIAGNOSIS — I10 Essential (primary) hypertension: Secondary | ICD-10-CM

## 2021-03-14 ENCOUNTER — Ambulatory Visit (INDEPENDENT_AMBULATORY_CARE_PROVIDER_SITE_OTHER): Payer: Medicare (Managed Care) | Admitting: Cardiology

## 2021-03-14 ENCOUNTER — Telehealth: Payer: Self-pay | Admitting: Licensed Clinical Social Worker

## 2021-03-14 ENCOUNTER — Encounter: Payer: Self-pay | Admitting: Cardiology

## 2021-03-14 ENCOUNTER — Other Ambulatory Visit: Payer: Self-pay | Admitting: Family Medicine

## 2021-03-14 ENCOUNTER — Other Ambulatory Visit: Payer: Self-pay

## 2021-03-14 VITALS — BP 148/80 | HR 80 | Ht 62.0 in | Wt 176.0 lb

## 2021-03-14 DIAGNOSIS — R002 Palpitations: Secondary | ICD-10-CM

## 2021-03-14 DIAGNOSIS — E1159 Type 2 diabetes mellitus with other circulatory complications: Secondary | ICD-10-CM

## 2021-03-14 DIAGNOSIS — R072 Precordial pain: Secondary | ICD-10-CM

## 2021-03-14 DIAGNOSIS — I10 Essential (primary) hypertension: Secondary | ICD-10-CM | POA: Diagnosis not present

## 2021-03-14 DIAGNOSIS — E1122 Type 2 diabetes mellitus with diabetic chronic kidney disease: Secondary | ICD-10-CM | POA: Diagnosis not present

## 2021-03-14 DIAGNOSIS — I152 Hypertension secondary to endocrine disorders: Secondary | ICD-10-CM

## 2021-03-14 DIAGNOSIS — N183 Chronic kidney disease, stage 3 unspecified: Secondary | ICD-10-CM

## 2021-03-14 DIAGNOSIS — E7800 Pure hypercholesterolemia, unspecified: Secondary | ICD-10-CM

## 2021-03-14 MED ORDER — LISINOPRIL 20 MG PO TABS: 20.0000 mg | ORAL_TABLET | Freq: Every day | ORAL | 3 refills | Status: DC

## 2021-03-14 NOTE — Progress Notes (Signed)
Heart and Vascular Care Navigation  03/14/2021  CURTIS URIARTE 18-Nov-1956 [id number]  Reason for Referral:  Engaged with patient by telephone for initial visit for Heart and Vascular Care Coordination.                                                                                                   Assessment:           LCSW received referral for pt as she was very tearful while in the office, unfortunately I was unable to meet with her in person as I was engaged with another patient. Called and was able to reach her via telephone after her visit at [phone]. Introduced self, role, reason for call. Pt confirmed home address, PCP, and changed her emergency contact to her daughter Nash Dimmer.   She lives alone w/ her dog and cat. She shares with me that her husband of 35 years decided to leave their marriage and this has added considerable emotional stress onto her. She has an extensive history of challenges w/ her pain management, she is legally blind and had previous surgeries for a brain tumor. She utilizes [employer] and [employer] rides as needed. Pt receives disability income and is able to make ends meet. She gets her medications through [facility], currently is able to afford/obtain all of them. Pt is able to afford and obtain food as well. LCSW encouraged her to let us know if this ever becomes a challenge.   She has three children (one in [place], one in [place] and one in [place] area). She shares that she keeps in touch with them all but shares how busy she feels they are. It has been difficulty since her husband left since when she goes to see her children oftentimes there is a larger group which includes her former spouse and his extended family. She shares that she feels very overwhelmed and cries many times during the day, she denies any current SI/HI.  She has had previous experience with therapy and is not opposed to seeking support  but is concerned about the cost of copays. LCSW shared that there are various community organizations that may be able to provide sliding scale/more affordable therapy options to her but I would require her reaching out with my assistance if needed. Pt open to this information, she would like it emailed instead of mailed.  I provided pt verbal support for the persistent stress that this situation has caused her and reminded her that we remain available should pt have additional questions or concerns and provided her my name and number.                      HRT/VAS Care Coordination    Patients Home Cardiology Office [facility]   Outpatient Care Team Social Worker   Social Worker Name: Octavio Graves, LCSW, [facility]   Living arrangements for the past 2 months Apartment   Lives with: Self   Patient Has Concern With Paying Medical Bills No  general concern expressed about ongoing medical  care needs   Does Patient Have Prescription Coverage? Yes   Patient Prescription Assistance Programs Other   Other Assistance Programs Medications will send pt information about goodrx, encouraged her if costs of particular medications become too much to look into [facility] costs   Home Assistive Devices/Equipment Cane (specify quad or straight); Eyeglasses  Quad cane      Social History:                                                                             SDOH Screenings   Alcohol Screen: Not on file  Depression (PHQ2-9): Medium Risk  . PHQ-2 Score: 15  Financial Resource Strain: Low Risk   . Difficulty of Paying Living Expenses: Not very hard  Food Insecurity: No Food Insecurity  . Worried About Programme researcher, broadcasting/film/video in the Last Year: Never true  . Ran Out of Food in the Last Year: Never true  Housing: Low Risk   . Last Housing Risk Score: 0  Physical Activity: Not on file  Social Connections: Not on file  Stress: Stress Concern Present  . Feeling of Stress : Very much   Tobacco Use: Medium Risk  . Smoking Tobacco Use: Former Smoker  . Smokeless Tobacco Use: Never Used  Transportation Needs: No Transportation Needs  . Lack of Transportation (Medical): No  . Lack of Transportation (Non-Medical): No    SDOH Interventions: Financial Resources:  Corporate treasurer Interventions: Intervention Not Indicated   Food Insecurity:  Food Insecurity Interventions: Intervention Not Indicated  Housing Insecurity:  Housing Interventions: Intervention Not Indicated  Transportation:   Transportation Interventions: Intervention Not Indicated (pt takes [employer] or Research scientist (medical))   Other Care Navigation Interventions:     Provided Pharmacy assistance resources Other- good rx  Patient expressed Mental Health concerns Yes, Referred to:  community wellness resources. Pt currently recieving medication treatment for mental health per PCP   Follow-up plan:   LCSW will send information, pt requests this via email to [email or web address]. Pt has also mailed pt my card and flyer for [facility] to keep on hand. LCSW remains available as needed. I will let her know when I have emailed it and will f/u with her next week again.

## 2021-03-14 NOTE — Patient Instructions (Signed)
Medication Instructions:   INCREASE LISINOPRIL TO 20 MG ONCE DAILY= 4 OF THE 5 MG TABLETS ONCE DAILY  *If you need a refill on your cardiac medications before your next appointment, please call your pharmacy*   Lab Work:  Your physician recommends that you return for lab work in: ONE WEEK=FASTING  If you have labs (blood work) drawn today and your tests are completely normal, you will receive your results only by: Marland Kitchen MyChart Message (if you have MyChart) OR . A paper copy in the mail If you have any lab test that is abnormal or we need to change your treatment, we will call you to review the results.   Follow-Up: At [facility], you and your health needs are our priority.  As part of our continuing mission to provide you with exceptional heart care, we have created designated Provider Care Teams.  These Care Teams include your primary Cardiologist (physician) and Advanced Practice Providers (APPs -  Physician Assistants and Nurse Practitioners) who all work together to provide you with the care you need, when you need it.  We recommend signing up for the patient portal called "MyChart".  Sign up information is provided on this After Visit Summary.  MyChart is used to connect with patients for Virtual Visits (Telemedicine).  Patients are able to view lab/test results, encounter notes, upcoming appointments, etc.  Non-urgent messages can be sent to your provider as well.   To learn more about what you can do with MyChart, go to [email or web address].    Your next appointment:   12 month(s)  The format for your next appointment:   In Person  Provider:   Olga Millers, MD

## 2021-03-15 DIAGNOSIS — Z1231 Encounter for screening mammogram for malignant neoplasm of breast: Secondary | ICD-10-CM

## 2021-03-16 ENCOUNTER — Other Ambulatory Visit: Payer: Self-pay

## 2021-03-16 ENCOUNTER — Encounter: Payer: Self-pay | Admitting: Pharmacist

## 2021-03-16 ENCOUNTER — Ambulatory Visit: Payer: Medicare (Managed Care) | Admitting: Pharmacist

## 2021-03-16 DIAGNOSIS — I10 Essential (primary) hypertension: Secondary | ICD-10-CM | POA: Diagnosis not present

## 2021-03-16 NOTE — Progress Notes (Addendum)
S:    03/16/21: Patient arrives visibly anxiously with no assistance. Presents to the clinic for hypertension evaluation, counseling, and management as well as trial of 24 hour blood pressure monitoring. Patient has no medication complaints at this time, but does indicate her blood sugar readings have been much improved by weight loss on Ozempic. Patient endorses understanding of medication list but displays hesitancy to starting/stopping medications. She indicated she understood the importance of the 24 hour blood pressure monitoring trial.  Patient was referred and last seen by Primary Care Provider on 03/14/21.   03/17/21: Patient returns to the clinic this morning for evaluation of 24 hour ambulatory blood pressure monitoring results. Patient indicate she had to remove the cuff around 09:00 PM due to discomfort. Patient indicates that she does have bouts of dizziness, and even uses a cane sometimes due to fear of falling.   Medication adherence is good.  Current BP Medications include:  Lisinopril 20 MG BID prescribed by cardiologist, Patient is currently taking lisinopril 5 MG BID and has not transitioned to the higher dose prescribed by her cardiologist this past week. Patient indicated she did not increase her dose in anticipation for this trial.    Dietary habits include: Patient has lost a lot of appetite since starting Ozempic. Eats half a piece of toast in the morning and a sandwich in the afternoon. Snacks on grapes. Does not usually eat a meal in the evening. Patient also indicates that she does not drink much fluid during the day, endorses mouth dryness.    ASCVD risk factors include: Obesity, HTN, DM   O:  Physical Exam Musculoskeletal:        General: No swelling.  Neurological:     Mental Status: She is alert.  Psychiatric:        Thought Content: Thought content normal.    Review of Systems  Constitutional: Positive for weight loss.  Psychiatric/Behavioral: The patient  is nervous/anxious.     Last 3 Office BP readings: BP Readings from Last 3 Encounters:  03/14/21 (!) 148/80  02/22/21 (!) 142/72  01/17/21 (!) 160/70    BMET    Component Value Date/Time   NA 140 12/09/2020 0957   K 4.6 12/09/2020 0957   CL 102 12/09/2020 0957   CO2 22 12/09/2020 0957   GLUCOSE 282 (H) 12/09/2020 0957   GLUCOSE 172 (H) 04/27/2020 0844   BUN 19 12/09/2020 0957   CREATININE 1.05 (H) 12/09/2020 0957   CREATININE 1.05 (H) 04/27/2020 0844   CALCIUM 9.9 12/09/2020 0957   GFRNONAA 57 (L) 12/09/2020 0957   GFRNONAA 57 (L) 04/27/2020 0844   GFRAA 65 12/09/2020 0957   GFRAA 66 04/27/2020 0844    Renal function: CrCl cannot be calculated (Patient's most recent lab result is older than the maximum 21 days allowed.).   A/P: 03/16/21: Hypertension longstanding currently uncontrolled on current medications. BP Goal = < 130/80 mmHg. Medication adherence appears good. Patient indicates her BP is elevated during exercise (walking dog), especially when the dog barks. She indicates she can tell her blood pressure rises when similar activities increase anxiety. Patient indicates she goes to sleep at 11:00 PM and wakes up in the morning at 04:00 AM. - Placed 24 hour ambulatory blood pressure cuff on patient and adjust belt - Counseled patient on proper use of device and explained blood pressure monitoring process - Provided activity monitoring guide to help correlate BP with activities of daily living  - Instructed patient to follow up  with Dr. Raymondo Band and team tomorrow morning to review results and discuss next steps   03/17/21: Patient removed Amb BP cuff at 9:00 PM due to arm pain.  Readings prior to that time appear useful.  ABPM Study Data: Arm Placement left arm   Overall Mean 24hr BP:   157/71 mmHg  HR: 72  Daytime Mean BP:  157/71 mmHg   HR: 72   Nighttime Mean BP: Unable to assess, patient removed cuff        Dipping Pattern: Unable to asssess, patient removed  cuff  Upon review, patient appears to have isolated systolic hypertension. Systolic readings markedly elevated ranging from 140s-170s while diastolic readings remains in the 60s-70s. Patient is currently taking lisinopril 5 MG BID and has not transitioned to the increased dose prescribed by her cardiologist. Patient endorses dizziness and unsteadiness, sometimes requiring a cane to feel safe ambulating. Patient also indicates she feels dehydrated and dry, attributing this to her use of furosemide.  - Reviewed 24 hour blood pressure monitoring results with patient  - Recommend continuing lisinopril 5 MG BID for now to avoid dizziness associated with low diastolic readings  - Counseled patient on limiting use of furosemide to avoid low volumes / dehydration induced dizziness. Patient will return to strategy used in the past to dose furosemide based on weight and perceived volume status.  - Discussed realistic blood pressure goals and risk vs. benefit of lowering diastolic further - Will provide results to cardiologist Dr. Jens Som for review as well as PCP Dr. Larita Fife  - Follow up with Dr. Larita Fife next week - reevaluate volume status/weight and dizziness at that time.  Results reviewed and written information provided.   Total time in face-to-face counseling 30 minutes.   F/U Clinic Visit tomorrow morning (03/17/21).  Patient seen with Dr. Madelon Lips, PharmD, Dr. Isaias Sakai, PharmD, Coralyn Helling, PharmD Student

## 2021-03-16 NOTE — Patient Instructions (Signed)
Blood Pressure Activity Diary Time Lying down/ Sleeping Walking/ Exercise Stressed/ Angry Headache/ Pain Dizzy  9 AM       10 AM       11 AM       12 PM       1 PM       2 PM       Time Lying down/ Sleeping Walking/ Exercise Stressed/ Angry Headache/ Pain Dizzy  3 PM       4 PM        5 PM       6 PM       7 PM       8 PM       Time Lying down/ Sleeping Walking/ Exercise Stressed/ Angry Headache/ Pain Dizzy  9 PM       10 PM       11 PM       12 AM       1 AM       2 AM       3 AM       Time Lying down/ Sleeping Walking/ Exercise Stressed/ Angry Headache/ Pain Dizzy  4 AM       5 AM       6 AM       7 AM       8 AM       9 AM       10 AM        Time you woke up: _________                  Time you went to sleep:__________   Come back tomorrow at 08:30 AM to have the monitor removed  Call the [facility] if you have any questions before then ([phone])  Wearing the Blood Pressure Monitor  The cuff will inflate every 20 minutes during the day and every 30 minutes while you sleep.  Your blood pressure readings will NOT display after cuff inflation  Fill out the blood pressure-activity diary during the day, especially during activities that may affect your reading -- such as exercise, stress, walking, taking your blood pressure medications  Important things to know:  Avoid taking the monitor off for the next 24 hours, unless it causes you discomfort or pain.  Do NOT get the monitor wet and do NOT dry to clean the monitor with any cleaning products.  Do NOT put the monitor on anyone else's arm.  When the cuff inflates, avoid excess movement. Let the cuffed arm hang loosely, slightly away from the body. Avoid flexing the muscles or moving the hand/fingers.  When you go to sleep, make sure that the hose is not kinked.  Remember to fill out the blood pressure activity diary.  If you experience severe pain or unusual pain (not associated with getting  your blood pressure checked), remove the monitor.  Troubleshooting:  Code  Troubleshooting   1  Check cuff position, tighten cuff   2, 3  Remain still during reading   4, 87  Check air hose connections and make sure cuff is tight   85, 89  Check hose connections and make tubing is not crimped   86  Push START/STOP to restart reading   88, 91  Retry by pushing START/STOP   90  Replace batteries. If problem persists, remove monitor and bring back to   clinic at follow up   97, 98,  99  Service required - Remove monitor and bring back to clinic at follow up

## 2021-03-16 NOTE — Assessment & Plan Note (Signed)
Amb BP monitor placed.

## 2021-03-20 ENCOUNTER — Telehealth: Payer: Self-pay | Admitting: Licensed Clinical Social Worker

## 2021-03-20 NOTE — Telephone Encounter (Signed)
LCSW sent mental health resources to pt e-mail [email or web address], as requested on 4/26. The following providers were sent to pt: Mental Health [place] free/sliding scale/insured clinics, [facility] referral line, and [employer] (which connects individuals to therapists willing to moderate/cap the cost of their services. LCSW will f/u with pt tomorrow to ensure resources received.   Octavio Graves, MSW, LCSW [facility] Heart/Vascular Care Navigation  [phone]

## 2021-03-20 NOTE — Progress Notes (Signed)
Reviewed: I agree with Dr. Koval's documentation and management. 

## 2021-03-21 ENCOUNTER — Telehealth: Payer: Self-pay | Admitting: Licensed Clinical Social Worker

## 2021-03-21 ENCOUNTER — Inpatient Hospital Stay: Admission: RE | Admit: 2021-03-21 | Payer: Medicare (Managed Care) | Source: Ambulatory Visit

## 2021-03-21 NOTE — Telephone Encounter (Signed)
Pt returned my email and expressed gratitude for the resources sent. She shares it has been a hard week and she is grateful to have options to f/u on. Encouraged her to let me know if there is anything else we could assist with/any additional support with these resources provided. LCSW will f/u again in the next week or two to see if any additional questions/concerns.   Octavio Graves, MSW, LCSW [facility] Heart/Vascular Care Navigation  [phone]

## 2021-03-23 ENCOUNTER — Other Ambulatory Visit: Payer: Self-pay | Admitting: Family Medicine

## 2021-03-23 ENCOUNTER — Encounter: Payer: Self-pay | Admitting: Family Medicine

## 2021-03-23 DIAGNOSIS — Z1231 Encounter for screening mammogram for malignant neoplasm of breast: Secondary | ICD-10-CM

## 2021-03-28 ENCOUNTER — Other Ambulatory Visit: Payer: Self-pay | Admitting: Family Medicine

## 2021-03-28 ENCOUNTER — Ambulatory Visit: Payer: Medicare (Managed Care)

## 2021-03-28 ENCOUNTER — Other Ambulatory Visit: Payer: Self-pay

## 2021-03-28 DIAGNOSIS — Z1231 Encounter for screening mammogram for malignant neoplasm of breast: Secondary | ICD-10-CM

## 2021-03-28 DIAGNOSIS — E1122 Type 2 diabetes mellitus with diabetic chronic kidney disease: Secondary | ICD-10-CM

## 2021-03-28 DIAGNOSIS — I152 Hypertension secondary to endocrine disorders: Secondary | ICD-10-CM

## 2021-03-28 MED ORDER — LISINOPRIL 5 MG PO TABS: 5.0000 mg | ORAL_TABLET | Freq: Two times a day (BID) | ORAL | 3 refills | Status: DC

## 2021-03-28 NOTE — Progress Notes (Signed)
Given ambulatory blood pressure readings from [name]'s visit with Dr. Raymondo Band, will continue lisinopril 5 mg BID to avoid hypotension and dizziness from low diastolic pressures.   New script for 5 mg lisinopril sent to pharmacy.   Fayette Pho, MD

## 2021-03-29 ENCOUNTER — Other Ambulatory Visit: Payer: Self-pay

## 2021-03-29 ENCOUNTER — Ambulatory Visit: Payer: Medicare (Managed Care) | Admitting: Family Medicine

## 2021-03-29 VITALS — BP 138/80 | HR 87 | Ht 62.0 in | Wt 173.0 lb

## 2021-03-29 DIAGNOSIS — M797 Fibromyalgia: Secondary | ICD-10-CM

## 2021-03-29 DIAGNOSIS — Z87891 Personal history of nicotine dependence: Secondary | ICD-10-CM | POA: Insufficient documentation

## 2021-03-29 DIAGNOSIS — F419 Anxiety disorder, unspecified: Secondary | ICD-10-CM | POA: Diagnosis not present

## 2021-03-29 DIAGNOSIS — K219 Gastro-esophageal reflux disease without esophagitis: Secondary | ICD-10-CM

## 2021-03-29 DIAGNOSIS — Z122 Encounter for screening for malignant neoplasm of respiratory organs: Secondary | ICD-10-CM

## 2021-03-29 DIAGNOSIS — Z1211 Encounter for screening for malignant neoplasm of colon: Secondary | ICD-10-CM | POA: Diagnosis not present

## 2021-03-29 DIAGNOSIS — F32A Depression, unspecified: Secondary | ICD-10-CM

## 2021-03-29 MED ORDER — DULOXETINE HCL 20 MG PO CPEP: 20.0000 mg | ORAL_CAPSULE | Freq: Every day | ORAL | 2 refills | Status: DC

## 2021-03-29 MED ORDER — FAMOTIDINE 20 MG PO TABS: 20.0000 mg | ORAL_TABLET | Freq: Two times a day (BID) | ORAL | 0 refills | Status: DC

## 2021-03-29 NOTE — Assessment & Plan Note (Signed)
>>  ASSESSMENT AND PLAN FOR FIBROMYALGIA WRITTEN ON 03/29/2021  3:37 PM BY Fayette Pho, MD  Will re-try duloxetine trial. Taper up from 20 mg daily prescribed. Follow up in one month, sooner if side effects. Return precautions given. Also recommended more physical activity, provided information on [employer] [facility] opening near her home.

## 2021-03-29 NOTE — Assessment & Plan Note (Signed)
Will re-try duloxetine trial. Taper up from 20 mg daily prescribed. Follow up in one month, sooner if side effects. Return precautions given. Also recommended more physical activity, provided information on [employer] [facility] opening near her home.

## 2021-03-29 NOTE — Assessment & Plan Note (Signed)
15-30 pack-year history. Smoked 1-2 PPD x 15 years. Stopped smoking 35 years ago. Would like lung screening. Ordered low dose lung CT screen today. Will verify with insurance and then call to schedule.

## 2021-03-29 NOTE — Patient Instructions (Signed)
It was wonderful to see you today. Thank you for allowing me to be a part of your care. Below is a short summary of what we discussed at your visit today:  Acid reflux - stop pantoprazole - take famotidine for two weeks as instructed - tums and maalox as needed  Anxiety Restart duloxetine using our initial taper schedule. I have sent extra prescription to your pharmacy in case you need it.  Take 1 capsule (20 mg total) by mouth daily. Week 1: 20 mg (1 capsule) by mouth daily  Week 2: 40 mg (2 capsule) by mouth daily  Week 3 and on: 60 mg (3 capsule) by mouth daily  Find a therapist you like. Remember, it is like speed dating - you have to sort through a bunch of lemons before finding someone you like.   Use psychologytoday.com to find someone who takes your insurance.   Lung cancer screening I have ordered a low dose lung cancer screening. Our office will confirm your insurance will cover this and then call you to schedule.   Please bring all of your medications to every appointment!  If you have any questions or concerns, please do not hesitate to contact us via phone or MyChart message.   Fayette Pho, MD

## 2021-03-29 NOTE — Progress Notes (Signed)
    SUBJECTIVE:   CHIEF COMPLAINT / HPI:   Chronic muskuloskeletal pain  anxiety - anxiety worse the last couple of week - worst a night - spirals out of control with GERD symptoms  Throat and chest pain - almost always at night - involves midline chest, throat, jaw - usually when she is laying down watching tv - has been coughing and hoarse in the morning - stops eating at 3pm - tums and alkaselzer no relief - maalox relieves sometimes - resolves if sleep upright - on pantoprazole x2-3 months  Smoking history - asks about CT screen of lungs - stopped smoking 35 years ago - used to smoke 1-2 PPD x 15 years = 15-30 pack-year history  PERTINENT  PMH / PSH: HTN, GERD, fibromyalsia, T2DM, CKD3  OBJECTIVE:   BP 138/80   Pulse 87   Ht 5\' 2"  (1.575 m)   Wt 173 lb (78.5 kg)   SpO2 97%   BMI 31.64 kg/m    PHQ-9:  Depression screen [facility] 2/9 03/29/2021 02/22/2021 01/17/2021  Decreased Interest 1 2 2   Down, Depressed, Hopeless 2 2 2   PHQ - 2 Score 3 4 4   Altered sleeping 2 2 2   Tired, decreased energy 2 2 2   Change in appetite 1 3 3   Feeling bad or failure about yourself  2 2 2   Trouble concentrating 1 2 2   Moving slowly or fidgety/restless 1 0 2  Suicidal thoughts 0 0 0  PHQ-9 Score 12 15 17   Difficult doing work/chores Somewhat difficult Extremely dIfficult -  Some recent data might be hidden     GAD-7:  GAD 7 : Generalized Anxiety Score 09/08/2020 08/11/2020 05/26/2020 02/04/2020  Nervous, Anxious, on Edge 3 3 3 3   Control/stop worrying 3 3 3 3   Worry too much - different things 3 3 3 3   Trouble relaxing 3 3 3 3   Restless 3 - 1 0  Easily annoyed or irritable 0 0 1 1  Afraid - awful might happen 3 3 1 3   Total GAD 7 Score 18 - 15 16  Anxiety Difficulty Very difficult - Somewhat difficult Somewhat difficult     Physical Exam General: Awake, alert, oriented Cardiovascular: Regular rate and rhythm, S1 and S2 present, no murmurs auscultated Respiratory: Lung fields  clear to auscultation bilaterally   ASSESSMENT/PLAN:   GERD (gastroesophageal reflux disease) Suspect GERD with night time midline burning resolved with maaloz and upright positioning. Also with morning cough and hoarseness.  - DC pantoprazole - trial 2-week course famotidine BID - return in one month  Fibromyalgia Will re-try duloxetine trial. Taper up from 20 mg daily prescribed. Follow up in one month, sooner if side effects. Return precautions given. Also recommended more physical activity, provided information on [employer] [facility] opening near her home.   Smoking history 15-30 pack-year history. Smoked 1-2 PPD x 15 years. Stopped smoking 35 years ago. Would like lung screening. Ordered low dose lung CT screen today. Will verify with insurance and then call to schedule.      , MD [facility]

## 2021-03-29 NOTE — Assessment & Plan Note (Signed)
Suspect GERD with night time midline burning resolved with maaloz and upright positioning. Also with morning cough and hoarseness.  - DC pantoprazole - trial 2-week course famotidine BID - return in one month

## 2021-03-30 ENCOUNTER — Encounter: Payer: Self-pay | Admitting: Family Medicine

## 2021-03-30 ENCOUNTER — Emergency Department (HOSPITAL_BASED_OUTPATIENT_CLINIC_OR_DEPARTMENT_OTHER): Payer: Medicare (Managed Care) | Admitting: Radiology

## 2021-03-30 ENCOUNTER — Emergency Department (HOSPITAL_BASED_OUTPATIENT_CLINIC_OR_DEPARTMENT_OTHER)
Admission: EM | Admit: 2021-03-30 | Discharge: 2021-03-31 | Disposition: A | Discharge status: Home / Self Care | Payer: Medicare (Managed Care) | Attending: Emergency Medicine | Admitting: Emergency Medicine

## 2021-03-30 ENCOUNTER — Other Ambulatory Visit: Payer: Self-pay

## 2021-03-30 ENCOUNTER — Encounter (HOSPITAL_BASED_OUTPATIENT_CLINIC_OR_DEPARTMENT_OTHER): Payer: Self-pay

## 2021-03-30 DIAGNOSIS — Z7984 Long term (current) use of oral hypoglycemic drugs: Secondary | ICD-10-CM | POA: Insufficient documentation

## 2021-03-30 DIAGNOSIS — E1122 Type 2 diabetes mellitus with diabetic chronic kidney disease: Secondary | ICD-10-CM | POA: Insufficient documentation

## 2021-03-30 DIAGNOSIS — R079 Chest pain, unspecified: Secondary | ICD-10-CM | POA: Insufficient documentation

## 2021-03-30 DIAGNOSIS — R1013 Epigastric pain: Secondary | ICD-10-CM | POA: Insufficient documentation

## 2021-03-30 DIAGNOSIS — R110 Nausea: Secondary | ICD-10-CM | POA: Insufficient documentation

## 2021-03-30 DIAGNOSIS — Z794 Long term (current) use of insulin: Secondary | ICD-10-CM | POA: Diagnosis not present

## 2021-03-30 DIAGNOSIS — J45909 Unspecified asthma, uncomplicated: Secondary | ICD-10-CM | POA: Diagnosis not present

## 2021-03-30 DIAGNOSIS — Z87891 Personal history of nicotine dependence: Secondary | ICD-10-CM | POA: Diagnosis not present

## 2021-03-30 DIAGNOSIS — N183 Chronic kidney disease, stage 3 unspecified: Secondary | ICD-10-CM | POA: Diagnosis not present

## 2021-03-30 DIAGNOSIS — I129 Hypertensive chronic kidney disease with stage 1 through stage 4 chronic kidney disease, or unspecified chronic kidney disease: Secondary | ICD-10-CM | POA: Insufficient documentation

## 2021-03-30 DIAGNOSIS — K219 Gastro-esophageal reflux disease without esophagitis: Secondary | ICD-10-CM | POA: Diagnosis not present

## 2021-03-30 DIAGNOSIS — Z79899 Other long term (current) drug therapy: Secondary | ICD-10-CM | POA: Insufficient documentation

## 2021-03-30 LAB — TROPONIN I (HIGH SENSITIVITY)
Troponin I (High Sensitivity): 5 ng/L (ref <18)
Troponin I (High Sensitivity): 8 ng/L (ref <18)

## 2021-03-30 LAB — CBC
HCT: 42.5 % (ref 36.0–46.0)
Hemoglobin: 14.3 g/dL (ref 12.0–15.0)
MCH: 28.2 pg (ref 26.0–34.0)
MCHC: 33.6 g/dL (ref 30.0–36.0)
MCV: 83.8 fL (ref 80.0–100.0)
Platelets: 171 10*3/uL (ref 150–400)
RBC: 5.07 MIL/uL (ref 3.87–5.11)
RDW: 13 % (ref 11.5–15.5)
WBC: 5.8 10*3/uL (ref 4.0–10.5)
nRBC: 0 % (ref 0.0–0.2)

## 2021-03-30 LAB — BASIC METABOLIC PANEL
Anion gap: 14 (ref 5–15)
BUN: 20 mg/dL (ref 8–23)
CO2: 21 mmol/L — ABNORMAL LOW (ref 22–32)
Calcium: 9.7 mg/dL (ref 8.9–10.3)
Chloride: 103 mmol/L (ref 98–111)
Creatinine, Ser: 0.99 mg/dL (ref 0.44–1.00)
GFR, Estimated: >60 mL/min (ref >60)
Glucose, Bld: 288 mg/dL — ABNORMAL HIGH (ref 70–99)
Potassium: 3.7 mmol/L (ref 3.5–5.1)
Sodium: 138 mmol/L (ref 135–145)

## 2021-03-30 LAB — HM MAMMOGRAPHY

## 2021-03-30 MED ORDER — SUCRALFATE 1 G PO TABS: 1.0000 g | ORAL_TABLET | Freq: Once | ORAL | Status: DC

## 2021-03-30 MED ORDER — SUCRALFATE 1 G PO TABS: 1.0000 g | ORAL_TABLET | Freq: Three times a day (TID) | ORAL | 0 refills | Status: DC

## 2021-03-30 MED ORDER — LIDOCAINE VISCOUS HCL 2 % MT SOLN
15.0000 mL | Freq: Once | OROMUCOSAL | Status: AC
  Administered 2021-03-30: 15 mL via ORAL
  Filled 2021-03-30: qty 15

## 2021-03-30 MED ORDER — SUCRALFATE 1 GM/10ML PO SUSP
1.0000 g | Freq: Once | ORAL | Status: AC
  Administered 2021-03-30: 1 g via ORAL
  Filled 2021-03-30: qty 10

## 2021-03-30 MED ORDER — ALUM & MAG HYDROXIDE-SIMETH 200-200-20 MG/5ML PO SUSP
30.0000 mL | Freq: Once | ORAL | Status: AC
  Administered 2021-03-30: 30 mL via ORAL
  Filled 2021-03-30: qty 30

## 2021-03-30 NOTE — ED Notes (Signed)
Pt is transported to  [facility]

## 2021-03-30 NOTE — ED Provider Notes (Incomplete)
[facility] EMERGENCY DEPT Provider Note   CSN: [id number] Arrival date & time: 03/30/21  2049     History Chief Complaint  Patient presents with  . Chest Pain    Cassandra Reid is a 65 y.o. female.  HPI     Past Medical History:  Diagnosis Date  . Allergic rhinitis 09/26/2018  . Anxiety   . Arthritis   . Asthma   . Brain aneurysm 07/18/2018  . C. difficile colitis 07/18/2018  . Cerebral aneurysm without rupture   . Cerebral aneurysm without rupture   . Chest pain 03/26/2020  . Chronic kidney disease   . CKD stage 3 due to type 2 diabetes mellitus (HCC) 04/15/2019  . Depression   . Diabetes mellitus without complication (HCC)   . Fibromyalgia   . Former smoker   . GERD (gastroesophageal reflux disease)   . Heart murmur   . History of chicken pox   . History of colon polyps   . Hypertension   . IBS (irritable bowel syndrome)   . Legally blind   . Migraines   . Myalgia 04/23/2019  . Type 2 diabetes mellitus with hyperglycemia, with long-term current use of insulin (HCC) 12/03/2019    Patient Active Problem List   Diagnosis Date Noted  . Smoking history 03/29/2021  . Osteoporosis 01/18/2021  . Foot pain, left 12/09/2020  . Fatigue 11/10/2020  . Fatty liver 05/31/2020  . Chronic pain syndrome 04/23/2019  . DDD (degenerative disc disease), cervical 04/16/2019  . CKD stage 3 due to type 2 diabetes mellitus (HCC) 04/15/2019  . Hypertension 07/18/2018  . GERD (gastroesophageal reflux disease) 07/18/2018  . OSA (obstructive sleep apnea) 07/18/2018  . Type 2 diabetes mellitus with diabetic polyneuropathy, with long-term current use of insulin (HCC) 07/18/2018  . Fibromyalgia 07/18/2018  . Anxiety and depression 07/18/2018  . Hypercholesterolemia 07/18/2018  . Legally blind 09/01/2003    Past Surgical History:  Procedure Laterality Date  . ABDOMINAL HYSTERECTOMY  2012  . APPENDECTOMY  1972  . CEREBRAL ANEURYSM REPAIR  2004   x 2  . CHOLECYSTECTOMY   2015  . MENISCUS REPAIR Bilateral      OB History   No obstetric history on file.     Family History  Problem Relation Age of Onset  . Alcohol abuse Mother   . Diabetes Mother   . Hypertension Mother   . Kidney disease Mother   . COPD Father   . Diabetes Father   . Early death Father   . Heart disease Father   . Hyperlipidemia Father   . Hypertension Father   . Diabetes Sister   . Hypertension Sister   . Breast cancer Sister   . Cancer Maternal Grandmother   . Alcohol abuse Maternal Grandfather   . Cancer Maternal Grandfather   . Cancer Paternal Grandmother   . Cancer Paternal Grandfather   . Diabetes Sister   . Hypertension Sister   . Kidney disease Sister     Social History   Tobacco Use  . Smoking status: Former Smoker    Packs/day: 1.00    Years: 3.00    Pack years: 3.00    Types: Cigarettes    Quit date: 09/04/1989    Years since quitting: 31.5  . Smokeless tobacco: Never Used  Vaping Use  . Vaping Use: Never used  Substance Use Topics  . Alcohol use: Never  . Drug use: Never    Home Medications Prior to Admission medications  Medication Sig Start Date End Date Taking? Authorizing Provider  Ascorbic Acid (VITAMIN C PO) Take 1 tablet by mouth daily.    [provider]  atorvastatin (LIPITOR) 40 MG tablet Take 1 tablet (40 mg total) by mouth at bedtime. 03/08/21   Ezequiel Essex, MD  BD PEN NEEDLE NANO 2ND GEN 32G X 4 MM MISC USE WITH INSULIN 02/21/21   Ezequiel Essex, MD  dapagliflozin propanediol (FARXIGA) 5 MG TABS tablet Take 2 tablets (10 mg total) by mouth daily. Patient not taking: No sig reported 02/28/21   Ezequiel Essex, MD  DULoxetine (CYMBALTA) 20 MG capsule Take 1 capsule (20 mg total) by mouth daily. Week 1: 20 mg (1 capsule) by mouth daily  Week 2: 40 mg (2 capsule) by mouth daily  Week 3 and on: 60 mg (3 capsule) by mouth daily 03/29/21 03/29/22  Ezequiel Essex, MD  famotidine (PEPCID) 20 MG tablet Take 1 tablet (20 mg total)  by mouth 2 (two) times daily for 15 days. 03/29/21 04/13/21  Ezequiel Essex, MD  fluticasone [facility]) 50 MCG/ACT nasal spray Place 1 spray into both nostrils daily as needed for allergies or rhinitis.    [provider]  furosemide (LASIX) 40 MG tablet Take 1 tablet (40 mg total) by mouth daily. 03/08/21   Ezequiel Essex, MD  gabapentin (NEURONTIN) 300 MG capsule TAKE 1 TO 2 CAPSULES THREE TIMES DAILY Patient taking differently: 600 mg 3 (three) times daily. 06/29/20   Emeterio Reeve, DO  insulin NPH-regular Human (NOVOLIN 70/30 RELION) (70-30) 100 UNIT/ML injection Inject 50 Units into the skin 2 (two) times daily with a meal. 01/09/21   Ezequiel Essex, MD  lactulose ([place]) 10 GM/15ML solution TAKE 15-30 MLS (10-20 G TOTAL) TWO TIMES DAILY AS NEEDED FOR MILD CONSTIPATION OR MODERATE CONSTIPATION. 10/24/20   Emeterio Reeve, DO  Lancet Devices (ONE TOUCH DELICA LANCING DEV) MISC Please use to check blood sugar up to 4 times daily. E11.42 01/31/21   Ezequiel Essex, MD  Lancets Misc. (ACCU-CHEK SOFTCLIX LANCET DEV) KIT Use as instructed to check blood sugar 4 times daily. Dx I33.82 01/18/21   Ezequiel Essex, MD  lisinopril (ZESTRIL) 5 MG tablet Take 1 tablet (5 mg total) by mouth 2 (two) times daily. 03/28/21   Ezequiel Essex, MD  LORazepam (ATIVAN) 0.5 MG tablet Take 1 tablet (0.5 mg total) by mouth daily as needed for anxiety. 01/09/21   Ezequiel Essex, MD  magnesium oxide (MAG-OX) 400 (241.3 Mg) MG tablet Take 1 tablet (400 mg total) by mouth 2 (two) times daily. 03/27/20   Regalado, [name], MD  metFORMIN (GLUCOPHAGE-XR) 750 MG 24 hr tablet Take 1 tablet (750 mg total) by mouth 2 (two) times daily with a meal. 03/08/21   Ezequiel Essex, MD  Multiple Vitamin (MULTIVITAMIN PO) Take 1 tablet by mouth daily.    [provider]  OneTouch Delica Lancets 50N MISC Please use to check blood sugar up to 4 times daily. E11.42 01/31/21   Ezequiel Essex, MD  potassium chloride SA  (KLOR-CON) 20 MEQ tablet Take 1 tablet (20 mEq total) by mouth 2 (two) times daily. 01/18/21   Ezequiel Essex, MD  Semaglutide,0.25 or 0.5MG /DOS, (OZEMPIC, 0.25 OR 0.5 MG/DOSE,) 2 MG/1.5ML SOPN Inject 0.5 mg into the skin once a week. Patient taking differently: Inject 0.25 mg into the skin once a week. 12/09/20   Ezequiel Essex, MD    Allergies    Patient has no known allergies.  Review of Systems   Review of  Systems  Physical Exam Updated Vital Signs BP (!) 177/67 (BP Location: Right Arm)   Pulse 81   Temp 98.4 F (36.9 C)   Resp 20   Ht $R'5\' 2"'pu$  (1.575 m)   Wt 78 kg   SpO2 99%   BMI 31.46 kg/m   Physical Exam  ED Results / Procedures / Treatments   Labs (all labs ordered are listed, but only abnormal results are displayed) Labs Reviewed  BASIC METABOLIC PANEL - Abnormal; Notable for the following components:      Result Value   CO2 21 (*)    Glucose, Bld 288 (*)    All other components within normal limits  CBC  TROPONIN I (HIGH SENSITIVITY)    EKG EKG Interpretation  Date/Time:  Thursday Mar 30 2021 21:12:19 EDT Ventricular Rate:  100 PR Interval:  178 QRS Duration: 74 QT Interval:  354 QTC Calculation: 456 R Axis:   76 Text Interpretation: Normal sinus rhythm Cannot rule out Anterior infarct , age undetermined Abnormal ECG when compared toprior, faster rate. No STEMI Confirmed by Antony Blackbird [phone]) on 03/30/2021 9:31:35 PM   Radiology DG Chest 2 View  Result Date: 03/30/2021 CLINICAL DATA:  Chest pain for several months EXAM: CHEST - 2 VIEW COMPARISON:  07/29/2020 FINDINGS: The heart size and mediastinal contours are within normal limits. Both lungs are clear. The visualized skeletal structures are unremarkable. IMPRESSION: No active cardiopulmonary disease. Electronically Signed   By: Inez Catalina M.D.   On: 03/30/2021 21:31    Procedures Procedures {Remember to document critical care time when appropriate:1}  Medications Ordered in ED Medications  - No data to display  ED Course  I have reviewed the triage vital signs and the nursing notes.  Pertinent labs & imaging results that were available during my care of the patient were reviewed by me and considered in my medical decision making (see chart for details).    MDM Rules/Calculators/A&P                          PENNY ARRAMBIDE is a 65 y.o. female      Final Clinical Impression(s) / ED Diagnoses Final diagnoses:  None    Rx / DC Orders ED Discharge Orders    None

## 2021-03-30 NOTE — ED Notes (Signed)
Pt is back to room  

## 2021-03-30 NOTE — ED Triage Notes (Signed)
Patient here POV from Home with CP and Anxiety.   Patient has been experiencing CP infrequently for a few months now but they have been more frequent and severe over the past few days.  Pain has been radiating to Upper Chest and Jaw, as well as Bilateral Rib Cage.  No Recent Trauma, GCS 15, Patient Tearful during Triage.

## 2021-03-30 NOTE — ED Provider Notes (Signed)
[facility] EMERGENCY DEPT Provider Note   CSN: [id number] Arrival date & time: 03/30/21  2049     History Chief Complaint  Patient presents with  . Chest Pain    Cassandra Reid is a 65 y.o. female.  The history is provided by the patient and medical records. No language interpreter was used.  Abdominal Pain Pain location:  Epigastric Pain quality: aching, burning and dull   Pain radiates to:  Chest Pain severity:  Severe Onset quality:  Gradual Timing:  Constant Progression:  Waxing and waning Chronicity:  Recurrent Relieved by:  Nothing Worsened by:  Nothing Ineffective treatments:  None tried Associated symptoms: chest pain and nausea   Associated symptoms: no chills, no constipation, no cough, no diarrhea, no dysuria, no fatigue, no fever, no shortness of breath and no vomiting        Past Medical History:  Diagnosis Date  . Allergic rhinitis 09/26/2018  . Anxiety   . Arthritis   . Asthma   . Brain aneurysm 07/18/2018  . C. difficile colitis 07/18/2018  . Cerebral aneurysm without rupture   . Cerebral aneurysm without rupture   . Chest pain 03/26/2020  . Chronic kidney disease   . CKD stage 3 due to type 2 diabetes mellitus ([place]) 04/15/2019  . Depression   . Diabetes mellitus without complication ([place])   . Fibromyalgia   . Former smoker   . GERD (gastroesophageal reflux disease)   . Heart murmur   . History of chicken pox   . History of colon polyps   . Hypertension   . IBS (irritable bowel syndrome)   . Legally blind   . Migraines   . Myalgia 04/23/2019  . Type 2 diabetes mellitus with hyperglycemia, with long-term current use of insulin ([place]) 12/03/2019    Patient Active Problem List   Diagnosis Date Noted  . Smoking history 03/29/2021  . Osteoporosis 01/18/2021  . Foot pain, left 12/09/2020  . Fatigue 11/10/2020  . Fatty liver 05/31/2020  . Chronic pain syndrome 04/23/2019  . DDD (degenerative disc disease), cervical 04/16/2019  .  CKD stage 3 due to type 2 diabetes mellitus ([place]) 04/15/2019  . Hypertension 07/18/2018  . GERD (gastroesophageal reflux disease) 07/18/2018  . OSA (obstructive sleep apnea) 07/18/2018  . Type 2 diabetes mellitus with diabetic polyneuropathy, with long-term current use of insulin ([place]) 07/18/2018  . Fibromyalgia 07/18/2018  . Anxiety and depression 07/18/2018  . Hypercholesterolemia 07/18/2018  . Legally blind 09/01/2003    Past Surgical History:  Procedure Laterality Date  . ABDOMINAL HYSTERECTOMY  2012  . APPENDECTOMY  1972  . CEREBRAL ANEURYSM REPAIR  2004   x 2  . CHOLECYSTECTOMY  2015  . MENISCUS REPAIR Bilateral      OB History   No obstetric history on file.     Family History  Problem Relation Age of Onset  . Alcohol abuse Mother   . Diabetes Mother   . Hypertension Mother   . Kidney disease Mother   . COPD Father   . Diabetes Father   . Early death Father   . Heart disease Father   . Hyperlipidemia Father   . Hypertension Father   . Diabetes Sister   . Hypertension Sister   . Breast cancer Sister   . Cancer Maternal Grandmother   . Alcohol abuse Maternal Grandfather   . Cancer Maternal Grandfather   . Cancer Paternal Grandmother   . Cancer Paternal Grandfather   . Diabetes Sister   .  Hypertension Sister   . Kidney disease Sister     Social History   Tobacco Use  . Smoking status: Former Smoker    Packs/day: 1.00    Years: 3.00    Pack years: 3.00    Types: Cigarettes    Quit date: 09/04/1989    Years since quitting: 31.5  . Smokeless tobacco: Never Used  Vaping Use  . Vaping Use: Never used  Substance Use Topics  . Alcohol use: Never  . Drug use: Never    Home Medications Prior to Admission medications   Medication Sig Start Date End Date Taking? Authorizing Provider  Ascorbic Acid (VITAMIN C PO) Take 1 tablet by mouth daily.    [provider]  atorvastatin (LIPITOR) 40 MG tablet Take 1 tablet (40 mg total) by mouth at  bedtime. 03/08/21   Ezequiel Essex, MD  BD PEN NEEDLE NANO 2ND GEN 32G X 4 MM MISC USE WITH INSULIN 02/21/21   Ezequiel Essex, MD  dapagliflozin propanediol (FARXIGA) 5 MG TABS tablet Take 2 tablets (10 mg total) by mouth daily. Patient not taking: No sig reported 02/28/21   Ezequiel Essex, MD  DULoxetine (CYMBALTA) 20 MG capsule Take 1 capsule (20 mg total) by mouth daily. Week 1: 20 mg (1 capsule) by mouth daily  Week 2: 40 mg (2 capsule) by mouth daily  Week 3 and on: 60 mg (3 capsule) by mouth daily 03/29/21 03/29/22  Ezequiel Essex, MD  famotidine (PEPCID) 20 MG tablet Take 1 tablet (20 mg total) by mouth 2 (two) times daily for 15 days. 03/29/21 04/13/21  Ezequiel Essex, MD  fluticasone [facility]) 50 MCG/ACT nasal spray Place 1 spray into both nostrils daily as needed for allergies or rhinitis.    [provider]  furosemide (LASIX) 40 MG tablet Take 1 tablet (40 mg total) by mouth daily. 03/08/21   Ezequiel Essex, MD  gabapentin (NEURONTIN) 300 MG capsule TAKE 1 TO 2 CAPSULES THREE TIMES DAILY Patient taking differently: 600 mg 3 (three) times daily. 06/29/20   Emeterio Reeve, DO  insulin NPH-regular Human (NOVOLIN 70/30 RELION) (70-30) 100 UNIT/ML injection Inject 50 Units into the skin 2 (two) times daily with a meal. 01/09/21   Ezequiel Essex, MD  lactulose ([place]) 10 GM/15ML solution TAKE 15-30 MLS (10-20 G TOTAL) TWO TIMES DAILY AS NEEDED FOR MILD CONSTIPATION OR MODERATE CONSTIPATION. 10/24/20   Emeterio Reeve, DO  Lancet Devices (ONE TOUCH DELICA LANCING DEV) MISC Please use to check blood sugar up to 4 times daily. E11.42 01/31/21   Ezequiel Essex, MD  Lancets Misc. (ACCU-CHEK SOFTCLIX LANCET DEV) KIT Use as instructed to check blood sugar 4 times daily. Dx D32.20 01/18/21   Ezequiel Essex, MD  lisinopril (ZESTRIL) 5 MG tablet Take 1 tablet (5 mg total) by mouth 2 (two) times daily. 03/28/21   Ezequiel Essex, MD  LORazepam (ATIVAN) 0.5 MG tablet Take 1 tablet (0.5 mg  total) by mouth daily as needed for anxiety. 01/09/21   Ezequiel Essex, MD  magnesium oxide (MAG-OX) 400 (241.3 Mg) MG tablet Take 1 tablet (400 mg total) by mouth 2 (two) times daily. 03/27/20   Regalado, [name], MD  metFORMIN (GLUCOPHAGE-XR) 750 MG 24 hr tablet Take 1 tablet (750 mg total) by mouth 2 (two) times daily with a meal. 03/08/21   Ezequiel Essex, MD  Multiple Vitamin (MULTIVITAMIN PO) Take 1 tablet by mouth daily.    [provider]  OneTouch Delica Lancets 25K MISC Please use to check blood  sugar up to 4 times daily. E11.42 01/31/21   Ezequiel Essex, MD  potassium chloride SA (KLOR-CON) 20 MEQ tablet Take 1 tablet (20 mEq total) by mouth 2 (two) times daily. 01/18/21   Ezequiel Essex, MD  Semaglutide,0.25 or 0.5MG /DOS, (OZEMPIC, 0.25 OR 0.5 MG/DOSE,) 2 MG/1.5ML SOPN Inject 0.5 mg into the skin once a week. Patient taking differently: Inject 0.25 mg into the skin once a week. 12/09/20   Ezequiel Essex, MD    Allergies    Patient has no known allergies.  Review of Systems   Review of Systems  Constitutional: Negative for chills, diaphoresis, fatigue and fever.  HENT: Negative for congestion.   Respiratory: Negative for cough, chest tightness and shortness of breath.   Cardiovascular: Positive for chest pain. Negative for palpitations and leg swelling.  Gastrointestinal: Positive for abdominal pain and nausea. Negative for constipation, diarrhea and vomiting.  Genitourinary: Negative for dysuria, flank pain and frequency.  Musculoskeletal: Negative for back pain.  Neurological: Negative for headaches.  Psychiatric/Behavioral: Positive for agitation. Negative for confusion. The patient is nervous/anxious.   All other systems reviewed and are negative.   Physical Exam Updated Vital Signs BP (!) 177/67 (BP Location: Right Arm)   Pulse 81   Temp 98.4 F (36.9 C)   Resp 20   Ht 5\' 2"  (1.575 m)   Wt 78 kg   SpO2 99%   BMI 31.46 kg/m   Physical Exam Vitals and  nursing note reviewed.  Constitutional:      General: She is not in acute distress.    Appearance: She is well-developed. She is not ill-appearing, toxic-appearing or diaphoretic.  HENT:     Head: Normocephalic and atraumatic.  Eyes:     Conjunctiva/sclera: Conjunctivae normal.     Pupils: Pupils are equal, round, and reactive to light.  Cardiovascular:     Rate and Rhythm: Normal rate and regular rhythm.     Heart sounds: Normal heart sounds. No murmur heard.   Pulmonary:     Effort: Pulmonary effort is normal. No tachypnea or respiratory distress.     Breath sounds: Normal breath sounds. No decreased breath sounds, wheezing or rhonchi.  Abdominal:     Palpations: Abdomen is soft.     Tenderness: There is no abdominal tenderness. There is no guarding or rebound.  Musculoskeletal:     Cervical back: Neck supple.     Right lower leg: No tenderness. No edema.     Left lower leg: No tenderness. No edema.  Skin:    General: Skin is warm and dry.     Capillary Refill: Capillary refill takes less than 2 seconds.  Neurological:     General: No focal deficit present.     Mental Status: She is alert.  Psychiatric:        Mood and Affect: Mood is anxious.     ED Results / Procedures / Treatments   Labs (all labs ordered are listed, but only abnormal results are displayed) Labs Reviewed  BASIC METABOLIC PANEL - Abnormal; Notable for the following components:      Result Value   CO2 21 (*)    Glucose, Bld 288 (*)    All other components within normal limits  CBC  TROPONIN I (HIGH SENSITIVITY)    EKG EKG Interpretation  Date/Time:  Thursday Mar 30 2021 21:12:19 EDT Ventricular Rate:  100 PR Interval:  178 QRS Duration: 74 QT Interval:  354 QTC Calculation: 456 R Axis:   76 Text  Interpretation: Normal sinus rhythm Cannot rule out Anterior infarct , age undetermined Abnormal ECG when compared toprior, faster rate. No STEMI Confirmed by Antony Blackbird [phone]) on 03/30/2021  9:31:35 PM   Radiology DG Chest 2 View  Result Date: 03/30/2021 CLINICAL DATA:  Chest pain for several months EXAM: CHEST - 2 VIEW COMPARISON:  07/29/2020 FINDINGS: The heart size and mediastinal contours are within normal limits. Both lungs are clear. The visualized skeletal structures are unremarkable. IMPRESSION: No active cardiopulmonary disease. Electronically Signed   By: Inez Catalina M.D.   On: 03/30/2021 21:31    Procedures Procedures   Medications Ordered in ED Medications  sucralfate (CARAFATE) 1 GM/10ML suspension 1 g (1 g Oral Given 03/31/21 0021)  alum & mag hydroxide-simeth (MAALOX/MYLANTA) 200-200-20 MG/5ML suspension 30 mL (30 mLs Oral Given 03/30/21 2244)    And  lidocaine (XYLOCAINE) 2 % viscous mouth solution 15 mL (15 mLs Oral Given 03/30/21 2244)  sucralfate (CARAFATE) 1 GM/10ML suspension 1 g (1 g Oral Given 03/30/21 2244)    ED Course  I have reviewed the triage vital signs and the nursing notes.  Pertinent labs & imaging results that were available during my care of the patient were reviewed by me and considered in my medical decision making (see chart for details).    MDM Rules/Calculators/A&P                          Cassandra Reid is a 65 y.o. female with a past medical history significant for diabetes, fibromyalgia, CKD, hypertension, and extensive GERD who presents with chest pain and abdominal discomfort and anxiety.  She reports that for the last few weeks, she has been having worsened episodes of burning discomfort in her upper abdomen and chest that is worse at night and when laying down.  She had recently had her GI regimen altered over the last several days but reports it is more severe.  She denies any trauma.  Denies significant shortness of breath or palpitations.  Denies  vomiting but does report some nausea.  No leg pain or leg swelling.  She reports being very anxious and anxiety in the setting of this discomfort.  On exam, lungs are clear and  chest is nontender.  Abdomen is tender in the epigastric area.  Good pulses in extremities.  Normal sensation and strength in the legs.  Patient is tearful but redirectable.  Clinically I suspect reflux is the cause of her discomfort however we will get delta troponin, chest x-ray, and labs.  We will give a GI cocktail and Carafate to help the patient's discomfort.  Patient reports after cocktail and Carafate symptoms went from a severe pain to a 2 out of 10 and feeling much better.  She had reassuring work-up otherwise.  Pain did not reportedly go from her chest to her back and despite her history of intracranial aneurysm, low suspicion for aortic aneurysm at this time.  Good pulses and sensation and strength in legs.  Chart review from patient's previous endoscopy shows she does have erosions and irritation in the stomach and duodenum, suspect this is because of her symptoms with it worsening.  She is already had a cholecystectomy and reports it feels different than her previous gallbladder pain  Patient is feeling much better and would like to follow-up with her gastroenterologist.  We will give amatory referral to gastroenterology and give her prescription for Carafate.  She understands to use her new medications  and return precautions were observed.  She had otherwise or concerns and was feeling better and was discharged in good condition.         Final Clinical Impression(s) / ED Diagnoses Final diagnoses:  Epigastric pain    Rx / DC Orders ED Discharge Orders         Ordered    Ambulatory referral to Gastroenterology        03/31/21 0001    sucralfate (CARAFATE) 1 g tablet  3 times daily with meals & bedtime        03/30/21 2359          Clinical Impression: 1. Epigastric pain     Disposition: Discharge  Condition: Good  I have discussed the results, Dx and Tx plan with the pt(& family if present). He/she/they expressed understanding and agree(s) with the plan. Discharge  instructions discussed at great length. Strict return precautions discussed and pt &/or family have verbalized understanding of the instructions. No further questions at time of discharge.    Discharge Medication List as of 03/31/2021 12:03 AM    START taking these medications   Details  sucralfate (CARAFATE) 1 g tablet Take 1 tablet (1 g total) by mouth 4 (four) times daily -  with meals and at bedtime., Starting Thu 03/30/2021, Print        Follow Up: [facility] [place] [id number] [phone]    Gastroenterology, [address] [place] [address] [phone]     [facility] [address] [place] [address] [phone]       Aurelie Dicenzo, Gwenyth Allegra, MD 03/31/21 [phone]

## 2021-03-31 MED ORDER — SUCRALFATE 1 GM/10ML PO SUSP
1.0000 g | Freq: Three times a day (TID) | ORAL | Status: DC
  Administered 2021-03-31: 1 g via ORAL

## 2021-03-31 MED ORDER — SUCRALFATE 1 GM/10ML PO SUSP
1.0000 g | Freq: Once | ORAL | Status: DC
  Filled 2021-03-31: qty 10

## 2021-03-31 NOTE — Discharge Instructions (Signed)
Your history, exam, and work-up today are consistent with a likely gastric or GI cause of your abdominal and chest discomfort.  Your work-up was overall reassuring otherwise.  As your symptoms improved with the medications, we feel you are safe for discharge home now.  Please take the new medication, Carafate, to help with the discomfort and continue your new GI regimen.  Please call to follow-up with the gastroenterology team soon as possible for reassessment.  If any symptoms change or worsen acutely, please return to the nearest emergency department.  Please try to rest and stay hydrated.

## 2021-03-31 NOTE — ED Notes (Signed)
This RN presented the AVS utilizing Teachback Method. Patient verbalizes understanding of Discharge Instructions. Opportunity for Questioning and Answers were provided. Patient Discharged from ED ambulatory to Home via [place].

## 2021-04-07 ENCOUNTER — Other Ambulatory Visit: Payer: Self-pay | Admitting: Family Medicine

## 2021-04-07 DIAGNOSIS — K219 Gastro-esophageal reflux disease without esophagitis: Secondary | ICD-10-CM

## 2021-04-10 ENCOUNTER — Telehealth: Payer: Self-pay | Admitting: *Deleted

## 2021-04-10 ENCOUNTER — Encounter: Payer: Self-pay | Admitting: Family Medicine

## 2021-04-10 NOTE — Telephone Encounter (Signed)
Received call from [facility] for patient stating that she needs a peer to peer for her CT chest low dose lung cancer screen.  I faxed over notes to them last week but they said that the clinical documentation provided didn't support the need for this image.  Will forward to MD to call them if she would like to do a peer to peer, if not then let me know and we will cancel the order. Burnard Hawthorne  [phone] Tracking# [id number]

## 2021-04-11 NOTE — Telephone Encounter (Signed)
Referral cancelled.  Rhonna Holster,CMA  

## 2021-04-13 ENCOUNTER — Encounter: Payer: Self-pay | Admitting: Family Medicine

## 2021-04-14 ENCOUNTER — Encounter: Payer: Self-pay | Admitting: Family Medicine

## 2021-04-14 ENCOUNTER — Telehealth: Payer: Self-pay | Admitting: Family Medicine

## 2021-04-14 NOTE — Telephone Encounter (Signed)
Called Cassandra Reid to discuss her MyChart message from earlier today. Apologized for the delay, as I am currently scheduled night shift all week on the pediatrics floor. She reported increased BP the last couple of weeks with systolics ranging 150s-190s

## 2021-04-14 NOTE — Telephone Encounter (Signed)
Called Ms. Mancebo to discuss her MyChart message from earlier today. Apologized for the delay, as I am currently scheduled night shift all week on the pediatrics floor. She reported increased BP the last couple of weeks with systolics ranging 150s-190s and diastolics in the 80s.  Has some morning headaches, but she believes these are improved with use of her CPAP machine.  No dizziness, blurry vision.  Asked her about med changes.  She cannot recall any changes in her medicines from any of her specialist.  I reminded her that I cannot see notes easily from her kidney doctor, but she reports no BP med changes from her nephrologist.  Per my last clinic note regarding her blood pressure, she was on lisinopril 5 mg twice daily and amlodipine 10 mg daily.  Of note, her cardiologist recommended increasing lisinopril to 20 mg daily; however ambulatory blood pressure monitoring performed through family medicine clinic soon after indicated lower average blood pressures at home compared to clinic.  At that time, decision was made to keep her on lisinopril 5 twice daily due to risk of symptomatic hypotension.   Ms. Jolicoeur reported she was not taking the amlodipine anymore, but cannot recall why or which doctor instructed her to do so.  While on the phone, completed comprehensive chart review and cannot find any note indicating discontinuation of amlodipine.  Instructed Ms. Mounsey to restart amlodipine.  Start low with 5 mg daily for a week then 10 mg daily thereafter.  Our next appointment is coming up on June 13, we will follow-up on tolerance and BP at that time.  Fayette Pho, MD

## 2021-04-27 ENCOUNTER — Other Ambulatory Visit: Payer: Self-pay

## 2021-05-01 ENCOUNTER — Other Ambulatory Visit: Payer: Self-pay

## 2021-05-01 ENCOUNTER — Encounter: Payer: Self-pay | Admitting: Family Medicine

## 2021-05-01 ENCOUNTER — Ambulatory Visit: Payer: Medicare (Managed Care) | Admitting: Family Medicine

## 2021-05-01 VITALS — BP 136/56 | HR 83 | Ht 62.0 in | Wt 167.4 lb

## 2021-05-01 DIAGNOSIS — I10 Essential (primary) hypertension: Secondary | ICD-10-CM

## 2021-05-01 DIAGNOSIS — F32A Depression, unspecified: Secondary | ICD-10-CM

## 2021-05-01 DIAGNOSIS — G894 Chronic pain syndrome: Secondary | ICD-10-CM

## 2021-05-01 DIAGNOSIS — F419 Anxiety disorder, unspecified: Secondary | ICD-10-CM | POA: Diagnosis not present

## 2021-05-01 DIAGNOSIS — E1142 Type 2 diabetes mellitus with diabetic polyneuropathy: Secondary | ICD-10-CM

## 2021-05-01 DIAGNOSIS — M797 Fibromyalgia: Secondary | ICD-10-CM

## 2021-05-01 DIAGNOSIS — Z794 Long term (current) use of insulin: Secondary | ICD-10-CM | POA: Diagnosis not present

## 2021-05-01 LAB — POCT GLYCOSYLATED HEMOGLOBIN (HGB A1C): HbA1c, POC (controlled diabetic range): 7.9 % — AB (ref 0.0–7.0)

## 2021-05-01 MED ORDER — FLUOXETINE HCL 10 MG PO CAPS: ORAL_CAPSULE | ORAL | 3 refills | Status: DC

## 2021-05-01 NOTE — Progress Notes (Signed)
SUBJECTIVE:   CHIEF COMPLAINT / HPI:   Anxiety and depression - feels like the depression is doing much better - not crying at much throughout the day - doing a virtual counseling group out of [place] that a family member recommended - feels like the anxiety is predominant issue right now - lots of physical symptoms of anxiety; chest tightness sometimes, heartburn, jaw pain, headaches, restless legs - feels like "fight or flight" mode is always on when she is outside the house  Chronic pain - tried duloxetine with taper up - stopped due to side effects - in addition to some stomach upset, feels like her anxiety and GERD symptoms are much worse with it - felt very jumpy while taking duloxetine - would like to try something else  HTN - blood pressure much improved today from last visit - taking lisinopril Bid as prescribed and amlodipine 10 mg qHS  PERTINENT  PMH / PSH: HTN, T2DM, CKD3, hepatic steatosis, GERD, osteoporosis, fibromyalgia, anxiety, depression, legally blind  OBJECTIVE:   BP (!) 136/56   Pulse 83   Ht 5\' 2"  (1.575 m)   Wt 167 lb 6 oz (75.9 kg)   SpO2 96%   BMI 30.61 kg/m    PHQ-9:  Depression screen [facility] 2/9 05/01/2021 05/01/2021 03/29/2021  Decreased Interest 2 2 1   Down, Depressed, Hopeless 1 1 2   PHQ - 2 Score 3 3 3   Altered sleeping 2 2 2   Tired, decreased energy 2 2 2   Change in appetite 1 1 1   Feeling bad or failure about yourself  3 3 2   Trouble concentrating 2 2 1   Moving slowly or fidgety/restless 0 0 1  Suicidal thoughts 0 0 0  PHQ-9 Score 13 13 12   Difficult doing work/chores Very difficult - Somewhat difficult  Some recent data might be hidden     GAD-7:  GAD 7 : Generalized Anxiety Score 09/08/2020 08/11/2020 05/26/2020 02/04/2020  Nervous, Anxious, on Edge 3 3 3 3   Control/stop worrying 3 3 3 3   Worry too much - different things 3 3 3 3   Trouble relaxing 3 3 3 3   Restless 3 - 1 0  Easily annoyed or irritable 0 0 1 1  Afraid - awful  might happen 3 3 1 3   Total GAD 7 Score 18 - 15 16  Anxiety Difficulty Very difficult - Somewhat difficult Somewhat difficult     Physical Exam General: Awake, alert, oriented Cardiovascular: Regular rate and rhythm, S1 and S2 present, no murmurs auscultated Respiratory: Lung fields clear to auscultation bilaterally Neuro: Cranial nerves II through X grossly intact, able to move all extremities spontaneously   ASSESSMENT/PLAN:   Anxiety and depression DC duloxetine as side effects intolerable. Patient considers anxiety to be predominant concern at this time, exacerbating other medical conditions. Before duloxetine trial, had never before tried any SSRI/SNRI. Will try prozac, hoping SSRI will give her fewer anxiety-related side effects than duloxetine. Will start prozac at 5 mg daily and increase by 5 mg each week. Follow up in one month.   Type 2 diabetes mellitus with diabetic polyneuropathy, with long-term current use of insulin (HCC) A1c today 7.9, much improved from 9.9 in January. Weight 167 lb, down about 30 lb since January. Tolerating meds well, will continue ozempic and farxiga. Next check in 3 months.   Hypertension BP much improved today, 136/56. Doing well on lisinopril 5 mg BID and amlodipine 10 mg qHS. No peripheral edema. Last BMP 03/30/21 demonstrates Cr  0.99, K+ wnl. No changes to medications at this time.      Fayette Pho, MD [facility]

## 2021-05-01 NOTE — Assessment & Plan Note (Signed)
DC duloxetine as side effects intolerable. Patient considers anxiety to be predominant concern at this time, exacerbating other medical conditions. Before duloxetine trial, had never before tried any SSRI/SNRI. Will try prozac, hoping SSRI will give her fewer anxiety-related side effects than duloxetine. Will start prozac at 5 mg daily and increase by 5 mg each week. Follow up in one month.

## 2021-05-01 NOTE — Assessment & Plan Note (Signed)
A1c today 7.9, much improved from 9.9 in January. Weight 167 lb, down about 30 lb since January. Tolerating meds well, will continue ozempic and farxiga. Next check in 3 months.

## 2021-05-01 NOTE — Assessment & Plan Note (Signed)
BP much improved today, 136/56. Doing well on lisinopril 5 mg BID and amlodipine 10 mg qHS. No peripheral edema. Last BMP 03/30/21 demonstrates Cr 0.99, K+ wnl. No changes to medications at this time.

## 2021-05-02 ENCOUNTER — Other Ambulatory Visit: Payer: Self-pay | Admitting: Family Medicine

## 2021-05-02 DIAGNOSIS — G894 Chronic pain syndrome: Secondary | ICD-10-CM

## 2021-05-02 DIAGNOSIS — F32A Depression, unspecified: Secondary | ICD-10-CM

## 2021-05-02 DIAGNOSIS — M797 Fibromyalgia: Secondary | ICD-10-CM

## 2021-05-04 ENCOUNTER — Encounter: Payer: Self-pay | Admitting: Family Medicine

## 2021-05-04 ENCOUNTER — Other Ambulatory Visit: Payer: Self-pay | Admitting: Family Medicine

## 2021-05-04 DIAGNOSIS — G894 Chronic pain syndrome: Secondary | ICD-10-CM

## 2021-05-04 DIAGNOSIS — F419 Anxiety disorder, unspecified: Secondary | ICD-10-CM

## 2021-05-04 DIAGNOSIS — F32A Depression, unspecified: Secondary | ICD-10-CM

## 2021-05-04 DIAGNOSIS — M797 Fibromyalgia: Secondary | ICD-10-CM

## 2021-05-04 MED ORDER — FLUOXETINE HCL 10 MG PO CAPS: ORAL_CAPSULE | ORAL | 3 refills | Status: DC

## 2021-05-05 ENCOUNTER — Other Ambulatory Visit: Payer: Medicare (Managed Care)

## 2021-05-06 ENCOUNTER — Encounter: Payer: Self-pay | Admitting: Family Medicine

## 2021-05-09 ENCOUNTER — Telehealth: Payer: Self-pay | Admitting: *Deleted

## 2021-05-09 NOTE — Telephone Encounter (Signed)
Called patient and updated her on the wait list we have for GERI clinic right now.  I told her that she is next on the list if someone cancels but if not it will be august before I can schedule her appt.  Patient voiced understanding.  Ixchel Duck,CMA

## 2021-05-09 NOTE — Telephone Encounter (Signed)
-----   Message from Aquilla Solian, [place] sent at 05/09/2021  9:10 AM EDT ----- Regarding: FW: Please call and sched Geri clinic  ----- Message ----- From: Mallie Mussel Sent: 05/08/2021   8:46 AM EDT To: Aquilla Solian, CMA Subject: FW: Please call and sched Geri clinic          Are you still scheduling these? ----- Message ----- From: Fayette Pho, MD Sent: 05/06/2021   4:00 PM EDT To: Bevelyn Ngo Admin Subject: Please call and sched [facility]              Lovelies,  Please call and schedule this patient with Dr. Perley Jain in [place] [facility].   She needs a competency determination.   Thank you so much, [employer]

## 2021-05-11 ENCOUNTER — Encounter: Payer: Self-pay | Admitting: Family Medicine

## 2021-05-12 ENCOUNTER — Other Ambulatory Visit: Payer: Self-pay | Admitting: Family Medicine

## 2021-05-12 DIAGNOSIS — F419 Anxiety disorder, unspecified: Secondary | ICD-10-CM

## 2021-05-12 DIAGNOSIS — F32A Depression, unspecified: Secondary | ICD-10-CM

## 2021-05-14 ENCOUNTER — Other Ambulatory Visit: Payer: Self-pay | Admitting: Family Medicine

## 2021-05-14 DIAGNOSIS — F32A Depression, unspecified: Secondary | ICD-10-CM

## 2021-05-15 ENCOUNTER — Telehealth: Payer: Self-pay | Admitting: Family Medicine

## 2021-05-15 NOTE — Telephone Encounter (Signed)
Called patient to discuss MyChart message just received today 6/27.  No answer, left HIPAA safe voicemail.  Will respond directly to MyChart message.  Will provide after-hours emergency line information, [facility] behavioral health center information, and make same-day appointment for Wednesday morning.  Fayette Pho, MD

## 2021-05-16 ENCOUNTER — Other Ambulatory Visit: Payer: Self-pay | Admitting: Family Medicine

## 2021-05-16 DIAGNOSIS — F32A Depression, unspecified: Secondary | ICD-10-CM

## 2021-05-16 NOTE — Progress Notes (Signed)
    SUBJECTIVE:   CHIEF COMPLAINT / HPI:   Anxiety: Patient recently started Prozac a few weeks ago.  Took it for 5 to 6 days before stopping due to side effects which included dizziness, nausea, insomnia.  Has been off it for over a week.  States he has not slept more than a few hours at night for the last few weeks.  Has prescription for Ativan but did not feel comfortable taking it while she was taking the Prozac.  She is only taking the Ativan at most once a week.  Has previously tried sertraline.  Has never tried Lexapro or Celexa.  She would like to take something "as needed" and not something that she has taken every day.  She states her insurance company will not cover a therapist which is why she has not gone to one.   PERTINENT  PMH / PSH: Severe anxiety  OBJECTIVE:   BP (!) 158/78   Pulse 98   Wt 167 lb (75.8 kg)   SpO2 98%   BMI 30.54 kg/m   General: Alert, oriented.  Emotionally agitated. Psych: Patient tearful and emotional.  Constantly rocking back and forth and rolling a handkerchief in her hands.  ASSESSMENT/PLAN:   Anxiety and depression patient complaining of side effects from Prozac.  Patient appears severely anxious on exam.  Will try lowest dose of Lexapro every other day and build up from there.  Also prescribed trazodone to use half a tablet at night as needed.  Advised her she can continue to use Ativan while on these other medications but encouraged her to not increase the frequency of use from what she is currently doing.  Sent in referral to social work for help with finding a therapist.  Also advised her to inquire about therapy with family service of the [place].  Advised to follow-up with PCP in 2 weeks.       Sandre Kitty, MD [facility]

## 2021-05-17 ENCOUNTER — Ambulatory Visit: Payer: Medicare (Managed Care) | Admitting: Family Medicine

## 2021-05-17 ENCOUNTER — Encounter: Payer: Self-pay | Admitting: Family Medicine

## 2021-05-17 ENCOUNTER — Other Ambulatory Visit: Payer: Self-pay

## 2021-05-17 VITALS — BP 158/78 | HR 98 | Wt 167.0 lb

## 2021-05-17 DIAGNOSIS — F419 Anxiety disorder, unspecified: Secondary | ICD-10-CM | POA: Diagnosis not present

## 2021-05-17 DIAGNOSIS — F32A Depression, unspecified: Secondary | ICD-10-CM

## 2021-05-17 MED ORDER — TRAZODONE HCL 50 MG PO TABS: 25.0000 mg | ORAL_TABLET | Freq: Every evening | ORAL | 1 refills | Status: DC | PRN

## 2021-05-17 MED ORDER — ESCITALOPRAM OXALATE 5 MG PO TABS: 5.0000 mg | ORAL_TABLET | Freq: Every day | ORAL | 1 refills | Status: DC

## 2021-05-17 NOTE — Patient Instructions (Addendum)
It was nice to see you today,  We did the following things today: - I have added a medication called Lexapro.  Take 1 tablet of this every night.  Start by taking 1 tablet every other day if you would like.  You can then build up to taking it every day. - I have added a medicine called trazodone to help you sleep.  Please take half a tablet as needed at night to help with sleep and anxiety. - Continue to use your Ativan as you have been previously. - I have put in a referral to our Child psychotherapist.  Someone should call you regarding setting up with a therapist. - I have also included the information for [place] services of the [place].  Please call them at your convenience.  They may be able to see you for low cost or free therapy.  Please schedule a follow-up appointment with your PCP, Dr. Larita Fife, in 2 weeks.  Have a great day,  Frederic Jericho, MD  [facility] the [place] ([place]) walk in M-F 8am-12pm and  1pm-3pm [place]- [address]     [phone]  [facility] -[address]  Phone: [phone]

## 2021-05-17 NOTE — Assessment & Plan Note (Signed)
patient complaining of side effects from Prozac.  Patient appears severely anxious on exam.  Will try lowest dose of Lexapro every other day and build up from there.  Also prescribed trazodone to use half a tablet at night as needed.  Advised her she can continue to use Ativan while on these other medications but encouraged her to not increase the frequency of use from what she is currently doing.  Sent in referral to social work for help with finding a therapist.  Also advised her to inquire about therapy with family service of the [place].  Advised to follow-up with PCP in 2 weeks.

## 2021-05-18 ENCOUNTER — Other Ambulatory Visit: Payer: Self-pay | Admitting: Family Medicine

## 2021-05-18 ENCOUNTER — Encounter: Payer: Self-pay | Admitting: Family Medicine

## 2021-05-18 DIAGNOSIS — F32A Depression, unspecified: Secondary | ICD-10-CM

## 2021-05-18 MED ORDER — LORAZEPAM 0.5 MG PO TABS: ORAL_TABLET | ORAL | 0 refills | Status: DC

## 2021-05-18 NOTE — Progress Notes (Signed)
Refill for lorazepam. Previous accidentally set to "print" instead of electronically sending to pharmacy. PDMP reviewed, is appropriate.   Fayette Pho, MD

## 2021-05-24 ENCOUNTER — Telehealth: Payer: Self-pay | Admitting: *Deleted

## 2021-05-24 ENCOUNTER — Ambulatory Visit: Payer: Self-pay | Admitting: Licensed Clinical Social Worker

## 2021-05-24 NOTE — Chronic Care Management (AMB) (Signed)
  Care Management   Note  05/24/2021 Name: FLOIS MCTAGUE MRN: [id number] DOB: 1955-11-29  SAMORIA FEDORKO is a 65 y.o. year old female who is a primary care patient of Fayette Pho, MD. I reached out to [name] by phone today in response to a referral sent by Ms. Camille Bal PCP, Fayette Pho, MD.    Ms. Krukowski was given information about care management services today including:  Care management services include personalized support from designated clinical staff supervised by her physician, including individualized plan of care and coordination with other care providers 24/7 contact phone numbers for assistance for urgent and routine care needs. The patient may stop care management services at any time by phone call to the office staff.  Patient agreed to services and verbal consent obtained.   Follow up plan: Telephone appointment with care management team member scheduled for:05/30/2021  [facility] Guide, Embedded Care Coordination [facility] Management

## 2021-05-24 NOTE — Chronic Care Management (AMB) (Signed)
  Care Management  Collaboration  Note  05/24/2021 Name: MICKENZIE STOLAR MRN: [id number] DOB: 08-30-1956  TELIA AMUNDSON is a 65 y.o. year old female who is a primary care patient of Fayette Pho, MD. The CCM team was consulted reference care coordination needs for Mental Health Counseling and Resources.  Assessment: Patient was not interviewed or contacted during this encounter. CCM LCSW collaborated with LCSW at [facility] via [facility],  CCM care guide and referring provider.  Mix up in referral process.  Intervention:Conducted brief assessment, recommendations and relevant information discussed.    Follow up Plan: Collaboration with CCM Care guide to get patient scheduled with CCM LCSW.    Review of patient past medical history, allergies, medications, and health status, including review of pertinent consultant reports was performed as part of comprehensive evaluation and provision of care management/care coordination services.     Sammuel Hines, LCSW Care Management & Coordination  [facility]   [phone] 9:10 AM

## 2021-05-25 ENCOUNTER — Ambulatory Visit: Payer: Medicare (Managed Care) | Admitting: Family Medicine

## 2021-05-25 ENCOUNTER — Other Ambulatory Visit: Payer: Self-pay

## 2021-05-25 VITALS — BP 150/75 | HR 82 | Ht 62.0 in | Wt 167.0 lb

## 2021-05-25 DIAGNOSIS — F39 Unspecified mood [affective] disorder: Secondary | ICD-10-CM

## 2021-05-25 DIAGNOSIS — Z0189 Encounter for other specified special examinations: Secondary | ICD-10-CM

## 2021-05-25 DIAGNOSIS — R430 Anosmia: Secondary | ICD-10-CM | POA: Diagnosis not present

## 2021-05-25 DIAGNOSIS — H548 Legal blindness, as defined in USA: Secondary | ICD-10-CM

## 2021-05-25 DIAGNOSIS — E669 Obesity, unspecified: Secondary | ICD-10-CM | POA: Insufficient documentation

## 2021-05-25 DIAGNOSIS — I6782 Cerebral ischemia: Secondary | ICD-10-CM

## 2021-05-25 DIAGNOSIS — I6381 Other cerebral infarction due to occlusion or stenosis of small artery: Secondary | ICD-10-CM | POA: Insufficient documentation

## 2021-05-25 HISTORY — DX: Other cerebral infarction due to occlusion or stenosis of small artery: I63.81

## 2021-05-25 NOTE — Progress Notes (Signed)
Provider:  Lissa Morales, MD Location:   [facility] [place] of Service:     PCP: Ezequiel Essex, MD Patient Care Team: Ezequiel Essex, MD as PCP - General (Family Medicine) [facility], Melanie Crazier, MD as Consulting Physician (Endocrinology) Pieter Partridge, DO as Consulting Physician (Neurology) Maurine Cane, LCSW as Social Worker (Licensed Clinical Social Worker)  Extended Emergency Contact Information Primary Emergency Contact: Marcene Brawn Mobile Phone: [phone] Relation: Daughter  Code Status: DNR Goals of Care: Advanced Directive information Advanced Directives 05/25/2021  Does Patient Have a Medical Advance Directive? Yes  Type of Paramedic of [place];Living will  Does patient want to make changes to medical advance directive? -  Copy of [facility] in Chart? Yes - validated most recent copy scanned in chart (See row information)  Would patient like information on creating a medical advance directive? -     [facility]:   Patient is alone Primary caregiver:  patient Patient's Currently living arrangement:  patient Patient information was obtained from none  patient. History/Exam limitations:  blindness Primary Care Provider:   Dr Ezequiel Essex Referring provider:  Dr Ezequiel Essex Reason for referral: Patient needs evaluation to to determine if she has competency to manage her own finances  ----------------------------------------------------------------------------------------------------------------------------------------------------------------------------------------------------------------------------------------------------------------   HPI by problems:  Chief Complaint  Patient presents with   competency evaluation    Cognitive impairment concern  Are there problems with thinking?  Cognition domains: Memory difficulties, Social interactions  difficulties, Getting lost in familiar places, Learning difficulty with new activities or information, Task switching difficulties, and Planning difficulties  When were the changes first noticed?  years  Did this change occur abruptly or gradually?  gradual but has progressed over the last 6 months  How have the changes progressed since then?  worsening  Has there been any tremors or abnormal movements?  no  Have they had in hallucinations or delusions:  yes, patient reports that she has been hearing her mother's voice as well as her grandmother's voice calling her name at irregular times  Have they appeared more anxious or sad lately?  yes, patient and her husband are separated but not legally divorced.  Do they still have interests or activities they enjor doing?  no  How has their appetite been lately?  are worsening  How has their sleep been lately?  Poor sleep, she feels that she is tired around 5 PM but is afraid to go to sleep and ends up staying up until 1 PM.  She then sleeps until 6 AM    Compared to 5 to 10 years ago, how is the patient at:  Problems with Judgment, e.g., problem making decisions, bad financial decisions, problems with thinking?  yes  Less interested in hobbies or previously enjoyed activities?  yes, she feels like she is alone and has no interest in doing things   Problem remembering things about family and friends e.g. names,  occupations, birthdays, addresses?  no  Problem remembering conversations or news events a few days later?  yes, reports that her daughters constantly get on her about repeating conversations  Problem remembering what day and month it is? no  Problem with losing things?  yes, repeatedly loses household items and cannot find them  Problem learning to use a new gadget or machine around the house, e.g., cell phones, computer, microwave, remote control?  yes  Problem with handling money for shopping?   no  Problem handling financial matters, e.g. their pension, checking, credit cards, dealing with the bank?  no, but she does not do this at this time  Problem with getting lost in familiar places?  yes   Geriatric Depression Scale:  15 / 15 Denies any SI or HI   Outpatient Encounter Medications as of 05/25/2021  Medication Sig   Ascorbic Acid (VITAMIN C PO) Take 1 tablet by mouth daily.   atorvastatin (LIPITOR) 40 MG tablet Take 1 tablet (40 mg total) by mouth at bedtime.   BD PEN NEEDLE NANO 2ND GEN 32G X 4 MM MISC USE WITH INSULIN   dapagliflozin propanediol (FARXIGA) 5 MG TABS tablet Take 2 tablets (10 mg total) by mouth daily. (Patient not taking: No sig reported)   escitalopram (LEXAPRO) 5 MG tablet Take 1 tablet (5 mg total) by mouth daily.   famotidine (PEPCID) 20 MG tablet Take 1 tablet (20 mg total) by mouth 2 (two) times daily for 15 days.   fluticasone (FLONASE) 50 MCG/ACT nasal spray Place 1 spray into both nostrils daily as needed for allergies or rhinitis.   furosemide (LASIX) 40 MG tablet Take 1 tablet (40 mg total) by mouth daily.   gabapentin (NEURONTIN) 300 MG capsule TAKE 1 TO 2 CAPSULES THREE TIMES DAILY (Patient taking differently: 600 mg 3 (three) times daily.)   insulin NPH-regular Human (NOVOLIN 70/30 RELION) (70-30) 100 UNIT/ML injection Inject 50 Units into the skin 2 (two) times daily with a meal.   Lancet Devices (ONE TOUCH DELICA LANCING DEV) MISC Please use to check blood sugar up to 4 times daily. E11.42   Lancets Misc. (ACCU-CHEK SOFTCLIX LANCET DEV) KIT Use as instructed to check blood sugar 4 times daily. Dx E11.42   lisinopril (ZESTRIL) 5 MG tablet Take 1 tablet (5 mg total) by mouth 2 (two) times daily.   LORazepam (ATIVAN) 0.5 MG tablet TAKE 1 TABLET BY MOUTH EVERY DAY AS NEEDED FOR ANXIETY   magnesium oxide (MAG-OX) 400 (241.3 Mg) MG tablet Take 1 tablet (400 mg total) by mouth 2 (two) times daily.   metFORMIN (GLUCOPHAGE-XR) 750 MG 24 hr tablet  Take 1 tablet (750 mg total) by mouth 2 (two) times daily with a meal.   Multiple Vitamin (MULTIVITAMIN PO) Take 1 tablet by mouth daily.   OneTouch Delica Lancets 84Z MISC Please use to check blood sugar up to 4 times daily. E11.42   potassium chloride SA (KLOR-CON) 20 MEQ tablet Take 1 tablet (20 mEq total) by mouth 2 (two) times daily.   Semaglutide,0.25 or 0.5MG/DOS, (OZEMPIC, 0.25 OR 0.5 MG/DOSE,) 2 MG/1.5ML SOPN Inject 0.5 mg into the skin once a week. (Patient taking differently: Inject 0.25 mg into the skin once a week.)   sucralfate (CARAFATE) 1 g tablet Take 1 tablet (1 g total) by mouth 4 (four) times daily -  with meals and at bedtime.   traZODone (DESYREL) 50 MG tablet Take 0.5 tablets (25 mg total) by mouth at bedtime as needed for sleep.   [DISCONTINUED] lactulose (CHRONULAC) 10 GM/15ML solution TAKE 15-30 MLS (10-20 G TOTAL) TWO TIMES DAILY AS NEEDED FOR MILD CONSTIPATION OR MODERATE CONSTIPATION.   No facility-administered encounter medications on file as of 05/25/2021.    History Patient Active Problem List   Diagnosis Date Noted   Obesity (BMI 30.0-34.9) 05/25/2021   Lacunar stroke, History of ([place]) 05/25/2021   Anosmia 05/25/2021   Encounter for competency evaluation 05/25/2021   Smoking history 03/29/2021   Osteoporosis 01/18/2021   Foot  pain, left 12/09/2020   Encephalomalacia on imaging study 08/25/2020   Fatty liver 05/31/2020   Chronic pain syndrome 04/23/2019   DDD (degenerative disc disease), cervical 04/16/2019   CKD stage 3 due to type 2 diabetes mellitus ([place]) 04/15/2019   Hypertension associated with diabetes ([place]) 07/18/2018   GERD (gastroesophageal reflux disease) 07/18/2018   OSA (obstructive sleep apnea) 07/18/2018   Type 2 diabetes mellitus with diabetic polyneuropathy, with long-term current use of insulin ([place]) 07/18/2018   Fibromyalgia 07/18/2018   Mood disorder ([place]) 07/18/2018   Hyperlipidemia associated with type 2 diabetes mellitus ([place])  07/18/2018   Legally blind 09/01/2003   Past Medical History:  Diagnosis Date   Allergic rhinitis 09/26/2018   Anxiety    Arthritis    Asthma    Brain aneurysm 07/18/2018   C. difficile colitis 07/18/2018   Cerebral aneurysm without rupture    Cerebral aneurysm without rupture    Chest pain 03/26/2020   Chronic kidney disease    CKD stage 3 due to type 2 diabetes mellitus ([place]) 04/15/2019   Depression    Diabetes mellitus without complication ([place])    Fibromyalgia    Former smoker    GERD (gastroesophageal reflux disease)    Heart murmur    History of chicken pox    History of colon polyps    Hyperlipidemia associated with type 2 diabetes mellitus ([place]) 07/18/2018   Hypertension    Hypertension associated with diabetes ([place]) 07/18/2018   IBS (irritable bowel syndrome)    Lacunar stroke ([place]) 05/25/2021   Lacunar stroke, History of ([place]) 05/25/2021   Brain MRI 08/25/21: Few small remote lacunar infarcts noted at the right caudate and left internal capsule   Legally blind    Migraines    Myalgia 04/23/2019   Paroxysmal atrial tachycardia ([employer])    On telemetry   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   Type 2 diabetes mellitus with hyperglycemia, with long-term current use of insulin ([place]) 12/03/2019   Past Surgical History:  Procedure Laterality Date   ABDOMINAL HYSTERECTOMY  2012   APPENDECTOMY  1972   CEREBRAL ANEURYSM REPAIR Left 2004   Aneurysm clip in place near the level of the left ICA on Brain MRI 08/2020. Also coiling performed   CHOLECYSTECTOMY  2015   CRANIOTOMY Left    left pterional craniotomy   MENISCUS REPAIR Bilateral    Family History  Problem Relation Age of Onset   Alcohol abuse Mother    Diabetes Mother    Hypertension Mother    Kidney disease Mother    COPD Father    Diabetes Father    Early death Father    Heart disease Father    Hyperlipidemia Father     Hypertension Father    Diabetes Sister    Hypertension Sister    Breast cancer Sister    Cancer Maternal Grandmother    Alcohol abuse Maternal Grandfather    Cancer Maternal Grandfather    Cancer Paternal Grandmother    Cancer Paternal Grandfather    Diabetes Sister    Hypertension Sister    Kidney disease Sister    Social History   Socioeconomic History   Marital status: Legally Separated    Spouse name: Terrace Fontanilla   Number of children: 3   Years of education: Not on file   Highest education level: Associate degree: academic program  Occupational History   Occupation: disabled  Tobacco Use   Smoking status: Former    Packs/day: 1.00    Years: 3.00    Pack years: 3.00    Types: Cigarettes    Quit date: 09/04/1989    Years since quitting: 31.7   Smokeless tobacco: Never  Vaping Use   Vaping Use: Never used  Substance and Sexual Activity   Alcohol use: Never   Drug use: Never   Sexual activity: Not Currently  Other Topics Concern   Not on file  Social History Narrative   Patient is right-handed. She lives with her husband in a 2 story house. She walks the dog daily for exercise. She drinks I cup of coffee and 3 glasses of unsweet tea.   Social Determinants of Health   Financial Resource Strain: Low Risk    Difficulty of Paying Living Expenses: Not very hard  Food Insecurity: No Food Insecurity   Worried About Charity fundraiser in the Last Year: Never true   Ran Out of Food in the Last Year: Never true  Transportation Needs: No Transportation Needs   Lack of Transportation (Medical): No   Lack of Transportation (Non-Medical): No  Physical Activity: Not on file  Stress: Stress Concern Present   Feeling of Stress : Very much  Social Connections: Not on file      Cardiovascular Risk Factors: CVA, Hypertension, IDDM Type 2, and Obesity  Educational History: 14 years formal education Personal History of Seizures: No  Personal History of Stroke: Yes -lacunar  infarct as well as aneurysm Personal History of Head Trauma: Yes -brain aneurysm which resulted in brain surgery with a clip and coil procedure but there was a complication during the procedure.  When she woke up she was having difficulty with peripheral vision, memory, cognition, walking Personal History of Psychiatric Disorders: Yes -depression and anxiety   Basic Activities of Daily Living  Dressing: Self-care Eating: Self-care Ambulation: Self-care Toileting: Self-care Bathing: Self-care  Instrumental Activities of Daily Living Shopping: Self-care House/Yard Work: Self-care Administration of medications: Partial assistance Finances: Partial assistance Telephone: Self-care Transportation: Total assistance  Mobility Assist Devices: No assist devices  Caregivers in home: patient   Formal [place]  Physical Therapy: no  Occupational Therapy: no             Home Aid / Personal Care Service: no             Homemaker services: no  FALLS in last five office visits:  Fall Risk  05/25/2021 05/01/2021 02/22/2021 12/09/2020 11/10/2020  Falls in the past year? 1 1 0 0 1  Number falls in past yr: 1 0 0 0 1  Injury with Fall? 1 1 0 0 0  Risk for fall due to : Impaired balance/gait - - - -  Follow up - - - - Falls evaluation completed;Follow up appointment  Comment - - - - md informed    Health Maintenance reviewed: Immunization History  Administered Date(s) Administered   Influenza, Quadrivalent, Recombinant, Inj, Pf 07/30/2019   Influenza,inj,Quad PF,6+ Mos 07/18/2018   Influenza-Unspecified 07/30/2019, 09/14/2020   PFIZER(Purple Top)SARS-COV-2 Vaccination 02/03/2020, 02/24/2020, 09/15/2020   Tdap 01/24/2016   Health Maintenance Topics with due status: Overdue     Topic Date Due   Pneumococcal Vaccine 55-27 Years old Never done   Zoster Vaccines- Shingrix Never done   COVID-19 Vaccine 12/16/2020    Diet: Regular  Geriatric Syndromes: Constipation yes   Laxative use:no   Incontinence yes, stress incontinence  Dizziness no  Skin problems yes   Visual Impairment yes   Hearing impairment no Dentures problems: no Impaired Memory or Cognition yes   Behavioral problems no   Sleep problems yes   Weight loss yes Drug Misadventure: yes   Immobility: no  Vital Signs Weight: 167 lb (75.8 kg) Body mass index is 30.54 kg/m. CrCl cannot be calculated (Patient's most recent lab result is older than the maximum 21 days allowed.). Body surface area is 1.82 meters squared. Vitals:   05/25/21 1415  BP: (!) 150/75  Pulse: 82  SpO2: 98%  Weight: 167 lb (75.8 kg)  Height: _0  (1.575 m)   Wt Readings from Last 3 Encounters:  05/25/21 167 lb (75.8 kg)  05/17/21 167 lb (75.8 kg)  05/01/21 167 lb 6 oz (75.9 kg)   Hearing Screening  Method: Audiometry   _1  _2  _3  _4   Right ear Fail Fail 40 Fail  Left ear Fail Fail 40 Fail   Vision Screening   Right eye Left eye Both eyes  Without correction     With correction _5    Physical Examination:  VS reviewed Physical Exam  Vitals:   05/25/21 1415  BP: (!) 150/75  Pulse: 82  SpO2: 98%    HEENT: Bilateral EAC adequately patent, I.e., able to see TMs General physical exam: well-dressed and groomed. Pleasant and cooperative. Cardiac: Regular rate and rhythm without murmur. No carotid bruits Lungs: Clear to auscultation.   No other insights were derived from the general Exam   Parkinsonism Yes _6  No _7   Rigidity Ab nl ? Yes _8  No _9   Cranial Nerve Abnl ? Yes _10  No _11   Motor Abnl ? Yes _12  No _13   Tremor ? Yes _14  No _15      Mini-Mental State Examination or Montreal Cognitive Assessment:  Patient did  require additional cues or prompts to complete tasks. Patient was cooperative and attentive to testing tasks Patient did  appear motivated to perform well    No flowsheet data found.  No flowsheet data found.      [place] Cognitive Assessment   05/25/2021  Visuospatial/ Executive (0/5) 0  Naming (0/3) 0  Attention: Read list of digits (0/2) 0  Attention: Read list of letters (0/1) 1  Attention: Serial 7 subtraction starting at 100 (0/3) 3  Language: Repeat phrase (0/2) 0  Language : Fluency (0/1) 1  Abstraction (0/2) 1  Delayed Recall (0/5) 2  Orientation (0/6) 6  Total 14  Adjusted Score (based on education) 14     Labs No components found for: [facility]  Lab Results  Component Value Date   [id number] 009 11/10/2020    Lab Results  Component Value Date   FOLATE 21.8 07/31/2019    Lab Results  Component Value Date   TSH 1.420 11/10/2020    No results found for: RPR  Lab Results  Component Value Date   HIV Non Reactive 03/26/2020      Chemistry      Component Value Date/Time   NA 138 03/30/2021 2138   NA 140 12/09/2020 0957   K 3.7 03/30/2021 2138   CL 103 03/30/2021 2138   CO2 21 (L) 03/30/2021 2138   BUN 20 03/30/2021 2138   BUN 19 12/09/2020 0957   CREATININE 0.99 03/30/2021 2138   CREATININE 1.05 (H) 04/27/2020 0844      Component Value Date/Time   CALCIUM 9.7 03/30/2021 2138   ALKPHOS 109 12/09/2020 0957   AST 32 12/09/2020 0957  ALT 48 (H) 12/09/2020 0957   BILITOT 0.4 12/09/2020 0957       CrCl cannot be calculated (Patient's most recent lab result is older than the maximum 21 days allowed.).   Lab Results  Component Value Date   HGBA1C 7.9 (A) 05/01/2021     _0 @ Hearing Screening  Method: Audiometry   _1  _2  _3  _4   Right ear Fail Fail 40 Fail  Left ear Fail Fail 40 Fail   Vision Screening   Right eye Left eye Both eyes  Without correction     With correction _5   Lab Results  Component Value Date   WBC 5.8 03/30/2021   HGB 14.3 03/30/2021   HCT 42.5 03/30/2021   MCV 83.8 03/30/2021   PLT 171 03/30/2021    No results found for this or any previous visit (from the past 24 hour(s)).  Imaging  Brain MRI:  IMPRESSION: MRI HEAD IMPRESSION:   1. No acute intracranial abnormality. 2. Postoperative changes from previous left pterional craniotomy with aneurysm clip in place at the level of the left ICA terminus. Associated mild postoperative left frontal encephalomalacia and gliosis. 3. Few small remote lacunar infarcts involving the right caudate and left internal capsule.   MRA HEAD IMPRESSION:   Stable intracranial MRA. 2 x 4 mm focal outpouching arising from the supraclinoid right ICA favored to reflect an infundibulum. No new aneurysm.     Electronically Signed   By: Jeannine Boga M.D.   On: 08/26/2020 05:45   Personal [place]     ------------------------------------------------------------------------------------------------------------------------------------------------------------------------------------------------------------------------------------------------------------------------------------------------------------------------------------------------------------------------------------------------------------------------------------------------------------------------------------------------------------  Assessment and Plan: Please see individual consultation notes from physical therapy, pharmacy and social work for today.    Problem List Items Addressed This Visit       Other   Anosmia   Encounter for competency evaluation - Primary    Patient presents for evaluation to determine if she has competency to manage her own finances.  Her husband currently receives her Social Security checks but they are separated but not legally divorced yet.  She wishes to receive her own checks because she feels like she is a burden on her former significant other as well as children.  Patient does have deficits with cognition and memory but feels she is able to manage her own finances.  A concern is that the patient's  husband may be her legal guardian which would make determination difficult.  She is going to try and determine if he is the legal guardian and she is going to meet with Casimer Lanius for assistance in this.  She is going to follow-up with her PCP for further management.       Encounter for competency evaluation Patient presents for evaluation to determine if she has competency to manage her own finances.  Her husband currently receives her Social Security checks but they are separated but not legally divorced yet.  She wishes to receive her own checks because she feels like she is a burden on her former significant other as well as children.  Patient does have deficits with cognition and memory but feels she is able to manage her own finances.  A concern is that the patient's husband may be her legal guardian which would make determination difficult.  She is going to try and determine if he is the legal guardian and she is going to meet with Casimer Lanius for assistance in this.  She is going to follow-up with her PCP  for further management.       Primary Contact: Extended Emergency Contact Information Primary Emergency Contact: Marcene Brawn Mobile Phone: [phone] Relation: Daughter  Patient to Follow up with  Dr.Lynn or [facility] as needed.   > 60 minutes face to face were spent in total with interdisciplinary discussion, patient and caretaker counseling and coordination of care took more than 20 minutes. The Geriatric interdisciplinary team meet to discuss the patient's assessment, problem list, and recommendations.  The interdisciplinary team consisted of representatives from medicine, pharmacy, physical therapy and social work. The interdisciplinary team meet with the patient and caretakers to review the team's findings, assessments, and recommendations.

## 2021-05-25 NOTE — Patient Instructions (Signed)
Please find out if your husband is your legal guardian.    If he is your legal guardian, then I believe it would be difficult to remove him from your social security benefit account. This could require an attorney to settle the matter.  I will ask [name], Social Worker, to contact you about this question of guardianship.

## 2021-05-25 NOTE — Assessment & Plan Note (Signed)
Patient presents for evaluation to determine if she has competency to manage her own finances.  Her husband currently receives her Social Security checks but they are separated but not legally divorced yet.  She wishes to receive her own checks because she feels like she is a burden on her former significant other as well as children.  Patient does have deficits with cognition and memory but feels she is able to manage her own finances.  A concern is that the patient's husband may be her legal guardian which would make determination difficult.  She is going to try and determine if he is the legal guardian and she is going to meet with Sammuel Hines for assistance in this.  She is going to follow-up with her PCP for further management.

## 2021-05-25 NOTE — Progress Notes (Signed)
Of note, patient is blind so used the [place] blind version

## 2021-05-26 ENCOUNTER — Telehealth: Payer: Self-pay

## 2021-05-26 ENCOUNTER — Encounter: Payer: Self-pay | Admitting: Family Medicine
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
## 2021-05-26 NOTE — Telephone Encounter (Signed)
Patient calls nurse line requesting to leave message for social worker. Patient reports that she had been working with Gavin Pound regarding removing husband from [employer]. Patient now states that husband is listed as legal guardian and cannot be removed.   Patient wanted to make SW aware of this information.   Veronda Prude, RN

## 2021-05-27 ENCOUNTER — Other Ambulatory Visit: Payer: Self-pay | Admitting: Family Medicine

## 2021-05-27 DIAGNOSIS — Z794 Long term (current) use of insulin: Secondary | ICD-10-CM

## 2021-05-27 DIAGNOSIS — E1142 Type 2 diabetes mellitus with diabetic polyneuropathy: Secondary | ICD-10-CM

## 2021-05-29 ENCOUNTER — Encounter: Payer: Self-pay | Admitting: Family Medicine

## 2021-05-29 DIAGNOSIS — I6782 Cerebral ischemia: Secondary | ICD-10-CM

## 2021-05-29 HISTORY — DX: Cerebral ischemia: I67.82

## 2021-05-29 NOTE — Assessment & Plan Note (Signed)
Ischemia following brain aneurysm rupture 2004

## 2021-05-29 NOTE — Assessment & Plan Note (Signed)
Established problem  Uncontrolled Given complex nature from patient's history of brain injury with cognitive impairment, Ms Cassandra Reid agrees that referral for consultation with psychiatry in the management of her mood disorder would be beneficial.   Referral was made to psychiatry.  Will ask Ms Bonnielee Haff,  again to assist patient in scheduling and attending the consultation.

## 2021-05-29 NOTE — Addendum Note (Signed)
Addended byPerley Jain, Vianna Venezia D on: 05/29/2021 02:58 PM   Modules accepted: Orders

## 2021-05-30 ENCOUNTER — Telehealth: Payer: Medicare (Managed Care)

## 2021-05-30 ENCOUNTER — Ambulatory Visit: Payer: Self-pay | Admitting: Licensed Clinical Social Worker

## 2021-05-30 DIAGNOSIS — Z7689 Persons encountering health services in other specified circumstances: Secondary | ICD-10-CM

## 2021-05-30 NOTE — Chronic Care Management (AMB) (Signed)
  Care Management  Collaboration  Note  05/30/2021 Name: Cassandra Reid MRN: [id number] DOB: 02/13/1956  Cassandra Reid is a 65 y.o. year old female who is a primary care patient of Fayette Pho, MD. The CCM team was consulted reference care coordination needs for Mental Health Counseling and Resources.  Assessment: Patient was not interviewed or contacted during this encounter. LCSW has phone appointment for initial assessment with patient.  Patient called office to cancel the phone appointment. This LCSW has not worked with patient on any of the information below.     Also calls nurse line requesting to leave message for social worker. Patient reports that she had been working with Gavin Pound regarding removing husband from [employer]. Patient now states that husband is listed as legal guardian and cannot be removed. Patient wanted to make SW aware of this information.     Intervention:Conducted brief assessment, recommendations and relevant information discussed.  CCM LCSW collaborated with CCM Care Guide and [facility] RN team and [facility].   Routed chart to CCM care guide to reschedule with LCSW Referral coordinator will place referral for psychiatry per request from [facility] provider   Follow up Plan: Patient is being referred to [facility] care guide to reschedule appointment with LCSW.   Review of patient past medical history, allergies, medications, and health status, including review of pertinent consultant reports was performed as part of comprehensive evaluation and provision of care management/care coordination services.     Sammuel Hines, LCSW Care Management & Coordination  [facility] / [facility]   [phone] 11:35 AM

## 2021-05-30 NOTE — Patient Instructions (Signed)
   Ms. Murray I am sorry you canceled your phone appointment with me today. Someone will call to see if you would like to rescheduled.   Sammuel Hines, LCSW Care Management & Coordination  [phone]

## 2021-06-05 ENCOUNTER — Ambulatory Visit: Payer: Medicare (Managed Care) | Admitting: Cardiology

## 2021-06-05 ENCOUNTER — Ambulatory Visit (INDEPENDENT_AMBULATORY_CARE_PROVIDER_SITE_OTHER): Payer: Medicare (Managed Care) | Admitting: Family Medicine

## 2021-06-05 ENCOUNTER — Other Ambulatory Visit: Payer: Self-pay

## 2021-06-05 ENCOUNTER — Encounter: Payer: Self-pay | Admitting: Family Medicine

## 2021-06-05 ENCOUNTER — Ambulatory Visit (INDEPENDENT_AMBULATORY_CARE_PROVIDER_SITE_OTHER): Payer: Medicare (Managed Care) | Admitting: Pharmacist

## 2021-06-05 VITALS — BP 140/70 | HR 84 | Ht 62.0 in | Wt 168.0 lb

## 2021-06-05 DIAGNOSIS — M797 Fibromyalgia: Secondary | ICD-10-CM

## 2021-06-05 DIAGNOSIS — M5030 Other cervical disc degeneration, unspecified cervical region: Secondary | ICD-10-CM

## 2021-06-05 DIAGNOSIS — M62838 Other muscle spasm: Secondary | ICD-10-CM | POA: Diagnosis not present

## 2021-06-05 DIAGNOSIS — E1142 Type 2 diabetes mellitus with diabetic polyneuropathy: Secondary | ICD-10-CM

## 2021-06-05 DIAGNOSIS — Z794 Long term (current) use of insulin: Secondary | ICD-10-CM

## 2021-06-05 DIAGNOSIS — G894 Chronic pain syndrome: Secondary | ICD-10-CM

## 2021-06-05 DIAGNOSIS — F39 Unspecified mood [affective] disorder: Secondary | ICD-10-CM

## 2021-06-05 MED ORDER — BACLOFEN 10 MG PO TABS: 10.0000 mg | ORAL_TABLET | Freq: Two times a day (BID) | ORAL | 1 refills | Status: DC | PRN

## 2021-06-05 MED ORDER — FIASP FLEXTOUCH 100 UNIT/ML ~~LOC~~ SOPN: 20.0000 [IU] | PEN_INJECTOR | Freq: Three times a day (TID) | SUBCUTANEOUS | 0 refills | Status: DC

## 2021-06-05 MED ORDER — INSULIN GLARGINE 100 UNITS/ML SOLOSTAR PEN: 40.0000 [IU] | PEN_INJECTOR | Freq: Every day | SUBCUTANEOUS | 0 refills | Status: DC

## 2021-06-05 NOTE — Assessment & Plan Note (Signed)
>>  ASSESSMENT AND PLAN FOR FIBROMYALGIA WRITTEN ON 06/05/2021  5:35 PM BY Fayette Pho, MD  S/p duloxetine trials brief x2.  Stopped due to anxiety and palpitations.  Trialed Prozac x5 days, stopped due to dizziness, nausea, insomnia.  Prescribed Lexapro 6/29, however patient has not yet started.  Patient interested in re-referral to previous neurosurgeon Dr. Shellia Cleverly for possible injections, referral placed today.  Prescribed low-dose baclofen for muscle spasms as needed.  Continue to recommend anxiety treatment, physical activity, SSRI/SNRI.  If Lexapro unsuccessful, could consider nortriptyline as next prescription trial.

## 2021-06-05 NOTE — Progress Notes (Signed)
Subjective:    Patient ID: Cassandra Reid, female    DOB: October 10, 1956, 65 y.o.   MRN: [id number]  HPI Patient is a 65 y.o. female who presents for diabetes management. She is in good spirits and presents without assistance. Patient was referred on 05/18/21 and last seen by Primary Care Provider prior to appt today.  Patient reports that she is currently in the donut hole and ran out of her Ozempic last Monday and is on her last little bit of Novolin 70/30. She states because of this she has noticed her blood glucose readings have increased. She states when she was on the insulin and the Ozempic her blood glucose readings were very well controlled and all her readings were in the 100's  Insurance coverage/medication affordability: [facility] Medicare  Current diabetes medications include: semaglutide (Ozempic) 0.5mg  once weekly, Novolin 70/30 50 units BID, metformin XR 750mg  BID  Patient states that She is taking her medications as prescribed. Patient denies adherence with medications.  Do you feel that your medications are working for you?  Yes when she was on both the Ozempic and insulin  Have you been experiencing any side effects to the medications prescribed? no  Do you have any problems obtaining medications due to transportation or finances?  Yes; currently in donut hole and unable to afford insulin and Ozempic   Patient denies hypoglycemic events. Patient denies polyuria (increased urination).  Patient denies polyphagia (increased appetite).  Patient denies polydipsia (increased thirst).  Patient denies neuropathy (nerve pain). Patient denies visual changes. Patient reports self foot exams.   Home fasting blood sugars: Reports they have been in the 200's  Objective:   Labs:   Physical Exam Neurological:     Mental Status: She is alert and oriented to person, place, and time.    Review of Systems  Gastrointestinal:  Negative for nausea and vomiting.   Lab Results   Component Value Date   HGBA1C 7.9 (A) 05/01/2021   HGBA1C 9.9 (H) 12/09/2020   HGBA1C 9.6 (A) 11/10/2020    Lab Results  Component Value Date   MICRALBCREAT 30-300 11/10/2020    Lipid Panel     Component Value Date/Time   CHOL 120 07/31/2019 1339   TRIG 242 (H) 07/31/2019 1339   HDL 42 (L) 07/31/2019 1339   CHOLHDL 2.9 07/31/2019 1339   VLDL 38.2 07/18/2018 0940   LDLCALC 48 07/31/2019 1339    Assessment/Plan:   T2DM is not controlled likely due to financial barriers and patient not having access to medications. Medication adherence appears optimal. Patient unable to afford Novolin 70/30 and Ozempic at the time being. Will have patient fill out patient assistance paperwork through 01-07-1972 but in the meantime no available samples of 70/30 insulin therefore will need to break up patient's regimen into basal-bolus. Provided samples of insulin aspart (Fiasp) and insulin glargine (Lantus). Patient total daily dose is 100 units therefore will have patient initiate 40 units of Lantus once daily (40%) and the remaining 60 units will be divided into 20 units TID with meals for the Fiasp. Patient educated on purpose, proper use and potential adverse effects of Lantus and Fiasp.  Following instruction patient verbalized understanding of treatment plan.    Discontinued Novolin 70/30 50 units BID Gave samples of  basal insulin glargine (Lantus) 40 units once daily.  Gave samples of  rapid insulin aspart (Fiasp) 20 units with meals (2-3x/day depending on if patient skips full meal at dinnertime).  Extensively discussed pathophysiology  of diabetes, dietary effects on blood sugar control, and recommended lifestyle interventions. Counseled on s/sx of and management of hypoglycemia Next A1C anticipated September 2022.   Follow-up appointment one week to review sugar readings. Written patient instructions provided.  This appointment required 30 minutes of direct patient care.  Thank you for  involving pharmacy to assist in providing this patient's care.

## 2021-06-05 NOTE — Patient Instructions (Signed)
[name] it was a pleasure seeing you today.   Please do the following:  STOP 70/30 insulin starting tomorrow and START Lantus 40 units once daily and Fiasp 20 units with meals (breakfast and lunch) as directed today during your appointment. If you have any questions or if you believe something has occurred because of this change, call me or your doctor to let one of [place] know.  Continue checking blood sugars at home. It's really important that you record these and bring these in to your next doctor's appointment.  Continue making the lifestyle changes we've discussed together during our visit. Diet and exercise play a significant role in improving your blood sugars.  Follow-up with me in one week.    Hypoglycemia or low blood sugar:   Low blood sugar can happen quickly and may become an emergency if not treated right away.   While this shouldn't happen often, it can be brought upon if you skip a meal or do not eat enough. Also, if your insulin or other diabetes medications are dosed too high, this can cause your blood sugar to go to low.   Warning signs of low blood sugar include: Feeling shaky or dizzy Feeling weak or tired  Excessive hunger Feeling anxious or upset  Sweating even when you aren't exercising  What to do if I experience low blood sugar? Follow the Rule of 15 Check your blood sugar with your meter. If lower than 70, proceed to step 2.  Treat with 15 grams of fast acting carbs which is found in 3-4 glucose tablets. If none are available you can try hard candy, 1 tablespoon of sugar or honey,4 ounces of fruit juice, or 6 ounces of REGULAR soda.  Re-check your sugar in 15 minutes. If it is still below 70, do what you did in step 2 again. If your blood sugar has come back up, go ahead and eat a snack or small meal made up of complex carbs (ex. Whole grains) and protein at this time to avoid recurrence of low blood sugar.

## 2021-06-05 NOTE — Assessment & Plan Note (Signed)
S/p duloxetine trials brief x2.  Stopped due to anxiety and palpitations.  Trialed Prozac x5 days, stopped due to dizziness, nausea, insomnia.  Prescribed Lexapro 6/29, however patient has not yet started.  Patient interested in re-referral to previous neurosurgeon Dr. Shellia Cleverly for possible injections, referral placed today.  Prescribed low-dose baclofen for muscle spasms as needed.  Continue to recommend anxiety treatment, physical activity, SSRI/SNRI.  If Lexapro unsuccessful, could consider nortriptyline as next prescription trial.

## 2021-06-05 NOTE — Patient Instructions (Signed)
It was wonderful to see you today. Thank you for allowing me to be a part of your care. Below is a short summary of what we discussed at your visit today:  Depression and anxiety - Start the lexapro as prescribed after visiting the pharmacist.   Pain - I placed a referral for you to return to Dr. Petra Kuba - I also placed a referral to physical therapy, which has been shown to improve chronic pain - I have sent a baclofen prescription. Take as needed to muscle spasm.   Hand arthritis - You may take up to 2g tylenol in a single day  Diabetes Meds - We do not have samples of the 70/30 insulin  - Please work with Boykin Reaper the pharmacist to transition to the samples we have here  Please bring all of your medications to every appointment!  If you have any questions or concerns, please do not hesitate to contact us via phone or MyChart message.   Fayette Pho, MD

## 2021-06-05 NOTE — Assessment & Plan Note (Addendum)
PHQ-9 and GAD-7 today unchanged from previous, still very much uncontrolled.  Patient self-discontinued Prozac due to dizziness, nausea, insomnia.  Prescribed Lexapro 6/29, however has not yet started this.  Wanted to wait to talk to clinical pharmacist at appointment today first.  Previously referred to psychiatry by Dr. McDiarmid.

## 2021-06-05 NOTE — Assessment & Plan Note (Signed)
T2DM is not controlled likely due to financial barriers and patient not having access to medications. Medication adherence appears optimal. Patient unable to afford Novolin 70/30 and Ozempic at the time being. Will have patient fill out patient assistance paperwork through [employer] but in the meantime no available samples of 70/30 insulin therefore will need to break up patient's regimen into basal-bolus. Provided samples of insulin aspart (Fiasp) and insulin glargine (Lantus). Patient total daily dose is 100 units therefore will have patient initiate 40 units of Lantus once daily (40%) and the remaining 60 units will be divided into 20 units TID with meals for the Fiasp. Patient educated on purpose, proper use and potential adverse effects of Lantus and Fiasp.  Following instruction patient verbalized understanding of treatment plan.    1. Discontinued Novolin 70/30 50 units BID 2. Gave samples of  basal insulin glargine (Lantus) 40 units once daily.  3. Gave samples of  rapid insulin aspart (Fiasp) 20 units with meals (2-3x/day depending on if patient skips full meal at dinnertime).  4. Extensively discussed pathophysiology of diabetes, dietary effects on blood sugar control, and recommended lifestyle interventions. 5. Counseled on s/sx of and management of hypoglycemia 6. Next A1C anticipated September 2022.

## 2021-06-05 NOTE — Progress Notes (Signed)
Baclofen    SUBJECTIVE:   CHIEF COMPLAINT / HPI:   Depression and anxiety - At our last appointment 6/13, had discontinued duloxetine due to feeling jumpy with worse anxiety, started Prozac with taper up schedule - Saw Dr. Constance Goltz 6/29, endorsed she took Prozac 5 to 6 days before stopping due to side effects of dizziness, nausea, insomnia - 6/29 prescribed Lexapro with taper up schedule, patient has not yet started this -Patient wanted to wait to talk to the clinical pharmacist at appointment today 7/18 prior to starting  Chronic pain  -Feels as though her chronic pain is getting worse - Will have sharp intermittent pains - No pattern, no one starting point -Previously saw a neurosurgeon named Dr. Rockne Menghini at [facility] who suggested injections; at the time, patient had declined but is now considering -Is going on vacation with family next week and is concerned that pain will keep her from enjoying time with her family -Fear of pain gives her great anxiety at last appointment on 6/29  PERTINENT  PMH / PSH: HTN, T2DM, CKD3, hepatic steatosis, GERD, osteoporosis, fibromyalgia, anxiety, depression, legally blind  OBJECTIVE:   BP 140/70   Pulse 84   Ht 5\' 2"  (1.575 m)   Wt 168 lb (76.2 kg)   SpO2 98%   BMI 30.73 kg/m   PHQ-9:  Depression screen [facility] 2/9 06/05/2021 05/25/2021 05/17/2021  Decreased Interest 2 3 2   Down, Depressed, Hopeless 2 2 2   PHQ - 2 Score 4 5 4   Altered sleeping 3 3 3   Tired, decreased energy 2 2 2   Change in appetite 1 1 1   Feeling bad or failure about yourself  2 1 2   Trouble concentrating 1 1 2   Moving slowly or fidgety/restless 0 0 0  Suicidal thoughts 0 0 0  PHQ-9 Score 13 13 14   Difficult doing work/chores - - -  Some recent data might be hidden     GAD-7:  GAD 7 : Generalized Anxiety Score 06/05/2021 09/08/2020 08/11/2020 05/26/2020  Nervous, Anxious, on Edge 3 3 3 3   Control/stop worrying 3 3 3 3   Worry too much - different things 3 3 3 3    Trouble relaxing 3 3 3 3   Restless 0 3 - 1  Easily annoyed or irritable 2 0 0 1  Afraid - awful might happen 3 3 3 1   Total GAD 7 Score 17 18 - 15  Anxiety Difficulty - Very difficult - Somewhat difficult     Physical Exam General: Awake, alert, oriented, no acute distress Respiratory: Unlabored respirations, speaking in full sentences, no respiratory distress Extremities: Moving all extremities spontaneously Neuro: Cranial nerves II through X grossly intact Psych: Normal insight and judgement   ASSESSMENT/PLAN:   Mood disorder (HCC) PHQ-9 and GAD-7 today unchanged from previous, still very much uncontrolled.  Patient self-discontinued Prozac due to dizziness, nausea, insomnia.  Prescribed Lexapro 6/29, however has not yet started this.  Wanted to wait to talk to clinical pharmacist at appointment today first.  Previously referred to psychiatry by Dr. McDiarmid.   Fibromyalgia S/p duloxetine trials brief x2.  Stopped due to anxiety and palpitations.  Trialed Prozac x5 days, stopped due to dizziness, nausea, insomnia.  Prescribed Lexapro 6/29, however patient has not yet started.  Patient interested in re-referral to previous neurosurgeon Dr. for possible injections, referral placed today.  Prescribed low-dose baclofen for muscle spasms as needed.  Continue to recommend anxiety treatment, physical activity, SSRI/SNRI.  If Lexapro unsuccessful, could consider  nortriptyline as next prescription trial.     Fayette Pho, MD [facility]

## 2021-06-08 ENCOUNTER — Emergency Department (HOSPITAL_BASED_OUTPATIENT_CLINIC_OR_DEPARTMENT_OTHER)
Admission: EM | Admit: 2021-06-08 | Discharge: 2021-06-08 | Disposition: A | Discharge status: Home / Self Care | Payer: Medicare (Managed Care) | Attending: Emergency Medicine | Admitting: Emergency Medicine

## 2021-06-08 ENCOUNTER — Other Ambulatory Visit: Payer: Self-pay

## 2021-06-08 ENCOUNTER — Encounter (HOSPITAL_BASED_OUTPATIENT_CLINIC_OR_DEPARTMENT_OTHER): Payer: Self-pay | Admitting: Emergency Medicine

## 2021-06-08 ENCOUNTER — Emergency Department (HOSPITAL_BASED_OUTPATIENT_CLINIC_OR_DEPARTMENT_OTHER): Payer: Medicare (Managed Care) | Admitting: Radiology

## 2021-06-08 ENCOUNTER — Ambulatory Visit: Payer: Medicare (Managed Care) | Admitting: Licensed Clinical Social Worker

## 2021-06-08 DIAGNOSIS — E1142 Type 2 diabetes mellitus with diabetic polyneuropathy: Secondary | ICD-10-CM | POA: Insufficient documentation

## 2021-06-08 DIAGNOSIS — E1122 Type 2 diabetes mellitus with diabetic chronic kidney disease: Secondary | ICD-10-CM | POA: Insufficient documentation

## 2021-06-08 DIAGNOSIS — E1169 Type 2 diabetes mellitus with other specified complication: Secondary | ICD-10-CM | POA: Insufficient documentation

## 2021-06-08 DIAGNOSIS — Z87891 Personal history of nicotine dependence: Secondary | ICD-10-CM | POA: Insufficient documentation

## 2021-06-08 DIAGNOSIS — Z7984 Long term (current) use of oral hypoglycemic drugs: Secondary | ICD-10-CM | POA: Insufficient documentation

## 2021-06-08 DIAGNOSIS — Z79899 Other long term (current) drug therapy: Secondary | ICD-10-CM | POA: Insufficient documentation

## 2021-06-08 DIAGNOSIS — I129 Hypertensive chronic kidney disease with stage 1 through stage 4 chronic kidney disease, or unspecified chronic kidney disease: Secondary | ICD-10-CM | POA: Diagnosis not present

## 2021-06-08 DIAGNOSIS — S99922A Unspecified injury of left foot, initial encounter: Secondary | ICD-10-CM | POA: Diagnosis present

## 2021-06-08 DIAGNOSIS — J45909 Unspecified asthma, uncomplicated: Secondary | ICD-10-CM | POA: Diagnosis not present

## 2021-06-08 DIAGNOSIS — E785 Hyperlipidemia, unspecified: Secondary | ICD-10-CM | POA: Insufficient documentation

## 2021-06-08 DIAGNOSIS — S93602A Unspecified sprain of left foot, initial encounter: Secondary | ICD-10-CM | POA: Diagnosis not present

## 2021-06-08 DIAGNOSIS — N183 Chronic kidney disease, stage 3 unspecified: Secondary | ICD-10-CM | POA: Diagnosis not present

## 2021-06-08 DIAGNOSIS — Z8673 Personal history of transient ischemic attack (TIA), and cerebral infarction without residual deficits: Secondary | ICD-10-CM | POA: Diagnosis not present

## 2021-06-08 DIAGNOSIS — Z794 Long term (current) use of insulin: Secondary | ICD-10-CM | POA: Insufficient documentation

## 2021-06-08 DIAGNOSIS — Z7189 Other specified counseling: Secondary | ICD-10-CM

## 2021-06-08 DIAGNOSIS — W010XXA Fall on same level from slipping, tripping and stumbling without subsequent striking against object, initial encounter: Secondary | ICD-10-CM | POA: Insufficient documentation

## 2021-06-08 NOTE — ED Provider Notes (Signed)
[facility] EMERGENCY DEPT Provider Note   CSN: [id number] Arrival date & time: 06/08/21  0736     History Chief Complaint  Patient presents with   Ankle Pain    Cassandra Reid is a 65 y.o. female.  HPI 65 year old female presents with left foot pain.  She fell and twisted her foot about 3 days ago.  Has been having lateral foot pain and some swelling.  She has been applying ice to it.  She is able to walk but mostly is using her heel due to the pain.  Has chronic neuropathy but no new numbness.  Otherwise has not take anything new for pain.  No ankle pain or swelling.  Past Medical History:  Diagnosis Date   Allergic rhinitis 09/26/2018   Anxiety    Arthritis    Asthma    Brain aneurysm 07/18/2018   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 difficile colitis 07/18/2018   Cerebral aneurysm without rupture    Cerebral aneurysm without rupture    Chest pain 03/26/2020   Chronic kidney disease    CKD stage 3 due to type 2 diabetes mellitus ([place]) 04/15/2019   Depression    Diabetes mellitus without complication (HCC)    Fibromyalgia    Former smoker    GERD (gastroesophageal reflux disease)    Heart murmur    History of chicken pox    History of colon polyps    Hyperlipidemia associated with type 2 diabetes mellitus ([place]) 07/18/2018   Hypertension    Hypertension associated with diabetes ([place]) 07/18/2018   IBS (irritable bowel syndrome)    Ischemic brain injury 05/29/2021   Lacunar stroke ([place]) 05/25/2021   Lacunar stroke, History of ([place]) 05/25/2021   Brain MRI 08/25/21: Few small remote lacunar infarcts noted at the right caudate and left internal capsule   Legally blind    Migraines    Myalgia 04/23/2019   Paroxysmal atrial tachycardia ([place])    On telemetry   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   Type 2 diabetes mellitus  with hyperglycemia, with long-term current use of insulin ([place]) 12/03/2019    Patient Active Problem List   Diagnosis Date Noted   Obesity (BMI 30.0-34.9) 05/25/2021   Anosmia 05/25/2021   Encounter for competency evaluation 05/25/2021   Smoking history 03/29/2021   Osteoporosis 01/18/2021   Foot pain, left 12/09/2020   Encephalomalacia on imaging study 08/25/2020   Fatty liver 05/31/2020   Chronic pain syndrome 04/23/2019   DDD (degenerative disc disease), cervical 04/16/2019   CKD stage 3 due to type 2 diabetes mellitus ([place]) 04/15/2019   Hypertension associated with diabetes ([place]) 07/18/2018   GERD (gastroesophageal reflux disease) 07/18/2018   OSA (obstructive sleep apnea) 07/18/2018   Type 2 diabetes mellitus with diabetic polyneuropathy, with long-term current use of insulin ([place]) 07/18/2018   Fibromyalgia 07/18/2018   Mood disorder ([place]) 07/18/2018   Hyperlipidemia associated with type 2 diabetes mellitus ([place]) 07/18/2018   Legally blind 09/01/2003    Past Surgical History:  Procedure Laterality Date   ABDOMINAL HYSTERECTOMY  2012   APPENDECTOMY  1972   CEREBRAL ANEURYSM REPAIR Left 2004   Aneurysm clip in place near the level of the left ICA on Brain MRI 08/2020. Also coiling performed   CHOLECYSTECTOMY  2015   CRANIOTOMY Left    left pterional craniotomy  MENISCUS REPAIR Bilateral      OB History   No obstetric history on file.     Family History  Problem Relation Age of Onset   Alcohol abuse Mother    Diabetes Mother    Hypertension Mother    Kidney disease Mother    COPD Father    Diabetes Father    Early death Father    Heart disease Father    Hyperlipidemia Father    Hypertension Father    Diabetes Sister    Hypertension Sister    Breast cancer Sister    Cancer Maternal Grandmother    Alcohol abuse Maternal Grandfather    Cancer Maternal Grandfather    Cancer Paternal Grandmother    Cancer Paternal Grandfather    Diabetes Sister     Hypertension Sister    Kidney disease Sister     Social History   Tobacco Use   Smoking status: Former    Packs/day: 1.00    Years: 3.00    Pack years: 3.00    Types: Cigarettes    Quit date: 06/1967    Years since quitting: 54.0   Smokeless tobacco: Never  Vaping Use   Vaping Use: Never used  Substance Use Topics   Alcohol use: Never   Drug use: Never    Home Medications Prior to Admission medications   Medication Sig Start Date End Date Taking? Authorizing Provider  Ascorbic Acid (VITAMIN C PO) Take 1 tablet by mouth daily.   Yes [provider]  atorvastatin (LIPITOR) 40 MG tablet Take 1 tablet (40 mg total) by mouth at bedtime. 03/08/21  Yes Ezequiel Essex, MD  fluticasone [facility]) 50 MCG/ACT nasal spray Place 1 spray into both nostrils daily as needed for allergies or rhinitis.   Yes [provider]  furosemide (LASIX) 40 MG tablet Take 1 tablet (40 mg total) by mouth daily. 03/08/21  Yes Ezequiel Essex, MD  gabapentin (NEURONTIN) 300 MG capsule TAKE 1 TO 2 CAPSULES THREE TIMES DAILY Patient taking differently: 600 mg 3 (three) times daily. 06/29/20  Yes Emeterio Reeve, DO  insulin aspart (FIASP FLEXTOUCH) 100 UNIT/ML FlexTouch Pen Inject 20 Units into the skin with breakfast, with lunch, and with evening meal. 06/05/21  Yes Hensel, Jamal Collin, MD  lisinopril (ZESTRIL) 5 MG tablet Take 1 tablet (5 mg total) by mouth 2 (two) times daily. 03/28/21  Yes Ezequiel Essex, MD  LORazepam (ATIVAN) 0.5 MG tablet TAKE 1 TABLET BY MOUTH EVERY DAY AS NEEDED FOR ANXIETY 05/18/21  Yes Ezequiel Essex, MD  magnesium oxide (MAG-OX) 400 (241.3 Mg) MG tablet Take 1 tablet (400 mg total) by mouth 2 (two) times daily. 03/27/20  Yes Regalado, [name], MD  metFORMIN (GLUCOPHAGE-XR) 750 MG 24 hr tablet Take 1 tablet (750 mg total) by mouth 2 (two) times daily with a meal. 03/08/21  Yes Ezequiel Essex, MD  Multiple Vitamin (MULTIVITAMIN PO) Take 1 tablet by mouth daily.   Yes  [provider]  potassium chloride SA (KLOR-CON) 20 MEQ tablet Take 1 tablet (20 mEq total) by mouth 2 (two) times daily. 01/18/21  Yes Ezequiel Essex, MD  Semaglutide,0.25 or 0.5MG/DOS, (OZEMPIC, 0.25 OR 0.5 MG/DOSE,) 2 MG/1.5ML SOPN Inject 0.5 mg into the skin once a week. Patient taking differently: Inject 0.25 mg into the skin once a week. 12/09/20  Yes Ezequiel Essex, MD  sucralfate (CARAFATE) 1 g tablet Take 1 tablet (1 g total) by mouth 4 (four) times daily -  with meals and at  bedtime. 03/30/21  Yes Tegeler, Gwenyth Allegra, MD  baclofen (LIORESAL) 10 MG tablet Take 1 tablet (10 mg total) by mouth 2 (two) times daily as needed for muscle spasms. 06/05/21   Ezequiel Essex, MD  BD PEN NEEDLE NANO 2ND GEN 32G X 4 MM MISC USE WITH INSULIN 02/21/21   Ezequiel Essex, MD  dapagliflozin propanediol (FARXIGA) 5 MG TABS tablet Take 2 tablets (10 mg total) by mouth daily. Patient not taking: No sig reported 02/28/21   Ezequiel Essex, MD  escitalopram (LEXAPRO) 5 MG tablet Take 1 tablet (5 mg total) by mouth daily. 05/17/21   Benay Pike, MD  famotidine (PEPCID) 20 MG tablet Take 1 tablet (20 mg total) by mouth 2 (two) times daily for 15 days. 03/29/21 04/13/21  Ezequiel Essex, MD  insulin glargine (LANTUS) 100 unit/mL SOPN Inject 40 Units into the skin daily. 06/05/21   [name], MD  Lancet Devices (ONE TOUCH DELICA LANCING DEV) MISC Please use to check blood sugar up to 4 times daily. E11.42 01/31/21   Ezequiel Essex, MD  Lancets Misc. (ACCU-CHEK SOFTCLIX LANCET DEV) KIT Use as instructed to check blood sugar 4 times daily. Dx E09.23 01/18/21   Ezequiel Essex, MD  OneTouch Delica Lancets 30Q MISC Please use to check blood sugar up to 4 times daily. E11.42 01/31/21   Ezequiel Essex, MD  traZODone (DESYREL) 50 MG tablet Take 0.5 tablets (25 mg total) by mouth at bedtime as needed for sleep. 05/17/21   Benay Pike, MD    Allergies    Patient has no known allergies.  Review of Systems    Review of Systems  Musculoskeletal:  Positive for arthralgias and joint swelling.  Neurological:  Negative for weakness and numbness.   Physical Exam Updated Vital Signs BP (!) 159/78 (BP Location: Left Arm)   Pulse 90   Temp 98.3 F (36.8 C) (Oral)   Resp 17   Ht 5' 1" (1.549 m)   Wt 74.4 kg   SpO2 97%   BMI 30.99 kg/m   Physical Exam Vitals and nursing note reviewed.  Constitutional:      Appearance: She is well-developed.  HENT:     Head: Normocephalic and atraumatic.     Right Ear: External ear normal.     Left Ear: External ear normal.     Nose: Nose normal.  Eyes:     General:        Right eye: No discharge.        Left eye: No discharge.  Cardiovascular:     Rate and Rhythm: Normal rate and regular rhythm.     Pulses:          Dorsalis pedis pulses are 2+ on the left side.  Pulmonary:     Effort: Pulmonary effort is normal.  Abdominal:     General: There is no distension.  Musculoskeletal:     Left ankle: No swelling or ecchymosis. No tenderness. Normal range of motion.     Left Achilles Tendon: No tenderness or defects.     Left foot: Tenderness present.       Feet:  Skin:    General: Skin is warm and dry.  Neurological:     Mental Status: She is alert.  Psychiatric:        Mood and Affect: Mood is not anxious.    ED Results / Procedures / Treatments   Labs (all labs ordered are listed, but only abnormal results are displayed) Labs  Reviewed - No data to display  EKG None  Radiology DG Foot Complete Left  Result Date: 06/08/2021 CLINICAL DATA:  Foot injury. EXAM: LEFT FOOT - COMPLETE 3+ VIEW COMPARISON:  12/09/2020. FINDINGS: There is no evidence of fracture or dislocation. There is no evidence of arthropathy or other focal bone abnormality. Soft tissues are unremarkable. IMPRESSION: No acute abnormality. Electronically Signed   By: Marcello Moores  Register   On: 06/08/2021 08:21    Procedures Procedures   Medications Ordered in ED Medications -  No data to display  ED Course  I have reviewed the triage vital signs and the nursing notes.  Pertinent labs & imaging results that were available during my care of the patient were reviewed by me and considered in my medical decision making (see chart for details).    MDM Rules/Calculators/A&P                           X-rays have been personally reviewed and are negative.  Doubt occult fracture.  This is most consistent with a sprain and will give a postop shoe for comfort and recommend Tylenol and ice.  She declines pain meds here.   Final Clinical Impression(s) / ED Diagnoses Final diagnoses:  Foot sprain, left, initial encounter    Rx / DC Orders ED Discharge Orders     None        Sherwood Gambler, MD 06/08/21 [phone]

## 2021-06-08 NOTE — Chronic Care Management (AMB) (Signed)
Care Management Clinical Social Work Note  06/08/2021 Name: Cassandra Reid MRN: [id number] DOB: Sep 23, 1956  Cassandra Reid is a 65 y.o. year old female who is a primary care patient of Cassandra Essex, MD.  The Care Management team was consulted for assistance with chronic disease management and coordination needs.  Engaged with patient by telephone for initial visit in response to provider referral for social work chronic care management and care coordination services  Consent to Services:  [name] was given information about Care Management services today including:  Care Management services includes personalized support from designated clinical staff supervised by her physician, including individualized plan of care and coordination with other care providers 24/7 contact phone numbers for assistance for urgent and routine care needs. The patient may stop case management services at any time by phone call to the office staff.  Patient agreed to services and consent obtained.   Assessment: Patient is currently experiencing difficulty with connecting to mental health provider due to unable to afford $50.00 co-pay per visit. Collaborated with [facility].  Patient is unable to pay the required $150 before they will scheduled the appointment.  She is looking to change insurance providers but this may not happen until the first of the year.   She has not started taking Trazadone or Lexapro as she is going on vacation with her family and does not want start a new medication away from home. ( LCSW will inform Dr. McDiarmid via routing note) See Care Plan below for interventions and patient self-care actives.  Recent life changes /stressors: separated from husband of 46 years; concerns with insurance;   Recommendation: Patient may benefit from, and is in agreement to start medication when she returns from vacation next week; allow LCSW to make referral for supportive therapies with [facility].    Follow up Plan: Patient would like continued follow-up from CCM LCSW .  Follow up scheduled in 2 weeks. Patient will call office if needed prior to next encounter.   Review of patient past medical history, allergies, medications, and health status, including review of relevant consultants reports was performed today as part of a comprehensive evaluation and provision of chronic care management and care coordination services.  SDOH (Social Determinants of Health) assessments and interventions performed:    Advanced Directives Status: See Vynca application for related entries.  Care Plan  No Known Allergies  Outpatient Encounter Medications as of 06/08/2021  Medication Sig Note   Ascorbic Acid (VITAMIN C PO) Take 1 tablet by mouth daily.    atorvastatin (LIPITOR) 40 MG tablet Take 1 tablet (40 mg total) by mouth at bedtime.    baclofen (LIORESAL) 10 MG tablet Take 1 tablet (10 mg total) by mouth 2 (two) times daily as needed for muscle spasms.    BD PEN NEEDLE NANO 2ND GEN 32G X 4 MM MISC USE WITH INSULIN    dapagliflozin propanediol (FARXIGA) 5 MG TABS tablet Take 2 tablets (10 mg total) by mouth daily. (Patient not taking: No sig reported) 03/14/2021: Hasn't started yet.   escitalopram (LEXAPRO) 5 MG tablet Take 1 tablet (5 mg total) by mouth daily.    famotidine (PEPCID) 20 MG tablet Take 1 tablet (20 mg total) by mouth 2 (two) times daily for 15 days.    fluticasone (FLONASE) 50 MCG/ACT nasal spray Place 1 spray into both nostrils daily as needed for allergies or rhinitis.    furosemide (LASIX) 40 MG tablet Take 1 tablet (40 mg total)  by mouth daily.    gabapentin (NEURONTIN) 300 MG capsule TAKE 1 TO 2 CAPSULES THREE TIMES DAILY (Patient taking differently: 600 mg 3 (three) times daily.)    insulin aspart (FIASP FLEXTOUCH) 100 UNIT/ML FlexTouch Pen Inject 20 Units into the skin with breakfast, with lunch, and with evening meal.    insulin glargine (LANTUS) 100 unit/mL SOPN Inject 40  Units into the skin daily.    Lancet Devices (ONE TOUCH DELICA LANCING DEV) MISC Please use to check blood sugar up to 4 times daily. E11.42    Lancets Misc. (ACCU-CHEK SOFTCLIX LANCET DEV) KIT Use as instructed to check blood sugar 4 times daily. Dx E11.42    lisinopril (ZESTRIL) 5 MG tablet Take 1 tablet (5 mg total) by mouth 2 (two) times daily.    LORazepam (ATIVAN) 0.5 MG tablet TAKE 1 TABLET BY MOUTH EVERY DAY AS NEEDED FOR ANXIETY    magnesium oxide (MAG-OX) 400 (241.3 Mg) MG tablet Take 1 tablet (400 mg total) by mouth 2 (two) times daily.    metFORMIN (GLUCOPHAGE-XR) 750 MG 24 hr tablet Take 1 tablet (750 mg total) by mouth 2 (two) times daily with a meal.    Multiple Vitamin (MULTIVITAMIN PO) Take 1 tablet by mouth daily.    OneTouch Delica Lancets 80X MISC Please use to check blood sugar up to 4 times daily. E11.42    potassium chloride SA (KLOR-CON) 20 MEQ tablet Take 1 tablet (20 mEq total) by mouth 2 (two) times daily. 03/16/2021: Half tablet in the morning, half in the evening.    Semaglutide,0.25 or 0.5MG /DOS, (OZEMPIC, 0.25 OR 0.5 MG/DOSE,) 2 MG/1.5ML SOPN Inject 0.5 mg into the skin once a week. (Patient taking differently: Inject 0.25 mg into the skin once a week.)    sucralfate (CARAFATE) 1 g tablet Take 1 tablet (1 g total) by mouth 4 (four) times daily -  with meals and at bedtime.    traZODone (DESYREL) 50 MG tablet Take 0.5 tablets (25 mg total) by mouth at bedtime as needed for sleep.    No facility-administered encounter medications on file as of 06/08/2021.    Patient Active Problem List   Diagnosis Date Noted   Obesity (BMI 30.0-34.9) 05/25/2021   Anosmia 05/25/2021   Encounter for competency evaluation 05/25/2021   Smoking history 03/29/2021   Osteoporosis 01/18/2021   Foot pain, left 12/09/2020   Encephalomalacia on imaging study 08/25/2020   Fatty liver 05/31/2020   Chronic pain syndrome 04/23/2019   DDD (degenerative disc disease), cervical 04/16/2019   CKD  stage 3 due to type 2 diabetes mellitus ([place]) 04/15/2019   Hypertension associated with diabetes ([place]) 07/18/2018   GERD (gastroesophageal reflux disease) 07/18/2018   OSA (obstructive sleep apnea) 07/18/2018   Type 2 diabetes mellitus with diabetic polyneuropathy, with long-term current use of insulin ([place]) 07/18/2018   Fibromyalgia 07/18/2018   Mood disorder ([place]) 07/18/2018   Hyperlipidemia associated with type 2 diabetes mellitus ([place]) 07/18/2018   Legally blind 09/01/2003    Conditions to be addressed/monitored: Mental Health Concerns   Care Plan : General Social Work (Adult)  Updates made by Maurine Cane, LCSW since 06/08/2021 12:00 AM   Problem: Emotional Distress    Goal: Emotional Health Supported   Start Date: 06/08/2021  This Visit's Progress: On track  Priority: High  Current barriers:   Financial constraints related to fixed income and unable pay co-pays, Limited social support, and Mental Health Concerns  Needs Support, Education, and Care Coordination in order  to meet unmet mental health needs. Clinical Goal(s): patient will work with SW to address concerns related to stress patient will work with agencies discussed today to address needs related to managing her mental health   Clinical Interventions:  Assessed patient's previous and current treatment, coping skills, support system and barriers to care  Review various resources, discussed options and provided patient information about Department of Social Services (food stamps, [employer], utilities assistance) Solution-Focused Strategies, Mindfulness or Psychologist, educational, Active listening / Reflection utilized , Emotional Supportive Provided, Problem Solving [place] , Reviewed mental health medications with patient and discussed compliance:  , Participation in counseling encouraged , and Provided EMMI education information on relieving stress  ; Discussed several options for long term counseling based on need and  insurance. Assisted patient with narrowing the options down to ([facility]  ): Referral placed via [place] with PCP regarding development and update of comprehensive plan of care as evidenced by provider attestation and co-signature Inter-disciplinary care team collaboration (see longitudinal plan of care) Patient Goals/Self-Care Activities: Over the next 30 days I have placed a referral  with [facility] for counseling with [facility]  Review your EMMI educational information video on Relieving Stress Call  [facility]) Coordinator for information to help you select an insurance plan [place], [place] / [place]   [phone] 2:56 PM

## 2021-06-08 NOTE — ED Triage Notes (Signed)
Pt turned her left ankle 3 days ago when walking. Pt did not fall. Pt stated she continues to experience intermittent pain in her foot and ankle . No pain at rest and 6 out of 10 with exertion. However, Pt. Can ambulate on same.

## 2021-06-08 NOTE — Patient Instructions (Signed)
Licensed Clinical Social Worker Visit Information  Goals we discussed today:   Goals Addressed             This Visit's Progress    Coping Skills Enhanced       Patient Goals/Self-Care Activities: Over the next 30 days I have placed a referral  with [facility] for counseling with [facility]  Review your EMMI educational information video on Relieving Stress Call  [employer] Information Program [facility]) Coordinator for information to help you select an insurance plan [phone]       Cassandra Reid was given information about Care Management services today including:  Care Management services include personalized support from designated clinical staff supervised by her physician, including individualized plan of care and coordination with other care providers 24/7 contact phone numbers for assistance for urgent and routine care needs. The patient may stop Care Management services at any time by phone call to the office staff.  Patient agreed to services and verbal consent obtained.   Patient verbalizes understanding of instructions provided today and agrees to view in MyChart.   Follow up plan: Appointment scheduled for SW follow up with client by phone on: 06/22/2021   Cassandra Hines, LCSW Care Management & Coordination  [facility]   [phone] 3:01 PM

## 2021-06-11 ENCOUNTER — Other Ambulatory Visit: Payer: Self-pay | Admitting: Family Medicine

## 2021-06-13 ENCOUNTER — Telehealth (INDEPENDENT_AMBULATORY_CARE_PROVIDER_SITE_OTHER): Payer: Medicare (Managed Care) | Admitting: Pharmacist

## 2021-06-13 DIAGNOSIS — Z794 Long term (current) use of insulin: Secondary | ICD-10-CM

## 2021-06-13 DIAGNOSIS — E1142 Type 2 diabetes mellitus with diabetic polyneuropathy: Secondary | ICD-10-CM

## 2021-06-13 NOTE — Progress Notes (Signed)
Subjective:    Patient ID: Cassandra Reid, female    DOB: 1956/03/10, 65 y.o.   MRN: [id number]  HPI Patient is a 65 y.o. female who presents for diabetes management. She is in good spirits and presents via MyChart Video Visit. Patient located at vacation home and I was located at the [facility] Patient was referred on 05/18/21 and last seen by Primary Care Provider and in pharmacy clinic on 718/22.   At last appointment patient had just run out of Ozempic and was switched from her Novolin 70/30 to samples of Lantus and Fiasp due to cost of being in coverage gap. Patient reports no issues with insulin switch and feels her blood sugars have been good.   Insurance coverage/medication affordability: [employer] Medicare  Current diabetes medications include: semaglutide (Ozempic) 0.5mg  once weekly (ran out; awaiting patient assistance), insulin glargine (Lantus) 40 units once daily, insulin aspart (Fiasp) 20 units with meals  Patient states that She is taking her medications as prescribed. However, she reports she does not consistently take her Marcelline Deist 5mg  because she did not realize she was supposed to. Patient reports adherence with medications.  Do you feel that your medications are working for you?  Yes when she was on both the Ozempic and insulin  Have you been experiencing any side effects to the medications prescribed? no  Do you have any problems obtaining medications due to transportation or finances?  Yes; currently in donut hole and unable to afford insulin and Ozempic   Patient denies hypoglycemic events. Patient denies polyuria (increased urination).  Patient denies polyphagia (increased appetite).  Patient denies polydipsia (increased thirst).  Patient denies neuropathy (nerve pain). Patient denies visual changes. Patient reports self foot exams.   Home fasting blood sugars: range from 140-215   Objective:   Labs:   Physical Exam Neurological:     Mental  Status: She is alert and oriented to person, place, and time.    Review of Systems  Gastrointestinal:  Negative for nausea and vomiting.   Lab Results  Component Value Date   HGBA1C 7.9 (A) 05/01/2021   HGBA1C 9.9 (H) 12/09/2020   HGBA1C 9.6 (A) 11/10/2020    Lab Results  Component Value Date   MICRALBCREAT 30-300 11/10/2020    Lipid Panel     Component Value Date/Time   CHOL 120 07/31/2019 1339   TRIG 242 (H) 07/31/2019 1339   HDL 42 (L) 07/31/2019 1339   CHOLHDL 2.9 07/31/2019 1339   VLDL 38.2 07/18/2018 0940   LDLCALC 48 07/31/2019 1339    Assessment/Plan:   T2DM is not controlled likely due to financial barriers and patient not having access to medications. Medication adherence appears optimal. Due to patient being unable to currently obtain Ozempic and her fasting blood glucose readings being above goal will increase Lantus from 40 to 42 units once daily (5% increase) with instructions for patient to resume taking 09/30/2019 5mg  once daily. Discussed potential for CGM with patient and she expressed interest. Will submit to DME supplier for further review. Following instruction patient verbalized understanding of treatment plan.    Increased dose of basal insulin glargine (Lantus) to 42 units once daily.  Continued rapid insulin aspart (Fiasp) 20 units with meals (2-3x/day depending on if patient skips full meal at dinnertime).  Extensively discussed pathophysiology of diabetes, dietary effects on blood sugar control, and recommended lifestyle interventions. Counseled on s/sx of and management of hypoglycemia Sent in [place] application to DME supplier via parachute. Next  A1C anticipated September 2022.   Follow-up appointment 3-4 weeks to review sugar readings. Written patient instructions provided.  This appointment required 29 minutes of direct patient care.  Thank you for involving pharmacy to assist in providing this patient's care.

## 2021-06-13 NOTE — Patient Instructions (Signed)
[name] it was a pleasure seeing you today.   Please do the following:  Increase Lantus to 42 units as directed today during your appointment. If you have any questions or if you believe something has occurred because of this change, call me or your doctor to let one of [place] know.  Continue checking blood sugars at home. It's really important that you record these and bring these in to your next doctor's appointment.  Continue making the lifestyle changes we've discussed together during our visit. Diet and exercise play a significant role in improving your blood sugars.  Follow-up with me in 3-4 weeks.    Hypoglycemia or low blood sugar:   Low blood sugar can happen quickly and may become an emergency if not treated right away.   While this shouldn't happen often, it can be brought upon if you skip a meal or do not eat enough. Also, if your insulin or other diabetes medications are dosed too high, this can cause your blood sugar to go to low.   Warning signs of low blood sugar include: Feeling shaky or dizzy Feeling weak or tired  Excessive hunger Feeling anxious or upset  Sweating even when you aren't exercising  What to do if I experience low blood sugar? Follow the Rule of 15 Check your blood sugar with your meter. If lower than 70, proceed to step 2.  Treat with 15 grams of fast acting carbs which is found in 3-4 glucose tablets. If none are available you can try hard candy, 1 tablespoon of sugar or honey,4 ounces of fruit juice, or 6 ounces of REGULAR soda.  Re-check your sugar in 15 minutes. If it is still below 70, do what you did in step 2 again. If your blood sugar has come back up, go ahead and eat a snack or small meal made up of complex carbs (ex. Whole grains) and protein at this time to avoid recurrence of low blood sugar.

## 2021-06-13 NOTE — Assessment & Plan Note (Signed)
T2DM is not controlled likely due to financial barriers and patient not having access to medications. Medication adherence appears optimal. Due to patient being unable to currently obtain Ozempic and her fasting blood glucose readings being above goal will increase Lantus from 40 to 42 units once daily (5% increase) with instructions for patient to resume taking Farxiga 5mg  once daily. Discussed potential for CGM with patient and she expressed interest. Will submit to DME supplier for further review. Following instruction patient verbalized understanding of treatment plan.    1. Increased dose of basal insulin glargine (Lantus) to 42 units once daily.  2. Continued rapid insulin aspart (Fiasp) 20 units with meals (2-3x/day depending on if patient skips full meal at dinnertime).  3. Extensively discussed pathophysiology of diabetes, dietary effects on blood sugar control, and recommended lifestyle interventions. 4. Counseled on s/sx of and management of hypoglycemia 5. Sent in [place] application to DME supplier via parachute. 6. Next A1C anticipated September 2022.   Follow-up appointment 3-4 weeks to review sugar readings. Written patient instructions provided.  This appointment required 29 minutes of direct patient care.

## 2021-06-19 NOTE — Progress Notes (Signed)
I discussed the limitations of evaluation and management by telemedicine and the availability of in person appointments. The patient expressed understanding and agreed to proceed

## 2021-06-22 ENCOUNTER — Telehealth: Payer: Medicare (Managed Care)

## 2021-06-25 ENCOUNTER — Encounter: Payer: Self-pay | Admitting: Family Medicine

## 2021-06-25 DIAGNOSIS — M797 Fibromyalgia: Secondary | ICD-10-CM

## 2021-06-25 DIAGNOSIS — G894 Chronic pain syndrome: Secondary | ICD-10-CM

## 2021-06-26 ENCOUNTER — Ambulatory Visit: Payer: Medicare (Managed Care) | Admitting: Licensed Clinical Social Worker

## 2021-06-26 DIAGNOSIS — Z7189 Other specified counseling: Secondary | ICD-10-CM

## 2021-06-26 MED ORDER — GABAPENTIN 600 MG PO TABS: 300.0000 mg | ORAL_TABLET | Freq: Three times a day (TID) | ORAL | 3 refills | Status: DC

## 2021-06-26 NOTE — Chronic Care Management (AMB) (Signed)
Care Management   Clinical Social Work Note  06/26/2021 Name: Cassandra Reid MRN: [id number] DOB: 1956-11-10  Cassandra Reid is a 65 y.o. year old female who is a primary care patient of Ezequiel Essex, MD. The CCM team was consulted to assist the patient with chronic disease management and/or care coordination needs related to: [employer]  and [place] and Resources.   Engaged with patient by telephone for follow up visit in response to provider referral for social work chronic care management and care coordination services.   Consent to Services:  The patient was given information about Chronic Care Management services, agreed to services, and gave verbal consent prior to initiation of services.  Please see initial visit note for detailed documentation.   Patient agreed to services and consent obtained.   Assessment: Patient is engaged in conversation  She is making progress with connecting to [facility] for counseling and has started taking Lexapro.  . See Care Plan below for interventions and patient self-care actives.  Recent life changes [place]: She continues to experience difficulty with locating new housing. She is no longer able to afford her current apartment.  Recommendation: Patient may benefit from, and is in agreement to allow LCSW to make [facility] referral for community support for housing and Medicare education.   Also making refer to [facility] for medication evaluation based on recommendation from Dr. McDiarmid.   Follow up Plan: Patient would like continued follow-up from CCM LCSW .  Follow up scheduled in 2 weeks 07/11/2021. Patient will call office if needed prior to next encounter.     Review of patient past medical history, allergies, medications, and health status, including review of relevant consultants reports was performed today as part of a comprehensive evaluation and provision of chronic care management and care  coordination services.     SDOH (Social Determinants of Health) assessments and interventions performed:  SDOH Interventions    Flowsheet Row Most Recent Value  SDOH Interventions   Housing Interventions [id number] Referral  Depression Interventions/Treatment  Referral to Psychiatry, Counseling        Advanced Directives Status: See [place] application for related entries.  CCM Care Plan  No Known Allergies  Outpatient Encounter Medications as of 06/26/2021  Medication Sig Note   Ascorbic Acid (VITAMIN C PO) Take 1 tablet by mouth daily.    atorvastatin (LIPITOR) 40 MG tablet Take 1 tablet (40 mg total) by mouth at bedtime.    baclofen (LIORESAL) 10 MG tablet Take 1 tablet (10 mg total) by mouth 2 (two) times daily as needed for muscle spasms. (Patient not taking: Reported on 06/13/2021)    BD PEN NEEDLE NANO 2ND GEN 32G X 4 MM MISC USE WITH INSULIN    dapagliflozin propanediol (FARXIGA) 5 MG TABS tablet Take 2 tablets (10 mg total) by mouth daily. (Patient not taking: No sig reported) 03/14/2021: Hasn't started yet.   escitalopram (LEXAPRO) 5 MG tablet Take 2 tablets (10 mg total) by mouth daily.    famotidine (PEPCID) 20 MG tablet Take 1 tablet (20 mg total) by mouth 2 (two) times daily for 15 days.    fluticasone (FLONASE) 50 MCG/ACT nasal spray Place 1 spray into both nostrils daily as needed for allergies or rhinitis.    furosemide (LASIX) 40 MG tablet Take 1 tablet (40 mg total) by mouth daily. (Patient not taking: Reported on 06/13/2021)    gabapentin (NEURONTIN) 300 MG capsule TAKE 1 TO 2 CAPSULES THREE TIMES DAILY (  Patient taking differently: 600 mg 3 (three) times daily.)    insulin aspart (FIASP FLEXTOUCH) 100 UNIT/ML FlexTouch Pen Inject 20 Units into the skin with breakfast, with lunch, and with evening meal.    insulin glargine (LANTUS) 100 unit/mL SOPN Inject 40 Units into the skin daily.    Lancet Devices (ONE TOUCH DELICA LANCING DEV) MISC Please use to check blood sugar up  to 4 times daily. E11.42    Lancets Misc. (ACCU-CHEK SOFTCLIX LANCET DEV) KIT Use as instructed to check blood sugar 4 times daily. Dx E11.42    lisinopril (ZESTRIL) 5 MG tablet Take 1 tablet (5 mg total) by mouth 2 (two) times daily.    LORazepam (ATIVAN) 0.5 MG tablet TAKE 1 TABLET BY MOUTH EVERY DAY AS NEEDED FOR ANXIETY    magnesium oxide (MAG-OX) 400 (241.3 Mg) MG tablet Take 1 tablet (400 mg total) by mouth 2 (two) times daily.    metFORMIN (GLUCOPHAGE-XR) 750 MG 24 hr tablet Take 1 tablet (750 mg total) by mouth 2 (two) times daily with a meal.    Multiple Vitamin (MULTIVITAMIN PO) Take 1 tablet by mouth daily.    OneTouch Delica Lancets 76O MISC Please use to check blood sugar up to 4 times daily. E11.42    potassium chloride SA (KLOR-CON) 20 MEQ tablet Take 1 tablet (20 mEq total) by mouth 2 (two) times daily. 03/16/2021: Half tablet in the morning, half in the evening.    Semaglutide,0.25 or 0.5MG/DOS, (OZEMPIC, 0.25 OR 0.5 MG/DOSE,) 2 MG/1.5ML SOPN Inject 0.5 mg into the skin once a week. (Patient not taking: Reported on 06/13/2021)    sucralfate (CARAFATE) 1 g tablet Take 1 tablet (1 g total) by mouth 4 (four) times daily -  with meals and at bedtime.    traZODone (DESYREL) 50 MG tablet Take 0.5 tablets (25 mg total) by mouth at bedtime as needed for sleep. (Patient not taking: Reported on 06/13/2021)    No facility-administered encounter medications on file as of 06/26/2021.    Patient Active Problem List   Diagnosis Date Noted   Obesity (BMI 30.0-34.9) 05/25/2021   Anosmia 05/25/2021   Encounter for competency evaluation 05/25/2021   Smoking history 03/29/2021   Osteoporosis 01/18/2021   Foot pain, left 12/09/2020   Encephalomalacia on imaging study 08/25/2020   Fatty liver 05/31/2020   Chronic pain syndrome 04/23/2019   DDD (degenerative disc disease), cervical 04/16/2019   CKD stage 3 due to type 2 diabetes mellitus ([place]) 04/15/2019   Hypertension associated with diabetes  ([place]) 07/18/2018   GERD (gastroesophageal reflux disease) 07/18/2018   OSA (obstructive sleep apnea) 07/18/2018   Type 2 diabetes mellitus with diabetic polyneuropathy, with long-term current use of insulin ([place]) 07/18/2018   Fibromyalgia 07/18/2018   Mood disorder ([place]) 07/18/2018   Hyperlipidemia associated with type 2 diabetes mellitus ([place]) 07/18/2018   Legally blind 09/01/2003    Conditions to be addressed/monitored: Depression; Housing barriers and [place] : General Social Work (Adult)  Updates made by Maurine Cane, LCSW since 06/26/2021 12:00 AM     Problem: Emotional Distress      Goal: Emotional Health Supported/ connect with mental health provider   Start Date: 06/08/2021  This Visit's Progress: On track  Recent Progress: On track  Priority: High  Note:   Current barriers:   Financial constraints related to fixed income and unable pay co-pays, Limited social support, and Mental Health Concerns  Needs Support, Education, and Care Coordination  in order to meet unmet mental health needs. Clinical Goal(s): patient will work with SW to address concerns related to stress and connecting with mental health provider patient will work with agencies discussed today to address needs related to managing her mental health  Clinical Interventions:  Assessed patient's current treatment, progress, coping skills, support system and barriers to care  Solution-Focused Strategies, Mindfulness or Relaxation Training, Active listening / Reflection utilized , Emotional Supportive Provided, Problem Solving [place] , Reviewed mental health medications with patient and discussed compliance: Patient reports she has started taking Lexapro ( has taken for 3 days), Participation in counseling encouraged , and Provided EMMI education information on relieving stress  ; Reviewed referral placed with [facility] via [place]( they have connected with and  spoken to patient twice) Discussed barriers with connecting to other psychiatrist patient open for new referral to [facility] for medication evaluation. Collaborated with [facility] referral coordinator to place referral  Inter-disciplinary care team collaboration (see longitudinal plan of care)    Patient Goals/Self-Care Activities: Over the next 30 days Continue to work with [facility] for counseling  Review your EMMI educational information video on Relieving Stress Continue taking Lexapro I have placed a new referral to [facility] psychiatry     Problem: SDOH/Needs new housing and community resources      Goal: Coping Skills Enhanced by connecting with community Support   Start Date: 06/26/2021  This Visit's Progress: On track  Priority: High  Note:   Current barriers:    Limited social support, Transportation, Housing barriers, Mental Health Concerns , and Family and relationship dysfunction Clinical Goals: Patient will work with agencies discussed to address needs related to locating new housing and health insurance provider Clinical Interventions:  Inter-disciplinary care team collaboration (see longitudinal plan of care) Assessment of needs, barriers , agencies contacted, as well as how impacting Review various resources, discussed options and provided patient information about  Housing resources [employer] ) Referral for [place] ([employer] and [employer] ) Teaching laboratory technician provided by [employer], Active listening / Reflection utilized , Emotional Supportive Provided, and Veterinary surgeon Patient Goals/Self-Care Activities: Over the next 14 days I have places a referral  with [employer] for Housing with [employer] I also placed the follow up referral with [facility] to assist you with you Medicare insurance options       Casimer Lanius, [place] / [place]   [phone] 10:07 AM

## 2021-06-26 NOTE — Patient Instructions (Signed)
Visit Information   Goals Addressed             This Visit's Progress    Coping Skills Enhanced by connecting with mental health provider   On track    Patient Goals/Self-Care Activities: Over the next 30 days Continue to work with [facility] for counseling  Review your EMMI educational information video on Relieving Stress Continue taking Lexapro I have placed a new referral to [facility] psychiatry      Find Help in My Community   On track    Timeframe:  Short-Term Goal Priority:  High Start Date:   06/26/2021                          Expected End Date:                       Follow Up Date 8/232/2022   Patient Goals/Self-Care Activities: Over the next 14 days I have places a referral  with [facility] for Housing with [employer] I also placed the follow up referral with [facility] to assist you with you Medicare insurance options  Why is this important?   Knowing how and where to find help for yourself or family in your neighborhood and community is an important skill.       Patient verbalizes understanding of instructions provided today and agrees to view in MyChart.   Telephone follow up appointment with care management team member scheduled for:07/11/2021  Sammuel Hines, [facility] Care Management & Coordination  [phone]

## 2021-06-29 ENCOUNTER — Other Ambulatory Visit: Payer: Self-pay | Admitting: Family Medicine

## 2021-06-29 DIAGNOSIS — M797 Fibromyalgia: Secondary | ICD-10-CM

## 2021-06-29 DIAGNOSIS — G894 Chronic pain syndrome: Secondary | ICD-10-CM

## 2021-06-29 DIAGNOSIS — M62838 Other muscle spasm: Secondary | ICD-10-CM

## 2021-07-04 ENCOUNTER — Telehealth: Payer: Medicare (Managed Care) | Admitting: Pharmacist

## 2021-07-06 ENCOUNTER — Telehealth (INDEPENDENT_AMBULATORY_CARE_PROVIDER_SITE_OTHER): Payer: Medicare (Managed Care) | Admitting: Pharmacist

## 2021-07-06 DIAGNOSIS — E1142 Type 2 diabetes mellitus with diabetic polyneuropathy: Secondary | ICD-10-CM

## 2021-07-06 DIAGNOSIS — Z794 Long term (current) use of insulin: Secondary | ICD-10-CM

## 2021-07-06 NOTE — Assessment & Plan Note (Signed)
T2DM is not controlled likely due to financial barriers and patient not having access to medications. Medication adherence appears optimal. Patient will present to clinic tomorrow for CGM sample and to obtain additional samples of Lantus and Fiasp. Will continue patient on dose of Novolin 70/30 50 units BID for today and then will switch back to Lantus and Fiasp starting tomorrow. Will also have patient resume taking [place]. Following instruction patient verbalized understanding of treatment plan.    1. Continued Novolin 70/30 50 units BID for today and will switch tomorrow 2. Starting tomorrow patient will take 38 units of Lantus once daily 3. Starting tomorrow patient will resume rapid insulin aspart (Fiasp) 20 units with meals (2-3x/day depending on if patient skips full meal at dinnertime).  4. Continued metformin XR 750mg  BID 5. Continued Farxiga 5mg  once daily 6. Extensively discussed pathophysiology of diabetes, dietary effects on blood sugar control, and recommended lifestyle interventions. 7. Counseled on s/sx of and management of hypoglycemia 8. Next A1C anticipated September 2022.   Follow-up appointment 2 weeks to review sugar readings. Written patient instructions provided.

## 2021-07-06 NOTE — Progress Notes (Signed)
Subjective:    Patient ID: Cassandra Reid, female    DOB: 07-Apr-1956, 65 y.o.   MRN: [id number]  HPI Patient is a 65 y.o. female who presents for diabetes management. She is in good spirits. Patient last seen in pharmacy clinic on 06/13/21.  I connected with [name] on 07/06/21 at  9:00 AM EDT by MyChart video visit and verified that I am speaking with the correct person using two identifiers.   I discussed the limitations, risks, security and privacy concerns of performing an evaluation and management service by telemedicine and the availability of in-person appointments. I also discussed with the patient that there may be a patient responsible charge related to this service. The patient expressed understanding and agreed to proceed.   Other persons participating in the visit and their role in the encounter: N/A   Patient's location: Home  Provider's location: In office   Patient reports since last she ran out of her Lantus and Fiasp samples provided to her so she picked up a bottle of Novolin 70/30 from [facility] and has been taking 50 units twice a day. She feels this is not controlling her blood glucose as well as she has been mostly in the 200's. She also reports that she did start taking her Marcelline Deist daily but this lead to a few nighttime low's in the 60-70's so she sporadically has been taking the medication.  Insurance coverage/medication affordability: [employer] Medicare  Current diabetes medications include: semaglutide (Ozempic) 0.5mg  once weekly (ran out; awaiting patient assistance), insulin glargine (Lantus) 40 units once daily (ran out; picked up Novolin 70/30 50 units BID), insulin aspart (Fiasp) 20 units with meals (ran out; picked up Novolin 70/30 50 units BID), metformin XR 750 BID  Patient states that She is taking her medications as prescribed. Patient reports adherence with medications.  Do you feel that your medications are working for you?  no  Have you been  experiencing any side effects to the medications prescribed? no  Do you have any problems obtaining medications due to transportation or finances?  Yes; currently in donut hole and unable to afford insulin and Ozempic   Patient denies hypoglycemic events. Patient denies polyuria (increased urination).  Patient denies polyphagia (increased appetite).  Patient denies polydipsia (increased thirst).  Patient denies neuropathy (nerve pain). Patient denies visual changes. Patient reports self foot exams.   Home fasting blood sugars: In the 200's; this morning 298   Objective:   Labs:   Physical Exam Neurological:     Mental Status: She is alert and oriented to person, place, and time.    Review of Systems  Gastrointestinal:  Negative for nausea and vomiting.   Lab Results  Component Value Date   HGBA1C 7.9 (A) 05/01/2021   HGBA1C 9.9 (H) 12/09/2020   HGBA1C 9.6 (A) 11/10/2020    Lab Results  Component Value Date   MICRALBCREAT 30-300 11/10/2020    Lipid Panel     Component Value Date/Time   CHOL 120 07/31/2019 1339   TRIG 242 (H) 07/31/2019 1339   HDL 42 (L) 07/31/2019 1339   CHOLHDL 2.9 07/31/2019 1339   VLDL 38.2 07/18/2018 0940   LDLCALC 48 07/31/2019 1339    Assessment/Plan:   T2DM is not controlled likely due to financial barriers and patient not having access to medications. Medication adherence appears optimal. Patient will present to clinic tomorrow for CGM sample and to obtain additional samples of Lantus and Fiasp. Will continue patient on dose of  Novolin 70/30 50 units BID for today and then will switch back to Lantus and Fiasp starting tomorrow. Will also have patient resume taking [place]. Following instruction patient verbalized understanding of treatment plan.    Continued Novolin 70/30 50 units BID for today and will switch tomorrow Starting tomorrow patient will take 38 units of Lantus once daily Starting tomorrow patient will resume rapid insulin  aspart (Fiasp) 20 units with meals (2-3x/day depending on if patient skips full meal at dinnertime).  Continued metformin XR 750mg  BID Continued Farxiga 5mg  once daily Extensively discussed pathophysiology of diabetes, dietary effects on blood sugar control, and recommended lifestyle interventions. Counseled on s/sx of and management of hypoglycemia Next A1C anticipated September 2022.   Follow-up appointment 2 weeks to review sugar readings. Written patient instructions provided.  This appointment required 27 minutes of direct patient care.  Thank you for involving pharmacy to assist in providing this patient's care.

## 2021-07-07 ENCOUNTER — Ambulatory Visit (INDEPENDENT_AMBULATORY_CARE_PROVIDER_SITE_OTHER): Payer: Medicare (Managed Care) | Admitting: Pharmacist

## 2021-07-07 ENCOUNTER — Other Ambulatory Visit: Payer: Self-pay

## 2021-07-07 DIAGNOSIS — Z794 Long term (current) use of insulin: Secondary | ICD-10-CM

## 2021-07-07 DIAGNOSIS — E1142 Type 2 diabetes mellitus with diabetic polyneuropathy: Secondary | ICD-10-CM

## 2021-07-07 NOTE — Patient Instructions (Addendum)
Ms. Pe it was a pleasure seeing you today.   The sensor is small waterproof disc that is placed on the back of the upper arm.  There is a very thin filament that is inserted under the surface of the skin and measures the amount of glucose in the interstitial fluid.  This system collects your sugar levels for up to 14 days and it automatically records the glucose level every 15 minutes. This will show your provider any patterns in your glucose levels.  Please remember... 1. Sensor will last 14 days 2. Sensor should be applied to area away from scarring, tattoos, irritation, and bones. 3. Starting the sensor: 1 hour warm up before BG readings available   4. Scan the sensor at least every 8 hours 5. Hold reader within 1.5 inches of sensor to scan 6. When the blood drop and magnifying glass symbol appears, test fingerstick blood glucose prior to making treatment decisions 7. Do a fingerstick blood glucose test if the sensor readings do not match how you feel 8. Remove sensor prior to magnetic resonance imaging (MRI), computed tomography (CT) scan, or high-frequency electrical heat (diathermy) treatment. 9. Freestyle Libre may be worn through a Environmental education officer. It may not be exposed to an advanced Imaging Technology (AIT) body scanner (also called a millimeter wave scanner) or the baggage x-ray machine. Instead, ask for hand-wanding or full-body pat-down and visual inspection.  10. Doses of vitamin C (ascorbic acid) >500 mg every day may cause false high readings. 11. Do not submerge more than 3 feet or keep underwater longer than 30 minutes at a time. Gently pat to dry.  12. Store sensor kit between 39 and 77 degrees Farenheit. Can be refrigerated within this temperature range.  Problems with Freestyle Libre sticking? 1. Order Tegaderm I.V. films to place directly over [facility] sensor on arm. 2. May also order Skin Tac from [facility]. Alcohol swab area you plan to administer  Freestyle Libre then let dry. Once dry, apply Skin Tac in a circular motion (with a spot in the middle for sensor without skin tac) and let dry. Once dry you can apply Freestyle Libre   Problems taking off Freestyle [place]? 1. Remember to try to shower before removing Freestyle Libre 2. Order Tac Away to help remove any extra adhesive left on your skin once you remove Freestyle Libre 3. May also try baby oil to loosen adhesive  [place] Phone number: [phone] Available 7 days a week; excluding holidays 8 AM to 8PM EST  [email or web address]  Please do the following:  Lantus 38 units once daily and Fiasp 20 units with meals as directed today during your appointment. If you have any questions or if you believe something has occurred because of this change, call me or your doctor to let one of [place] know.  Continue checking blood sugars at home. It's really important that you record these and bring these in to your next doctor's appointment.  Continue making the lifestyle changes we've discussed together during our visit. Diet and exercise play a significant role in improving your blood sugars.  Follow-up with me in two weeks.   Hypoglycemia or low blood sugar:   Low blood sugar can happen quickly and may become an emergency if not treated right away.   While this shouldn't happen often, it can be brought upon if you skip a meal or do not eat enough. Also, if your insulin or other diabetes medications are dosed too high,  this can cause your blood sugar to go to low.   Warning signs of low blood sugar include: Feeling shaky or dizzy Feeling weak or tired  Excessive hunger Feeling anxious or upset  Sweating even when you aren't exercising  What to do if I experience low blood sugar? Follow the Rule of 15 Check your blood sugar. If lower than 70, proceed to step 2.  Treat with 15 grams of fast acting carbs which is found in 3-4 glucose tablets. If none are available  you can try hard candy, 1 tablespoon of sugar or honey,4 ounces of fruit juice, or 6 ounces of REGULAR soda.  Re-check your sugar in 15 minutes. If it is still below 70, do what you did in step 2 again. If your blood sugar has come back up, go ahead and eat a snack or small meal made up of complex carbs (ex. Whole grains) and protein at this time to avoid recurrence of low blood sugar.

## 2021-07-07 NOTE — Progress Notes (Addendum)
Subjective:    Patient ID: Cassandra Reid, female    DOB: [birth year], 65 y.o.   MRN: [id number]   Patient is a 65 y.o. female who presents for samples and CGM application. She is in good spirits and presents without assistance. Patient last seen in pharmacy clinic yesterday.  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 Patient reports adherence with medications.  Do you feel that your medications are working for you?  no  Have you been experiencing any side effects to the medications prescribed? no  Do you have any problems obtaining medications due to transportation or finances?  Yes; currently in donut hole and unable to afford insulin and Ozempic   Freestyle Libre 2.0 patient education Person(s)instructed: patient  Patient taking >500 mg Vitamin C: denies Reminded patient to not take Vitamin C with Freestyle Libre.  Instruction: [employer] 14-day Freestyle Libre patient education  CGM overview and set-up 1. Button, touch screen, and icons 2. Power supply and recharging 3. Home screen 4. Date and time 5. Set BG target range: 80-250 mg/dL 6. Set alarm/alert tone  7. Interstitial vs. capillary blood glucose readings  8. When to verify sensor reading with fingerstick blood glucose  Sensor application -- sensor placed on left arm 1. Site selection and site prep with alcohol pad/Skin Tac 2. Sensor prep-sensor pack and sensor applicator 3. Starting the sensor: 1 hour warm up before BG readings available     Will ask for fingersticks the first 12 hours   4. Sensor change every  14 days and rotate site 5. Call [employer] customer service if sensor comes off before 14 days  Safety and Troubleshooting 1. Scan the sensor at least every 8 hours 2. When the "test BG" symbol appears, test fingerstick blood glucose prior to    making treatment decisions 3. Do a fingerstick blood glucose test if the sensor readings do not match how    you feel 4. Remove sensor prior for MRI or CT. Sensor may be damaged by exposure to    airport x-ray screening 5. Vitamin C may cause false high readings and aspirin may cause false low     readings 6. Store sensor kit between 39 and 77 degrees. Can be refrigerated at this temp.  Contact information provided for [employer] customer service and/or trainer.  Objective:   Labs:   Physical Exam Neurological:     Mental Status: She is alert and oriented to person, place, and time.    Lab Results  Component Value Date   HGBA1C 7.9 (A) 05/01/2021   HGBA1C 9.9 (H) 12/09/2020   HGBA1C 9.6 (A) 11/10/2020    Lab Results  Component Value Date   MICRALBCREAT 30-300 11/10/2020    Lipid Panel     Component Value Date/Time   CHOL 120 07/31/2019 1339   TRIG 242 (H) 07/31/2019 1339   HDL 42 (L) 07/31/2019 1339   CHOLHDL 2.9 07/31/2019 1339   VLDL 38.2 07/18/2018 0940   LDLCALC 48 07/31/2019 1339    Clinical Atherosclerotic Cardiovascular Disease (ASCVD): No  The ASCVD Risk score Mikey Bussing DC Jr., et al., 2013) failed to calculate for the following  reasons:   The valid total cholesterol range is 130 to 320 mg/dL    Assessment/Plan:  T2DM is not controlled likely due to financial barriers and patient not having access to medications. Medication adherence appears optimal. Provided samples of Lantus and Fiasp and patient will begin taking tomorrow in place of Novolin 70/30. Will also have patient resume taking [place]. Placed CGM on patient's left arm. Following instruction patient verbalized understanding of treatment plan.    Provided sample  and re-started Lantus 38 units once daily Provided sample and re-started rapid insulin aspart (Fiasp) 20 units with meals (2-3x/day depending on if patient skips full meal at dinnertime).  Continued metformin XR $RemoveBefo'750mg'[id number]$  BID Continued Farxiga $RemoveBeforeDEI'5mg'WHnvLhhTKulPaPOY$  once daily Extensively discussed pathophysiology of diabetes, dietary effects on blood sugar control, and recommended lifestyle interventions. Counseled on s/sx of and management of hypoglycemia Next A1C anticipated September 2022.   Medication Samples have been provided to the patient.  Drug name: Lantus       Strength: 100 units/mL        Qty: 2 pens  LOT: [id number]  Exp.Date: 12/19/21  Dosing instructions: Inject 38 units into skin once daily  The patient has been instructed regarding the correct time, dose, and frequency of taking this medication, including desired effects and most common side effects.   Hughes Better 2:28 PM 07/07/2021   Medication Samples have been provided to the patient.  Drug name: Claiborne Billings       Strength: 100 units/mL        Qty: 4 pens  LOT: [id number]  Exp.Date: 01/17/23  Dosing instructions: Inject 20 units into skin with meals max of 3x/day  The patient has been instructed regarding the correct time, dose, and frequency of taking this medication, including desired effects and most common side effects.   Hughes Better 2:28 PM 07/07/2021,  Follow-up appointment 2 weeks to review sugar readings. Written patient instructions provided.  This appointment required 25 minutes of direct patient care.  Thank you for involving pharmacy to assist in providing this patient's care.  Patient seen with Meyer Russel, PharmD Candidate  The patient is currently using Continuous Glucose Monitoring. The patient is injecting insulin 4 times a day and was previously testing blood glucose 4 times a day. The patient is making adjustments to their diabetes regimen based on glucose readings.

## 2021-07-07 NOTE — Assessment & Plan Note (Signed)
T2DM is not controlled likely due to financial barriers and patient not having access to medications. Medication adherence appears optimal. Provided samples of Lantus and Fiasp and patient will begin taking tomorrow in place of Novolin 70/30. Will also have patient resume taking [place]. Placed CGM on patient's left arm. Following instruction patient verbalized understanding of treatment plan.    1. Provided sample and re-started Lantus 38 units once daily 2. Provided sample and re-started rapid insulin aspart (Fiasp) 20 units with meals (2-3x/day depending on if patient skips full meal at dinnertime).  3. Continued metformin XR 750mg  BID 4. Continued Farxiga 5mg  once daily 5. Extensively discussed pathophysiology of diabetes, dietary effects on blood sugar control, and recommended lifestyle interventions. 6. Counseled on s/sx of and management of hypoglycemia 7. Next A1C anticipated September 2022.   Follow-up appointment 2 weeks to review sugar readings. Written patient instructions provided.

## 2021-07-11 ENCOUNTER — Other Ambulatory Visit: Payer: Self-pay | Admitting: Family Medicine

## 2021-07-11 ENCOUNTER — Ambulatory Visit: Payer: Medicare (Managed Care) | Admitting: Licensed Clinical Social Worker

## 2021-07-11 DIAGNOSIS — Z7189 Other specified counseling: Secondary | ICD-10-CM

## 2021-07-11 DIAGNOSIS — Z789 Other specified health status: Secondary | ICD-10-CM

## 2021-07-11 NOTE — Patient Instructions (Addendum)
Visit Information   Goals Addressed             This Visit's Progress    Coping Skills Enhanced by connecting with mental health provider   On track    Patient Goals/Self-Care Activities: Over the next 30 days Continue to work with [facility] for counseling  Continue taking Lexapro I have placed a new referral to [facility] psychiatry      Find Help in My Community   On track    Timeframe:  Short-Term Goal Priority:  High Start Date:   06/26/2021                          Expected End Date:                         Patient Goals/Self-Care Activities: Over the next 30 days  Congratulations on finding a new apartment  I have placed a referral to my care team to explore resources for moving Call [facility] for community support [phone]      It was a pleasure speaking with you today. Please call the office if needed Patient verbalizes understanding of instructions provided today.  Follow up appointment is scheduled 08/07/21  Sammuel Hines, LCSW Care Management & Coordination  [phone]

## 2021-07-11 NOTE — Chronic Care Management (AMB) (Signed)
Care Management Clinical Social Work Note  07/11/2021 Name: Cassandra Reid MRN: [id number] DOB: 27-Feb-1956  Cassandra Reid is a 65 y.o. year old female who is a primary care patient of Ezequiel Essex, MD.  The Care Management team was consulted for assistance with  coordination needs.  Consent to Services:  The patient was given information about Care Management services, agreed to services, and gave verbal consent prior to initiation of services.  Please see initial visit note for detailed documentation.   Patient agreed to services today and consent obtained.   Assessment: Engaged with patient by phone in response to provider referral for social work care coordination services: [employer]  and [place] [facility].    Patient is making progress with connecting to resources discussed to accomplish goal , but continues to have difficulty with adjusting to her new normal . See Care Plan below for interventions and patient self-care actives.  Recent life changes or stressors: moving to new apartment 09/01/21 does not have support to move.  Recommendation: Patient may benefit from, and is in agreement to connect with [employer].   Follow up Plan: Patient would like continued follow-up from CCM LCSW .  per patient's request will follow up in 30 days.  Will call office if needed prior to next encounter.   : Review of patient past medical history, allergies, medications, and health status, including review of relevant consultants reports was performed today as part of a comprehensive evaluation and provision of chronic care management and care coordination services.  SDOH (Social Determinants of Health) assessments and interventions performed:    Advanced Directives Status: See [employer]yn[employer] application for related entries.  Care Plan  No Known Allergies  Outpatient Encounter Medications as of 07/11/2021  Medication Sig Note   Ascorbic Acid (VITAMIN C PO)  Take 1 tablet by mouth daily.    atorvastatin (LIPITOR) 40 MG tablet Take 1 tablet (40 mg total) by mouth at bedtime.    baclofen (LIORESAL) 10 MG tablet TAKE 1 TABLET BY MOUTH TWICE A DAY AS NEEDED FOR MUSCLE SPASMS (Patient not taking: Reported on 07/06/2021)    BD PEN NEEDLE NANO 2ND GEN 32G X 4 MM MISC USE WITH INSULIN    dapagliflozin propanediol (FARXIGA) 5 MG TABS tablet Take 2 tablets (10 mg total) by mouth daily.    escitalopram (LEXAPRO) 5 MG tablet Take 2 tablets (10 mg total) by mouth daily.    famotidine (PEPCID) 20 MG tablet Take 1 tablet (20 mg total) by mouth 2 (two) times daily for 15 days.    fluticasone (FLONASE) 50 MCG/ACT nasal spray Place 1 spray into both nostrils daily as needed for allergies or rhinitis.    furosemide (LASIX) 40 MG tablet Take 1 tablet (40 mg total) by mouth daily.    gabapentin (NEURONTIN) 600 MG tablet Take 0.5-1 tablets (300-600 mg total) by mouth 3 (three) times daily.    insulin aspart (FIASP FLEXTOUCH) 100 UNIT/ML FlexTouch Pen Inject 20 Units into the skin with breakfast, with lunch, and with evening meal. (Patient not taking: Reported on 07/06/2021)    insulin glargine (LANTUS) 100 unit/mL SOPN Inject 40 Units into the skin daily. (Patient not taking: Reported on 07/06/2021)    Lancet Devices (ONE TOUCH DELICA LANCING DEV) MISC Please use to check blood sugar up to 4 times daily. E11.42    Lancets Misc. (ACCU-CHEK SOFTCLIX LANCET DEV) KIT Use as instructed to check blood sugar 4 times daily. Dx D62.22  lisinopril (ZESTRIL) 5 MG tablet Take 1 tablet (5 mg total) by mouth 2 (two) times daily.    LORazepam (ATIVAN) 0.5 MG tablet TAKE 1 TABLET BY MOUTH EVERY DAY AS NEEDED FOR ANXIETY    magnesium oxide (MAG-OX) 400 (241.3 Mg) MG tablet Take 1 tablet (400 mg total) by mouth 2 (two) times daily.    metFORMIN (GLUCOPHAGE-XR) 750 MG 24 hr tablet Take 1 tablet (750 mg total) by mouth 2 (two) times daily with a meal.    Multiple Vitamin (MULTIVITAMIN PO) Take  1 tablet by mouth daily.    OneTouch Delica Lancets 54O MISC Please use to check blood sugar up to 4 times daily. E11.42    potassium chloride SA (KLOR-CON) 20 MEQ tablet Take 1 tablet (20 mEq total) by mouth 2 (two) times daily. 03/16/2021: Half tablet in the morning, half in the evening.    Semaglutide,0.25 or 0.5MG /DOS, (OZEMPIC, 0.25 OR 0.5 MG/DOSE,) 2 MG/1.5ML SOPN Inject 0.5 mg into the skin once a week. (Patient not taking: No sig reported)    sucralfate (CARAFATE) 1 g tablet Take 1 tablet (1 g total) by mouth 4 (four) times daily -  with meals and at bedtime.    traZODone (DESYREL) 50 MG tablet Take 0.5 tablets (25 mg total) by mouth at bedtime as needed for sleep. (Patient not taking: No sig reported)    No facility-administered encounter medications on file as of 07/11/2021.    Patient Active Problem List   Diagnosis Date Noted   Obesity (BMI 30.0-34.9) 05/25/2021   Anosmia 05/25/2021   Encounter for competency evaluation 05/25/2021   Smoking history 03/29/2021   Osteoporosis 01/18/2021   Foot pain, left 12/09/2020   Encephalomalacia on imaging study 08/25/2020   Fatty liver 05/31/2020   Chronic pain syndrome 04/23/2019   DDD (degenerative disc disease), cervical 04/16/2019   CKD stage 3 due to type 2 diabetes mellitus ([place]) 04/15/2019   Hypertension associated with diabetes ([place]) 07/18/2018   GERD (gastroesophageal reflux disease) 07/18/2018   OSA (obstructive sleep apnea) 07/18/2018   Type 2 diabetes mellitus with diabetic polyneuropathy, with long-term current use of insulin ([place]) 07/18/2018   Fibromyalgia 07/18/2018   Mood disorder ([place]) 07/18/2018   Hyperlipidemia associated with type 2 diabetes mellitus ([place]) 07/18/2018   Legally blind 09/01/2003    Conditions to be addressed/monitored: Depression; Limited social support  Care Plan : General Social Work (Adult)  Updates made by Maurine Cane, LCSW since 07/11/2021 12:00 AM     Problem: Emotional Distress       Goal: Emotional Health Supported/ connect with mental health provider   Start Date: 06/08/2021  This Visit's Progress: On track  Recent Progress: On track  Priority: High  Note:   Current barriers:   Financial constraints related to fixed income and unable pay co-pays, Limited social support, and Mental Health Concerns  Needs Support, Education, and Care Coordination in order to meet unmet mental health needs. Clinical Goal(s): patient will work with SW to address concerns related to stress and connecting with mental health provider patient will work with agencies discussed today to address needs related to managing her mental health  Clinical Interventions:  Assessed patient's progress, coping skills, support system and barriers to care  Solution-Focused Strategies, Mindfulness or Relaxation Training, Active listening / Reflection utilized , Emotional Supportive Provided, Problem [place] , Reviewed mental health medications with patient and discussed compliance: , and Participation in counseling encouraged  ;  Patient waiting on [facility] to connect with her; they have spoken to patient twice  Referral placed to [facility] for medication evaluation.Collaborated with [facility] referral coordinator to place referral (no appointment schedules as of today) Inter-disciplinary care team collaboration (see longitudinal plan of care)    Patient Goals/Self-Care Activities: Over the next 30 days Continue to work with [facility] for counseling  Continue taking Lexapro I have placed a new referral to [facility]     Problem: SDOH/Needs new housing and community resources      Goal: Coping Skills Enhanced by connecting with community Support   Start Date: 06/26/2021  This Visit's Progress: On track  Recent Progress: On track  Priority: High  Note:   Current barriers:    Limited social support, Transportation,  Housing barriers, Mental Health Concerns , and Family and relationship dysfunction Has located new apartment but does not have assistance with moving Clinical Goals: Patient will work with agencies discussed to address needs related to locating new housing and health insurance provider Clinical Interventions:  Inter-disciplinary care team collaboration (see longitudinal plan of care) Assessment of needs, barriers , agencies contacted, as well as how impacting Review various resources, discussed options and provided patient information about -Referral placed to Care Guide to assist with moving; Discussed [facility] center, options and support. provided phone number; uses [facility] transportation to medical appointments;  Housing resources ([place] ) Referral ([employer] and [employer] ) Has spoken with [facility]) for insurance options Received information from [employer]  Will be moving to new apartment Oct 14th  Solution-Focused Strategies, Active listening / Reflection utilized and Emotional Supportive Provided,  Patient Goals/Self-Care Activities: Over the next 30 days Congratulations on finding a new apartment  I have placed a referral to my care team to explore resources for moving [employer] for community support [place], [place] / [place]   [phone] 9:35 AM

## 2021-07-14 ENCOUNTER — Telehealth: Payer: Self-pay | Admitting: *Deleted

## 2021-07-14 NOTE — Progress Notes (Signed)
Virtual Visit via Video Note The purpose of this virtual visit is to provide medical care while limiting exposure to the novel coronavirus.    Consent was obtained for video visit:  Yes.   Answered questions that patient had about telehealth interaction:  Yes.   I discussed the limitations, risks, security and privacy concerns of performing an evaluation and management service by telemedicine. I also discussed with the patient that there may be a patient responsible charge related to this service. The patient expressed understanding and agreed to proceed.  Pt location: Home Physician Location: office Name of referring provider:  Ezequiel Essex, MD I connected with [name] at patients initiation/request on 07/17/2021 at 10:30 AM EDT by video enabled telemedicine application and verified that I am speaking with the correct person using two identifiers. Pt MRN:  [id number] Pt DOB:  29-Aug-1956 Video Participants:  [name]  Assessment and Plan:   Chronic tension-type headache, not intractable History of cerebral aneurysm s/p clipping.  Stable Memory deficits Depression   Start nortriptyline 53m at bedtime.   Neuropsychological testing Follow up 6 months  History of Present Illness:  Cassandra Reid a 65year old female with chronic kidney disease, diabetes, hypertension, fibromyalgia, and history of cerebral aneurysm status post coiling who follows up for cerebral aneurysm.  Neurosurgery note reviewed.   UPDATE: Current medication:  baclofen, Lexapro, gabapentin 6040mTID, naproxen 50071mor headache rescue.  In September, gabapentin was increased.  No improvement.   Due to increased headache frequency, she had an MRI and MRA of head on 08/25/2020, which were personally reviewed and showed few small remote lacunar infarcts in the right caudated and left internal capsule as well as postoperative changes from previous left pterional craniotomy with aneurysm clip at level  of left ICA terminus but no acute intracranial abnormality or new/recurrence aneurysm.    She reports feeling more "teary" and forgetful.  She underwent a competency evaluation in July.  While she did demonstrate memory deficits, it was felt that she can manage her finances.  She also reports pain in the legs, they ache.  She also endorses chest wall and rib pain.  He does have fibromyalgia.  Headaches are still almost daily.     HISTORY: She had clipping and then coiling for non-ruptured aneurysm x2 about 14 years ago.  Her last image was a couple of years ago.  She reports that they saw another aneurysm but nothing that yet required intervention.     Since the surgeries, she feels a pressure on top of head as well as paroxysmal stabbing pain all over the head.  The head pressure lasts 1 to 2 days and occurs once a week.  The paroxsymal pain lasts a minute off and on during the day and occurs daily.  She denies associated nausea, vomiting, photophobia, phonophobia, visual disturbance or unilateral numbness or weakness.  she has been having neck pain radiating   [facility] showed moderate degenerative disc disease at C5-6 with left paracentral disc herniation possibly affecting the left C6 nerve root, as well as moderate to severe degenerative disc disease at C6-7 with bilateral uncovertebral spurs and moderate bilateral neuroforaminal stenosis.  MRA of head from 12/15/2018 personally reviewed and showed artifact from aneurysm clipping obscuring the left circle-of-Willis, as well as a probable 2 x 4 mm supraclinoid right ICA infundibulum (rather than aneurysm).   She also has  fibromyalgia.  She also has significant anxiety.   Past medications:  tizanidine (helpful but not covered by insurance), Cymbalta  Past Medical History: Past Medical History:  Diagnosis Date   Allergic rhinitis 09/26/2018   Anxiety    Arthritis     Asthma    Brain aneurysm 07/18/2018   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 difficile colitis 07/18/2018   Cerebral aneurysm without rupture    Cerebral aneurysm without rupture    Chest pain 03/26/2020   Chronic kidney disease    CKD stage 3 due to type 2 diabetes mellitus ([place]) 04/15/2019   Depression    Diabetes mellitus without complication (HCC)    Fibromyalgia    Former smoker    GERD (gastroesophageal reflux disease)    Heart murmur    History of chicken pox    History of colon polyps    Hyperlipidemia associated with type 2 diabetes mellitus ([place]) 07/18/2018   Hypertension    Hypertension associated with diabetes ([place]) 07/18/2018   IBS (irritable bowel syndrome)    Ischemic brain injury 05/29/2021   Lacunar stroke ([place]) 05/25/2021   Lacunar stroke, History of ([place]) 05/25/2021   Brain MRI 08/25/21: Few small remote lacunar infarcts noted at the right caudate and left internal capsule   Legally blind    Migraines    Myalgia 04/23/2019   Paroxysmal atrial tachycardia ([place])    On telemetry   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   Type 2 diabetes mellitus with hyperglycemia, with long-term current use of insulin ([place]) 12/03/2019    Medications: Outpatient Encounter Medications as of 07/17/2021  Medication Sig Note   Ascorbic Acid (VITAMIN C PO) Take 1 tablet by mouth daily.    atorvastatin (LIPITOR) 40 MG tablet Take 1 tablet (40 mg total) by mouth at bedtime.    baclofen (LIORESAL) 10 MG tablet TAKE 1 TABLET BY MOUTH TWICE A DAY AS NEEDED FOR MUSCLE SPASMS (Patient not taking: Reported on 07/06/2021)    BD PEN NEEDLE NANO 2ND GEN 32G X 4 MM MISC USE WITH INSULIN    dapagliflozin propanediol (FARXIGA) 5 MG TABS tablet Take 2 tablets (10 mg total) by mouth daily.    escitalopram (LEXAPRO) 5 MG tablet Take 2 tablets (10 mg total) by mouth daily.     famotidine (PEPCID) 20 MG tablet Take 1 tablet (20 mg total) by mouth 2 (two) times daily for 15 days.    fluticasone (FLONASE) 50 MCG/ACT nasal spray Place 1 spray into both nostrils daily as needed for allergies or rhinitis.    furosemide (LASIX) 40 MG tablet Take 1 tablet (40 mg total) by mouth daily.    gabapentin (NEURONTIN) 600 MG tablet Take 0.5-1 tablets (300-600 mg total) by mouth 3 (three) times daily.    insulin aspart (FIASP FLEXTOUCH) 100 UNIT/ML FlexTouch Pen Inject 20 Units into the skin with breakfast, with lunch, and with evening meal. (Patient not taking: Reported on 07/06/2021)    insulin glargine (LANTUS) 100 unit/mL SOPN Inject 40 Units into the skin daily. (Patient not taking: Reported on 07/06/2021)    Lancet Devices (ONE TOUCH DELICA LANCING DEV) MISC Please use to check blood sugar up to 4 times daily. E11.42    Lancets Misc. (ACCU-CHEK SOFTCLIX LANCET DEV) KIT Use as instructed to check blood sugar 4 times daily. Dx W38.88  lisinopril (ZESTRIL) 5 MG tablet Take 1 tablet (5 mg total) by mouth 2 (two) times daily.    LORazepam (ATIVAN) 0.5 MG tablet TAKE 1 TABLET BY MOUTH EVERY DAY AS NEEDED FOR ANXIETY    magnesium oxide (MAG-OX) 400 (241.3 Mg) MG tablet Take 1 tablet (400 mg total) by mouth 2 (two) times daily.    metFORMIN (GLUCOPHAGE-XR) 750 MG 24 hr tablet Take 1 tablet (750 mg total) by mouth 2 (two) times daily with a meal.    Multiple Vitamin (MULTIVITAMIN PO) Take 1 tablet by mouth daily.    OneTouch Delica Lancets 16X MISC Please use to check blood sugar up to 4 times daily. E11.42    potassium chloride SA (KLOR-CON) 20 MEQ tablet Take 1 tablet (20 mEq total) by mouth 2 (two) times daily. 03/16/2021: Half tablet in the morning, half in the evening.    Semaglutide,0.25 or 0.5MG/DOS, (OZEMPIC, 0.25 OR 0.5 MG/DOSE,) 2 MG/1.5ML SOPN Inject 0.5 mg into the skin once a week. (Patient not taking: No sig reported)    sucralfate (CARAFATE) 1 g tablet Take 1 tablet (1 g  total) by mouth 4 (four) times daily -  with meals and at bedtime.    traZODone (DESYREL) 50 MG tablet Take 0.5 tablets (25 mg total) by mouth at bedtime as needed for sleep.    No facility-administered encounter medications on file as of 07/17/2021.    Allergies: No Known Allergies  Family History: Family History  Problem Relation Age of Onset   Alcohol abuse Mother    Diabetes Mother    Hypertension Mother    Kidney disease Mother    COPD Father    Diabetes Father    Early death Father    Heart disease Father    Hyperlipidemia Father    Hypertension Father    Diabetes Sister    Hypertension Sister    Breast cancer Sister    Cancer Maternal Grandmother    Alcohol abuse Maternal Grandfather    Cancer Maternal Grandfather    Cancer Paternal Grandmother    Cancer Paternal Grandfather    Diabetes Sister    Hypertension Sister    Kidney disease Sister     Observations/Objective:   There were no vitals taken for this visit. No acute distress.  Alert and oriented.  Speech fluent and not dysarthric.  Language intact.    Follow Up Instructions:    -I discussed the assessment and treatment plan with the patient. The patient was provided an opportunity to ask questions and all were answered. The patient agreed with the plan and demonstrated an understanding of the instructions.   The patient was advised to call back or seek an in-person evaluation if the symptoms worsen or if the condition fails to improve as anticipated.  Metta Clines, DO  CC: Ezequiel Essex, MD

## 2021-07-14 NOTE — Telephone Encounter (Signed)
   Telephone encounter was:  Successful.  07/14/2021 Name: Cassandra Reid MRN: [id number] DOB: 1956-11-07  Cassandra Reid is a 65 y.o. year old female who is a primary care patient of Fayette Pho, MD . The community resource team was consulted for assistance with  Outreach call to pt about movers. Pt advised she is looking for a company that is not expensive as most places were asking for $1000.00. Pt son will be helping her move as well.  Care guide performed the following interventions: Patient provided with information about care guide support team and interviewed to confirm resource needs.  Follow Up Plan:  Care guide will follow up with patient by phone over the next few days.  Alois Cliche -[facility] Guide , Embedded Care Coordination [facility], Care Management  [phone] [address] [place] , [place] [address] Email : [email or web address]

## 2021-07-14 NOTE — Progress Notes (Signed)
Submitted application for FIASP, TRESIBA, & OZEMPIC to [employer] for patient assistance.   Phone: [phone]

## 2021-07-17 ENCOUNTER — Telehealth (INDEPENDENT_AMBULATORY_CARE_PROVIDER_SITE_OTHER): Payer: Medicare (Managed Care) | Admitting: Neurology

## 2021-07-17 ENCOUNTER — Encounter: Payer: Self-pay | Admitting: Neurology

## 2021-07-17 ENCOUNTER — Other Ambulatory Visit: Payer: Self-pay

## 2021-07-17 DIAGNOSIS — R519 Headache, unspecified: Secondary | ICD-10-CM

## 2021-07-17 DIAGNOSIS — F32A Depression, unspecified: Secondary | ICD-10-CM | POA: Diagnosis not present

## 2021-07-17 DIAGNOSIS — G8929 Other chronic pain: Secondary | ICD-10-CM

## 2021-07-17 DIAGNOSIS — R413 Other amnesia: Secondary | ICD-10-CM | POA: Diagnosis not present

## 2021-07-18 ENCOUNTER — Encounter: Payer: Self-pay | Admitting: Psychology

## 2021-07-18 NOTE — Addendum Note (Signed)
Addended by: Leida Lauth on: 07/18/2021 08:08 AM   Modules accepted: Orders

## 2021-07-21 ENCOUNTER — Telehealth (INDEPENDENT_AMBULATORY_CARE_PROVIDER_SITE_OTHER): Payer: Medicare (Managed Care) | Admitting: Pharmacist

## 2021-07-21 ENCOUNTER — Other Ambulatory Visit: Payer: Self-pay

## 2021-07-21 DIAGNOSIS — E1142 Type 2 diabetes mellitus with diabetic polyneuropathy: Secondary | ICD-10-CM

## 2021-07-21 DIAGNOSIS — Z794 Long term (current) use of insulin: Secondary | ICD-10-CM | POA: Diagnosis not present

## 2021-07-21 NOTE — Progress Notes (Signed)
Subjective:    Patient ID: Cassandra Reid, female    DOB: 06/25/1956, 65 y.o.   MRN: [id number]   Patient is a 65 y.o. female who presents for diabetes management. She is in good spirits and presents without assistance. Patient referred on 05/18/21 and last seen by Primary Care Provider on 06/05/21. Patient last seen in pharmacy clinic 07/06/21.  Insurance coverage/medication affordability: [employer]  Current diabetes medications include: semaglutide (Ozempic) 0.5mg  once weekly (ran out; awaiting patient assistance), insulin glargine (Lantus) 38 units once daily, insulin aspart (Fiasp) 20 units with meals, metformin XR 750 BID  Patient states that She is taking her medications as prescribed. Patient reports adherence with medications.  Do you feel that your medications are working for you?  no  Have you been experiencing any side effects to the medications prescribed? no  Do you have any problems obtaining medications due to transportation or finances?  Yes; currently in donut hole and unable to afford insulin and Ozempic   Freestyle Libre 2.0 patient education Person(s)instructed: patient  Patient taking >500 mg Vitamin C: denies Reminded patient to not take Vitamin C with Freestyle Libre.  Instruction: [employer] 14-day Freestyle Libre patient education  CGM overview and set-up 1. Button, touch screen, and icons 2. Power supply and recharging 3. Home screen 4. Date and time 5. Set BG target range: 80-250 mg/dL 6. Set alarm/alert tone  7. Interstitial vs. capillary blood glucose readings  8. When to verify sensor reading with fingerstick blood glucose  Sensor application -- sensor placed on left arm 1. Site selection and site prep with alcohol pad/Skin Tac 2. Sensor prep-sensor pack and sensor applicator 3. Starting the sensor: 1 hour warm up before BG readings available     Will ask for fingersticks the first 12 hours   4. Sensor change every 14 days and rotate site 5.  Call [employer] customer service if sensor comes off before 14 days  Safety and Troubleshooting 1. Scan the sensor at least every 8 hours 2. When the "test BG" symbol appears, test fingerstick blood glucose prior to    making treatment decisions 3. Do a fingerstick blood glucose test if the sensor readings do not match how    you feel 4. Remove sensor prior for MRI or CT. Sensor may be damaged by exposure to    airport x-ray screening 5. Vitamin C may cause false high readings and aspirin may cause false low     readings 6. Store sensor kit between 39 and 77 degrees. Can be refrigerated at this temp.  Contact information provided for [employer] customer service and/or trainer.  Objective:     Labs:   Physical Exam Neurological:     Mental Status: She is alert and oriented to person, place, and time.    Lab Results  Component Value Date   HGBA1C 7.9 (A) 05/01/2021   HGBA1C 9.9 (H) 12/09/2020   HGBA1C 9.6 (A) 11/10/2020    Lab Results  Component Value Date   MICRALBCREAT 30-300 11/10/2020    Lipid Panel     Component Value Date/Time   CHOL 120 07/31/2019 1339   TRIG 242 (H) 07/31/2019 1339   HDL 42 (L) 07/31/2019 1339   CHOLHDL 2.9 07/31/2019 1339   VLDL 38.2 07/18/2018 0940   LDLCALC 48 07/31/2019 1339    Clinical Atherosclerotic Cardiovascular Disease (ASCVD): No  The ASCVD Risk score Mikey Bussing DC Jr., et al., 2013) failed to calculate for the following reasons:   The valid  total cholesterol range is 130 to 320 mg/dL    Assessment/Plan:  T2DM is not controlled likely due to financial barriers and patient not having access to medications. Medication adherence appears optimal. Will decrease patient's dose of Fiasp with dinner due to patient experiencing significant drop in blood glucose post-prandial. Patient cannot afford to continue using Libre at this time but is willing to pay for device pending patient assistance approval of medications. Will also consider patient  assistance for SGLT2. Following instruction patient verbalized understanding of treatment plan.    Continued Lantus 38 units once daily Decreased rapid insulin aspart (Fiasp) to 20 units with breakfast and 15 units with dinner (2-3x/day depending on if patient skips full meal at dinnertime).  Continued metformin XR $RemoveBefo'750mg'mROgHcHqATa$  BID Continued Farxiga $RemoveBeforeDEI'5mg'jsjiIZwrKNGQozaI$  once daily Extensively discussed pathophysiology of diabetes, dietary effects on blood sugar control, and recommended lifestyle interventions. Counseled on s/sx of and management of hypoglycemia Next A1C anticipated September 2022.   Medication Left samples for patient in sample room fridge  Drug name: Tyler Aas      Strength: 100 units/mL        Qty: 6 pens  LOT: [id number] Exp.Date: 02/16/22  Dosing instructions: Inject 38 units into skin once daily  The patient has been instructed regarding the correct time, dose, and frequency of taking this medication, including desired effects and most common side effects.   Hughes Better 1:46 PM 07/21/2021    Follow-up appointment 3-4 weeks to review sugar readings. Written patient instructions provided.  This appointment required 20 minutes of direct patient care.  Thank you for involving pharmacy to assist in providing this patient's care.  The patient is currently using Continuous Glucose Monitoring. The patient is injecting insulin 4 times a day and was previously testing blood glucose 4 times a day. The patient is making adjustments to their diabetes regimen based on glucose readings.

## 2021-07-25 ENCOUNTER — Encounter: Payer: Medicare (Managed Care) | Admitting: Psychology

## 2021-07-26 ENCOUNTER — Other Ambulatory Visit: Payer: Self-pay | Admitting: Family Medicine

## 2021-07-26 DIAGNOSIS — E876 Hypokalemia: Secondary | ICD-10-CM

## 2021-07-26 NOTE — Assessment & Plan Note (Signed)
T2DM is not controlled likely due to financial barriers and patient not having access to medications. Medication adherence appears optimal. Will decrease patient's dose of Fiasp with dinner due to patient experiencing significant drop in blood glucose post-prandial. Patient cannot afford to continue using Libre at this time but is willing to pay for device pending patient assistance approval of medications. Will also consider patient assistance for SGLT2. Following instruction patient verbalized understanding of treatment plan.    1. Continued Lantus 38 units once daily 2. Decreased rapid insulin aspart (Fiasp) to 20 units with breakfast and 15 units with dinner (2-3x/day depending on if patient skips full meal at dinnertime).  3. Continued metformin XR 750mg  BID 4. Continued Farxiga 5mg  once daily 5. Extensively discussed pathophysiology of diabetes, dietary effects on blood sugar control, and recommended lifestyle interventions. 6. Counseled on s/sx of and management of hypoglycemia 7. Next A1C anticipated September 2022.

## 2021-07-27 ENCOUNTER — Telehealth: Payer: Self-pay | Admitting: *Deleted

## 2021-07-27 NOTE — Telephone Encounter (Signed)
   Telephone encounter was:  Unsuccessful.  07/27/2021 Name: AVNOOR KOURY MRN: [id number] DOB: April 28, 1956  Unsuccessful outbound call made today to assist with:  Going to email lists of movers as she requested   Outreach Attempt:  2nd Attempt  A HIPAA compliant voice message was left requesting a return call.  Instructed patient to call back at .  Instructed patient to call back at [phone]  at their earliest convenience.   Alois Cliche -[facility] Guide , Embedded Care Coordination [facility], Care Management  [phone] [address] , [place] [address] Email : [email or web address]

## 2021-07-28 ENCOUNTER — Ambulatory Visit: Payer: Medicare (Managed Care) | Admitting: Family Medicine

## 2021-07-28 ENCOUNTER — Encounter: Payer: Self-pay | Admitting: Family Medicine

## 2021-07-28 ENCOUNTER — Other Ambulatory Visit: Payer: Self-pay

## 2021-07-28 VITALS — BP 122/62 | HR 74 | Wt 174.0 lb

## 2021-07-28 DIAGNOSIS — Z0000 Encounter for general adult medical examination without abnormal findings: Secondary | ICD-10-CM

## 2021-07-28 DIAGNOSIS — Z794 Long term (current) use of insulin: Secondary | ICD-10-CM

## 2021-07-28 DIAGNOSIS — Z9189 Other specified personal risk factors, not elsewhere classified: Secondary | ICD-10-CM

## 2021-07-28 DIAGNOSIS — Z23 Encounter for immunization: Secondary | ICD-10-CM | POA: Diagnosis not present

## 2021-07-28 DIAGNOSIS — E1159 Type 2 diabetes mellitus with other circulatory complications: Secondary | ICD-10-CM | POA: Diagnosis not present

## 2021-07-28 DIAGNOSIS — E1142 Type 2 diabetes mellitus with diabetic polyneuropathy: Secondary | ICD-10-CM

## 2021-07-28 DIAGNOSIS — N183 Chronic kidney disease, stage 3 unspecified: Secondary | ICD-10-CM

## 2021-07-28 DIAGNOSIS — E1122 Type 2 diabetes mellitus with diabetic chronic kidney disease: Secondary | ICD-10-CM | POA: Diagnosis not present

## 2021-07-28 DIAGNOSIS — I152 Hypertension secondary to endocrine disorders: Secondary | ICD-10-CM

## 2021-07-28 DIAGNOSIS — F39 Unspecified mood [affective] disorder: Secondary | ICD-10-CM

## 2021-07-28 LAB — POCT GLYCOSYLATED HEMOGLOBIN (HGB A1C): HbA1c, POC (controlled diabetic range): 8.9 % — AB (ref 0.0–7.0)

## 2021-07-28 MED ORDER — ZOSTER VAC RECOMB ADJUVANTED 50 MCG/0.5ML IM SUSR
0.5000 mL | Freq: Once | INTRAMUSCULAR | 0 refills | Status: AC
Start: 2021-07-28 — End: 2021-07-28

## 2021-07-28 MED ORDER — ZOSTER VAC RECOMB ADJUVANTED 50 MCG/0.5ML IM SUSR: 0.5000 mL | Freq: Once | INTRAMUSCULAR | 0 refills | Status: AC

## 2021-07-28 NOTE — Patient Instructions (Addendum)
It was wonderful to see you today. Thank you for allowing me to be a part of your care. Below is a short summary of what we discussed at your visit today:  Diabetes - you need to call the manufacturer and ask for a "low income subsidy form" - call the number [phone] - they will mail you a form - bring the form in for [place] to fill out and send back  Neuropsych testing - please keep your appointment in December - if you have any questions or concerns let us know  Kidney function - today we are getting labs to make sure your kidneys are tolerating your diabetes medicine well - If the results are normal, I will send you a letter or MyChart message. If the results are abnormal, I will give you a call.    Anxiety - there is a Restaurant manager, fast food number "[phone]", call anytime 24/7  Cholesterol testing - please come back at your convenience for blood work to check your cholesterol - simply call the front desk and let them know when you'd like to come in for the lab    Please bring all of your medications to every appointment!  If you have any questions or concerns, please do not hesitate to contact us via phone or MyChart message.   Fayette Pho, MD

## 2021-07-28 NOTE — Progress Notes (Addendum)
SUBJECTIVE:   CHIEF COMPLAINT / HPI:   Diabetes - Cassandra Reid has been working with our clinical pharmacist Dr. [name] on her T2DM med regimen and getting financial assistance - A1c today 8.9, up from 7.9 05/01/21 (likely due to medication inaccessibility 2/2 cost) - current regimen includes metformin XR 750 mg BID, Farxiga 5 mg daily, Lantus 38 units daily, and Fiasp 20 units with breakfast and 15 units with dinner - she really likes the dexcom system to avoid finger sticks and get notifications on her phone - Dr. Nicholaus Bloom helping her to obtain manufacturer's financial assistance program, patient needs to go on website and will out a form  HLD - current regimen includes lipitor 40 mg qHS - last lipid panel on file from 07/31/2019; demonstrated total cholesterol 120, trig 242, LDL 48, HDL 42 - repeat lipid panel ordered 03/14/2021 not completed  PERTINENT  PMH / PSH: HTN, T2DM, HLD, GERD, hepatic steatosis, OSA, osteoporosis, degenerative disc disease, legally blind, fibromyalgia  OBJECTIVE:   BP 122/62   Pulse 74   Wt 174 lb (78.9 kg)   BMI 32.88 kg/m    PHQ-9:  Depression screen [facility] 2/9 07/28/2021 06/05/2021 05/25/2021  Decreased Interest 2 2 3   Down, Depressed, Hopeless 3 2 2   PHQ - 2 Score 5 4 5   Altered sleeping 2 3 3   Tired, decreased energy 1 2 2   Change in appetite 0 1 1  Feeling bad or failure about yourself  2 2 1   Trouble concentrating 0 1 1  Moving slowly or fidgety/restless 0 0 0  Suicidal thoughts 0 0 0  PHQ-9 Score 10 13 13   Difficult doing work/chores Very difficult - -  Some recent data might be hidden     GAD-7:  GAD 7 : Generalized Anxiety Score 06/05/2021 09/08/2020 08/11/2020 05/26/2020  Nervous, Anxious, on Edge 3 3 3 3   Control/stop worrying 3 3 3 3   Worry too much - different things 3 3 3 3   Trouble relaxing 3 3 3 3   Restless 0 3 - 1  Easily annoyed or irritable 2 0 0 1  Afraid - awful might happen 3 3 3 1   Total GAD 7 Score 17 18 - 15   Anxiety Difficulty - Very difficult - Somewhat difficult     Physical Exam General: Awake, alert, oriented Cardiovascular: Regular rate and rhythm, S1 and S2 present, no murmurs auscultated Respiratory: Lung fields clear to auscultation bilaterally  ASSESSMENT/PLAN:   Mood disorder (HCC) Feels as though Lexapro is not working well for her. Essentially unchanged PHQ-9 and GAD7. She is tolerating this well, though. No adverse side effects noted. Previously referred to psychiatry, however due to financial limitations cannot afford copay for psychiatrist or therapist. Has been supported by our CCM team, has been enrolled in a no-cost therapy study at one of the local colleges. She is hopeful this will provide some relief. Also was referred for neuropsych testing, has an appointment in December.   CKD stage 3 due to type 2 diabetes mellitus [facility]) Our clinical pharmacist has been working with Ms. on her diabetes regimen. Due for a renal function recheck on the Farxiga. Will obtain BMP today.   Type 2 diabetes mellitus with diabetic polyneuropathy, with long-term current use of insulin (HCC) A1c today 8.9, up from 7.9 three months ago. Likely due to medication inaccessibility 2/2 cost. Current regimen includes metformin XR 750 mg BID, Farxiga 5 mg daily, Lantus 38 units daily, and Fiasp 20  units with breakfast and 15 units with dinner. Cassandra Reid really likes the dexcom sensor system and hopes to be able to continue to use it. Currently working with Dr. [name] on manufacturer assistance programs for cost of meds.   Hypertension associated with diabetes (HCC) BP well within goal today at 122/62. No episodes of pre-syncope or orthostatics. Tolerating well. Current regimen lisinopril 5 mg BID, lasix 40 mg daily. Will obtain BMP today. No changes to medications at this time.   Healthcare maintenance Received influenza vaccine today. Tolerated well. Sent shingles vaccine script to pharmacy.       Fayette Pho, MD [facility]

## 2021-07-29 LAB — BASIC METABOLIC PANEL
BUN/Creatinine Ratio: 16 (ref 12–28)
BUN: 16 mg/dL (ref 8–27)
CO2: 19 mmol/L — ABNORMAL LOW (ref 20–29)
Calcium: 9.4 mg/dL (ref 8.7–10.3)
Chloride: 103 mmol/L (ref 96–106)
Creatinine, Ser: 1.03 mg/dL — ABNORMAL HIGH (ref 0.57–1.00)
Glucose: 322 mg/dL — ABNORMAL HIGH (ref 65–99)
Potassium: 4.6 mmol/L (ref 3.5–5.2)
Sodium: 140 mmol/L (ref 134–144)
eGFR: 61 mL/min/{1.73_m2} (ref >59)

## 2021-07-30 DIAGNOSIS — Z0000 Encounter for general adult medical examination without abnormal findings: Secondary | ICD-10-CM | POA: Insufficient documentation

## 2021-07-30 NOTE — Assessment & Plan Note (Signed)
BP well within goal today at 122/62. No episodes of pre-syncope or orthostatics. Tolerating well. Current regimen lisinopril 5 mg BID, lasix 40 mg daily. Will obtain BMP today. No changes to medications at this time.

## 2021-07-30 NOTE — Assessment & Plan Note (Addendum)
Feels as though Lexapro is not working well for her. Essentially unchanged PHQ-9 and GAD7. She is tolerating this well, though. No adverse side effects noted. Previously referred to psychiatry, however due to financial limitations cannot afford copay for psychiatrist or therapist. Has been supported by our CCM team, has been enrolled in a no-cost therapy study at one of the local colleges. She is hopeful this will provide some relief. Also was referred for neuropsych testing, has an appointment in December.

## 2021-07-30 NOTE — Assessment & Plan Note (Signed)
Received influenza vaccine today. Tolerated well. Sent shingles vaccine script to pharmacy.

## 2021-07-30 NOTE — Assessment & Plan Note (Signed)
Our clinical pharmacist has been working with Ms. Gracelyn Nurse on her diabetes regimen. Due for a renal function recheck on the Farxiga. Will obtain BMP today.

## 2021-07-30 NOTE — Assessment & Plan Note (Signed)
A1c today 8.9, up from 7.9 three months ago. Likely due to medication inaccessibility 2/2 cost. Current regimen includes metformin XR 750 mg BID, Farxiga 5 mg daily, Lantus 38 units daily, and Fiasp 20 units with breakfast and 15 units with dinner. [name] really likes the dexcom sensor system and hopes to be able to continue to use it. Currently working with Dr. [name] on manufacturer assistance programs for cost of meds.

## 2021-07-31 ENCOUNTER — Other Ambulatory Visit: Payer: Self-pay | Admitting: Family Medicine

## 2021-07-31 DIAGNOSIS — M797 Fibromyalgia: Secondary | ICD-10-CM

## 2021-07-31 DIAGNOSIS — G894 Chronic pain syndrome: Secondary | ICD-10-CM

## 2021-07-31 DIAGNOSIS — M62838 Other muscle spasm: Secondary | ICD-10-CM

## 2021-08-02 NOTE — Progress Notes (Signed)
Received notification from [employer] regarding approval for FIASP, TRESIBA & OZEMPIC (2MG  PEN/ 0.25MG  ONCE WEEKLY). Patient assistance approved from 08/01/21 to 11/18/21.  MEDS [facility] SHIP TO [facility]  Phone: [phone]

## 2021-08-04 ENCOUNTER — Encounter: Payer: Self-pay | Admitting: Family Medicine

## 2021-08-07 ENCOUNTER — Ambulatory Visit: Payer: Medicare (Managed Care) | Admitting: Licensed Clinical Social Worker

## 2021-08-07 ENCOUNTER — Encounter: Payer: Self-pay | Admitting: Family Medicine

## 2021-08-07 DIAGNOSIS — Z7189 Other specified counseling: Secondary | ICD-10-CM

## 2021-08-07 NOTE — Chronic Care Management (AMB) (Signed)
Care Management Clinical Social Work Note  08/07/2021 Name: Cassandra Reid MRN: [id number] DOB: 06/27/1956  Cassandra Reid is a 65 y.o. year old female who is a primary care patient of Cassandra Essex, MD.  The Care Management team was consulted for assistance with chronic disease management and coordination needs.  Assessment: IT sales professional and Resources.   [name] , but continues to have difficulty with obtaining ongoing emotional support for supportive therapies . See Care Plan below for interventions and patient self-care actives.  Recent life changes or stressors: connected with Women's Resource group but the group was not a good fit for her.  Recommendation: Patient may benefit from, and is in agreement to connect with a new support Cassandra Reid until [employer] is able to start individual therapy..   Follow up Plan: Patient would like continued follow-up from CCM LCSW .  per patient's request will follow up in 30 days.  Will call office if needed prior to next encounter.     Review of patient past medical history, allergies, medications, and health status, including review of relevant consultants reports was performed today as part of a comprehensive evaluation and provision of chronic care management and care coordination services.  SDOH (Social Determinants of Health) assessments and interventions performed:    Advanced Directives Status: See Vynca application for related entries.  Care Plan  No Known Allergies  Outpatient Encounter Medications as of 08/07/2021  Medication Sig Note   Ascorbic Acid (VITAMIN C PO) Take 1 tablet by mouth daily. (Patient not taking: Reported on 07/28/2021)    atorvastatin (LIPITOR) 40 MG tablet Take 1 tablet (40 mg total) by mouth at bedtime.    baclofen (LIORESAL) 10 MG tablet TAKE 1 TABLET BY MOUTH TWICE A DAY AS  NEEDED FOR MUSCLE SPASM    BD PEN NEEDLE NANO 2ND GEN 32G X 4 MM MISC USE WITH INSULIN    dapagliflozin propanediol (FARXIGA) 5 MG TABS tablet Take 2 tablets (10 mg total) by mouth daily.    escitalopram (LEXAPRO) 5 MG tablet Take 2 tablets (10 mg total) by mouth daily.    famotidine (PEPCID) 20 MG tablet Take 1 tablet (20 mg total) by mouth 2 (two) times daily for 15 days.    fluticasone (FLONASE) 50 MCG/ACT nasal spray Place 1 spray into both nostrils daily as needed for allergies or rhinitis.    furosemide (LASIX) 40 MG tablet Take 1 tablet (40 mg total) by mouth daily. 07/28/2021: Patient taking prn depending on weight and edema   gabapentin (NEURONTIN) 600 MG tablet Take 0.5-1 tablets (300-600 mg total) by mouth 3 (three) times daily.    insulin aspart (FIASP FLEXTOUCH) 100 UNIT/ML FlexTouch Pen Inject 20 Units into the skin with breakfast, with lunch, and with evening meal. 07/28/2021: 20 units at breakfast and 15 units at lunch   insulin glargine (LANTUS) 100 unit/mL SOPN Inject 40 Units into the skin daily.    KLOR-CON M20 20 MEQ tablet TAKE 1 TABLET BY MOUTH TWICE A DAY    Lancet Devices (ONE TOUCH DELICA LANCING DEV) MISC Please use to check blood sugar up to 4 times daily. E11.42    Lancets Misc. (ACCU-CHEK SOFTCLIX LANCET DEV) KIT Use as instructed to check blood sugar 4 times daily. Dx E11.42    lisinopril (ZESTRIL) 5 MG tablet Take 1 tablet (5 mg total) by mouth 2 (two) times  daily.    LORazepam (ATIVAN) 0.5 MG tablet TAKE 1 TABLET BY MOUTH EVERY DAY AS NEEDED FOR ANXIETY    magnesium oxide (MAG-OX) 400 (241.3 Mg) MG tablet Take 1 tablet (400 mg total) by mouth 2 (two) times daily.    metFORMIN (GLUCOPHAGE-XR) 750 MG 24 hr tablet Take 1 tablet (750 mg total) by mouth 2 (two) times daily with a meal.    Multiple Vitamin (MULTIVITAMIN PO) Take 1 tablet by mouth daily.    OneTouch Delica Lancets 07M MISC Please use to check blood sugar up to 4 times daily. E11.42    Semaglutide,0.25 or  0.$Rem'5MG'sQeG$ /DOS, (OZEMPIC, 0.25 OR 0.5 MG/DOSE,) 2 MG/1.5ML SOPN Inject 0.5 mg into the skin once a week. (Patient not taking: No sig reported)    sucralfate (CARAFATE) 1 g tablet Take 1 tablet (1 g total) by mouth 4 (four) times daily -  with meals and at bedtime.    traZODone (DESYREL) 50 MG tablet Take 0.5 tablets (25 mg total) by mouth at bedtime as needed for sleep.    [START ON 09/26/2021] Zoster Vaccine Adjuvanted [facility]) injection Inject 0.5 mLs into the muscle once for 1 dose.    No facility-administered encounter medications on file as of 08/07/2021.    Patient Active Problem List   Diagnosis Date Noted   Healthcare maintenance 07/30/2021   Obesity (BMI 30.0-34.9) 05/25/2021   Anosmia 05/25/2021   Encounter for competency evaluation 05/25/2021   Smoking history 03/29/2021   Osteoporosis 01/18/2021   Foot pain, left 12/09/2020   Encephalomalacia on imaging study 08/25/2020   Fatty liver 05/31/2020   Chronic pain syndrome 04/23/2019   DDD (degenerative disc disease), cervical 04/16/2019   CKD stage 3 due to type 2 diabetes mellitus ([place]) 04/15/2019   Hypertension associated with diabetes ([place]) 07/18/2018   GERD (gastroesophageal reflux disease) 07/18/2018   OSA (obstructive sleep apnea) 07/18/2018   Type 2 diabetes mellitus with diabetic polyneuropathy, with long-term current use of insulin ([place]) 07/18/2018   Fibromyalgia 07/18/2018   Mood disorder ([place]) 07/18/2018   Hyperlipidemia associated with type 2 diabetes mellitus ([place]) 07/18/2018   Legally blind 09/01/2003    Conditions to be addressed/monitored: Depression; Financial constraints  Care Plan : General Social Work (Adult)  Updates made by Maurine Cane, LCSW since 08/07/2021 12:00 AM     Problem: Emotional Distress      Goal: Emotional Health Supported/ connect with mental health provider   Start Date: 06/08/2021  This Visit's Progress: On track  Recent Progress: On track  Priority: High  Note:   Current  barriers:   Financial constraints related to fixed income and unable pay co-pays, Limited social support, and Mental Health Concerns  Still waiting to connect with [facility]  Needs Support, Education, and Care Coordination in order to meet unmet mental health needs. Clinical Goal(s): patient will work with SW to address concerns related to stress and connecting with mental health provider patient will work with agencies discussed today to address needs related to managing her mental health  Clinical Interventions:  Assessed patient's progress, coping skills, support system and barriers to care  Completed assessment with [facility] Patient waiting on [facility] to connect with her; they have spoken to patient twice Solution-Focused Strategies Active listening / Reflection utilized  Emotional Supportive Provided Problem [place]  Provided brief CBT  Quality of sleep assessed & Sleep Hygiene techniques promoted  Participation in support group encouraged  Provided EMMI education information  on Relieving Stress and Getting a good night sleep [facility] [phone] with Patient on the line to connect her with support group   ;   Inter-disciplinary care team collaboration (see longitudinal plan of care)    Patient Goals/Self-Care Activities: Over the next 30 days Continue to work with [place] for counseling  Continue taking Lexapro Review your EMMI Education on Relieving Stress and Getting a Good night sleep Call [place] the register for zoom support group     Problem: SDOH/Needs new housing and community resources      Goal: Coping Skills Enhanced by connecting with community Support   Start Date: 06/26/2021  This Visit's Progress: On track  Recent Progress: On track  Priority: High  Note:   Current barriers:    Limited social support, Transportation, Housing barriers,  Mental Health Concerns , and Family and relationship dysfunction Has located new apartment but does not have assistance with moving Clinical Goals: Patient will work with agencies discussed to address needs related to locating new housing and health insurance provider Clinical Interventions:  Inter-disciplinary care team collaboration (see longitudinal plan of care) Assessment of needs, barriers , agencies contacted, as well as how impacting Review various resources, discussed options and provided patient information ; Discussed Women's resources center, options and support. provided phone number; uses [facility] transportation to medical appointments;  Referral ([facility] Has spoken with [place] [facility]) for insurance options Will be moving to new apartment Oct 14th  Solution-Focused Strategies, Active listening / Reflection utilized and Emotional Supportive Provided,  Patient Goals/Self-Care Activities: Over the next 30 days Congratulations on finding a new apartment  Keep appointment with [employer] for legal community support [place], [place] / [place]   [phone] 9:42 AM

## 2021-08-07 NOTE — Patient Instructions (Signed)
Visit Information   Goals Addressed             This Visit's Progress    Coping Skills Enhanced by connecting with mental health provider   On track    Patient Goals/Self-Care Activities: Over the next 30 days Continue to work with [facility] for counseling  Continue taking Lexapro Review your EMMI Education on Relieving Stress and Getting a Good night sleep Call [facility] [phone] the register for zoom support group      Find Help in My Community       Timeframe:  Short-Term Goal Priority:  High Start Date:   06/26/2021                          Expected End Date:                        Patient Goals/Self-Care Activities: Over the next 30 days  Congratulations on finding a new apartment  Keep appointment with [employer] for legal community support [phone]         Patient verbalizes understanding of instructions provided today and agrees to view in [place].   Telephone follow up appointment with care management team member scheduled for: 09/06/2021  Sammuel Hines, LCSW Care Management & Coordination  [facility] / [employer]   [phone]

## 2021-08-08 MED ORDER — SUCRALFATE 1 G PO TABS: 1.0000 g | ORAL_TABLET | Freq: Three times a day (TID) | ORAL | 0 refills | Status: DC

## 2021-08-08 NOTE — Telephone Encounter (Signed)
Medication Samples left in sample room fridge for patient.  Drug name: Ozempic       Strength: 0.25mg         Qty: 1 pen  LOT: [id number]  Exp.Date: 12/20/23  Dosing instructions: Inject 0.25mg  into skin once weekly for 4 weeks and then increase to 0.5mg  once weekly  The patient has been instructed regarding the correct time, dose, and frequency of taking this medication, including desired effects and most common side effects.   Cassandra Reid 4:52 PM 08/08/2021

## 2021-08-09 ENCOUNTER — Encounter: Payer: Self-pay | Admitting: Family Medicine

## 2021-08-09 ENCOUNTER — Other Ambulatory Visit: Payer: Self-pay

## 2021-08-09 ENCOUNTER — Ambulatory Visit (INDEPENDENT_AMBULATORY_CARE_PROVIDER_SITE_OTHER): Payer: Medicare (Managed Care) | Admitting: Family Medicine

## 2021-08-09 VITALS — BP 161/76 | HR 87 | Ht 61.0 in | Wt 172.0 lb

## 2021-08-09 DIAGNOSIS — H6982 Other specified disorders of Eustachian tube, left ear: Secondary | ICD-10-CM | POA: Diagnosis not present

## 2021-08-09 DIAGNOSIS — Z20822 Contact with and (suspected) exposure to covid-19: Secondary | ICD-10-CM

## 2021-08-09 MED ORDER — LORATADINE 10 MG PO TABS: 10.0000 mg | ORAL_TABLET | Freq: Every day | ORAL | 11 refills | Status: DC

## 2021-08-09 MED ORDER — FLUTICASONE PROPIONATE 50 MCG/ACT NA SUSP: 1.0000 | Freq: Every day | NASAL | 1 refills | Status: DC | PRN

## 2021-08-09 NOTE — Patient Instructions (Addendum)
Thank you for coming into the office today.   As discussed, perform the maneuver shown today in the exam room (Epley). Use Flonase daily.   Go to the Emergency Department if your dizziness worsens or you being falling.   Continue taking your medications daily!   Take Care,   Dr. Rachael Darby

## 2021-08-09 NOTE — Progress Notes (Signed)
j

## 2021-08-13 ENCOUNTER — Encounter: Payer: Self-pay | Admitting: Family Medicine

## 2021-08-13 NOTE — Progress Notes (Signed)
   SUBJECTIVE:   CHIEF COMPLAINT / HPI:    Cassandra Reid is a 65 y.o. female here for to discuss feeling off-balance.    Symptoms have been occurring all week but is worse with head movement.  Had dizziness for about an hour this morning.  She " feels off".  The symptoms are not new for her but they typically only last about 20 minutes but today they lasted for an hour.  Feels like he has been progressively worse in the past week.  She is worried that they may be related to her head surgeries.  Denies chest discomfort, shortness of breath.  She did take baclofen for the first time earlier this week.  She took 1 earlier this morning as well.  Dizziness is not described as room spinning.  Reports her sugars have been in the 200s currently.  She is working with Dr. Tresa Endo to bring them down.  States her ears feel clogged.  Endorses eye pain, sinus pressure, headache and jaw pain this morning.  Ear pain is worse than the left.  Reports muffled hearing.  She used a warm cloth to try to clear when earwax drops did not help.  Patient states she always has a stuffy nose.  Has history of maxillary sinus cyst from a long time ago.  Denies fever, changes in taste and smell, myalgias.  Recent sick contacts.  She is COVID vaccinated and boosted x2.   PERTINENT  PMH / PSH: reviewed and updated as appropriate   OBJECTIVE:   BP (!) 161/76   Pulse 87   Ht 5\' 1"  (1.549 m)   Wt 172 lb (78 kg)   SpO2 98%   BMI 32.50 kg/m    GEN:     alert, well-appearing female and no distress    HENT:   Surgical scar on left forehead, mucus membranes moist, oropharyngeal without lesions or exudate, tonsils normal, no erythema , no turbinate hypertrophy, nares patent, no nasal discharge, bilateral effusions, no TM erythema EYES:   pupils equal and reactive, no scleral injection NECK:  normal ROM, no lymphadenopathy  RESP:  clear to auscultation bilaterally, no increased work of breathing  CVS:   regular rate and  rhythm NEURO: cranial nerves II to XII grossly intact, positive Romberg, Normal heel-to-shin, Normal finger-to-nose Skin:   warm and dry     ASSESSMENT/PLAN:   Eustachian tube dysfunction  Overall patient is well-appearing well-hydrated.  Considered COVID testing but low suspicion for COVID infection at this time. Suspect patient's symptoms are related to eustachian tube dysfunction given bilateral ear effusions.  Reviewed recent MRA/MR Head.  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  Low suspicion for central cause of dizziness.  Showed patient Epley maneuver in office and handout given as I am unable to rule out BPPV as a cause at this time.  Trial Flonase and Claritin for eustachian tube dysfunction.  Follow-up in office in 1 month. ED precautions given.  Patient agrees with plan   , DO PGY-3, [facility] 08/13/2021

## 2021-08-15 ENCOUNTER — Other Ambulatory Visit: Payer: Self-pay

## 2021-08-15 ENCOUNTER — Encounter: Payer: Self-pay | Admitting: Pharmacist

## 2021-08-15 ENCOUNTER — Telehealth: Payer: Self-pay | Admitting: Family Medicine

## 2021-08-15 ENCOUNTER — Ambulatory Visit (INDEPENDENT_AMBULATORY_CARE_PROVIDER_SITE_OTHER): Payer: Medicare (Managed Care) | Admitting: Pharmacist

## 2021-08-15 DIAGNOSIS — Z794 Long term (current) use of insulin: Secondary | ICD-10-CM

## 2021-08-15 DIAGNOSIS — E1142 Type 2 diabetes mellitus with diabetic polyneuropathy: Secondary | ICD-10-CM | POA: Diagnosis not present

## 2021-08-15 NOTE — Telephone Encounter (Signed)
Patient is dropping off insurance form to be completed by Dr. Larita Fife. LDOS 08-09-21.  Patient would like for this form to be mailed to the address listed when completed.   I have left form in white team box.

## 2021-08-15 NOTE — Patient Instructions (Signed)
Ms. Comes it was a pleasure seeing you today.   The sensor is small waterproof disc that is placed on the back of the upper arm.  There is a very thin filament that is inserted under the surface of the skin and measures the amount of glucose in the interstitial fluid.  This system collects your sugar levels for up to 14 days and it automatically records the glucose level every 15 minutes. This will show your provider any patterns in your glucose levels.  Please remember... 1. Sensor will last 14 days 2. Sensor should be applied to area away from scarring, tattoos, irritation, and bones. 3. Starting the sensor: 1 hour warm up before BG readings available   4. Scan the sensor at least every 8 hours 5. Hold reader within 1.5 inches of sensor to scan 6. When the blood drop and magnifying glass symbol appears, test fingerstick blood glucose prior to making treatment decisions 7. Do a fingerstick blood glucose test if the sensor readings do not match how you feel 8. Remove sensor prior to magnetic resonance imaging (MRI), computed tomography (CT) scan, or high-frequency electrical heat (diathermy) treatment. 9. Freestyle Libre may be worn through a Environmental education officer. It may not be exposed to an advanced Imaging Technology (AIT) body scanner (also called a millimeter wave scanner) or the baggage x-ray machine. Instead, ask for hand-wanding or full-body pat-down and visual inspection.  10. Doses of vitamin C (ascorbic acid) >500 mg every day may cause false high readings. 11. Do not submerge more than 3 feet or keep underwater longer than 30 minutes at a time. Gently pat to dry.  12. Store sensor kit between 39 and 77 degrees Farenheit. Can be refrigerated within this temperature range.  Problems with Freestyle Libre sticking? 1. Order Tegaderm I.V. films to place directly over [facility] sensor on arm. 2. May also order Skin Tac from [facility]. Alcohol swab area you plan to administer  Freestyle Libre then let dry. Once dry, apply Skin Tac in a circular motion (with a spot in the middle for sensor without skin tac) and let dry. Once dry you can apply Freestyle Libre   Problems taking off Freestyle [place]? 1. Remember to try to shower before removing Freestyle Libre 2. Order Tac Away to help remove any extra adhesive left on your skin once you remove Freestyle Libre 3. May also try baby oil to loosen adhesive  Central Phone number: [phone] Available 7 days a week; excluding holidays 8 AM to 8PM EST  [email or web address]  Please do the following:  Continue your regimen as directed today during your appointment. If you have any questions or if you believe something has occurred because of this change, call me or your doctor to let one of [place] know.  Restart Ozempic 0.25 mg inject once weekly. Continue checking blood sugars at home. It's really important that you record these and bring these in to your next doctor's appointment.  Continue making the lifestyle changes we've discussed together during our visit. Diet and exercise play a significant role in improving your blood sugars.  Follow-up with me after Rosendo Gros reaches out to you regarding medications.  My phone number: [phone]  Hypoglycemia or low blood sugar:   Low blood sugar can happen quickly and may become an emergency if not treated right away.   While this shouldn't happen often, it can be brought upon if you skip a meal or do not eat enough. Also, if your insulin  or other diabetes medications are dosed too high, this can cause your blood sugar to go to low.   Warning signs of low blood sugar include: Feeling shaky or dizzy Feeling weak or tired  Excessive hunger Feeling anxious or upset  Sweating even when you aren't exercising  What to do if I experience low blood sugar? Follow the Rule of 15 Check your blood sugar. If lower than 70, proceed to step 2.  Treat with 15 grams of  fast acting carbs which is found in 3-4 glucose tablets. If none are available you can try hard candy, 1 tablespoon of sugar or honey,4 ounces of fruit juice, or 6 ounces of REGULAR soda.  Re-check your sugar in 15 minutes. If it is still below 70, do what you did in step 2 again. If your blood sugar has come back up, go ahead and eat a snack or small meal made up of complex carbs (ex. Whole grains) and protein at this time to avoid recurrence of low blood sugar.

## 2021-08-15 NOTE — Progress Notes (Signed)
Subjective:    Patient ID: Cassandra Reid, female    DOB: 1956-06-22, 65 y.o.   MRN: [id number]   Patient is a 65 y.o. female who presents for diabetes management. She is in good spirits and presents without assistance. Patient referred on 05/18/21 and last seen by Primary Care Provider on 06/05/21. Patient last seen in pharmacy clinic 07/06/21.  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 Patient reports adherence with medications.  Do you feel that your medications are working for you?  yes  Have you been experiencing any side effects to the medications prescribed? no  Do you have any problems obtaining medications due to transportation or finances?  Recently approved for patient assistance   Patient reports waking up frequently throughout the night, once felt lightheaded and like she was "fading away", and sensor reported a low. Also dizzy spell last week but thinks was due to some fluid build up behind the ears, not feeling dizzy today. Reports awareness of hypoglycemic events. When she goes low, takes 1 glucose tablet, waits 15 minutes, and then takes another one or drinks a cup of orange juice if she is still low. Checks glucose on sensor when she is low.  Patient reports vision getting worse over the last several years. Feels tingling in the fingers and toes most of the time. Checks feet and toes regularly  Meals - 1-2 meals a day Breakfast - toast in the morning Lunch - sandwich Dinner- toast, 1-2 hard boiled eggs Will be moving soon to a place with a bigger kitchen sooner, easier to cook  meals.  Had 5th COVID-19 vaccine on Sept 16th  Freestyle Libre 2.0 patient education Person(s)instructed: patient  Patient reports sensor is easy to use and hasn't had trouble with it falling off or losing stickiness. Has received home supply of sensors.  Patient taking >500 mg Vitamin C: denies Reminded patient to not take Vitamin C with Freestyle Libre.   Objective:     Labs:   Physical Exam Constitutional:      Appearance: Normal appearance.  Neurological:     Mental Status: She is alert and oriented to person, place, and time.  Psychiatric:        Mood and Affect: Mood normal.        Behavior: Behavior normal.        Thought Content: Thought content normal.        Judgment: Judgment normal.    Lab Results  Component Value Date   HGBA1C 8.9 (A) 07/28/2021   HGBA1C 7.9 (A) 05/01/2021   HGBA1C 9.9 (H) 12/09/2020    Lab Results  Component Value Date   MICRALBCREAT 30-300 11/10/2020    Lipid Panel     Component Value Date/Time   CHOL 120 07/31/2019 1339   TRIG 242 (H) 07/31/2019 1339   HDL 42 (L) 07/31/2019 1339   CHOLHDL 2.9 07/31/2019 1339   VLDL 38.2 07/18/2018 0940   LDLCALC 48 07/31/2019 1339    Clinical Atherosclerotic Cardiovascular Disease (ASCVD): No  The ASCVD Risk score (Arnett DK, et al., 2019) failed to calculate for the following reasons:   The valid total cholesterol range is 130  to 320 mg/dL    Assessment/Plan:  Z6XW is not controlled likely due to financial barriers and patient not having access to medications. Was recently approved for patient assistance a few days ago, so going forward should have no issues obtaining meds. Medication adherence appears optimal. Following instruction patient verbalized understanding of treatment plan.    Provided patient sample of Ozempic 0.25 mg once weekly. Continued Tresiba 42 units once daily, provided patient with sample. Continued insulin aspart (Fiasp) 20 units with breakfast and 15 units with  dinner (2-3x/day depending on if patient skips full meal at dinnertime), provided patient with sample. Continued metformin XR 750mg  BID Continued Farxiga 10 mg once daily Extensively discussed pathophysiology of diabetes, dietary effects on blood sugar control, and recommended lifestyle interventions. Counseled on s/sx of and management of hypoglycemia Counseled on checking blood sugar with a finger stick (instead of sensor) when low after taking a glucose tablet and waiting 15 minutes Next A1C anticipated in December 2022  Follow-up appointment 3-4 weeks to review sugar readings. Written patient instructions provided.  This appointment required 20 minutes of direct patient care.  Thank you for involving pharmacy to assist in providing this patient's care.  The patient is currently using Continuous Glucose Monitoring. The patient is injecting insulin 4 times a day and was previously testing blood glucose 4 times a day. The patient is making adjustments to their diabetes regimen based on glucose readings.    Medication Samples have been provided to the patient.  Drug name: January 2023       Strength:    200 units/mL    Qty: 1 pen  LOT: Evaristo Bury  Exp.Date: 05/18/22  Drug name: 05/20/22      Strength: 100 units/mL        Qty: 3 pens  LOT: Candie Mile  Exp.Date: 01/17/23

## 2021-08-16 NOTE — Assessment & Plan Note (Signed)
T2DM is not controlled likely due to financial barriers and patient not having access to medications. Was recently approved for patient assistance a few days ago, so going forward should have no issues obtaining meds. Medication adherence appears optimal. Following instruction patient verbalized understanding of treatment plan.    1. Provided patient sample of Ozempic 0.25 mg once weekly. 2. Continued Tresiba 42 units once daily, provided patient with sample. 3. Continued insulin aspart (Fiasp) 20 units with breakfast and 15 units with dinner (2-3x/day depending on if patient skips full meal at dinnertime), provided patient with sample. 4. Continued metformin XR 750mg  BID 5. Continued Farxiga 10 mg once daily 6. Extensively discussed pathophysiology of diabetes, dietary effects on blood sugar control, and recommended lifestyle interventions. 7. Counseled on s/sx of and management of hypoglycemia 8. Counseled on checking blood sugar with a finger stick (instead of sensor) when low after taking a glucose tablet and waiting 15 minutes 9. Next A1C anticipated in December 2022  Follow-up appointment 3-4 weeks to review sugar readings. Written patient instructions provided.

## 2021-08-17 MED ORDER — TRESIBA FLEXTOUCH 200 UNIT/ML ~~LOC~~ SOPN: 42.0000 [IU] | PEN_INJECTOR | Freq: Every day | SUBCUTANEOUS | 0 refills | Status: DC

## 2021-08-21 NOTE — Telephone Encounter (Signed)
Clinical info completed on insurance form.  Place form in Dr. Jerolyn Center box for completion.  Sunday Spillers, CMA

## 2021-08-23 NOTE — Telephone Encounter (Signed)
Completed forms. Will have copy placed in chart. Will fax to number indicated and mail original copy to Ms. Gracelyn Nurse as requested.   Fayette Pho, MD

## 2021-08-24 ENCOUNTER — Other Ambulatory Visit: Payer: Self-pay | Admitting: Family Medicine

## 2021-08-24 DIAGNOSIS — F419 Anxiety disorder, unspecified: Secondary | ICD-10-CM

## 2021-08-24 DIAGNOSIS — E1169 Type 2 diabetes mellitus with other specified complication: Secondary | ICD-10-CM

## 2021-08-24 DIAGNOSIS — F32A Depression, unspecified: Secondary | ICD-10-CM

## 2021-08-25 ENCOUNTER — Telehealth: Payer: Self-pay

## 2021-08-25 NOTE — Telephone Encounter (Signed)
SPOKE WITH PT REGARDING MEDICATIONS BEING READY FOR PICKUP.  MEDICATIONS ARE READY & LABELED IN MED ROOM [place] (FIASP, TRESIBA, & OZEMPIC)

## 2021-08-26 ENCOUNTER — Other Ambulatory Visit: Payer: Self-pay | Admitting: Family Medicine

## 2021-08-26 DIAGNOSIS — M797 Fibromyalgia: Secondary | ICD-10-CM

## 2021-08-26 DIAGNOSIS — M62838 Other muscle spasm: Secondary | ICD-10-CM

## 2021-08-26 DIAGNOSIS — G894 Chronic pain syndrome: Secondary | ICD-10-CM

## 2021-08-27 ENCOUNTER — Encounter: Payer: Self-pay | Admitting: Family Medicine

## 2021-08-28 NOTE — Telephone Encounter (Signed)
Patient presents to clinic to pick up medications.   Medications provided per previous note.   Veronda Prude, RN

## 2021-08-30 ENCOUNTER — Other Ambulatory Visit: Payer: Self-pay | Admitting: Family Medicine

## 2021-09-05 ENCOUNTER — Other Ambulatory Visit: Payer: Medicare (Managed Care)

## 2021-09-06 ENCOUNTER — Ambulatory Visit: Payer: Medicare (Managed Care) | Admitting: Licensed Clinical Social Worker

## 2021-09-06 DIAGNOSIS — Z7189 Other specified counseling: Secondary | ICD-10-CM

## 2021-09-06 DIAGNOSIS — Z789 Other specified health status: Secondary | ICD-10-CM

## 2021-09-06 NOTE — Patient Instructions (Signed)
Visit Information   Goals Addressed             This Visit's Progress    Coping Skills Enhanced by connecting with mental health provider   On track    Patient Goals/Self-Care Activities: Over the next 90 days Continue to work with [facility] for counseling  Continue taking Lexapro Review your EMMI Education on Relieving Stress and Getting a Good night sleep I have sent new EMMI education on  Movement: Emotional Health; Managing Anxiety around daily task Continue to connect to your support group Start Silver Sneaker program      COMPLETED: Find Help in My Community       Timeframe:  Short-Term Goal Priority:  High Start Date:   06/26/2021                          Expected End Date:                        Patient Goals/Self-Care Activities: Over the next 30 days  Congratulations on finding a new apartment  Keep appointment with [employer] for legal community support [phone]        Patient verbalizes understanding of instructions provided today and agrees to view in [place].   It was a pleasure speaking with you today. Please call the office if needed No follow up scheduled, per our conversation I will contact you in 90 days.   Sammuel Hines, LCSW Care Management & Coordination  [phone]

## 2021-09-06 NOTE — Chronic Care Management (AMB) (Signed)
Care Management  Clinical Social Work Note  09/06/2021 Name: Cassandra Reid MRN: [id number] DOB: 09/18/1956  Cassandra Reid is a 65 y.o. year old female who is a primary care patient of Fayette Pho, MD. The CCM team was consulted for assistance with care coordination needs. Transportation Needs , Programmer, applications , and Mental Health Counseling and Resources   Consent to Services:  The patient was given information about Care Management services, agreed to services, and gave verbal consent prior to initiation of services.  Please see initial visit note for detailed documentation.   Patient agreed to services today and consent obtained.  Engaged with patient by telephone for follow up visit in response to provider referral for social work care coordination services.   Assessment/Interventions: Assessed patient's current treatment, progress, coping skills, support system and barriers to care.  She is making progress with goals. She has connected with supportives therapies, moved into new apartment, has appointment with [facility] to discuss new health insurance options and will start the Silver Sneaker program through her insurance .  See Care Plan below for interventions and patient self-care actives.  Recommendation: Patient may benefit from, and is in agreement to continue with interventions discussed today and keep supportive therapy appointments.   Follow up Plan: Patient would like continued follow-up from CCM LCSW .  per patient's request will follow up in 90 days.  Will call office if needed prior to next encounter.   Review of patient past medical history, allergies, medications, and health status, including review of pertinent consultant reports was performed as part of comprehensive evaluation and provision of care management/care coordination services.   SDOH (Social Determinants of Health) screening and interventions performed today:   Advanced Directives Status:See Vynca  application for related entries.     Care Plan    Conditions to be addressed/monitored per PCP order: Anxiety and Depression,   Care Plan : General Social Work (Adult)  Updates made by Soundra Pilon, LCSW since 09/06/2021 12:00 AM     Problem: Emotional Distress      Goal: Emotional Health Supported/ connect with mental health provider   Start Date: 06/08/2021  This Visit's Progress: On track  Recent Progress: On track  Priority: High  Note:   Current barriers:   Financial constraints related to fixed income and unable pay co-pays, Limited social support, and Mental Health Concerns  Needs Support, Education, and Care Coordination in order to meet unmet mental health needs. Clinical Goal(s): patient will work with SW to address concerns related to stress and connecting with mental health provider patient will work with agencies discussed today to address needs related to managing her mental health  Clinical Interventions:  Motivational Interviewing employed Solution-Focused Strategies employed: ongoing support for self-care Active listening / Reflection utilized  Industrial/product designer Provided Behavioral Activation reviewed Problem Solving /Task Center strategies reviewed Provided brief CBT  Provided EMMI education information on Movement: Emotional Health; Managing Anxiety around daily task   Inter-disciplinary care team collaboration (see longitudinal plan of care)    Patient Goals/Self-Care Activities: Over the next 90 days Continue to work with [employer] for counseling  Continue taking Lexapro Review your EMMI Education on Relieving Stress and Getting a Good night sleep I have sent new EMMI education on  Movement: Emotional Health; Managing Anxiety around daily task Continue to connect to your support group Start Silver Sneaker program     Problem: SDOH/Needs new housing and community resources  Goal: Coping Skills Enhanced by connecting with  community Support Completed 09/06/2021  Start Date: 06/26/2021  Recent Progress: On track  Priority: High  Note:   Current barriers:    Limited social support, Transportation, Housing barriers, Mental Health Concerns , and Family and relationship dysfunction Has located new apartment but does not have assistance with moving Clinical Goals: Patient will work with agencies discussed to address needs related to locating new housing and health insurance provider Clinical Interventions:  Inter-disciplinary care team collaboration (see longitudinal plan of care) Assessment of needs, barriers , agencies contacted, as well as how impacting Review various resources, discussed options and provided patient information ; Discussed Women's resources center, options and support. provided phone number; uses [facility] and [facility] transportation to medical appointments;  Referral ([facility] Has spoken with [facility]) for insurance options Will be moving to new apartment Oct 14th  Solution-Focused Strategies, Active listening / Reflection utilized and Emotional Supportive Provided,  Patient Goals/Self-Care Activities: Over the next 30 days Congratulations on finding a new apartment  Keep appointment with [employer] for legal community support [phone]    Sammuel Hines, LCSW Care Management & Coordination  [facility] / [employer]   [phone]

## 2021-09-07 ENCOUNTER — Telehealth: Payer: Self-pay

## 2021-09-07 NOTE — Telephone Encounter (Signed)
SPOKE WITH PT REGARDING PEN NEEDLE PICKUP. 5 BOXES ARE READY & LABELED IN MED ROOM. PT CAN PICK THEM UP AT HER APPT 09/13/21.

## 2021-09-13 ENCOUNTER — Telehealth (INDEPENDENT_AMBULATORY_CARE_PROVIDER_SITE_OTHER): Payer: Medicare (Managed Care) | Admitting: Pharmacist

## 2021-09-13 DIAGNOSIS — Z794 Long term (current) use of insulin: Secondary | ICD-10-CM

## 2021-09-13 DIAGNOSIS — E1142 Type 2 diabetes mellitus with diabetic polyneuropathy: Secondary | ICD-10-CM

## 2021-09-13 NOTE — Progress Notes (Signed)
Subjective:    Patient ID: Cassandra Reid, female    DOB: 1956-03-18, 65 y.o.   MRN: [id number]  HPI Patient is a 65 y.o. female who presents for diabetes management and Dexcom follow-up. She is in good spirits and presents via video visit. Patient referred on 05/18/21 and last seen by Primary Care Provider on 06/05/21. Last seen in pharmacy clinic on 08/15/21.  I connected with  [name] on 09/13/21 by a video enabled telemedicine application and verified that I am speaking with the correct person using two identifiers.   I discussed the limitations of evaluation and management by telemedicine. The patient expressed understanding and agreed to proceed. Patient located at home provider located in office.  Patient reports she has been having back pain but it is alleviated by using a heating pad. 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 Patient reports adherence with medications.  Do you feel that your medications are working for you?  yes  Have you been experiencing any side effects to the medications prescribed? no  Do you have any problems obtaining medications due to transportation or finances?  No; recently approved for patient assistance   Patient reported dietary habits:  Eats 1-2 meals/day  Breakfast - toast in the morning Lunch - sandwich Dinner- toast, 1-2 hard boiled eggs   Patient reports hypoglycemic events which she states occur later in evening before bedtime Patient reports neuropathy (nerve pain). Patient reports visual changes. Patient reports self foot exams.    Dexcom G6 patient education Patient taking >1 gram acetaminophen every 6 hours: denies Patient taking hydroxyrea: denies.  Sensor application --  Site selection and site prep with alcohol pad Sensor prep-sensor pack and sensor applicator Sensor applied to area away from waistband, scarring, tattoos, irritation, and bones Transmitter sanitized with alcohol pad and inserted into sensor. Starting the sensor: 2 hour warm up before BG readings available Sensor change every 10 days and rotate site Call Dexcom customer service if sensor comes off before 10 days  Safety and Troubleshooting Do a fingerstick blood glucose test if the sensor readings do not match how you feel Remove sensor prior to magnetic resonance imaging (MRI), computed tomography (CT) scan, or high-frequency electrical heat (diathermy) treatment. Do not allow sun screen or insect repellant to come into contact with Dexcom G6. These skin care products may lead for the plastic used in the Dexcom G6 to crack. Dexcom G6 may be worn through a Environmental education officer. It may not be exposed to an advanced Imaging Technology (AIT) body scanner (also called a millimeter wave scanner) or the baggage x-ray machine. Instead, ask for hand-wanding or full-body pat-down and visual inspection.  Doses of acetaminophen (Tylenol) >1 gram every 6 hours may cause false high readings. Hydroxyurea (Hydrea, Droxia) may interfere with accuracy of blood glucose readings from Dexcom G6. Store sensor kit between 36 and 86 degrees Farenheit. Can be refrigerated within this temperature range.  Contact information provided for [facility] customer service and/or trainer.   Objective:     Labs:   Physical Exam Neurological:     Mental Status: She is alert and  oriented to person, place, and time.    Review of Systems  Gastrointestinal:  Negative for nausea and vomiting.   Lab Results  Component Value Date   HGBA1C 8.9 (A) 07/28/2021   HGBA1C  7.9 (A) 05/01/2021   HGBA1C 9.9 (H) 12/09/2020    Lab Results  Component Value Date   MICRALBCREAT 30-300 11/10/2020    Lipid Panel     Component Value Date/Time   CHOL 120 07/31/2019 1339   TRIG 242 (H) 07/31/2019 1339   HDL 42 (L) 07/31/2019 1339   CHOLHDL 2.9 07/31/2019 1339   VLDL 38.2 07/18/2018 0940   LDLCALC 48 07/31/2019 1339    Assessment/Plan:   T2DM is not controlled but beginning to improve likely due to elimination of financial burden of medications via patient assistance. Medication adherence appears optimal. Will increase dose of Ozempic and decrease dose of [place] and Fiasp to hopefully help alleviate low blood glucose. Will also provide patient sample of Jardiance to as CGM data from above demonstrates regimen without SGLT2 Wilder Glade) as patient has been out of medication for one week. Following instruction patient verbalized understanding of treatment plan.    Increased dose of Ozempic to 0.$RemoveBefo'5mg'OyBWaWXbRHK$  once weekly. Decrease Tresiba to 40 units once daily, provided patient with sample. Decreased insulin aspart (Fiasp) to 15 units with breakfast and 15 units with dinner (2-3x/day depending on if patient skips full meal at dinnertime), provided patient with sample. Provided sample of Jardiance $RemoveBefo'25mg'ulugYLjpTtJ$  Extensively discussed pathophysiology of diabetes, dietary effects on blood sugar control, and recommended lifestyle interventions. Counseled on s/sx of and management of hypoglycemia Counseled on checking blood sugar with a finger stick (instead of sensor) when low after taking a glucose tablet and waiting 15 minutes Next A1C anticipated in December 2022  Follow-up appointment 2 weeks to review sugar readings. Written patient instructions provided.  This appointment required 20 minutes of direct patient care.  Thank you for involving pharmacy to assist in providing this patient's care.  The patient is currently using Continuous Glucose Monitoring. The patient is injecting  insulin 4 times a day and was previously testing blood glucose 4 times a day. The patient is making adjustments to their diabetes regimen based on glucose readings.    Medication Samples have been provided to the patient.  Drug name: Jardiance       Strength: $RemoveBe'25mg'sDGEnsnBT$         Qty: 4 boxes  LOT:  [id number]  Exp.Date:  08/18/22  The patient has been instructed regarding the correct time, dose, and frequency of taking this medication, including desired effects and most common side effects.   Hughes Better 10:59 AM 09/18/2021

## 2021-09-14 ENCOUNTER — Other Ambulatory Visit: Payer: Self-pay | Admitting: Family Medicine

## 2021-09-14 DIAGNOSIS — K219 Gastro-esophageal reflux disease without esophagitis: Secondary | ICD-10-CM

## 2021-09-15 MED ORDER — FAMOTIDINE 20 MG PO TABS: 20.0000 mg | ORAL_TABLET | Freq: Two times a day (BID) | ORAL | 3 refills | Status: DC

## 2021-09-18 ENCOUNTER — Encounter: Payer: Self-pay | Admitting: Family Medicine

## 2021-09-18 ENCOUNTER — Other Ambulatory Visit: Payer: Self-pay

## 2021-09-18 ENCOUNTER — Ambulatory Visit: Payer: Medicare (Managed Care) | Admitting: Family Medicine

## 2021-09-18 VITALS — BP 140/60 | HR 80 | Ht 61.0 in | Wt 175.0 lb

## 2021-09-18 DIAGNOSIS — R159 Full incontinence of feces: Secondary | ICD-10-CM

## 2021-09-18 DIAGNOSIS — R2689 Other abnormalities of gait and mobility: Secondary | ICD-10-CM | POA: Diagnosis not present

## 2021-09-18 DIAGNOSIS — N3942 Incontinence without sensory awareness: Secondary | ICD-10-CM | POA: Diagnosis not present

## 2021-09-18 DIAGNOSIS — G4733 Obstructive sleep apnea (adult) (pediatric): Secondary | ICD-10-CM | POA: Diagnosis not present

## 2021-09-18 NOTE — Assessment & Plan Note (Signed)
T2DM is not controlled but beginning to improve likely due to elimination of financial burden of medications via patient assistance. Medication adherence appears optimal. Will increase dose of Ozempic and decrease dose of [place] and Fiasp to hopefully help alleviate low blood glucose. Will also provide patient sample of Jardiance to as CGM data from above demonstrates regimen without SGLT2 Marcelline Deist) as patient has been out of medication for one week. Following instruction patient verbalized understanding of treatment plan.    1. Increased dose of Ozempic to 0.5mg  once weekly. 2. Decrease Tresiba to 40 units once daily, provided patient with sample. 3. Decreased insulin aspart (Fiasp) to 15 units with breakfast and 15 units with dinner (2-3x/day depending on if patient skips full meal at dinnertime), provided patient with sample. 4. Provided sample of Jardiance 25mg  5. Extensively discussed pathophysiology of diabetes, dietary effects on blood sugar control, and recommended lifestyle interventions. 6. Counseled on s/sx of and management of hypoglycemia 7. Counseled on checking blood sugar with a finger stick (instead of sensor) when low after taking a glucose tablet and waiting 15 minutes 8. Next A1C anticipated in December 2022  Follow-up appointment 2 weeks to review sugar readings. Written patient instructions provided.

## 2021-09-18 NOTE — Progress Notes (Signed)
SUBJECTIVE:   CHIEF COMPLAINT / HPI:   Sleep apnea - has had sleep apnea for years and years - had sleep study done 8 years ago in a different state - hose broke about a year ago - cannot remember what the CPAP settings were  Pinching pain - arms, chest, legs, back - feels like bug bite but none there  Uncontrolled crying - has gotten much worse  - out of no where - suddenly crying for no reason, no sad thoughts preceding - will stop on a dime once she feels like it's released  Constipation Loss of bladder control Nausea, bloating - can barely make it to the bathroom in time upon waking - urgency - always happens with sneezing, coughing - can also happen with standing up and walking - has lost bladder without warning, every day, has gotten worse over last couple of months - has also lost bowel control once or twice ever   Trouble swallowing pills - new x couple months  - subjective stuck sensation with pills - can eat food without problem - has to allow pills to dissolve before swallowing  Off balance at times - ear issue couple months ago, fluid behind ears - treated with allergy medication - feels off balance - doesn't feel like she can walk straight - is bumping into walls - has fallen a couple weeks ago while walking the dog and got tied up in leash  Lower back ache - after bending over, feels like "something crunched" and is hard to walk - relieved by heating pad, stretches - more frequent in the last couple of months  Muscle spasms - legs and lower back - feels like someone is grabbing muscle and twisting - tight spasms  Sees Dr. Zachery DakinsZdeb of [facility] in [place] - wanted to do injections and PT but insurance co-pays too high or wouldn't cover  PERTINENT  PMH / PSH:  Past Medical History:  Diagnosis Date   Allergic rhinitis 09/26/2018   Anxiety    Arthritis    Asthma    Brain aneurysm 07/18/2018   Brain injury due to ischemia  11/19/2002   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 difficile colitis 07/18/2018   Cerebral aneurysm without rupture    Cerebral aneurysm without rupture    Chest pain 03/26/2020   Chronic kidney disease    CKD stage 3 due to type 2 diabetes mellitus (HCC) 04/15/2019   Depression    Diabetes mellitus without complication (HCC)    Fibromyalgia    Former smoker    GERD (gastroesophageal reflux disease)    Heart murmur    History of chicken pox    History of colon polyps    Hyperlipidemia associated with type 2 diabetes mellitus (HCC) 07/18/2018   Hypertension    Hypertension associated with diabetes (HCC) 07/18/2018   IBS (irritable bowel syndrome)    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   Type 2 diabetes mellitus with hyperglycemia, with long-term current use of insulin (HCC) 12/03/2019  OBJECTIVE:   BP 140/60   Pulse 80   Ht 5\' 1"  (1.549 m)   Wt 175 lb (79.4 kg)   SpO2 96%   BMI 33.07 kg/m   PHQ-9:  Depression screen [facility] 2/9 09/18/2021 07/28/2021 06/05/2021  Decreased Interest 0 2 2  Down, Depressed, Hopeless 0 3 2  PHQ - 2 Score 0 5 4  Altered sleeping 0 2 3  Tired, decreased energy 0 1 2  Change in appetite 0 0 1  Feeling bad or failure about yourself  0 2 2  Trouble concentrating 0 0 1  Moving slowly or fidgety/restless 0 0 0  Suicidal thoughts 0 0 0  PHQ-9 Score 0 10 13  Difficult doing work/chores - Very difficult -  Some recent data might be hidden     GAD-7:  GAD 7 : Generalized Anxiety Score 06/05/2021 09/08/2020 08/11/2020 05/26/2020   Nervous, Anxious, on Edge 3 3 3 3   Control/stop worrying 3 3 3 3   Worry too much - different things 3 3 3 3   Trouble relaxing 3 3 3 3   Restless 0 3 - 1  Easily annoyed or irritable 2 0 0 1  Afraid - awful might happen 3 3 3 1   Total GAD 7 Score 17 18 - 15  Anxiety Difficulty - Very difficult - Somewhat difficult     Physical Exam General: Awake, alert, oriented, no acute distress, anxious affect Respiratory: Unlabored respirations, speaking in full sentences, no respiratory distress Neuro: Cranial nerves II through X grossly intact, Romberg negative, difficulty balancing with foot extended (R worse than L), normal gait, difficulty with tandem steps Psych: Normal insight and judgement   ASSESSMENT/PLAN:   OSA (obstructive sleep apnea) Stable, affecting sleep. Machine broke one year ago. Was diagnosed and given CPAP settings 8 years ago in different state, cannot recall settings. Will send for sleep study to have treatment settings updated.   Balance problems Insidious worsening. Romberg negative. Will send for neurovestibular rehab.   Incontinence Worsening. Superimposed on stress and urgency urinary incontinence. Given worsening, will obtain MRI without contrast to r/o spinal cord involvement such as cauda equina syndrome.      07/27/2020, MD [facility]

## 2021-09-18 NOTE — Patient Instructions (Signed)
It was wonderful to see you today. Thank you for allowing me to be a part of your care. Below is a short summary of what we discussed at your visit today:  Bowel and bladder incontinence We will send you for an MRI of your lower back to make sure you do not have any spinal cord issues causing the incontinence. The nurse is calling to schedule this and will let you know the day and time.   Sleep apnea I have ordered a home sleep study for you so we may get updated settings for your CPAP and get you started back on CPAP at night with the proper equipment.   Follow up In one month with any resident, including myself (if available through cancellation or whatnot). You last saw Dr. Katha Cabal and liked her if you would prefer to see her again too.       Please bring all of your medications to every appointment!  If you have any questions or concerns, please do not hesitate to contact us via phone or MyChart message.   Fayette Pho, MD

## 2021-09-19 ENCOUNTER — Telehealth: Payer: Self-pay | Admitting: *Deleted

## 2021-09-19 NOTE — Telephone Encounter (Signed)
Certified letter was returned today (09/19/21).  Letter contained a "life Waiver update form".  Reason for return: Insufficient address, unable to forward.  To PCP. Jone Baseman, CMA

## 2021-09-19 NOTE — Progress Notes (Signed)
[place] for F[place]XIGA [place].   Phone: [phone]   

## 2021-09-20 ENCOUNTER — Encounter: Payer: Self-pay | Admitting: Family Medicine

## 2021-09-20 NOTE — Telephone Encounter (Signed)
Resent certified letter to address that is different in chart. Jone Baseman, CMA

## 2021-09-21 ENCOUNTER — Other Ambulatory Visit: Payer: Self-pay | Admitting: Family Medicine

## 2021-09-21 DIAGNOSIS — M62838 Other muscle spasm: Secondary | ICD-10-CM

## 2021-09-21 DIAGNOSIS — M797 Fibromyalgia: Secondary | ICD-10-CM

## 2021-09-21 DIAGNOSIS — G894 Chronic pain syndrome: Secondary | ICD-10-CM

## 2021-09-22 DIAGNOSIS — R32 Unspecified urinary incontinence: Secondary | ICD-10-CM

## 2021-09-22 DIAGNOSIS — R2689 Other abnormalities of gait and mobility: Secondary | ICD-10-CM | POA: Insufficient documentation

## 2021-09-22 HISTORY — DX: Unspecified urinary incontinence: R32

## 2021-09-22 HISTORY — DX: Other abnormalities of gait and mobility: R26.89

## 2021-09-22 NOTE — Assessment & Plan Note (Signed)
Insidious worsening. Romberg negative. Will send for neurovestibular rehab.

## 2021-09-22 NOTE — Assessment & Plan Note (Signed)
Stable, affecting sleep. Machine broke one year ago. Was diagnosed and given CPAP settings 8 [place] state, cannot recall settings. Will send for sleep study to have treatment settings updated.

## 2021-09-22 NOTE — Assessment & Plan Note (Signed)
Worsening. Superimposed on stress and urgency urinary incontinence. Given worsening, will obtain MRI without contrast to r/o spinal cord involvement such as cauda equina syndrome.

## 2021-09-26 NOTE — Addendum Note (Signed)
Addended by: Henri Medal on: 09/26/2021 02:36 PM   Modules accepted: Orders

## 2021-09-26 NOTE — Progress Notes (Signed)
Subjective:    Patient ID: Cassandra Reid, female    DOB: [birth year], 65 y.o.   MRN: [id number]  HPI Patient is a 65 y.o. female who presents for diabetes management and Dexcom follow-up. She is in good spirits and presents via video visit. Patient referred on 05/18/21 and last seen by Primary Care Provider on 06/05/21. Last seen in pharmacy clinic on 08/15/21. Last seen in pharmacy clinic on 09/13/21.  I connected with  [name] on 09/26/21 by a video enabled telemedicine application and verified that I am speaking with the correct person using two identifiers.   I discussed the limitations of evaluation and management by telemedicine. The patient expressed understanding and agreed to proceed. Patient located at home provider located in office.  Patient states she had reading of 48 last Thursday but did not feel unwell so she checked fingerstick and it was 190. She called Dexcom support and they told her to take off the CGM and replace with a new one and in the meantime treat based on glucometer readings. Patient states that when she removed it the filament was filled with blood to which Dexcom support stated the patient pushed the device too far into her skin and it was sitting in blood rather than interstitial fluid therefore leading to inaccuracies. Patient reports she has a bruise in the area from placement but states she has had no remaining problems with the new Dexcom she placed on her arm.  Insurance coverage/medication affordability: [employer] Medicare, Patient Assistance Program  Current diabetes medications include: semaglutide (Ozempic) 0.5 mg once weekly on Thursdays (completed 1 dose), insulin degludec Evaristo Bury) 40 units once daily, insulin aspart (Fiasp) 15 units BID before meals, metformin XR 750 BID, jardiance 25 mg daily    Patient states that She is taking her medications as prescribed. Patient reports adherence with medications.  Do you feel that your medications are  working for you?  yes  Have you been experiencing any side effects to the medications prescribed? no  Do you have any problems obtaining medications due to transportation or finances?  No; recently approved for patient assistance   Patient reported dietary habits:  Eats 2-3 meals/day  Breakfast - toast or oatmeal w/ eggs Lunch - pasta or chicken w/ potato and veggie Dinner- sandwich or cheese and crackers w/ hard boiled egg   Patient denies hypoglycemic events which she states occur later in evening before bedtime Patient denies increased thirst Patient denies increased urination Patient denies increased hunger. Patient reports neuropathy (nerve pain). Patient reports visual changes. Patient reports self foot exams.   Dexcom G6 patient education Patient taking >1 gram acetaminophen every 6 hours: denies Patient taking hydroxyrea: denies.  Sensor application --  Site selection and site prep with alcohol pad Sensor prep-sensor pack and sensor applicator Sensor applied to area away from waistband, scarring, tattoos, irritation, and bones Transmitter sanitized with alcohol pad and inserted into sensor. Starting the sensor: 2 hour warm up before BG readings available Sensor change every 10 days and rotate site Call Dexcom customer service if sensor comes off before 10 days  Safety and Troubleshooting Do a fingerstick blood glucose test if the sensor readings do not match how you feel Remove sensor prior to magnetic resonance imaging (MRI), computed tomography (CT) scan, or high-frequency electrical heat (diathermy) treatment. Do not allow sun screen or insect repellant to come into contact with Dexcom G6. These skin care products may lead for the plastic used in the Dexcom G6  to crack. Dexcom G6 may be worn through a Environmental education officer. It may not be exposed to an advanced Imaging Technology (AIT) body scanner (also called a millimeter wave scanner) or the baggage x-ray  machine. Instead, ask for hand-wanding or full-body pat-down and visual inspection.  Doses of acetaminophen (Tylenol) >1 gram every 6 hours may cause false high readings. Hydroxyurea (Hydrea, Droxia) may interfere with accuracy of blood glucose readings from Dexcom G6. Store sensor kit between 36 and 86 degrees Farenheit. Can be refrigerated within this temperature range.  Contact information provided for [facility] customer service and/or trainer.   Objective:     Labs:   Physical Exam Neurological:     Mental Status: She is alert and oriented to person, place, and time.    Review of Systems  Gastrointestinal:  Negative for nausea and vomiting.   Lab Results  Component Value Date   HGBA1C 8.9 (A) 07/28/2021   HGBA1C 7.9 (A) 05/01/2021   HGBA1C 9.9 (H) 12/09/2020    Lab Results  Component Value Date   MICRALBCREAT 30-300 11/10/2020    Lipid Panel     Component Value Date/Time   CHOL 120 07/31/2019 1339   TRIG 242 (H) 07/31/2019 1339   HDL 42 (L) 07/31/2019 1339   CHOLHDL 2.9 07/31/2019 1339   VLDL 38.2 07/18/2018 0940   LDLCALC 48 07/31/2019 1339    Assessment/Plan:   T2DM is not controlled but half of above CGM data was inaccurate due to issues with CGM device prior to last Thursday. Medication adherence appears optimal. Continue Ozempic 0.5mg  with plan to continue to titrate up to max dose of 2mg  once weekly. Patient was approved for assistance with Wilder Glade, which she prefers, therefore will have her discontinue Jardiance and start [place] when it arrives at her house. Increased dose of Tresiba from 40 to 44 units once daily. Will also have patient take Fiasp 15 units TID with meals as patient is now consuming 3 meals more frequently rather than previously consuming 2. Following instruction patient verbalized understanding of treatment plan.    Continue GLP1 Ozempic to 0.5mg  once weekly. Increase Tresiba to 44 units once daily, provided patient with sample. Continue  insulin aspart (Fiasp) to 15 units with all meals (TID) Continued SGLT2 of Jardiance 25mg  and discontinue when patient receives [place] in the mail Will initiate Farxiga 10mg  upon receipt Extensively discussed pathophysiology of diabetes, dietary effects on blood sugar control, and recommended lifestyle interventions. Counseled on s/sx of and management of hypoglycemia Counseled on checking blood sugar with a finger stick (instead of sensor) when low after taking a glucose tablet and waiting 15 minutes Next A1C anticipated in December 2022  Follow-up appointment 3 weeks to review sugar readings. Written patient instructions provided.  This appointment required 24 minutes of direct patient care.  Thank you for involving pharmacy to assist in providing this patient's care.

## 2021-09-26 NOTE — Progress Notes (Signed)
Sleep study order location needed to be changed.  Seretha Estabrooks,CMA

## 2021-09-26 NOTE — Progress Notes (Signed)
Received notification from [place] regarding approval for [facility]. Patient assistance approved from 09/25/21 to 11/18/22.  Medication will ship to pt's home  Phone: [phone]

## 2021-09-27 ENCOUNTER — Telehealth (INDEPENDENT_AMBULATORY_CARE_PROVIDER_SITE_OTHER): Payer: Medicare (Managed Care) | Admitting: Pharmacist

## 2021-09-27 ENCOUNTER — Other Ambulatory Visit: Payer: Self-pay

## 2021-09-27 DIAGNOSIS — Z794 Long term (current) use of insulin: Secondary | ICD-10-CM | POA: Diagnosis not present

## 2021-09-27 DIAGNOSIS — E1142 Type 2 diabetes mellitus with diabetic polyneuropathy: Secondary | ICD-10-CM | POA: Diagnosis not present

## 2021-09-27 MED ORDER — DAPAGLIFLOZIN PROPANEDIOL 10 MG PO TABS: 10.0000 mg | ORAL_TABLET | Freq: Every day | ORAL | 0 refills | Status: DC

## 2021-09-27 NOTE — Patient Instructions (Signed)
Miss Daw it was a pleasure seeing you today.   Please do the following:  Increase Tresiba to 44 units and take Fiasp three times a day with your meals as directed today during your appointment. If you have any questions or if you believe something has occurred because of this change, call me or your doctor to let one of [place] know.  Stop Jardiance when Marcelline Deist arrives at your house Continue checking blood sugars at home. It's really important that you record these and bring these in to your next doctor's appointment.  Continue making the lifestyle changes we've discussed together during our visit. Diet and exercise play a significant role in improving your blood sugars.  Follow-up with me in three weeks    Hypoglycemia or low blood sugar:   Low blood sugar can happen quickly and may become an emergency if not treated right away.   While this shouldn't happen often, it can be brought upon if you skip a meal or do not eat enough. Also, if your insulin or other diabetes medications are dosed too high, this can cause your blood sugar to go to low.   Warning signs of low blood sugar include: Feeling shaky or dizzy Feeling weak or tired  Excessive hunger Feeling anxious or upset  Sweating even when you aren't exercising  What to do if I experience low blood sugar? Follow the Rule of 15 Check your blood sugar with your meter. If lower than 70, proceed to step 2.  Treat with 15 grams of fast acting carbs which is found in 3-4 glucose tablets. If none are available you can try hard candy, 1 tablespoon of sugar or honey,4 ounces of fruit juice, or 6 ounces of REGULAR soda.  Re-check your sugar in 15 minutes. If it is still below 70, do what you did in step 2 again. If your blood sugar has come back up, go ahead and eat a snack or small meal made up of complex carbs (ex. Whole grains) and protein at this time to avoid recurrence of low blood sugar.

## 2021-09-27 NOTE — Assessment & Plan Note (Signed)
T2DM is not controlled but half of above CGM data was inaccurate due to issues with CGM device prior to last Thursday. Medication adherence appears optimal. Continue Ozempic 0.5mg  with plan to continue to titrate up to max dose of 2mg  once weekly. Patient was approved for assistance with , which she prefers, therefore will have her discontinue Jardiance and start Marcelline Deist when it arrives at her house. Increased dose of Tresiba from 40 to 44 units once daily. Will also have patient take Fiasp 15 units TID with meals as patient is now consuming 3 meals more frequently rather than previously consuming 2. Following instruction patient verbalized understanding of treatment plan.    1. Continue GLP1 Ozempic to 0.5mg  once weekly. 2. Increase Tresiba to 44 units once daily, provided patient with sample. 3. Continue insulin aspart (Fiasp) to 15 units with all meals (TID) 4. Continued SGLT2 of Jardiance 25mg  and discontinue when patient receives [place] in the mail 5. Will initiate Farxiga 10mg  upon receipt 6. Extensively discussed pathophysiology of diabetes, dietary effects on blood sugar control, and recommended lifestyle interventions. 7. Counseled on s/sx of and management of hypoglycemia 8. Counseled on checking blood sugar with a finger stick (instead of sensor) when low after taking a glucose tablet and waiting 15 minutes 9. Next A1C anticipated in December 2022  Follow-up appointment 3 weeks to review sugar readings.

## 2021-09-29 ENCOUNTER — Ambulatory Visit: Payer: Medicare (Managed Care) | Attending: Family Medicine | Admitting: Physical Therapy

## 2021-09-29 ENCOUNTER — Other Ambulatory Visit: Payer: Self-pay

## 2021-09-29 DIAGNOSIS — R42 Dizziness and giddiness: Secondary | ICD-10-CM | POA: Insufficient documentation

## 2021-09-29 DIAGNOSIS — R262 Difficulty in walking, not elsewhere classified: Secondary | ICD-10-CM | POA: Diagnosis present

## 2021-09-29 DIAGNOSIS — M6281 Muscle weakness (generalized): Secondary | ICD-10-CM | POA: Diagnosis present

## 2021-09-29 DIAGNOSIS — R208 Other disturbances of skin sensation: Secondary | ICD-10-CM | POA: Insufficient documentation

## 2021-09-29 DIAGNOSIS — R2681 Unsteadiness on feet: Secondary | ICD-10-CM | POA: Diagnosis not present

## 2021-09-29 NOTE — Therapy (Signed)
[facility] [address] [place], [place], [address] Phone: [phone]   Fax:  [phone]  Physical Therapy Evaluation  Patient Details  Name: Cassandra Reid MRN: [id number] Date of Birth: 1956/07/21 Referring Provider (PT): [name], MD   Encounter Date: 09/29/2021   PT End of Session - 09/29/21 1223     Visit Number 1    Number of Visits 17    Date for PT Re-Evaluation 11/28/21    Authorization Type [employer] > [employer] in January    Progress Note Due on Visit 10    PT Start Time 0718    PT Stop Time 0803    PT Time Calculation (min) 45 min    Activity Tolerance Patient tolerated treatment well    Behavior During Therapy [facility] for tasks assessed/performed             Past Medical History:  Diagnosis Date   Allergic rhinitis 09/26/2018   Anxiety    Arthritis    Asthma    Brain aneurysm 07/18/2018   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 difficile colitis 07/18/2018   Cerebral aneurysm without rupture    Cerebral aneurysm without rupture    Chest pain 03/26/2020   Chronic kidney disease    CKD stage 3 due to type 2 diabetes mellitus ([place]) 04/15/2019   Depression    Diabetes mellitus without complication ([place])    Fibromyalgia    Former smoker    GERD (gastroesophageal reflux disease)    Heart murmur    History of chicken pox    History of colon polyps    Hyperlipidemia associated with type 2 diabetes mellitus ([place]) 07/18/2018   Hypertension    Hypertension associated with diabetes ([place]) 07/18/2018   IBS (irritable bowel syndrome)    Ischemic brain injury 05/29/2021   Lacunar stroke ([place]) 05/25/2021   Lacunar stroke, History of ([place]) 05/25/2021   Brain MRI 08/25/21: Few small remote lacunar infarcts noted at the right caudate and left internal capsule   Legally blind    Migraines    Myalgia 04/23/2019   Paroxysmal atrial tachycardia ([place])    On  telemetry   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   Type 2 diabetes mellitus with hyperglycemia, with long-term current use of insulin ([place]) 12/03/2019    Past Surgical History:  Procedure Laterality Date   ABDOMINAL HYSTERECTOMY  2012   APPENDECTOMY  1972   CEREBRAL ANEURYSM REPAIR Left 2004   Aneurysm clip in place near the level of the left ICA on Brain MRI 08/2020. Also coiling performed   CHOLECYSTECTOMY  2015   CRANIOTOMY Left    left pterional craniotomy   MENISCUS REPAIR Bilateral     There were no vitals filed for this visit.    Subjective Assessment - 09/29/21 0722     Subjective "I feel off balance", bumps into walls when walking.  Has had one or two episodes of room spinning a couple of weeks ago but had fluid behind her ears; head is still congested but has mostly cleared.  Imbalance began before the congestion; has been going on more than a year. Has a cane and walker but has not been using them.  Had a fall a couple weeks, got tangled up with her dog.  MRI of low back tomorrow.  Pertinent History OSA, anxiety, OA, asthma, brain aneurysm, brain injury due to ischemia, chest pain, CKD due to Type 2 DM, depression, DM, fibromyalgia, heart murmur, HLD, HTN, IBS, lacunar stroke, legally blind, migraines, falls, incontinence - work up for cauda equina syndrome    Diagnostic tests MRI tomorrow    Patient Stated Goals To be able to walk and feel safe    Currently in Pain? No/denies                [facility] PT Assessment - 09/29/21 0728       Assessment   Medical Diagnosis R26.89 (ICD-10-CM) - Balance problem    Referring Provider (PT) Westley Chandler, MD    Onset Date/Surgical Date 09/18/21    Prior Therapy After brain surgery      Precautions   Precautions Other (comment)    Precaution Comments OSA, anxiety, OA, asthma, brain aneurysm, brain injury due to ischemia, chest pain, CKD  due to Type 2 DM, depression, DM, fibromyalgia, heart murmur, HLD, HTN, IBS, lacunar stroke, legally blind, migraines, falls, incontinence - work up for cauda equina syndrome      Balance Screen   Has the patient fallen in the past 6 months Yes    How many times? 1      Home Environment   Living Environment Private residence    Living Arrangements Alone    Type of Home Apartment    Home Access Stairs to enter    Entrance Stairs-Number of Steps 1    Entrance Stairs-Rails None    Home Layout One level    Home Equipment [place] - single point;Walker - 2 wheels;Shower seat;Grab bars - tub/shower    Additional Comments Not driving since brain surgery      Prior Function   Level of Independence Independent    Leisure watching TV and walking her dog      Observation/Other Assessments   Focus on Therapeutic Outcomes (FOTO)  DPS: 52; DFS: 51.5      Sensation   Light Touch Impaired Detail    Light Touch Impaired Details Impaired RLE;Impaired LLE    Hot/Cold Impaired Detail    Hot/Cold Impaired Details Impaired RUE;Impaired LUE      Coordination   Gross Motor Movements are Fluid and Coordinated No    Finger Nose Finger Test Delayed due to cloudy vision    Heel Shin Test delayed      ROM / Strength   AROM / PROM / Strength Strength;AROM      Strength   Overall Strength Deficits    Overall Strength Comments Bilat hip flexion: 4-/5, knee extension 4-/5, knee flexion 3+/5, ankle DF 4/5      Ambulation/Gait   Ambulation/Gait Yes    Ambulation/Gait Assistance 5: Supervision    Ambulation Distance (Feet) 115 Feet    Assistive device None    Gait Pattern Step-through pattern;Decreased step length - right;Decreased step length - left;Decreased stride length;Decreased trunk rotation    Ambulation Surface Level;Indoor      Standardized Balance Assessment   Standardized Balance Assessment Berg Balance Test;10 meter walk test    10 Meter Walk 12 seconds or 2.73 ft/sec      Berg Balance  Test   Sit to Stand Able to stand  independently using hands    Standing Unsupported Able to stand 2 minutes with supervision    Sitting with Back Unsupported but Feet Supported on Floor or Stool Able to sit safely and securely 2 minutes  Stand to Sit Controls descent by using hands    Transfers Able to transfer safely, minor use of hands    Standing Unsupported with Eyes Closed Able to stand 10 seconds with supervision    Standing Unsupported with Feet Together Able to place feet together independently and stand for 1 minute with supervision    From Standing, Reach Forward with Outstretched Arm Can reach confidently >25 cm (10")    From Standing Position, Pick up Object from Floor Able to pick up shoe, needs supervision    From Standing Position, Turn to Look Behind Over each Shoulder Turn sideways only but maintains balance    Turn 360 Degrees Able to turn 360 degrees safely but slowly    Standing Unsupported, Alternately Place Feet on Step/Stool Able to complete 4 steps without aid or supervision    Standing Unsupported, One Foot in Front Able to plae foot ahead of the other independently and hold 30 seconds    Standing on One Leg Able to lift leg independently and hold 5-10 seconds    Total Score 42    Berg comment: 42/56                        Objective measurements completed on examination: See above findings.                PT Education - 09/29/21 1222     Education Details clinical findings, multiple sensory input for balance, PT POC and goals    Person(s) Educated Patient    Methods Explanation    Comprehension Verbalized understanding              PT Short Term Goals - 09/30/21 1856       PT SHORT TERM GOAL #1   Title Pt will demonstrate independence with initial HEP    Time 4    Period Weeks    Status New    Target Date 10/30/21      PT SHORT TERM GOAL #2   Title Pt will participate in cervical ROM and vestibular assessment     Time 4    Period Weeks    Status New    Target Date 10/30/21      PT SHORT TERM GOAL #3   Title Pt will increase gait velocity with LRAD to >/= 3.0 ft/sec    Baseline 2.73 ft/sec, no AD    Time 4    Period Weeks    Status New    Target Date 10/30/21      PT SHORT TERM GOAL #4   Title Pt will increase Berg balance to >/= 46/56    Baseline 42/56    Time 4    Period Weeks    Status New    Target Date 10/30/21      PT SHORT TERM GOAL #5   Title Pt will ambulate outdoors x 500' over paved and grassy terrain with walking stick with supervision and no LOB    Time 4    Period Weeks    Status New    Target Date 10/30/21               PT Long Term Goals - 09/30/21 1859       PT LONG TERM GOAL #1   Title Pt will demonstrate independence with walking program and final HEP    Time 8    Period Weeks    Status New    Target  Date 11/29/21      PT LONG TERM GOAL #2   Title Pt will report increase in FOTO DPS from 52 > 58 and DFS from 51.5 to 56    Time 8    Period Weeks    Status New    Target Date 11/29/21      PT LONG TERM GOAL #3   Title Pt will increase gait velocity with LRAD to >/= 3.2 ft/sec    Time 8    Period Weeks    Status New    Target Date 11/29/21      PT LONG TERM GOAL #4   Title Pt will increase BERG to >/= 50/56 to indicate lower falls risk    Time 8    Period Weeks    Status New    Target Date 11/29/21      PT LONG TERM GOAL #5   Title Cervical ROM or vestibular goal as indicated                    Plan - 09/30/21 1847     Clinical Impression Statement Pt is a 65 year old female referred to Neuro OPPT for evaluation of balance impairments.  Pt's PMH is significant for the following: OSA, anxiety, OA, asthma, brain aneurysm, brain injury due to ischemia, chest pain, CKD due to Type 2 DM, depression, DM, fibromyalgia, heart murmur, HLD, HTN, IBS, lacunar stroke, legally blind, migraines, falls, incontinence - work up for cauda equina  syndrome. The following deficits were noted during pt's exam: impaired vision, impaired LE sensation, bilateral LE weakness, impaired cervical ROM, disequilibrium, impaired standing balance and impaired gait.  Pt's Berg Balance score and gait velocity indicates pt is at significant risk for falls. Pt would benefit from skilled PT to address these impairments and functional limitations to maximize functional mobility independence and reduce falls risk.    Personal Factors and Comorbidities Comorbidity 3+;Fitness;Past/Current Experience;Social Background;Transportation    Comorbidities OSA, anxiety, OA, asthma, brain aneurysm, brain injury due to ischemia, chest pain, CKD due to Type 2 DM, depression, DM, fibromyalgia, heart murmur, HLD, HTN, IBS, lacunar stroke, legally blind, migraines, falls, incontinence - work up for cauda equina syndrome    Examination-Activity Limitations [place];Locomotion Level;Stairs;Stand    Examination-Participation Restrictions Community Activity    Stability/Clinical Decision Making Evolving/Moderate complexity    Clinical Decision Making Moderate    Rehab Potential Good    PT Frequency 2x / week    PT Duration 8 weeks    PT Treatment/Interventions ADLs/Self Care Home Management;Canalith Repostioning;DME Instruction;Gait training;Stair training;Functional mobility training;Therapeutic exercise;Therapeutic activities;Balance training;Neuromuscular re-education;Patient/family education;Orthotic Fit/Training;Passive range of motion;Vestibular;Visual/perceptual remediation/compensation    PT Next Visit Plan Vestibular evaluation (not HIT, not dix-hallpike).  Assess neck AROM.  Discuss use of walking stick instead of cane.  Initiate HEP - walking program (other than walking dog), LE strengthening, standing balance training    Recommended Other Services Has she considered pelvic floor PT for incontinence?    Consulted and Agree with Plan of Care Patient             Patient  will benefit from skilled therapeutic intervention in order to improve the following deficits and impairments:  Decreased balance, Decreased mobility, Decreased range of motion, Decreased strength, Difficulty walking, Dizziness, Impaired sensation, Impaired vision/preception  Visit Diagnosis: Unsteadiness on feet  Difficulty in walking, not elsewhere classified  Muscle weakness (generalized)  Dizziness and giddiness  Other disturbances of skin sensation     Problem  List Patient Active Problem List   Diagnosis Date Noted   Balance problems 09/22/2021   Incontinence 09/22/2021   Healthcare maintenance 07/30/2021   Obesity (BMI 30.0-34.9) 05/25/2021   Anosmia 05/25/2021   Encounter for competency evaluation 05/25/2021   Smoking history 03/29/2021   Osteoporosis 01/18/2021   Foot pain, left 12/09/2020   Encephalomalacia on imaging study 08/25/2020   Fatty liver 05/31/2020   Chronic pain syndrome 04/23/2019   DDD (degenerative disc disease), cervical 04/16/2019   CKD stage 3 due to type 2 diabetes mellitus ([place]) 04/15/2019   Hypertension associated with diabetes ([place]) 07/18/2018   GERD (gastroesophageal reflux disease) 07/18/2018   OSA (obstructive sleep apnea) 07/18/2018   Type 2 diabetes mellitus with diabetic polyneuropathy, with long-term current use of insulin ([place]) 07/18/2018   Fibromyalgia 07/18/2018   Mood disorder ([place]) 07/18/2018   Hyperlipidemia associated with type 2 diabetes mellitus ([place]) 07/18/2018   Legally blind 09/01/2003    Rico Junker, PT, DPT 09/30/21    7:02 PM   [facility] [address] [place], [place], [address] Phone: [phone]   Fax:  [phone]  Name: JARONDA CLUGSTON MRN: [id number] Date of Birth: 10/24/1956

## 2021-09-30 ENCOUNTER — Ambulatory Visit (HOSPITAL_COMMUNITY)
Admission: RE | Admit: 2021-09-30 | Discharge: 2021-09-30 | Disposition: A | Discharge status: Home / Self Care | Payer: Medicare (Managed Care) | Source: Ambulatory Visit | Attending: Family Medicine | Admitting: Family Medicine

## 2021-09-30 DIAGNOSIS — R2689 Other abnormalities of gait and mobility: Secondary | ICD-10-CM | POA: Insufficient documentation

## 2021-09-30 DIAGNOSIS — R159 Full incontinence of feces: Secondary | ICD-10-CM | POA: Insufficient documentation

## 2021-09-30 DIAGNOSIS — N3942 Incontinence without sensory awareness: Secondary | ICD-10-CM | POA: Insufficient documentation

## 2021-10-07 ENCOUNTER — Other Ambulatory Visit: Payer: Self-pay | Admitting: Family Medicine

## 2021-10-07 DIAGNOSIS — K219 Gastro-esophageal reflux disease without esophagitis: Secondary | ICD-10-CM

## 2021-10-15 ENCOUNTER — Emergency Department (HOSPITAL_BASED_OUTPATIENT_CLINIC_OR_DEPARTMENT_OTHER): Payer: Medicare (Managed Care)

## 2021-10-15 ENCOUNTER — Encounter (HOSPITAL_BASED_OUTPATIENT_CLINIC_OR_DEPARTMENT_OTHER): Payer: Self-pay | Admitting: Emergency Medicine

## 2021-10-15 ENCOUNTER — Emergency Department (HOSPITAL_BASED_OUTPATIENT_CLINIC_OR_DEPARTMENT_OTHER)
Admission: EM | Admit: 2021-10-15 | Discharge: 2021-10-15 | Disposition: A | Discharge status: Home / Self Care | Payer: Medicare (Managed Care) | Attending: Emergency Medicine | Admitting: Emergency Medicine

## 2021-10-15 ENCOUNTER — Other Ambulatory Visit: Payer: Self-pay

## 2021-10-15 ENCOUNTER — Other Ambulatory Visit: Payer: Self-pay | Admitting: Family Medicine

## 2021-10-15 DIAGNOSIS — R1011 Right upper quadrant pain: Secondary | ICD-10-CM | POA: Insufficient documentation

## 2021-10-15 DIAGNOSIS — Z794 Long term (current) use of insulin: Secondary | ICD-10-CM | POA: Diagnosis not present

## 2021-10-15 DIAGNOSIS — N183 Chronic kidney disease, stage 3 unspecified: Secondary | ICD-10-CM | POA: Insufficient documentation

## 2021-10-15 DIAGNOSIS — J45909 Unspecified asthma, uncomplicated: Secondary | ICD-10-CM | POA: Diagnosis not present

## 2021-10-15 DIAGNOSIS — Z79899 Other long term (current) drug therapy: Secondary | ICD-10-CM | POA: Insufficient documentation

## 2021-10-15 DIAGNOSIS — E1122 Type 2 diabetes mellitus with diabetic chronic kidney disease: Secondary | ICD-10-CM | POA: Insufficient documentation

## 2021-10-15 DIAGNOSIS — M62838 Other muscle spasm: Secondary | ICD-10-CM

## 2021-10-15 DIAGNOSIS — Z87891 Personal history of nicotine dependence: Secondary | ICD-10-CM | POA: Diagnosis not present

## 2021-10-15 DIAGNOSIS — Z7984 Long term (current) use of oral hypoglycemic drugs: Secondary | ICD-10-CM | POA: Diagnosis not present

## 2021-10-15 DIAGNOSIS — G894 Chronic pain syndrome: Secondary | ICD-10-CM

## 2021-10-15 DIAGNOSIS — I129 Hypertensive chronic kidney disease with stage 1 through stage 4 chronic kidney disease, or unspecified chronic kidney disease: Secondary | ICD-10-CM | POA: Insufficient documentation

## 2021-10-15 DIAGNOSIS — M797 Fibromyalgia: Secondary | ICD-10-CM

## 2021-10-15 LAB — COMPREHENSIVE METABOLIC PANEL
ALT: 50 U/L — ABNORMAL HIGH (ref 0–44)
AST: 31 U/L (ref 15–41)
Albumin: 4.9 g/dL (ref 3.5–5.0)
Alkaline Phosphatase: 98 U/L (ref 38–126)
Anion gap: 13 (ref 5–15)
BUN: 20 mg/dL (ref 8–23)
CO2: 22 mmol/L (ref 22–32)
Calcium: 10.5 mg/dL — ABNORMAL HIGH (ref 8.9–10.3)
Chloride: 104 mmol/L (ref 98–111)
Creatinine, Ser: 0.99 mg/dL (ref 0.44–1.00)
GFR, Estimated: >60 mL/min (ref >60)
Glucose, Bld: 168 mg/dL — ABNORMAL HIGH (ref 70–99)
Potassium: 3.5 mmol/L (ref 3.5–5.1)
Sodium: 139 mmol/L (ref 135–145)
Total Bilirubin: 0.5 mg/dL (ref 0.3–1.2)
Total Protein: 7.4 g/dL (ref 6.5–8.1)

## 2021-10-15 LAB — CBC WITH DIFFERENTIAL/PLATELET
Abs Immature Granulocytes: 0.02 10*3/uL (ref 0.00–0.07)
Basophils Absolute: 0.1 10*3/uL (ref 0.0–0.1)
Basophils Relative: 1 %
Eosinophils Absolute: 0.1 10*3/uL (ref 0.0–0.5)
Eosinophils Relative: 1 %
HCT: 44.2 % (ref 36.0–46.0)
Hemoglobin: 14.9 g/dL (ref 12.0–15.0)
Immature Granulocytes: 0 %
Lymphocytes Relative: 9 %
Lymphs Abs: 0.8 10*3/uL (ref 0.7–4.0)
MCH: 27.7 pg (ref 26.0–34.0)
MCHC: 33.7 g/dL (ref 30.0–36.0)
MCV: 82.2 fL (ref 80.0–100.0)
Monocytes Absolute: 0.5 10*3/uL (ref 0.1–1.0)
Monocytes Relative: 6 %
Neutro Abs: 7.4 10*3/uL (ref 1.7–7.7)
Neutrophils Relative %: 83 %
Platelets: 181 10*3/uL (ref 150–400)
RBC: 5.38 MIL/uL — ABNORMAL HIGH (ref 3.87–5.11)
RDW: 13.4 % (ref 11.5–15.5)
WBC: 8.8 10*3/uL (ref 4.0–10.5)
nRBC: 0 % (ref 0.0–0.2)

## 2021-10-15 LAB — URINALYSIS, ROUTINE W REFLEX MICROSCOPIC
Bilirubin Urine: NEGATIVE
Glucose, UA: NEGATIVE mg/dL
Hgb urine dipstick: NEGATIVE
Ketones, ur: NEGATIVE mg/dL
Leukocytes,Ua: NEGATIVE
Nitrite: NEGATIVE
Protein, ur: NEGATIVE mg/dL
Specific Gravity, Urine: 1.018 (ref 1.005–1.030)
pH: 7.5 (ref 5.0–8.0)

## 2021-10-15 LAB — CBG MONITORING, ED: Glucose-Capillary: 114 mg/dL — ABNORMAL HIGH (ref 70–99)

## 2021-10-15 LAB — LIPASE, BLOOD: Lipase: 46 U/L (ref 11–51)

## 2021-10-15 LAB — TROPONIN I (HIGH SENSITIVITY): Troponin I (High Sensitivity): 3 ng/L (ref <18)

## 2021-10-15 MED ORDER — ONDANSETRON HCL 4 MG/2ML IJ SOLN
4.0000 mg | Freq: Once | INTRAMUSCULAR | Status: AC
  Administered 2021-10-15: 15:00:00 4 mg via INTRAVENOUS
  Filled 2021-10-15: qty 2

## 2021-10-15 MED ORDER — ONDANSETRON HCL 4 MG PO TABS: 4.0000 mg | ORAL_TABLET | Freq: Four times a day (QID) | ORAL | 0 refills | Status: DC

## 2021-10-15 MED ORDER — DICYCLOMINE HCL 20 MG PO TABS: 20.0000 mg | ORAL_TABLET | Freq: Two times a day (BID) | ORAL | 0 refills | Status: DC | PRN

## 2021-10-15 MED ORDER — ACETAMINOPHEN 325 MG PO TABS
650.0000 mg | ORAL_TABLET | Freq: Once | ORAL | Status: AC
  Administered 2021-10-15: 16:00:00 650 mg via ORAL
  Filled 2021-10-15: qty 2

## 2021-10-15 MED ORDER — ALUM & MAG HYDROXIDE-SIMETH 200-200-20 MG/5ML PO SUSP
30.0000 mL | Freq: Once | ORAL | Status: AC
  Administered 2021-10-15: 17:00:00 30 mL via ORAL
  Filled 2021-10-15: qty 30

## 2021-10-15 MED ORDER — IOHEXOL 300 MG/ML  SOLN
100.0000 mL | Freq: Once | INTRAMUSCULAR | Status: AC | PRN
  Administered 2021-10-15: 18:00:00 100 mL via INTRAVENOUS

## 2021-10-15 MED ORDER — SODIUM CHLORIDE 0.9 % IV BOLUS
1000.0000 mL | Freq: Once | INTRAVENOUS | Status: AC
  Administered 2021-10-15: 15:00:00 1000 mL via INTRAVENOUS

## 2021-10-15 MED ORDER — HYDROMORPHONE HCL 1 MG/ML IJ SOLN
0.5000 mg | Freq: Once | INTRAMUSCULAR | Status: AC
  Administered 2021-10-15: 15:00:00 0.5 mg via INTRAVENOUS
  Filled 2021-10-15: qty 1

## 2021-10-15 MED ORDER — LIDOCAINE VISCOUS HCL 2 % MT SOLN
15.0000 mL | Freq: Once | OROMUCOSAL | Status: AC
  Administered 2021-10-15: 17:00:00 15 mL via ORAL
  Filled 2021-10-15: qty 15

## 2021-10-15 NOTE — Discharge Instructions (Signed)
Lab work and imaging were reassuring.  Possibly of gastritis, recommend bowel rest, recommend liquid diet for today as tolerated moving to a soft diet and then fine to a normal diet.  Also recommend taking Pepcid and this will help with stomach pain.  I given you Bentyl for stomach spasms Zofran for nausea.  Please follow with PCP for further evaluation.  Come back to the emergency department if you develop chest pain, shortness of breath, severe abdominal pain, uncontrolled nausea, vomiting, diarrhea.

## 2021-10-15 NOTE — ED Provider Notes (Signed)
[facility] EMERGENCY DEPT Provider Note   CSN: [id number] Arrival date & time: 10/15/21  1322     History Chief Complaint  Patient presents with   Abdominal Pain    Cassandra Reid is a 65 y.o. female.  HPI  Patient with medical history including anxiety, brain aneurysm, CKD stage III, diabetes, fibromyalgia, hypertension, CVA presents to the emergency department with complaints of stomach pain.  Patient states pain started yesterday, came on suddenly, states pain is in her right upper quadrant in her epigastric region does not radiate, pain is intermittent will come and go no associated vomiting but has episodes of nausea, denies constipation, diarrhea, any urinary symptoms.  Describes the pain as a sharp-like sensation, food intake and/or position does not exacerbate or alleviate the pain, she denies any alleviating factors.  She states that she has had pain like this in the past but generally does not last this long, she has had appendicectomy Coley ectomy, had recent EDG performed which was unremarkable, colonoscopy was performed but unable to review reports.  No other significant abdominal history.  She does not endorse fevers, chills, chest pain, shortness of breath, general body aches.  Has no other complaints at this time.  Past Medical History:  Diagnosis Date   Allergic rhinitis 09/26/2018   Anxiety    Arthritis    Asthma    Brain aneurysm 07/18/2018   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 difficile colitis 07/18/2018   Cerebral aneurysm without rupture    Cerebral aneurysm without rupture    Chest pain 03/26/2020   Chronic kidney disease    CKD stage 3 due to type 2 diabetes mellitus ([place]) 04/15/2019   Depression    Diabetes mellitus without complication (HCC)    Fibromyalgia    Former smoker    GERD (gastroesophageal reflux disease)    Heart murmur    History of chicken pox    History of colon polyps     Hyperlipidemia associated with type 2 diabetes mellitus ([place]) 07/18/2018   Hypertension    Hypertension associated with diabetes ([place]) 07/18/2018   IBS (irritable bowel syndrome)    Ischemic brain injury 05/29/2021   Lacunar stroke ([place]) 05/25/2021   Lacunar stroke, History of ([place]) 05/25/2021   Brain MRI 08/25/21: Few small remote lacunar infarcts noted at the right caudate and left internal capsule   Legally blind    Migraines    Myalgia 04/23/2019   Paroxysmal atrial tachycardia ([place])    On telemetry   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   Type 2 diabetes mellitus with hyperglycemia, with long-term current use of insulin ([place]) 12/03/2019    Patient Active Problem List   Diagnosis Date Noted   Balance problems 09/22/2021   Incontinence 09/22/2021   Healthcare maintenance 07/30/2021   Obesity (BMI 30.0-34.9) 05/25/2021   Anosmia 05/25/2021   Encounter for competency evaluation 05/25/2021   Smoking history 03/29/2021   Osteoporosis 01/18/2021   Foot pain, left 12/09/2020   Encephalomalacia on imaging study 08/25/2020   Fatty liver 05/31/2020   Chronic pain syndrome 04/23/2019   DDD (degenerative disc disease), cervical 04/16/2019   CKD stage 3 due to type 2 diabetes mellitus ([place]) 04/15/2019   Hypertension associated with diabetes ([place]) 07/18/2018   GERD (gastroesophageal reflux disease) 07/18/2018   OSA (obstructive sleep apnea) 07/18/2018   Type  2 diabetes mellitus with diabetic polyneuropathy, with long-term current use of insulin ([place]) 07/18/2018   Fibromyalgia 07/18/2018   Mood disorder ([place]) 07/18/2018   Hyperlipidemia associated with type 2 diabetes mellitus ([place]) 07/18/2018   Legally blind 09/01/2003    Past Surgical History:  Procedure Laterality Date   ABDOMINAL HYSTERECTOMY  2012   APPENDECTOMY  1972   CEREBRAL ANEURYSM REPAIR Left 2004   Aneurysm clip in place near the  level of the left ICA on Brain MRI 08/2020. Also coiling performed   CHOLECYSTECTOMY  2015   CRANIOTOMY Left    left pterional craniotomy   MENISCUS REPAIR Bilateral      OB History   No obstetric history on file.     Family History  Problem Relation Age of Onset   Alcohol abuse Mother    Diabetes Mother    Hypertension Mother    Kidney disease Mother    COPD Father    Diabetes Father    Early death Father    Heart disease Father    Hyperlipidemia Father    Hypertension Father    Diabetes Sister    Hypertension Sister    Breast cancer Sister    Cancer Maternal Grandmother    Alcohol abuse Maternal Grandfather    Cancer Maternal Grandfather    Cancer Paternal Grandmother    Cancer Paternal Grandfather    Diabetes Sister    Hypertension Sister    Kidney disease Sister     Social History   Tobacco Use   Smoking status: Former    Packs/day: 1.00    Years: 3.00    Pack years: 3.00    Types: Cigarettes    Quit date: 06/1967    Years since quitting: 54.3   Smokeless tobacco: Never  Vaping Use   Vaping Use: Never used  Substance Use Topics   Alcohol use: Never   Drug use: Never    Home Medications Prior to Admission medications   Medication Sig Start Date End Date Taking? Authorizing Provider  Ascorbic Acid (VITAMIN C PO) Take 1 tablet by mouth daily. Patient not taking: No sig reported    [provider]  atorvastatin (LIPITOR) 40 MG tablet TAKE 1 TABLET BY MOUTH EVERYDAY AT BEDTIME 08/26/21   Ezequiel Essex, MD  baclofen (LIORESAL) 10 MG tablet TAKE 1 TABLET BY MOUTH TWICE A DAY AS NEEDED FOR MUSCLE SPASM 09/21/21   Ezequiel Essex, MD  BD PEN NEEDLE NANO 2ND GEN 32G X 4 MM MISC USE WITH INSULIN 02/21/21   Ezequiel Essex, MD  dapagliflozin propanediol (FARXIGA) 10 MG TABS tablet Take 1 tablet (10 mg total) by mouth daily before breakfast. This is a patient assistance medication. Patient may not be approved and/or have medication. Please ask and verify  when performing med review. 09/27/21   [name], MD  dicyclomine (BENTYL) 20 MG tablet Take 1 tablet (20 mg total) by mouth 2 (two) times daily as needed for spasms. 10/15/21  Yes [name], PA-C  escitalopram (LEXAPRO) 5 MG tablet TAKE 2 TABLETS BY MOUTH EVERY DAY 08/30/21   Ezequiel Essex, MD  famotidine (PEPCID) 20 MG tablet Take 1 tablet (20 mg total) by mouth 2 (two) times daily. 09/15/21 10/15/21  Ezequiel Essex, MD  fluticasone [facility]) 50 MCG/ACT nasal spray Place 1 spray into both nostrils daily as needed for allergies or rhinitis. 08/09/21   [name], DO  furosemide (LASIX) 40 MG tablet Take 1 tablet (40 mg total) by mouth daily.  Patient not taking: Reported on 09/13/2021 03/08/21   Ezequiel Essex, MD  gabapentin (NEURONTIN) 600 MG tablet Take 0.5-1 tablets (300-600 mg total) by mouth 3 (three) times daily. 06/26/21   Ezequiel Essex, MD  insulin aspart (FIASP FLEXTOUCH) 100 UNIT/ML FlexTouch Pen Inject 20 Units into the skin with breakfast, with lunch, and with evening meal. 06/05/21   Hensel, Jamal Collin, MD  insulin degludec (TRESIBA FLEXTOUCH) 200 UNIT/ML FlexTouch Pen Inject 42 Units into the skin daily. 08/17/21   [name], MD  KLOR-CON M20 20 MEQ tablet TAKE 1 TABLET BY MOUTH TWICE A DAY 07/26/21   Ezequiel Essex, MD  Lancet Devices (ONE TOUCH DELICA LANCING DEV) MISC Please use to check blood sugar up to 4 times daily. E11.42 01/31/21   Ezequiel Essex, MD  Lancets Misc. (ACCU-CHEK SOFTCLIX LANCET DEV) KIT Use as instructed to check blood sugar 4 times daily. Dx B51.02 01/18/21   Ezequiel Essex, MD  lisinopril (ZESTRIL) 5 MG tablet Take 1 tablet (5 mg total) by mouth 2 (two) times daily. 03/28/21   Ezequiel Essex, MD  loratadine (CLARITIN) 10 MG tablet Take 1 tablet (10 mg total) by mouth daily. 08/09/21   Brimage, Ronnette Juniper, DO  LORazepam (ATIVAN) 0.5 MG tablet TAKE 1 TABLET BY MOUTH EVERY DAY AS NEEDED FOR ANXIETY 08/26/21   Ezequiel Essex, MD  magnesium oxide  (MAG-OX) 400 (241.3 Mg) MG tablet Take 1 tablet (400 mg total) by mouth 2 (two) times daily. 03/27/20   Regalado, [name], MD  metFORMIN (GLUCOPHAGE-XR) 750 MG 24 hr tablet Take 1 tablet (750 mg total) by mouth 2 (two) times daily with a meal. 03/08/21   Ezequiel Essex, MD  Multiple Vitamin (MULTIVITAMIN PO) Take 1 tablet by mouth daily.    [provider]  ondansetron (ZOFRAN) 4 MG tablet Take 1 tablet (4 mg total) by mouth every 6 (six) hours. 10/15/21  Yes [name], PA-C  OneTouch Delica Lancets 58N MISC Please use to check blood sugar up to 4 times daily. E11.42 01/31/21   Ezequiel Essex, MD  Semaglutide,0.25 or 0.5MG/DOS, (OZEMPIC, 0.25 OR 0.5 MG/DOSE,) 2 MG/1.5ML SOPN Inject 0.5 mg into the skin once a week. Patient taking differently: Inject 0.25 mg into the skin once a week. 12/09/20   Ezequiel Essex, MD  sucralfate (CARAFATE) 1 g tablet TAKE 1 TABLET (1 G TOTAL) BY MOUTH 4 (FOUR) TIMES DAILY - WITH MEALS AND AT BEDTIME. 10/10/21   Ezequiel Essex, MD  traZODone (DESYREL) 50 MG tablet Take 0.5 tablets (25 mg total) by mouth at bedtime as needed for sleep. 07/11/21 10/09/21  Ezequiel Essex, MD    Allergies    Patient has no known allergies.  Review of Systems   Review of Systems  Constitutional:  Negative for chills and fever.  HENT:  Negative for congestion.   Respiratory:  Negative for shortness of breath.   Cardiovascular:  Negative for chest pain.  Gastrointestinal:  Positive for abdominal pain and nausea. Negative for constipation, diarrhea and vomiting.  Genitourinary:  Negative for enuresis and flank pain.  Musculoskeletal:  Negative for back pain.  Skin:  Negative for rash.  Neurological:  Negative for dizziness.  Hematological:  Does not bruise/bleed easily.   Physical Exam Updated Vital Signs BP (!) 173/81   Pulse 87   Temp 98.6 F (37 C) (Oral)   Resp 18   Ht _0  (1.549 m)   Wt 78 kg   SpO2 98%   BMI 32.50 kg/m  Physical Exam Vitals and  nursing note reviewed.  Constitutional:      General: She is not in acute distress.    Appearance: She is not ill-appearing.  HENT:     Head: Normocephalic and atraumatic.     Nose: No congestion.  Eyes:     Conjunctiva/sclera: Conjunctivae normal.  Cardiovascular:     Rate and Rhythm: Normal rate and regular rhythm.     Pulses: Normal pulses.     Heart sounds: No murmur heard.   No friction rub. No gallop.  Pulmonary:     Effort: No respiratory distress.     Breath sounds: No wheezing, rhonchi or rales.  Abdominal:     Palpations: Abdomen is soft.     Tenderness: There is abdominal tenderness. There is no right CVA tenderness or left CVA tenderness.     Comments: Abdomen nondistended normal bowel sounds, dull to percussion, has noted right upper quadrant, epigastric as well as left lower quadrant tenderness, no there is no guarding, rebound times, peritoneal sign, negative Murphy sign McBurney point no Rovsing sign.  No CVA tenderness.  Musculoskeletal:     Right lower leg: No edema.     Left lower leg: No edema.  Skin:    General: Skin is warm and dry.  Neurological:     Mental Status: She is alert.  Psychiatric:        Mood and Affect: Mood normal.    ED Results / Procedures / Treatments   Labs (all labs ordered are listed, but only abnormal results are displayed) Labs Reviewed  COMPREHENSIVE METABOLIC PANEL - Abnormal; Notable for the following components:      Result Value   Glucose, Bld 168 (*)    Calcium 10.5 (*)    ALT 50 (*)    All other components within normal limits  CBC WITH DIFFERENTIAL/PLATELET - Abnormal; Notable for the following components:   RBC 5.38 (*)    All other components within normal limits  CBG MONITORING, ED - Abnormal; Notable for the following components:   Glucose-Capillary 114 (*)    All other components within normal limits  LIPASE, BLOOD  URINALYSIS, ROUTINE W REFLEX MICROSCOPIC  TROPONIN I (HIGH SENSITIVITY)    EKG EKG  Interpretation  Date/Time:  Sunday October 15 2021 13:50:26 EST Ventricular Rate:  103 PR Interval:  174 QRS Duration: 76 QT Interval:  330 QTC Calculation: 432 R Axis:   74 Text Interpretation: Sinus tachycardia Nonspecific T wave abnormality No significant change since last tracing Confirmed by Blanchie Dessert [phone]) on 10/15/2021 7:30:22 PM  Radiology CT ABDOMEN PELVIS W CONTRAST  Result Date: 10/15/2021 CLINICAL DATA:  Abdominal pain.  Biliary obstruction suspected. EXAM: CT ABDOMEN AND PELVIS WITH CONTRAST TECHNIQUE: Multidetector CT imaging of the abdomen and pelvis was performed using the standard protocol following bolus administration of intravenous contrast. CONTRAST:  116m OMNIPAQUE IOHEXOL 300 MG/ML  SOLN COMPARISON:  CT abdomen and pelvis 05/31/2020. FINDINGS: Lower chest: No acute abnormality. Hepatobiliary: No focal liver abnormality is seen. Status post cholecystectomy. No biliary dilatation. Pancreas: Unremarkable. No pancreatic ductal dilatation or surrounding inflammatory changes. Spleen: Normal in size without focal abnormality. Adrenals/Urinary Tract: There is a rounded hypodensity in the superior pole the left kidney which is too small to characterize, likely a cyst or hemangioma. Otherwise, the kidneys, bladder and adrenal glands are within normal limits. Stomach/Bowel: Stomach is within normal limits. Appendix is not seen. No evidence of bowel wall thickening, distention, or inflammatory changes. Vascular/Lymphatic: No  significant vascular findings are present. No enlarged abdominal or pelvic lymph nodes. Reproductive: Status post hysterectomy. No adnexal masses. Other: Small fat containing umbilical hernia is unchanged. No abdominopelvic ascites. Musculoskeletal: No acute or significant osseous findings. IMPRESSION: No acute localizing process in the abdomen or pelvis. Electronically Signed   By: Ronney Asters M.D.   On: 10/15/2021 18:22   DG Chest Port 1 View  Result  Date: 10/15/2021 CLINICAL DATA:  Right lower rib pain. EXAM: PORTABLE CHEST 1 VIEW COMPARISON:  Chest x-ray 03/30/2021 FINDINGS: The heart size and mediastinal contours are within normal limits. Both lungs are clear. The visualized skeletal structures are unremarkable. IMPRESSION: No active disease. Electronically Signed   By: Ronney Asters M.D.   On: 10/15/2021 16:09    Procedures Procedures   Medications Ordered in ED Medications  sodium chloride 0.9 % bolus 1,000 mL (0 mLs Intravenous Stopped 10/15/21 1612)  HYDROmorphone (DILAUDID) injection 0.5 mg (0.5 mg Intravenous Given 10/15/21 1505)  ondansetron (ZOFRAN) injection 4 mg (4 mg Intravenous Given 10/15/21 1506)  acetaminophen (TYLENOL) tablet 650 mg (650 mg Oral Given 10/15/21 1611)  alum & mag hydroxide-simeth (MAALOX/MYLANTA) 200-200-20 MG/5ML suspension 30 mL (30 mLs Oral Given 10/15/21 1640)    And  lidocaine (XYLOCAINE) 2 % viscous mouth solution 15 mL (15 mLs Oral Given 10/15/21 1640)  iohexol (OMNIPAQUE) 300 MG/ML solution 100 mL (100 mLs Intravenous Contrast Given 10/15/21 1805)    ED Course  I have reviewed the triage vital signs and the nursing notes.  Pertinent labs & imaging results that were available during my care of the patient were reviewed by me and considered in my medical decision making (see chart for details).    MDM Rules/Calculators/A&P                          Initial impression-abdominal tenderness, alert, no acute distress, vital signs over tachycardia.  Unclear etiology will obtain basic lab work-up, provide her with fluids, antiemetics, pain medications and reassess.  We will also obtain chest x-ray to rule out lower lobe pneumonia.  Work-up-CBC unremarkable, CMP shows slight hyperglycemia 160, calcium 10.5, liver enzymes 50, lipase 46, for troponin is 3, UA negative for signs of infection.  EKG sinus tach without signs of ischemia.  Chest x-ray unremarkable, CT abdomen pelvis negative for acute  findings.  Reassessment-patient is reassessed after fluids, pain medications, states she is feeling slightly better but still has tenderness on my exam.  In her right upper quadrant we will provide with a GI cocktail and reassess.  Reassessment GI cocktail cells having slight discomfort will obtain CT imaging for rule out of possible intra-abdominal infection.  Updated lab work and imaging, she has no complaints this time, tolerating p.o., she is agreeable for discharge.  Rule out-low suspicion for lower lobe pneumonia as lung sounds were clear bilaterally, chest x-ray unremarkable.  Low suspicion for pancreatitis as lipase is within normal limits.  Low suspicion for liver or gallbladder abnormality as she has no elevation liver enzymes, alk phos, T bili, CT imaging negative for acute findings.  Low suspicion for bowel obstruction as abdomen is nondistended, normal bowel sounds, still passing gas and having normal bowel movements.  Low suspicion for intra-abdominal infection, complicated diverticulitis, [place], kidney stone, upper stomach ulcer CT imaging is all negative for acute findings.  Low suspicion for dissection of the aortic aneurysm as presentation is atypical etiology, no acute findings seen on CT imaging.  No suspicion  for atypical ACS as EKG is without signs of ischemia, for troponin is 3, will defer second troponin as she has had no chest pain in the last 12 hours atypical for ACS at this time.  Plan-  Right upper quadrant pain resolved-unclear etiology, possible gastritis, will provide her with nausea medication, pain medication, recommend a bland diet follow-up with PCP for further evaluation.  Gave strict return precautions.  Vital signs have remained stable, no indication for hospital admission.  Patient given at home care as well strict return precautions.  Patient verbalized that they understood agreed to said plan.  Final Clinical Impression(s) / ED Diagnoses Final diagnoses:   Right upper quadrant abdominal pain    Rx / DC Orders ED Discharge Orders          Ordered    dicyclomine (BENTYL) 20 MG tablet  2 times daily PRN        10/15/21 1930    ondansetron (ZOFRAN) 4 MG tablet  Every 6 hours        10/15/21 1930             [name], PA-C 10/15/21 [place], [place], DO 10/18/21 [phone]

## 2021-10-15 NOTE — ED Notes (Signed)
First contact with patient. Pt arrived via triage from home with c/o upper abdominal pain Pt. denies shob, is A&OX 4, resp. even/unlabored. Pt placed into gown and on cardiac monitor, call light within reach. Patient updated on plan of care. Will continue to monitor patient.

## 2021-10-15 NOTE — ED Triage Notes (Signed)
Pt c/o upper abdominal pain onset last night. Pt reports nausea as well.

## 2021-10-15 NOTE — ED Notes (Signed)
ED Provider at bedside. 

## 2021-10-17 NOTE — Patient Instructions (Incomplete)
Thank you for coming to see me today. It was a pleasure.  ° °*** ° °Please follow-up with *** in *** ° °If you have any questions or concerns, please do not hesitate to call the office at [phone]. ° °Best,  ° °Lawayne Hartig, MD   °

## 2021-10-17 NOTE — Progress Notes (Deleted)
    SUBJECTIVE:   CHIEF COMPLAINT / HPI:   Patient seen in ED on 11/27 and treated for RUQ pain secondary to possible gastritis. Work up unrevealing except for mild hyperglycemia 160.  CT abd negative for infectious process. Presents today for follow up **. Reports improvement in ** Requires repeat ** BMP/ renal function panel/CBC today.  PERTINENT  PMH / PSH: ***  OBJECTIVE:   There were no vitals taken for this visit.   General: Alert, no acute distress Cardio: Normal S1 and S2, RRR, no r/m/g Pulm: CTAB, normal work of breathing Abdomen: Bowel sounds normal. Abdomen soft and non-tender.  Extremities: No peripheral edema.  Neuro: Cranial nerves grossly intact   ASSESSMENT/PLAN:   No problem-specific Assessment & Plan notes found for this encounter.     Dana Allan, MD [facility]

## 2021-10-18 ENCOUNTER — Encounter: Payer: Self-pay | Admitting: Pharmacist

## 2021-10-18 ENCOUNTER — Ambulatory Visit: Payer: Medicare (Managed Care) | Admitting: Pharmacist

## 2021-10-18 ENCOUNTER — Ambulatory Visit: Payer: Medicare (Managed Care) | Admitting: Family Medicine

## 2021-10-19 NOTE — Progress Notes (Signed)
Refills for this medication can be e-scribed to [facility] pharmacy listed on pt's account

## 2021-10-24 ENCOUNTER — Other Ambulatory Visit: Payer: Self-pay

## 2021-10-27 ENCOUNTER — Other Ambulatory Visit: Payer: Self-pay | Admitting: Family Medicine

## 2021-10-27 ENCOUNTER — Encounter: Payer: Self-pay | Admitting: Pharmacist

## 2021-10-28 ENCOUNTER — Other Ambulatory Visit: Payer: Self-pay | Admitting: Family Medicine

## 2021-10-28 DIAGNOSIS — G894 Chronic pain syndrome: Secondary | ICD-10-CM

## 2021-10-28 DIAGNOSIS — M797 Fibromyalgia: Secondary | ICD-10-CM

## 2021-10-31 ENCOUNTER — Other Ambulatory Visit: Payer: Self-pay

## 2021-10-31 ENCOUNTER — Emergency Department (HOSPITAL_BASED_OUTPATIENT_CLINIC_OR_DEPARTMENT_OTHER)
Admission: EM | Admit: 2021-10-31 | Discharge: 2021-10-31 | Disposition: A | Discharge status: Home / Self Care | Payer: Medicare (Managed Care) | Attending: Emergency Medicine | Admitting: Emergency Medicine

## 2021-10-31 ENCOUNTER — Encounter (HOSPITAL_BASED_OUTPATIENT_CLINIC_OR_DEPARTMENT_OTHER): Payer: Self-pay | Admitting: Emergency Medicine

## 2021-10-31 DIAGNOSIS — Z794 Long term (current) use of insulin: Secondary | ICD-10-CM | POA: Insufficient documentation

## 2021-10-31 DIAGNOSIS — T2101XA Burn of unspecified degree of chest wall, initial encounter: Secondary | ICD-10-CM | POA: Diagnosis present

## 2021-10-31 DIAGNOSIS — X101XXA Contact with hot food, initial encounter: Secondary | ICD-10-CM | POA: Insufficient documentation

## 2021-10-31 DIAGNOSIS — Z7984 Long term (current) use of oral hypoglycemic drugs: Secondary | ICD-10-CM | POA: Diagnosis not present

## 2021-10-31 DIAGNOSIS — J45909 Unspecified asthma, uncomplicated: Secondary | ICD-10-CM | POA: Insufficient documentation

## 2021-10-31 DIAGNOSIS — Z79899 Other long term (current) drug therapy: Secondary | ICD-10-CM | POA: Insufficient documentation

## 2021-10-31 DIAGNOSIS — E1122 Type 2 diabetes mellitus with diabetic chronic kidney disease: Secondary | ICD-10-CM | POA: Diagnosis not present

## 2021-10-31 DIAGNOSIS — Z87891 Personal history of nicotine dependence: Secondary | ICD-10-CM | POA: Diagnosis not present

## 2021-10-31 DIAGNOSIS — N183 Chronic kidney disease, stage 3 unspecified: Secondary | ICD-10-CM | POA: Insufficient documentation

## 2021-10-31 DIAGNOSIS — T2111XA Burn of first degree of chest wall, initial encounter: Secondary | ICD-10-CM | POA: Diagnosis not present

## 2021-10-31 DIAGNOSIS — I129 Hypertensive chronic kidney disease with stage 1 through stage 4 chronic kidney disease, or unspecified chronic kidney disease: Secondary | ICD-10-CM | POA: Insufficient documentation

## 2021-10-31 NOTE — ED Provider Notes (Signed)
[facility]ERG[facility] DEPT Provider Note   CSN: [id number] Arrival date & time: 10/31/21  1816     History Chief Complaint  Patient presents with   Burn    LEXIS POTENZA is a 65 y.o. female.  Patient with history of diabetes, CKD, and hypertension presents today with chief complaint of burn.  She states around 5 PM today she was heating up broth in the microwave, she felt she needed it too much and accidentally spilled it on her chest.  Pain noted to the chest area with some blistering.  She tried placing a cold cloth on the area without relief of pain.  She denies fevers, chills, shortness of breath, nausea, vomiting  The history is provided by the patient. No language interpreter was used.  Burn Burn location: chest. Burn quality:  Intact blister, red and painful Time since incident:  2 hours     Past Medical History:  Diagnosis Date   Allergic rhinitis 09/26/2018   Anxiety    Arthritis    Asthma    Brain aneurysm 07/18/2018   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 difficile colitis 07/18/2018   Cerebral aneurysm without rupture    Cerebral aneurysm without rupture    Chest pain 03/26/2020   Chronic kidney disease    CKD stage 3 due to type 2 diabetes mellitus ([place]) 04/15/2019   Depression    Diabetes mellitus without complication (HCC)    Fibromyalgia    Former smoker    GERD (gastroesophageal reflux disease)    Heart murmur    History of chicken pox    History of colon polyps    Hyperlipidemia associated with type 2 diabetes mellitus ([place]) 07/18/2018   Hypertension    Hypertension associated with diabetes ([place]) 07/18/2018   IBS (irritable bowel syndrome)    Ischemic brain injury 05/29/2021   Lacunar stroke ([place]) 05/25/2021   Lacunar stroke, History of ([place]) 05/25/2021   Brain MRI 08/25/21: Few small remote lacunar infarcts noted at the right caudate and left internal capsule   Legally blind     Migraines    Myalgia 04/23/2019   Paroxysmal atrial tachycardia ([place])    On telemetry   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   Type 2 diabetes mellitus with hyperglycemia, with long-term current use of insulin ([place]) 12/03/2019    Patient Active Problem List   Diagnosis Date Noted   Balance problems 09/22/2021   Incontinence 09/22/2021   Healthcare maintenance 07/30/2021   Obesity (BMI 30.0-34.9) 05/25/2021   Anosmia 05/25/2021   Encounter for competency evaluation 05/25/2021   Smoking history 03/29/2021   Osteoporosis 01/18/2021   Foot pain, left 12/09/2020   Encephalomalacia on imaging study 08/25/2020   Fatty liver 05/31/2020   Chronic pain syndrome 04/23/2019   DDD (degenerative disc disease), cervical 04/16/2019   CKD stage 3 due to type 2 diabetes mellitus ([place]) 04/15/2019   Hypertension associated with diabetes ([place]) 07/18/2018   GERD (gastroesophageal reflux disease) 07/18/2018   OSA (obstructive sleep apnea) 07/18/2018   Type 2 diabetes mellitus with diabetic polyneuropathy, with long-term current use of insulin ([place]) 07/18/2018   Fibromyalgia 07/18/2018   Mood disorder ([place]) 07/18/2018   Hyperlipidemia associated with type 2 diabetes mellitus ([place]) 07/18/2018   Legally blind 09/01/2003    Past Surgical History:  Procedure Laterality Date   ABDOMINAL HYSTERECTOMY  2012   APPENDECTOMY  1972   CEREBRAL ANEURYSM REPAIR Left 2004   Aneurysm clip in place near the level of the left ICA on Brain MRI 08/2020. Also coiling performed   CHOLECYSTECTOMY  2015   CRANIOTOMY Left    left pterional craniotomy   MENISCUS REPAIR Bilateral      OB History   No obstetric history on file.     Family History  Problem Relation Age of Onset   Alcohol abuse Mother    Diabetes Mother    Hypertension Mother    Kidney disease Mother    COPD Father    Diabetes Father    Early death Father     Heart disease Father    Hyperlipidemia Father    Hypertension Father    Diabetes Sister    Hypertension Sister    Breast cancer Sister    Cancer Maternal Grandmother    Alcohol abuse Maternal Grandfather    Cancer Maternal Grandfather    Cancer Paternal Grandmother    Cancer Paternal Grandfather    Diabetes Sister    Hypertension Sister    Kidney disease Sister     Social History   Tobacco Use   Smoking status: Former    Packs/day: 1.00    Years: 3.00    Pack years: 3.00    Types: Cigarettes    Quit date: 06/1967    Years since quitting: 54.4   Smokeless tobacco: Never  Vaping Use   Vaping Use: Never used  Substance Use Topics   Alcohol use: Never   Drug use: Never    Home Medications Prior to Admission medications   Medication Sig Start Date End Date Taking? Authorizing Provider  Ascorbic Acid (VITAMIN C PO) Take 1 tablet by mouth daily. Patient not taking: No sig reported    [provider]  atorvastatin (LIPITOR) 40 MG tablet TAKE 1 TABLET BY MOUTH EVERYDAY AT BEDTIME 08/26/21   Ezequiel Essex, MD  baclofen (LIORESAL) 10 MG tablet TAKE 1 TABLET BY MOUTH TWICE A DAY AS NEEDED FOR MUSCLE SPASMS 10/16/21   Ezequiel Essex, MD  BD PEN NEEDLE NANO 2ND GEN 32G X 4 MM MISC USE WITH INSULIN 02/21/21   Ezequiel Essex, MD  dapagliflozin propanediol (FARXIGA) 10 MG TABS tablet Take 1 tablet (10 mg total) by mouth daily before breakfast. This is a patient assistance medication. Patient may not be approved and/or have medication. Please ask and verify when performing med review. 09/27/21   [name], MD  dicyclomine (BENTYL) 20 MG tablet Take 1 tablet (20 mg total) by mouth 2 (two) times daily as needed for spasms. 10/15/21   [name], PA-C  escitalopram (LEXAPRO) 5 MG tablet TAKE 2 TABLETS BY MOUTH EVERY DAY 10/27/21   Ezequiel Essex, MD  famotidine (PEPCID) 20 MG tablet Take 1 tablet (20 mg total) by mouth 2 (two) times daily. 09/15/21 10/15/21  Ezequiel Essex, MD  fluticasone [facility]) 50 MCG/ACT nasal spray Place 1 spray into both nostrils daily as needed for allergies or rhinitis. 08/09/21   [name], DO  furosemide (LASIX) 40 MG tablet Take 1 tablet (40 mg total) by mouth daily. Patient not taking: Reported on 09/13/2021 03/08/21   Ezequiel Essex, MD  gabapentin (NEURONTIN) 600 MG tablet TAKE 0.5-1 TABLET (300-600 MG TOTAL) BY MOUTH 3 (THREE) TIMES DAILY. 10/30/21   Ganta, Anupa, DO  insulin aspart (FIASP FLEXTOUCH) 100 UNIT/ML FlexTouch Pen Inject 20 Units into the skin with breakfast, with lunch,  and with evening meal. 06/05/21   Hensel, Jamal Collin, MD  insulin degludec (TRESIBA FLEXTOUCH) 200 UNIT/ML FlexTouch Pen Inject 42 Units into the skin daily. 08/17/21   [name], MD  KLOR-CON M20 20 MEQ tablet TAKE 1 TABLET BY MOUTH TWICE A DAY 07/26/21   Ezequiel Essex, MD  Lancet Devices (ONE TOUCH DELICA LANCING DEV) MISC Please use to check blood sugar up to 4 times daily. E11.42 01/31/21   Ezequiel Essex, MD  Lancets Misc. (ACCU-CHEK SOFTCLIX LANCET DEV) KIT Use as instructed to check blood sugar 4 times daily. Dx E52.77 01/18/21   Ezequiel Essex, MD  lisinopril (ZESTRIL) 5 MG tablet Take 1 tablet (5 mg total) by mouth 2 (two) times daily. 03/28/21   Ezequiel Essex, MD  loratadine (CLARITIN) 10 MG tablet Take 1 tablet (10 mg total) by mouth daily. 08/09/21   Brimage, Ronnette Juniper, DO  LORazepam (ATIVAN) 0.5 MG tablet TAKE 1 TABLET BY MOUTH EVERY DAY AS NEEDED FOR ANXIETY 08/26/21   Ezequiel Essex, MD  magnesium oxide (MAG-OX) 400 (241.3 Mg) MG tablet Take 1 tablet (400 mg total) by mouth 2 (two) times daily. 03/27/20   Regalado, [name], MD  metFORMIN (GLUCOPHAGE-XR) 750 MG 24 hr tablet Take 1 tablet (750 mg total) by mouth 2 (two) times daily with a meal. 03/08/21   Ezequiel Essex, MD  Multiple Vitamin (MULTIVITAMIN PO) Take 1 tablet by mouth daily.    [provider]  ondansetron (ZOFRAN) 4 MG tablet Take 1 tablet (4 mg total) by  mouth every 6 (six) hours. 10/15/21   [name], PA-C  OneTouch Delica Lancets 82U MISC Please use to check blood sugar up to 4 times daily. E11.42 01/31/21   Ezequiel Essex, MD  Semaglutide,0.25 or 0.5MG /DOS, (OZEMPIC, 0.25 OR 0.5 MG/DOSE,) 2 MG/1.5ML SOPN Inject 0.5 mg into the skin once a week. Patient taking differently: Inject 0.25 mg into the skin once a week. 12/09/20   Ezequiel Essex, MD  sucralfate (CARAFATE) 1 g tablet TAKE 1 TABLET (1 G TOTAL) BY MOUTH 4 (FOUR) TIMES DAILY - WITH MEALS AND AT BEDTIME. 10/10/21   Ezequiel Essex, MD  traZODone (DESYREL) 50 MG tablet Take 0.5 tablets (25 mg total) by mouth at bedtime as needed for sleep. 07/11/21 10/09/21  Ezequiel Essex, MD    Allergies    Patient has no known allergies.  Review of Systems   Review of Systems  Constitutional:  Negative for chills and fever.  Gastrointestinal:  Negative for abdominal pain, diarrhea, nausea and vomiting.  Skin:  Positive for wound.  All other systems reviewed and are negative.  Physical Exam Updated Vital Signs BP (!) 181/94 (BP Location: Left Arm)    Pulse 94    Temp 97.8 F (36.6 C) (Oral)    Resp 18    Ht 5\' 1"  (1.549 m)    Wt 78.9 kg    SpO2 100%    BMI 32.88 kg/m   Physical Exam Vitals and nursing note reviewed.  Constitutional:      Appearance: Normal appearance. She is obese.  HENT:     Head: Normocephalic and atraumatic.  Cardiovascular:     Rate and Rhythm: Normal rate and regular rhythm.     Heart sounds: Normal heart sounds.  Pulmonary:     Effort: Pulmonary effort is normal. No respiratory distress.  Abdominal:     General: Abdomen is flat.     Palpations: Abdomen is soft.  Musculoskeletal:  General: Normal range of motion.     Comments: 6 cm superficial burn noted to the anterior chest wall superior to the left breast. 2 1 cm x 1 cm intact blisters noted. No neck or face involvement. No contracture or skin tightening.  Skin:    General: Skin is warm and  dry.  Neurological:     General: No focal deficit present.     Mental Status: She is alert.  Psychiatric:        Mood and Affect: Mood normal.        Behavior: Behavior normal.    ED Results / Procedures / Treatments   Labs (all labs ordered are listed, but only abnormal results are displayed) Labs Reviewed - No data to display  EKG None  Radiology No results found.  Procedures .Burn Treatment  Date/Time: 10/31/2021 7:51 PM Performed by: Bud Face, PA-C Authorized by: Bud Face, PA-C   Consent:    Consent obtained:  Verbal   Consent given by:  Patient   Risks, benefits, and alternatives were discussed: yes     Risks discussed:  Pain   Alternatives discussed:  No treatment and delayed treatment Universal protocol:    Procedure explained and questions answered to patient or proxy's satisfaction: yes     Required blood products, implants, devices, and special equipment available: yes     Patient identity confirmed:  Verbally with patient Sedation:    Sedation type:  None Procedure details:    Total body burn percentage - superficial:  3   Escharotomy performed: no   Burn area 1 details:    Burn depth:  Superficial (1st)   Affected area:  Torso   Torso location:  Chest   Debridement performed: no     Wound treatment:  Bacitracin   Dressing:  Non-stick sterile dressing and Xeroform gauze Post-procedure details:    Procedure completion:  Tolerated well, no immediate complications   Medications Ordered in ED Medications - No data to display  ED Course  I have reviewed the triage vital signs and the nursing notes.  Pertinent labs & imaging results that were available during my care of the patient were reviewed by me and considered in my medical decision making (see chart for details).    MDM Rules/Calculators/A&P                         Patient presents today 2 hours following burn to superior chest.  Superficial burn noted with 2 intact blisters, no  further debridement warranted at this time.  Wound dressed with bacitracin and Xeroform as above, patient tolerated well.  No other concerns, patient given additional bacitracin for at home wound care and given burn center follow-up as needed for further evaluation and management.  Educated on red flag symptoms such as infection that would prompt immediate return.  Discharged in stable condition.  Findings and plan of care discussed with supervising physician Dr. Sherry Ruffing who is in agreement.    Final Clinical Impression(s) / ED Diagnoses Final diagnoses:  Superficial burn of chest wall excluding breast and nipple    Rx / DC Orders ED Discharge Orders     None     An After Visit Summary was printed and given to the patient.    Nestor Lewandowsky 10/31/21 1958    Tegeler, Gwenyth Allegra, MD 11/01/21 [phone]

## 2021-10-31 NOTE — ED Triage Notes (Signed)
Pt arrives to ED with c/o burn to chest. Pt reports that she spilled boiling hot soup on chest.

## 2021-10-31 NOTE — Discharge Instructions (Addendum)
You were seen today for evaluation of your burn.  Wound care performed.  Please leave dressing on for 24 hours and then change with bacitracin you were given.  Monitor for signs of infection including fevers, chills, yellow discharge, or worsening pain or redness.  If you continue to have difficulties with this you may call the burn center at [facility] to make an appointment.  Their number is [phone].  Return if development of any new or worsening symptoms.  Otherwise, follow-up with your primary care for further evaluation as needed.

## 2021-11-01 ENCOUNTER — Telehealth (INDEPENDENT_AMBULATORY_CARE_PROVIDER_SITE_OTHER): Payer: Medicare (Managed Care) | Admitting: Pharmacist

## 2021-11-01 DIAGNOSIS — Z794 Long term (current) use of insulin: Secondary | ICD-10-CM | POA: Diagnosis not present

## 2021-11-01 DIAGNOSIS — E1142 Type 2 diabetes mellitus with diabetic polyneuropathy: Secondary | ICD-10-CM

## 2021-11-01 DIAGNOSIS — Z713 Dietary counseling and surveillance: Secondary | ICD-10-CM | POA: Diagnosis not present

## 2021-11-01 MED ORDER — INSULIN ASPART (W/NIACINAMIDE) 100 UNIT/ML ~~LOC~~ SOPN
15.0000 [IU] | PEN_INJECTOR | Freq: Three times a day (TID) | SUBCUTANEOUS | 0 refills | Status: DC
Start: 2021-11-01 — End: 2022-09-22

## 2021-11-01 MED ORDER — INSULIN DEGLUDEC 100 UNIT/ML ~~LOC~~ SOPN: 44.0000 [IU] | PEN_INJECTOR | Freq: Every day | SUBCUTANEOUS | 0 refills | Status: DC

## 2021-11-01 NOTE — Progress Notes (Signed)
Subjective:    Patient ID: Cassandra Reid, female    DOB: [birth year], 65 y.o.   MRN: [id number]  HPI Patient is a 65 y.o. female who presents for diabetes management and Dexcom follow-up. She is in good spirits and presents via video visit. Patient referred on 05/18/21 and last seen by Primary Care Provider on 06/05/21. Last seen in pharmacy clinic on 08/15/21. Last seen in pharmacy clinic on 09/27/21.  I connected with  [name] on 11/01/21 by a video enabled telemedicine application and verified that I am speaking with the correct person using two identifiers.   I discussed the limitations of evaluation and management by telemedicine. The patient expressed understanding and agreed to proceed. Patient located at home provider located in office.  Patient reports over the past couple weeks she was not feeling the best so her blood glucose was "up and down." She states she is starting to feel better today and is getting back to her regular diet.  Insurance coverage/medication affordability: [employer], Patient Assistance Program  Current diabetes medications include: semaglutide (Ozempic) 0.5 mg once weekly on Thursdays, insulin degludec Evaristo Bury) 44 units once daily, insulin aspart (Fiasp) 15 units TID before meals, metformin XR 750 BID,   Patient states that She is taking her medications as prescribed. Patient reports adherence with medications.  Do you feel that your medications are working for you?  yes  Have you been experiencing any side effects to the medications prescribed? no  Do you have any problems obtaining medications due to transportation or finances?  Yes; trouble getting Marcelline Deist but planning to contact company today   Patient reported dietary habits:  Eats 2-3 meals/day  Breakfast - toast or oatmeal w/ eggs Lunch - pasta or chicken w/ potato and veggie Dinner- sandwich or cheese and crackers w/ hard boiled egg   Patient denies hypoglycemic events which she  states occur later in evening before bedtime Patient denies increased thirst Patient denies increased urination Patient denies increased hunger. Patient reports neuropathy (nerve pain). Patient reports visual changes. Patient reports self foot exams.   Dexcom G6 patient education Patient taking >1 gram acetaminophen every 6 hours: denies Patient taking hydroxyrea: denies.  Sensor application --  Site selection and site prep with alcohol pad Sensor prep-sensor pack and sensor applicator Sensor applied to area away from waistband, scarring, tattoos, irritation, and bones Transmitter sanitized with alcohol pad and inserted into sensor. Starting the sensor: 2 hour warm up before BG readings available Sensor change every 10 days and rotate site Call Dexcom customer service if sensor comes off before 10 days  Safety and Troubleshooting Do a fingerstick blood glucose test if the sensor readings do not match how you feel Remove sensor prior to magnetic resonance imaging (MRI), computed tomography (CT) scan, or high-frequency electrical heat (diathermy) treatment. Do not allow sun screen or insect repellant to come into contact with Dexcom G6. These skin care products may lead for the plastic used in the Dexcom G6 to crack. Dexcom G6 may be worn through a Industrial/product designer. It may not be exposed to an advanced Imaging Technology (AIT) body scanner (also called a millimeter wave scanner) or the baggage x-ray machine. Instead, ask for hand-wanding or full-body pat-down and visual inspection.  Doses of acetaminophen (Tylenol) >1 gram every 6 hours may cause false high readings. Hydroxyurea (Hydrea, Droxia) may interfere with accuracy of blood glucose readings from Dexcom G6. Store sensor kit between 36 and 86 degrees Farenheit. Can be  refrigerated within this temperature range.  Contact information provided for [facility] customer service and/or trainer.   Objective:     Labs:    Physical Exam Neurological:     Mental Status: She is alert and oriented to person, place, and time.    Review of Systems  Gastrointestinal:  Negative for nausea and vomiting.   Lab Results  Component Value Date   HGBA1C 8.9 (A) 07/28/2021   HGBA1C 7.9 (A) 05/01/2021   HGBA1C 9.9 (H) 12/09/2020    Lab Results  Component Value Date   MICRALBCREAT 30-300 11/10/2020    Lipid Panel     Component Value Date/Time   CHOL 120 07/31/2019 1339   TRIG 242 (H) 07/31/2019 1339   HDL 42 (L) 07/31/2019 1339   CHOLHDL 2.9 07/31/2019 1339   VLDL 38.2 07/18/2018 0940   LDLCALC 48 07/31/2019 1339    Assessment/Plan:   T2DM is not controlled but slowly improving likely due to patient assistance with medications eliminating financial burden for patient. Medication adherence appears optimal. Will increase Ozempic to $RemoveBe'1mg'tXhVNTHwX$  once weekly with plan to continue to titrate up to max dose of $Remov'2mg'RVEJMM$  once weekly. Patient has yet to receive [place] shipment and finished her course of Jardiance samples provided to her several weeks ago. She is planning to contact company today to discuss. Following instruction patient verbalized understanding of treatment plan.    Increased dose of GLP1 Ozempic to $RemoveBe'1mg'ZknOhSNjT$  once weekly. Continue Tresiba to 44 units once daily, provided patient with sample. Continue insulin aspart (Fiasp) to 15 units with all meals (TID) Will initiate Farxiga $RemoveBeforeDE'10mg'HJmupkTamjTXdbw$  upon receipt Extensively discussed pathophysiology of diabetes, dietary effects on blood sugar control, and recommended lifestyle interventions. Counseled on s/sx of and management of hypoglycemia Counseled on checking blood sugar with a finger stick (instead of sensor) when low after taking a glucose tablet and waiting 15 minutes Next A1C anticipated at next PCP appt.  Follow-up appointment 5 weeks to review sugar readings. Written patient instructions provided.  This appointment required 22 minutes of direct patient care.  Thank  you for involving pharmacy to assist in providing this patient's care.

## 2021-11-01 NOTE — Assessment & Plan Note (Signed)
T2DM is not controlled but slowly improving likely due to patient assistance with medications eliminating financial burden for patient. Medication adherence appears optimal. Will increase Ozempic to 1mg  once weekly with plan to continue to titrate up to max dose of 2mg  once weekly. Patient has yet to receive shipment and finished her course of Jardiance samples provided to her several weeks ago. She is planning to contact company today to discuss. Following instruction patient verbalized understanding of treatment plan.    1. Increased dose of GLP1 Ozempic to 1mg  once weekly. 2. Continue Tresiba to 44 units once daily, provided patient with sample. 3. Continue insulin aspart (Fiasp) to 15 units with all meals (TID) 4. Will initiate Farxiga 10mg  upon receipt 5. Extensively discussed pathophysiology of diabetes, dietary effects on blood sugar control, and recommended lifestyle interventions. 6. Counseled on s/sx of and management of hypoglycemia 7. Counseled on checking blood sugar with a finger stick (instead of sensor) when low after taking a glucose tablet and waiting 15 minutes 8. Next A1C anticipated at next PCP appt.  Follow-up appointment 5 weeks to review sugar readings.

## 2021-11-06 NOTE — Patient Instructions (Addendum)
It was wonderful to see you today. Thank you for allowing me to be a part of your care. Below is a short summary of what we discussed at your visit today:  Depression Keep your neuropsych appointment next week.  We will make medicine adjustments based on their recommendations.  Diabetes Need to check your A1c again.  You may choose to come in and do a lab only appointment at your convenience, or you may simply wait until your appointment with Cyndi Bender scheduled for 12/06/2021.  Fibromyalgia I have resent a referral to physical therapy.  Hopefully your co-pay will much more manageable after January 1 with your new insurance.  I have also sent a referral to the pain management clinic, please let us know if you do not hear from them in a week or 2 to schedule.  Obstructive sleep apnea I have resent a referral for sleep studies.  Please let us know if they have not called you within about a week.  We need to get you a new sleep apnea machine and equipment.  Cooking and Nutrition Classes [employer] in [place] provides many classes at low or no cost to [employer], nutrition, and agriculture.  Their website offers a huge variety of information related to topics such as gardening, nutrition, cooking, parenting, and health.  Also listed are classes and events, both online and in-person.  Check out their website here: [email or web address]     Please bring all of your medications to every appointment!  If you have any questions or concerns, please do not hesitate to contact us via phone or MyChart message.   Fayette Pho, MD

## 2021-11-08 ENCOUNTER — Other Ambulatory Visit: Payer: Self-pay

## 2021-11-08 ENCOUNTER — Telehealth (INDEPENDENT_AMBULATORY_CARE_PROVIDER_SITE_OTHER): Payer: Medicare (Managed Care) | Admitting: Family Medicine

## 2021-11-08 ENCOUNTER — Telehealth: Payer: Self-pay

## 2021-11-08 DIAGNOSIS — E785 Hyperlipidemia, unspecified: Secondary | ICD-10-CM

## 2021-11-08 DIAGNOSIS — E1142 Type 2 diabetes mellitus with diabetic polyneuropathy: Secondary | ICD-10-CM

## 2021-11-08 DIAGNOSIS — Z794 Long term (current) use of insulin: Secondary | ICD-10-CM

## 2021-11-08 DIAGNOSIS — R110 Nausea: Secondary | ICD-10-CM

## 2021-11-08 DIAGNOSIS — H548 Legal blindness, as defined in USA: Secondary | ICD-10-CM

## 2021-11-08 DIAGNOSIS — M797 Fibromyalgia: Secondary | ICD-10-CM | POA: Diagnosis not present

## 2021-11-08 DIAGNOSIS — F39 Unspecified mood [affective] disorder: Secondary | ICD-10-CM | POA: Diagnosis not present

## 2021-11-08 DIAGNOSIS — G894 Chronic pain syndrome: Secondary | ICD-10-CM

## 2021-11-08 DIAGNOSIS — G4733 Obstructive sleep apnea (adult) (pediatric): Secondary | ICD-10-CM

## 2021-11-08 DIAGNOSIS — K760 Fatty (change of) liver, not elsewhere classified: Secondary | ICD-10-CM

## 2021-11-08 DIAGNOSIS — R2689 Other abnormalities of gait and mobility: Secondary | ICD-10-CM | POA: Diagnosis not present

## 2021-11-08 DIAGNOSIS — E1169 Type 2 diabetes mellitus with other specified complication: Secondary | ICD-10-CM

## 2021-11-08 NOTE — Progress Notes (Signed)
[facility] Telemedicine Visit  Patient consented to have virtual visit and was identified by name and date of birth. Method of visit: Video was attempted but was interrupted: >50% of visit completed via video, the last 10 minutes via phone  Encounter participants: Patient: Cassandra Reid - located at home Provider: Fayette Pho - located at [facility]  Others (if applicable): n/a  Chief Complaint: Follow up on T2DM, falls, mood disorder, fibromyalgia  HPI:  Diabetes Doing well, tolerating meds well Plan for A1c at lab visit Sick to stomach still (chronic), but no worse after starting ozempic Next appointment with [name] 12/06/21  Falls Appt with neuro-psych and vestibular rehab in next couple weeks Not as dizzy so much Feels weak, little off balance No recurrent falls since last visit Some cold and cough Eating less than normal Taking vitamins  Nausea, abdominal upset Decreased appetite, feels bloated, some retching Chronic but insidiously worsening over the last year Chronic intermittent abdominal pain No clear pattern or association with p.o. intake  Depressed mood   Anxious mood Worse lately Crying 70% of the day Patient wonders if the holidays make this worse Lexapro doesn't seem to be helping Lorazepam seems to help with anxiety, but tries to take it only once a week when she's going out Has gone out of the house only once in the last month Virtual visit today because of anxiety Still able to do ADLs, iADLs Still taking care of self and two pets (Physicist, medical and cat) Previously with the help of CCM was set up with [employer] therapy program at the grad school, also saw peer counselor Daughter coming down from [facility] tomorrow, staying through Christmas No current thoughts, plans, or intent for SI/HI Reports strong protective factors against suicide attempt, including religion and family  Fibromyalgia Previously referred to PT, but  insurance co-pay was too high to afford at this time Has new insurance on Jan 1, open to scheduling after that with more affordable co-pay Would be willing to consider pain management clinic Discussed concerns about opioids  ROS: per HPI  Pertinent PMHx:  Patient Active Problem List   Diagnosis Date Noted   Nausea in adult 11/09/2021   Balance problems 09/22/2021   Incontinence 09/22/2021   Healthcare maintenance 07/30/2021   Obesity (BMI 30.0-34.9) 05/25/2021   Anosmia 05/25/2021   Smoking history 03/29/2021   Osteoporosis 01/18/2021   Foot pain, left 12/09/2020   Encephalomalacia on imaging study 08/25/2020   Fatty liver 05/31/2020   Chronic pain syndrome 04/23/2019   DDD (degenerative disc disease), cervical 04/16/2019   CKD stage 3 due to type 2 diabetes mellitus (HCC) 04/15/2019   Hypertension associated with diabetes (HCC) 07/18/2018   GERD (gastroesophageal reflux disease) 07/18/2018   OSA (obstructive sleep apnea) 07/18/2018   Type 2 diabetes mellitus with diabetic polyneuropathy, with long-term current use of insulin (HCC) 07/18/2018   Fibromyalgia 07/18/2018   Mood disorder (HCC) 07/18/2018   Hyperlipidemia associated with type 2 diabetes mellitus (HCC) 07/18/2018   Legally blind 09/01/2003     Exam:  There were no vitals taken for this visit.  General: Awake and alert, intermittently tearful, somewhat anxious affect Respiratory: No respiratory distress, speaking in full sentences  Assessment/Plan:  Type 2 diabetes mellitus with diabetic polyneuropathy, with long-term current use of insulin (HCC) Stable.  Tolerating medications well, no episodes of hypoglycemia or other negative side effects.  Plan for next A1c either at lab only visit at patient's convenience or could do  next appointment with Dr. Marzetta Merino on 12/06/2021.  Balance problems Stable.  No falls since last visit.  Still experiencing weakness, off-balance sensation. Has appointment with vestibular  rehab at the beginning of January.  Nausea in adult Worsening.  Feelings of abdominal bloating, decreased appetite, chronic abdominal pain, and some retching postprandially.  No clear pattern or association with p.o. intake.  Considered gastroparesis, we are working diligently to get diabetes under control.  Will refer to GI for possible gastroenterology study and further work-up.  Mood disorder (HCC) Worsening.  Patient does not believe Lexapro is helping.  Reports lorazepam helps with anxiety, but she only takes it once a week when she is going out of the house.  No SI/HI, has strong protective factors.  Currently enrolled in [employer] grad school therapy program and has Immunologist.  Has neuropsych appointment next week.  Decline to make any changes to medications today given upcoming neuropsych appointment, will await their recommendations prior to making changes.  Fibromyalgia Worsening.  Likely multifactorial, including increased depressed mood and anxious mood.  Previous referral to PT, but unable to afford co-pay.  As mentioned starting January 1, is hopeful that co-pay manageable.  Will re-refer to PT. Patient amenable to pain clinic referral, will place today.    Time spent during visit with patient: 40 minutes  Ezequiel Essex, MD   PGY-2 [facility]

## 2021-11-08 NOTE — Telephone Encounter (Signed)
Left message informing pt that her patient assistance application is pending and needs updated proof of income.   Call back # [phone]

## 2021-11-09 ENCOUNTER — Encounter: Payer: Self-pay | Admitting: Family Medicine

## 2021-11-09 DIAGNOSIS — R110 Nausea: Secondary | ICD-10-CM | POA: Insufficient documentation

## 2021-11-09 NOTE — Assessment & Plan Note (Signed)
>>  ASSESSMENT AND PLAN FOR FIBROMYALGIA WRITTEN ON 11/09/2021  9:46 AM BY Fayette Pho, MD  Worsening.  Likely multifactorial, including increased depressed mood and anxious mood.  Previous referral to PT, but unable to afford co-pay.  As mentioned starting January 1, is hopeful that co-pay manageable.  Will re-refer to PT. Patient amenable to pain clinic referral, will place today.

## 2021-11-09 NOTE — Assessment & Plan Note (Signed)
Worsening.  Feelings of abdominal bloating, decreased appetite, chronic abdominal pain, and some retching postprandially.  No clear pattern or association with p.o. intake.  Considered gastroparesis, we are working diligently to get diabetes under control.  Will refer to GI for possible gastroenterology study and further work-up.

## 2021-11-09 NOTE — Assessment & Plan Note (Signed)
Worsening.  Patient does not believe Lexapro is helping.  Reports lorazepam helps with anxiety, but she only takes it once a week when she is going out of the house.  No SI/HI, has strong protective factors.  Currently enrolled in [employer] grad school therapy program and has International aid/development worker.  Has neuropsych appointment next week.  Decline to make any changes to medications today given upcoming neuropsych appointment, will await their recommendations prior to making changes.

## 2021-11-09 NOTE — Assessment & Plan Note (Signed)
Stable.  No falls since last visit.  Still experiencing weakness, off-balance sensation. Has appointment with vestibular rehab at the beginning of January.

## 2021-11-09 NOTE — Assessment & Plan Note (Signed)
Stable.  Tolerating medications well, no episodes of hypoglycemia or other negative side effects.  Plan for next A1c either at lab only visit at patient's convenience or could do next appointment with Dr. Cyndi Bender on 12/06/2021.

## 2021-11-09 NOTE — Assessment & Plan Note (Signed)
Worsening.  Likely multifactorial, including increased depressed mood and anxious mood.  Previous referral to PT, but unable to afford co-pay.  As mentioned starting January 1, is hopeful that co-pay manageable.  Will re-refer to PT. Patient amenable to pain clinic referral, will place today.

## 2021-11-14 ENCOUNTER — Other Ambulatory Visit: Payer: Self-pay | Admitting: Family Medicine

## 2021-11-14 DIAGNOSIS — I10 Essential (primary) hypertension: Secondary | ICD-10-CM

## 2021-11-14 DIAGNOSIS — M797 Fibromyalgia: Secondary | ICD-10-CM

## 2021-11-14 DIAGNOSIS — G894 Chronic pain syndrome: Secondary | ICD-10-CM

## 2021-11-14 DIAGNOSIS — M62838 Other muscle spasm: Secondary | ICD-10-CM

## 2021-11-16 ENCOUNTER — Encounter: Payer: Medicare (Managed Care) | Admitting: Psychology

## 2021-11-20 ENCOUNTER — Encounter: Payer: Self-pay | Admitting: Family Medicine

## 2021-11-22 ENCOUNTER — Telehealth: Payer: Self-pay | Admitting: Licensed Clinical Social Worker

## 2021-11-22 ENCOUNTER — Ambulatory Visit: Payer: Medicare (Managed Care)

## 2021-11-22 NOTE — Chronic Care Management (AMB) (Signed)
° °   Clinical Social Work  Care Management   Phone Outreach    11/22/2021 Name: Cassandra Reid MRN: [id number] DOB: 06/11/1956  [name] is a 66 y.o. year old female who is a primary care patient of Fayette Pho, MD .   Reason for referral: [facility]  and Mental Health Counseling and [facility].    F/U phone call today to assess needs, progress and barriers with care plan goals.   Telephone outreach was unsuccessful. A HIPPA compliant phone message was left for the patient providing contact information and requesting a return call.   Plan: CCM LCSW will wait for return call. If no return call is received, will disconnect from care team due to transitioning from practice. Patient informed to call office for ongoing needs   Review of patient status, including review of consultants reports, relevant laboratory and other test results, and collaboration with appropriate care team members and the patient's provider was performed as part of comprehensive patient evaluation and provision of care management services.    Sammuel Hines, LCSW Care Management & Coordination  [facility] / [employer]   [phone]

## 2021-11-24 ENCOUNTER — Other Ambulatory Visit: Payer: Self-pay

## 2021-11-24 ENCOUNTER — Ambulatory Visit: Payer: Medicare PPO | Admitting: Psychology

## 2021-11-24 ENCOUNTER — Encounter: Payer: Self-pay | Admitting: Psychology

## 2021-11-24 ENCOUNTER — Ambulatory Visit (INDEPENDENT_AMBULATORY_CARE_PROVIDER_SITE_OTHER): Payer: Medicare PPO | Admitting: Psychology

## 2021-11-24 DIAGNOSIS — I671 Cerebral aneurysm, nonruptured: Secondary | ICD-10-CM | POA: Diagnosis not present

## 2021-11-24 DIAGNOSIS — F331 Major depressive disorder, recurrent, moderate: Secondary | ICD-10-CM

## 2021-11-24 DIAGNOSIS — I4719 Other supraventricular tachycardia: Secondary | ICD-10-CM | POA: Insufficient documentation

## 2021-11-24 DIAGNOSIS — I471 Supraventricular tachycardia: Secondary | ICD-10-CM | POA: Insufficient documentation

## 2021-11-24 DIAGNOSIS — R4189 Other symptoms and signs involving cognitive functions and awareness: Secondary | ICD-10-CM

## 2021-11-24 DIAGNOSIS — F411 Generalized anxiety disorder: Secondary | ICD-10-CM | POA: Insufficient documentation

## 2021-11-24 DIAGNOSIS — I6381 Other cerebral infarction due to occlusion or stenosis of small artery: Secondary | ICD-10-CM

## 2021-11-24 DIAGNOSIS — F329 Major depressive disorder, single episode, unspecified: Secondary | ICD-10-CM | POA: Insufficient documentation

## 2021-11-24 NOTE — Progress Notes (Signed)
NEUROPSYCHOLOGICAL EVALUATION Cassandra Reid. [facility] Department of Neurology  Date of Evaluation: November 24, 2021  Reason for Referral:   Cassandra Reid is a 66 y.o. right-handed Caucasian female referred by Shon Millet, D.O., to characterize her current cognitive functioning and assist with diagnostic clarity and treatment planning in the context of subjective cognitive decline and history of a non-ruptured cerebral aneurysm s/p attempted clipping.   Assessment and Plan:   Clinical Impression(s): Scores across stand-alone and embedded performance validity measures were consistently below expectation. Cassandra Reid was extremely tearful and visibly distressed throughout both interview and testing procedures. Overall, the results of the evaluation should not be taken at face value and very likely underestimate her true cognitive abilities. I do not believe that Cassandra Reid was purposefully performing poorly; rather, I believe that the extent of acute psychiatric distress severely impacted her ability to consistently focus and attend to tasks throughout the evaluation.  Despite validity concerns across testing, the cause for subjective day-to-day dysfunction is likely multifactorial in nature. Cassandra Reid does have a history of past surgical procedures involving the brain and non-ruptured aneurysm repair. I was unable to locate any prior testing immediately after these procedures which would have formally documented cognitive changes. In addition to this, Cassandra Reid cognition could be impacted by underlying cerebrovascular changes given her medical history (e.g., hypertension, atrial tachycardia, hyperlipidemia, type II diabetes, untreated obstructive sleep apnea, and chronic kidney disease), as well as recent neuroimaging suggesting a few small, remote lacunar infarcts in the right caudate and left internal capsule. Despite Cassandra Reid discontinuing the evaluation early due to transportation  concerns and being unable to complete mood-related questionnaires, I believe that she is experiencing acute symptoms of moderate to severe anxiety and depression. Much of this is likely due to semi-acute marital distress surrounding her husband. She further reported diffuse and debilitating chronic pain and ongoing sleep dysfunction. Thinking broadly, the presence of cerebrovascular changes, chronic pain, sleep disruption, and moderate to severe psychiatric distress will certainly affect cognitive abilities, particularly surrounding processing speed, attention/concentration, executive functioning, and learning and memory. In all likelihood, it is a combination of these factors, with mood, pain, and sleep factors exacerbating neurological changes due to her aneurysm and vascular history, which is creating subjective dysfunction.  Given testing validity concerns, I cannot rule out a neurodegenerative condition like Alzheimer's disease (however, it also means that I cannot rule one in either). It is worth stating that her memory patterns are not consistent with an amnestic profile and she is young relative to when this disease progress typically presents itself, making it unlikely at the present time. Continued medical monitoring will be important moving forward.   Recommendations: A combination of medication and psychotherapy has been shown to be most effective at treating symptoms of anxiety and depression. She reported that prior attempts to treat depression and anxiety with Lexapro were entirely ineffective. As such, Cassandra Reid is encouraged to speak with her prescribing physician regarding medication adjustments to optimally manage these symptoms. Psychiatry could be consulted if necessary.  Cassandra Reid is also strongly encouraged to engage in short-term psychotherapy to address symptoms of psychiatric distress. This is especially true due to the recent exacerbation of depressive symptoms and feelings of  hopelessness given ongoing marital distress. She would benefit from an active and collaborative therapeutic environment, rather than one purely supportive in nature. Recommended treatment modalities include Cognitive Behavioral Therapy (CBT) or Acceptance and Commitment Therapy (ACT).  I agree with Ms.  Reid's plans to start vestibular therapy and complete an updated sleep study in the near future. Regarding the latter, untreated sleep apnea (as it currently is) will certainly worsen memory and other cognitive abilities. It will also increase her risk for stroke, heart attack, and dementia. As such, this condition is very important to treat appropriately.   Should cognitive dysfunction persist once mood concerns are effectively treated, a repeat neuropsychological evaluation could be ordered at that time.  Cassandra Reid is encouraged to attend to lifestyle factors for brain health (e.g., regular physical exercise, good nutrition habits, regular participation in cognitively-stimulating activities, and general stress management techniques), which are likely to have benefits for both emotional adjustment and cognition. In fact, in addition to promoting good general health, regular exercise incorporating aerobic activities (e.g., brisk walking, jogging, cycling, etc.) has been demonstrated to be a very effective treatment for depression and stress, with similar efficacy rates to both antidepressant medication and psychotherapy. Optimal control of vascular risk factors (including safe cardiovascular exercise and adherence to dietary recommendations) is encouraged. Continued participation in activities which provide mental stimulation and social interaction is also recommended.  When learning new information, she would benefit from information being broken up into small, manageable pieces. She may also find it helpful to articulate the material in her own words and in a context to promote encoding at the onset of a new  task. This material may need to be repeated multiple times to promote encoding. Reducing anxiety may also aid in the retrieval of information.  Memory can be improved using internal strategies such as rehearsal, repetition, chunking, mnemonics, association, and imagery. External strategies such as written notes in a consistently used memory journal, visual and nonverbal auditory cues such as a calendar on the refrigerator or appointments with alarm, such as on a cell phone, can also help maximize recall.    To address problems with processing speed, she may wish to consider:   -Ensuring that she is alerted when essential material or instructions are being presented   -Adjusting the speed at which new information is presented   -Allowing for more time in comprehending, processing, and responding in conversation  To address problems with fluctuating attention, she may wish to consider:   -Avoiding external distractions when needing to concentrate   -Limiting exposure to fast paced environments with multiple sensory demands   -Writing down complicated information and using checklists   -Attempting and completing one task at a time (i.e., no multi-tasking)   -Verbalizing aloud each step of a task to maintain focus   -Reducing the amount of information considered at one time  Review of Records:   Ms. Crespin was seen by [facility] Metta Clines, D.O.) on 09/29/2018 for an evaluation given her history of a cerebral aneurysm. Briefly, she had clipping and then coiling for a non-ruptured aneurysm x2 about 14 years prior. She also reported that her doctors identified another aneurysm but nothing that yet required intervention. Since the surgeries, she reported spasms on top and side of head. They occur off and on for a couple of days, occurring every five days. She denied associated nausea, vomiting, photophobia, phonophobia, visual disturbance, or unilateral numbness or weakness. She was prescribed  gabapentin by her prior neurologist but never started it until she could establish care. Prior treatment included tizanidine which was helpful but no longer covered by insurance. She also reported a history of fibromyalgia and significant anxiety. Dr. Tomi Likens planned to check prior imaging and made medication adjustments.  Follow-up was recommended in five months.   She met with Dr. Everlena Cooper again on 08/05/2020. For headaches, she was started gabapentin 300mg  three times daily. As it was ineffective, she was started on tizanidine 2mg  three times daily and tapered off of gabapentin. She reported increased habitual headaches about a month prior to that appointment. She described feeling a pressure on the top of her head, as well as paroxysmal stabbing pain all over. Pressure lasts about 1-2 days and occurs once per week. The paroxsymal pain lasts a minute off and on during the day and occurs daily. About a month prior, she started getting twitches on the right top of her mouth and under her right eye. She also reported feeling a little more off-balance and more forgetful. She has also been having radiating neck pain and was evaluated by neurosurgery at [facility]. MRI of the cervical spine on 07/26/2020 showed moderate degenerative disc disease at C5-6 with left paracentral disc herniation possibly affecting the left C6 nerve root, as well as moderate to severe degenerative disc disease at C6-7 with bilateral uncovertebral spurs and moderate bilateral neuroforaminal stenosis. She was prescribed tramadol but had not taken it. Medication adjustments were made by Dr. [facility].  She met again (and most recently) with Dr. 09/25/2020 on 07/17/2021. Due to increased headache frequency, she had an MRI and MRA of the head performed on 08/26/2020. Results suggested a few small, remote lacunar infarcts in the right caudate and left internal capsule, as well as postoperative changes from her previous left pterional craniotomy with aneurysm clip at  the level of the left ICA terminus. No acute intracranial abnormality or new/recurrence aneurysm was observed. During this appointment, she reported feeling more "teary" and forgetful. She underwent a competency evaluation in July. While she did demonstrate memory deficits, it was felt that she can manage her finances adequately. She reported ongoing pain in her legs, chest wall, and ribs. She continues to experience daily headaches. Ultimately, Ms. Siwik was referred for a comprehensive neuropsychological evaluation to characterize her cognitive abilities and to assist with diagnostic clarity and treatment planning.   Brain MRA on 12/15/2018 revealed a 2 x 4 mm infundibulum or aneurysm at the supraclinoid right ICA, with the shape favoring infundibulum. Artifact from her aneurysm clip was noted to obscure the left circle-of-Willis. Brain MRA on 08/26/2020 is described above. It also noted mild postoperative left frontal encephalomalacia and gliosis.   Past Medical History:  Diagnosis Date   Allergic rhinitis 09/26/2018   Anosmia    Arthritis    Asthma    Balance problems 09/22/2021   C. difficile colitis 07/18/2018   Cerebral aneurysm without rupture    Chest pain 03/26/2020   Chronic pain syndrome 04/23/2019   CKD stage 3 due to type 2 diabetes mellitus 04/15/2019   DDD (degenerative disc disease), cervical 04/16/2019   Encephalomalacia on imaging study 08/25/2020   Brain MRI 08/25/21: Few scattered areas of encephalomalacia and gliosis within the overlying left frontal lobe most likely postoperative in nature.   Fatty liver 05/31/2020   Fibromyalgia    Former smoker    Generalized anxiety disorder    GERD (gastroesophageal reflux disease)    Heart murmur    History of chicken pox    History of colon polyps    Hyperlipidemia associated with type 2 diabetes mellitus 07/18/2018   Hypertension associated with diabetes 07/18/2018   IBS (irritable bowel syndrome)    Incontinence 09/22/2021    Bowel and bladder.  Ischemic brain injury 05/29/2021   Lacunar infarction 05/25/2021   Brain MRI 08/25/21: Few small remote lacunar infarcts noted at the right caudate and left internal capsule   Legally blind    Major depressive disorder    Migraines    Myalgia 04/23/2019   OSA (obstructive sleep apnea) 07/18/2018   not using CPAP machine (broken)   Osteoporosis 01/18/2021   Paroxysmal atrial tachycardia    On telemetry   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   Type 2 diabetes mellitus with diabetic polyneuropathy, with long-term current use of insulin 07/18/2018    Past Surgical History:  Procedure Laterality Date   ABDOMINAL HYSTERECTOMY  2012   APPENDECTOMY  1972   CEREBRAL ANEURYSM REPAIR Left 2004   Aneurysm clip in place near the level of the left ICA on Brain MRI 08/2020. Also coiling performed   CHOLECYSTECTOMY  2015   CRANIOTOMY Left    left pterional craniotomy   MENISCUS REPAIR Bilateral     Current Outpatient Medications:    Ascorbic Acid (VITAMIN C PO), Take 1 tablet by mouth daily. (Patient not taking: No sig reported), Disp: , Rfl:    atorvastatin (LIPITOR) 40 MG tablet, TAKE 1 TABLET BY MOUTH EVERYDAY AT BEDTIME, Disp: 90 tablet, Rfl: 1   baclofen (LIORESAL) 10 MG tablet, TAKE 1 TABLET BY MOUTH TWICE A DAY AS NEEDED FOR MUSCLE SPASM, Disp: 60 tablet, Rfl: 1   BD PEN NEEDLE NANO 2ND GEN 32G X 4 MM MISC, USE WITH INSULIN, Disp: 100 each, Rfl: 4   dapagliflozin propanediol (FARXIGA) 10 MG TABS tablet, Take 1 tablet (10 mg total) by mouth daily before breakfast. This is a patient assistance medication. Patient may not be approved and/or have medication. Please ask and verify when performing med review. (Patient not taking: Reported on 11/01/2021), Disp: 30 tablet, Rfl: 0   dicyclomine (BENTYL) 20 MG tablet, Take 1 tablet (20 mg total) by mouth 2 (two) times daily as needed for spasms.,  Disp: 20 tablet, Rfl: 0   escitalopram (LEXAPRO) 5 MG tablet, TAKE 2 TABLETS BY MOUTH EVERY DAY, Disp: 60 tablet, Rfl: 1   famotidine (PEPCID) 20 MG tablet, Take 1 tablet (20 mg total) by mouth 2 (two) times daily., Disp: 60 tablet, Rfl: 3   fluticasone (FLONASE) 50 MCG/ACT nasal spray, Place 1 spray into both nostrils daily as needed for allergies or rhinitis., Disp: 9.9 mL, Rfl: 1   furosemide (LASIX) 40 MG tablet, Take 1 tablet (40 mg total) by mouth daily. (Patient not taking: Reported on 09/13/2021), Disp: 90 tablet, Rfl: 3   gabapentin (NEURONTIN) 600 MG tablet, TAKE 0.5-1 TABLET (300-600 MG TOTAL) BY MOUTH 3 (THREE) TIMES DAILY., Disp: 90 tablet, Rfl: 3   insulin aspart (FIASP) 100 UNIT/ML FlexTouch Pen, Inject 15 Units into the skin in the morning, at noon, and at bedtime. This is a patient assistance medication. Patient may not be approved and/or have medication. Please ask and verify when performing med review., Disp: 15 mL, Rfl: 0   insulin degludec (TRESIBA) 100 UNIT/ML FlexTouch Pen, Inject 44 Units into the skin daily. This is a patient assistance medication. Patient may not be approved and/or have medication. Please ask and verify when performing med review., Disp: 15 mL, Rfl: 0   KLOR-CON M20 20 MEQ tablet, TAKE 1 TABLET BY MOUTH TWICE A DAY, Disp: 180 tablet, Rfl: 1   Lancet Devices (ONE TOUCH [employer]  LANCING DEV) MISC, Please use to check blood sugar up to 4 times daily. E11.42, Disp: 1 each, Rfl: 0   Lancets Misc. (ACCU-CHEK SOFTCLIX LANCET DEV) KIT, Use as instructed to check blood sugar 4 times daily. Dx E11.42, Disp: 1 kit, Rfl: prn   lisinopril (ZESTRIL) 5 MG tablet, Take 1 tablet (5 mg total) by mouth 2 (two) times daily., Disp: 60 tablet, Rfl: 3   loratadine (CLARITIN) 10 MG tablet, Take 1 tablet (10 mg total) by mouth daily., Disp: 30 tablet, Rfl: 11   LORazepam (ATIVAN) 0.5 MG tablet, TAKE 1 TABLET BY MOUTH EVERY DAY AS NEEDED FOR ANXIETY, Disp: 90 tablet, Rfl: 0   magnesium  oxide (MAG-OX) 400 (241.3 Mg) MG tablet, Take 1 tablet (400 mg total) by mouth 2 (two) times daily., Disp: 30 tablet, Rfl: 0   metFORMIN (GLUCOPHAGE-XR) 750 MG 24 hr tablet, Take 1 tablet (750 mg total) by mouth 2 (two) times daily with a meal., Disp: 180 tablet, Rfl: 3   Multiple Vitamin (MULTIVITAMIN PO), Take 1 tablet by mouth daily., Disp: , Rfl:    ondansetron (ZOFRAN) 4 MG tablet, Take 1 tablet (4 mg total) by mouth every 6 (six) hours., Disp: 12 tablet, Rfl: 0   OneTouch [employer] Lancets 74F MISC, Please use to check blood sugar up to 4 times daily. E11.42, Disp: 100 each, Rfl: 12   Semaglutide,0.25 or 0.5MG /DOS, (OZEMPIC, 0.25 OR 0.5 MG/DOSE,) 2 MG/1.5ML SOPN, Inject 0.5 mg into the skin once a week. (Patient taking differently: Inject 0.25 mg into the skin once a week.), Disp: 1.5 mL, Rfl: 3   sucralfate (CARAFATE) 1 g tablet, TAKE 1 TABLET (1 G TOTAL) BY MOUTH 4 (FOUR) TIMES DAILY - WITH MEALS AND AT BEDTIME., Disp: 120 tablet, Rfl: 2   traZODone (DESYREL) 50 MG tablet, Take 0.5 tablets (25 mg total) by mouth at bedtime as needed for sleep., Disp: 45 tablet, Rfl: 1  Clinical Interview:   The following information was obtained during a clinical interview with Ms. Qian prior to cognitive testing.  Cognitive Symptoms: Decreased short-term memory: Endorsed. Primary examples included losing information quite rapidly, trouble recalling numbers, losing her train of thought, and misplacing objects around her residence. Memory difficulties were said to be present for the past 18 years following her initial brain surgery to repair a non-ruptured aneurysm. However, she described observing more progressive decline over the past few years.  Decreased long-term memory: Denied. Decreased attention/concentration: Unclear. She noted that it would be hard to tell given that she has nothing in her daily life currently which would sufficiently test these abilities.  Reduced processing speed: Variably so. While  she noted times where her mind feels "out of it" and "foggy," she also described times where her mind moves "a mile a minute."  Difficulties with executive functions: Unclear. She noted that it would be hard to tell given that she has nothing in her daily life currently which would sufficiently test these abilities. She did deny perceived trouble with impulsivity or any significant personality changes.  Difficulties with emotion regulation: Denied. Difficulties with receptive language: Denied. Difficulties with word finding: Denied. Decreased visuoperceptual ability: Denied.  Difficulties completing ADLs: Denied. She does not drive. However, much if this is due to be being legally blind and having very minimal peripheral vision. Vision loss was said to date back to her initial aneurysm procedure.   Additional Medical History: History of traumatic brain injury/concussion: Denied. History of stroke: Recent imaging revealed a few remote lacunar  infarcts in the right caudate and left internal capsule. These were likely asymptomatic.  History of seizure activity: Denied. History of known exposure to toxins: Denied. Symptoms of chronic pain: Endorsed. Medical records suggest both fibromyalgia and chronic pain syndrome. She noted that she had been diagnosed with fibromyalgia prior to her initial aneurysm procedure. Diffuse pain has persisted throughout much of her life and is often debilitating. Current pain was noteworthy in her neck, back, and feet.  Experience of frequent headaches/migraines: Endorsed. While she denied what she referred to as typical headache symptoms, she reported experiencing "sharp spasms" all over her head on a regular basis. Symptoms were said to be very brief in nature. She attributed this to past brain surgeries.  Frequent instances of dizziness/vertigo: Endorsed. Trouble with dizziness and feeling lightheaded were said to impact balance. She wondered if these were largely due to  medication side effects.   Sensory changes: As mentioned above, she is legally blind with vision loss occurring following initial aneurysm procedures. She noted having minimal peripheral vision but can see centrally if content is large and with appropriate contrast. She noted also losing her sense of smell around the time of her first brain surgery. Other sensory changes/difficulties (e.g., hearing or taste) were denied.  Balance/coordination difficulties: Endorsed. She reported ongoing balance instability, generally described as weakness sin her legs. One side was not said to be worse than the other and dizziness/lightheadedness was said to contribute. She has a walker and cane for added support but often does not use them. She denied any recent falls but did acknowledge catching herself on walls a few times. She noted planning on starting vestibular therapy in the near future.  Other motor difficulties: Largely denied. However, she did wonder about diminished strength in her hands and noted that her handwriting has diminished over time. No consistent tremors were reported.   Sleep History: Estimated hours obtained each night: Unclear. She described her sleep as very broken with her frequently waking throughout the night, making a numerical estimation hard to obtain.  Difficulties falling asleep: Denied. Difficulties staying asleep: Endorsed. Feels rested and refreshed upon awakening: Denied.  History of snoring: Endorsed. History of waking up gasping for air: Endorsed. Witnessed breath cessation while asleep: Endorsed. She acknowledged a history of obstructive sleep apnea. While she has a CPAP machine, she noted that this machine was broken and she had not used it in over a year. She did note that she was scheduled to meet with pulmonology and have an updated sleep study performed in the next month or so.   History of vivid dreaming: Denied. Excessive movement while asleep: Denied. Instances of  acting out her dreams: Denied.  Psychiatric/Behavioral Health History: Depression: She acknowledged a longstanding history of generally mild depressive symptoms dating back to her initial aneurysm procedure and functional limitations experienced afterwards. However, within the past year, she reported that her husband left her and depressive symptoms have been greatly exacerbated. She described herself as crying daily and feeling hopeless and lonely. Lexapro was said to be entirely ineffective. She denied previously working with an Haematologist. Current or remote suicidal ideation, intent, or plan was denied.  Anxiety: She acknowledged a longstanding history of generalized anxiety symptoms which have ranged in severity over the years. As with depressive symptoms, these have been notably exacerbated within the past year. She also expressed specific anxiety surrounding testing procedures. She reported using Ativan sparingly.  Mania: Denied. Trauma History: Denied. Visual/auditory hallucinations: Denied. Delusional thoughts: Denied.  Tobacco: Denied. Alcohol: She denied current alcohol consumption as well as a history of problematic alcohol abuse or dependence.  Recreational drugs: Denied.  Family History: Problem Relation Age of Onset   Alcohol abuse Mother    Diabetes Mother    Hypertension Mother    Kidney disease Mother    COPD Father    Diabetes Father    Early death Father    Heart disease Father    Hyperlipidemia Father    Hypertension Father    Diabetes Sister    Hypertension Sister    Breast cancer Sister    Cancer Maternal Grandmother    Alcohol abuse Maternal Grandfather    Cancer Maternal Grandfather    Cancer Paternal Grandmother    Cancer Paternal Grandfather    Diabetes Sister    Hypertension Sister    Kidney disease Sister    This information was confirmed by Ms. Marcial Pacas.  Academic/Vocational History: Highest level of educational attainment: 14 years. She  graduated from high school and earned an Associate's degree at around age 28. She described herself as a good (A/B) student in academic settings. Math was described as a notable relative weakness.  History of developmental delay: Denied. History of grade repetition: Denied. Enrollment in special education courses: Denied. History of LD/ADHD: Denied.  Employment: She has been on disability since her initial aneurysm procedure. 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   Evaluation Results:   Behavioral Observations: Ms. Molino was unaccompanied, arrived to her appointment on time, and was appropriately dressed and groomed. She appeared alert and oriented. Observed gait and station were within normal limits. Gross motor functioning appeared intact upon informal observation and no abnormal movements (e.g., tremors) were noted. Throughout the entire interview, Ms. Kersting was tearful and appeared extremely anxious. Her face was flushed and she was often seen wringing her hands in her lap. Spontaneous speech was fluent and word finding difficulties were not observed during the clinical interview. Thought processes were coherent, organized, and normal in content. Insight into her cognitive difficulties appeared adequate.   During testing, Ms. Barbero was also extremely tearful and very anxious. Brief breaks had to be taken between tasks due to crying spells. Despite this, sustained attention was appropriate. Task engagement was adequate and she persisted when challenged. However, towards the end of the evaluation, Ms. Yokum abruptly became extremely concerned surrounding her transportation back home. While it had been noted that the psychometrist would call for transportation prior to the end of testing, she became very fearful that she would miss her ride or cause them to wait for a prolonged period of time. As such, Ms. Weird abruptly ended the  evaluation prematurely. As a result, the domain of executive functioning was sampled in a more limited capacity, safety/judgment was unable to be assessed, and she was unable to complete mood-related questionnaires. Overall, Ms. Heindl was cooperative with the clinical interview and subsequent testing procedures.   Adequacy of Effort: The validity of neuropsychological testing is limited by the extent to which the individual being tested may be assumed to have exerted adequate effort during testing. Ms. Luo expressed her intention to perform to the best of her abilities and exhibited adequate task engagement and persistence. Scores across stand-alone and embedded performance validity measures were consistently below expectation. As stated above, Ms. Romanek was extremely tearful and visibly distressed throughout both interview and testing procedures. Overall, the results of  the evaluation should not be taken at face value and likely underestimate her true cognitive abilities. I do not believe that Ms. Holloran was purposefully performing poorly; rather, I believe that the extent of acute psychiatric distress severely impacted her ability to consistently focus and attend to tasks throughout the evaluation.  Test Results: Ms. Civello was mildly disoriented at the time of the current evaluation. She incorrectly stated her address or full phone number and was unable to state the current date. When asked why she was completing the evaluation, she responded with "to see if I'm crazy."   Intellectual abilities based upon educational and vocational attainment were estimated to be in the average range. Premorbid abilities were estimated to be within the average range based upon a single-word reading test.   Processing speed was exceptionally low to well below average. Basic attention was exceptionally low to well below average. More complex attention (e.g., working memory) was well below average to below average. Executive  functioning was variable, ranging from the exceptionally low to average normative ranges.  Assessed receptive language abilities were below average. Ms. Tome generally did not exhibit any difficulties comprehending task instructions and answered all questions asked of her appropriately. Assessed expressive language was mildly variable. Phonemic fluency was below average to average, semantic fluency was exceptionally low to below average, and confrontation naming exceptionally low to well below average.     Assessed visuospatial/visuoconstructional abilities were variable, ranging from the exceptionally low to average normative ranges.    Learning (i.e., encoding) of novel verbal information was exceptionally low to well below average. Spontaneous delayed recall (i.e., retrieval) of previously learned information was mildly improved, ranging from the well below average to below average normative ranges. Retention rates were 57% across a story learning task, 75% across a list learning task, and 44% across a figure drawing task. Performance across recognition tasks was exceptionally low across a list learning task but below average to average across story and figure tasks, suggesting some evidence for information consolidation.  Tables of Scores:   Note: This summary of test scores accompanies the interpretive report and should not be considered in isolation without reference to the appropriate sections in the text. Descriptors are based on appropriate normative data and may be adjusted based on clinical judgment. Terms such as "Within Normal Limits" and "Outside Normal Limits" are used when a more specific description of the test score cannot be determined.       Percentile - Normative Descriptor > 98 - Exceptionally High 91-97 - Well Above Average 75-90 - Above Average 25-74 - Average 9-24 - Below Average 2-8 - Well Below Average < 2 - Exceptionally Low       Validity:   DESCRIPTOR       ACS  Word Choice: --- --- Outside Normal Limits  RBANS Effort Index: --- --- Outside Normal Limits  WAIS-IV Reliable Digit Span: --- --- Outside Normal Limits  D-KEFS Color Word Effort Index: --- --- Outside Normal Limits       Orientation:      Raw Score Percentile   NAB Orientation, Form 1 25/29 --- ---       Cognitive Screening:      Raw Score Percentile   SLUMS: 16/30 --- ---       RBANS, Form A: Standard Score/ Scaled Score Percentile   Total Score 53 <1 Exceptionally Low  Immediate Memory 61 <1 Exceptionally Low    List Learning 2 <1 Exceptionally Low    Story  Memory 5 5 Well Below Average  Visuospatial/Constructional 78 7 Well Below Average    Figure Copy 10 50 Average    Line Orientation 8/20 <2 Exceptionally Low  Language 51 <1 Exceptionally Low    Picture Naming 6/10 <2 Exceptionally Low    Semantic Fluency 2 <1 Exceptionally Low  Attention 60 <1 Exceptionally Low    Digit Span 4 2 Well Below Average    Coding 4 2 Well Below Average  Delayed Memory 56 <1 Exceptionally Low    List Recall 3/10 10-16 Below Average    List Recognition 12/20 <2 Exceptionally Low    Story Recall 4 2 Well Below Average    Story Recognition 8/12 8-15 Below Average    Figure Recall 6 9 Below Average    Figure Recognition 6/8 30-52 Average        Intellectual Functioning:      Standard Score Percentile   Test of Premorbid Functioning: 100 50 Average       Attention/Executive Function:     Trail Making Test (TMT): Raw Score (T Score) Percentile     Part A 57 secs.,  0 errors (32) 4 Well Below Average    Part B 124 secs.,  0 errors (33) 5 Well Below Average         Scaled Score Percentile   WAIS-IV Digit Span: 4 2 Well Below Average    Forward 3 1 Exceptionally Low    Backward 6 9 Below Average    Sequencing 5 5 Well Below Average       D-KEFS Color-Word Interference Test: Raw Score (Scaled Score) Percentile     Color Naming 46 secs. (4) 2 Well Below Average    Word Reading 38 secs.  (3) 1 Exceptionally Low    Inhibition 97 secs. (5) 5 Well Below Average      Total Errors 4 errors (8) 25 Average    Inhibition/Switching 103 secs. (6) 9 Below Average      Total Errors 12 errors (1) <1 Exceptionally Low       D-KEFS Verbal Fluency Test: Raw Score (Scaled Score) Percentile     Letter Total Correct 33 (9) 37 Average    Category Total Correct 24 (6) 9 Below Average    Category Switching Total Correct 11 (8) 25 Average    Category Switching Accuracy 10 (9) 37 Average      Total Set Loss Errors 0 (13) 84 Above Average      Total Repetition Errors 0 (13) 84 Above Average       Language:     Verbal Fluency Test: Raw Score (T Score) Percentile     Phonemic Fluency (FAS) 33 (40) 16 Below Average    Animal Fluency 11 (28) 2 Well Below Average        NAB Language Module, Form 1: T Score Percentile     Auditory Comprehension 39 14 Below Average    Naming 26/31 (29) 2 Well Below Average       Visuospatial/Visuoconstruction:      Raw Score Percentile   Clock Drawing: 9/10 --- Within Normal Limits        Scaled Score Percentile   WAIS-IV Block Design: 8 25 Average   Informed Consent and Coding/Compliance:   The current evaluation represents a clinical evaluation for the purposes previously outlined by the referral source and is in no way reflective of a forensic evaluation.   Ms. Osterberg was provided with a verbal description of the  nature and purpose of the present neuropsychological evaluation. Also reviewed were the foreseeable risks and/or discomforts and benefits of the procedure, limits of confidentiality, and mandatory reporting requirements of this provider. The patient was given the opportunity to ask questions and receive answers about the evaluation. Oral consent to participate was provided by the patient.   This evaluation was conducted by Christia Reading, Ph.D., ABPP-CN, board certified clinical neuropsychologist. Ms. Steinhilber completed a clinical interview with Dr.  Melvyn Novas, billed as one unit [phone], and 125 minutes of cognitive testing and scoring, billed as one unit [phone] and three additional units [id number]. Psychometrist Milana Kidney, B.S., assisted Dr. Melvyn Novas with test administration and scoring procedures. As a separate and discrete service, Dr. Melvyn Novas spent a total of 160 minutes in interpretation and report writing billed as one unit [phone] and two units 96133.

## 2021-11-24 NOTE — Progress Notes (Signed)
° °  Psychometrician Note   Cognitive testing was administered to Cassandra Reid by Cassandra Reid, B.S. (psychometrist) under the supervision of Dr. Christia Reid, Ph.D., licensed psychologist on 11/24/21. Cassandra Reid did not appear overtly distressed by the testing session per behavioral observation or responses across self-report questionnaires. Rest breaks were offered.    The battery of tests administered was selected by Dr. Christia Reid, Ph.D. with consideration to Cassandra Reid's current level of functioning, the nature of her symptoms, emotional and behavioral responses during interview, level of literacy, observed level of motivation/effort, and the nature of the referral question. This battery was communicated to the psychometrist. Communication between Dr. Christia Reid, Ph.D. and the psychometrist was ongoing throughout the evaluation and Dr. Christia Reid, Ph.D. was immediately accessible at all times. Dr. Christia Reid, Ph.D. provided supervision to the psychometrist on the date of this service to the extent necessary to assure the quality of all services provided.    Cassandra Reid will return within approximately 1-2 weeks for an interactive feedback session with Cassandra Reid at which time her test performances, clinical impressions, and treatment recommendations will be reviewed in detail. Cassandra Reid understands she can contact our office should she require our assistance before this time.  A total of 125 minutes of billable time were spent face-to-face with Cassandra Reid by the psychometrist. This includes both test administration and scoring time. Billing for these services is reflected in the clinical report generated by Dr. Christia Reid, Ph.D.  This note reflects time spent with the psychometrician and does not include test scores or any clinical interpretations made by Cassandra Reid. The full report will follow in a separate note.

## 2021-11-30 ENCOUNTER — Ambulatory Visit (INDEPENDENT_AMBULATORY_CARE_PROVIDER_SITE_OTHER): Payer: Medicare PPO | Admitting: Psychology

## 2021-11-30 ENCOUNTER — Other Ambulatory Visit: Payer: Self-pay

## 2021-11-30 DIAGNOSIS — F331 Major depressive disorder, recurrent, moderate: Secondary | ICD-10-CM

## 2021-11-30 DIAGNOSIS — F411 Generalized anxiety disorder: Secondary | ICD-10-CM

## 2021-11-30 DIAGNOSIS — I6381 Other cerebral infarction due to occlusion or stenosis of small artery: Secondary | ICD-10-CM | POA: Diagnosis not present

## 2021-11-30 DIAGNOSIS — R4189 Other symptoms and signs involving cognitive functions and awareness: Secondary | ICD-10-CM

## 2021-11-30 DIAGNOSIS — I671 Cerebral aneurysm, nonruptured: Secondary | ICD-10-CM

## 2021-11-30 NOTE — Progress Notes (Signed)
° °  Neuropsychology Feedback Session Eligha Bridegroom. [facility] Department of Neurology  Reason for Referral:   DARRIS CARACHURE is a 66 y.o. right-handed Caucasian female referred by Shon Millet, D.O., to characterize her current cognitive functioning and assist with diagnostic clarity and treatment planning in the context of subjective cognitive decline and history of a non-ruptured cerebral aneurysm s/p attempted clipping.   Feedback:   Ms. Koopman completed a comprehensive neuropsychological evaluation on 11/24/2021. Please refer to that encounter for the full report and recommendations. Briefly, scores across stand-alone and embedded performance validity measures were consistently below expectation. Ms. Lafoy was extremely tearful and visibly distressed throughout both interview and testing procedures. Overall, the results of the evaluation should not be taken at face value and very likely underestimate her true cognitive abilities. I do not believe that Ms. Guzzetta was purposefully performing poorly; rather, I believe that the extent of acute psychiatric distress severely impacted her ability to consistently focus and attend to tasks throughout the evaluation. Despite validity concerns across testing, the cause for subjective day-to-day dysfunction is likely multifactorial in nature. Ms. Flaming does have a history of past surgical procedures involving the brain and non-ruptured aneurysm repair. I was unable to locate any prior testing immediately after these procedures which would have formally documented cognitive changes. In addition to this, Ms. 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 Despite Ms. Fini discontinuing the evaluation early due to transportation concerns and being unable to complete mood-related questionnaires, I believe that she is experiencing acute symptoms of moderate to severe anxiety and depression. Much of this is likely due to semi-acute marital distress surrounding her husband. She further reported diffuse and debilitating chronic pain and ongoing sleep dysfunction. Thinking broadly, the presence of cerebrovascular changes, chronic pain, sleep disruption, and moderate to severe psychiatric distress will certainly affect cognitive abilities, particularly surrounding processing speed, attention/concentration, executive functioning, and learning and memory. In all likelihood, it is a combination of these factors, with mood, pain, and sleep factors exacerbating neurological changes due to her aneurysm and vascular history, which is creating subjective dysfunction.  Ms. Mcclurkin was unaccompanied during the current telephone call. She was within her residence while I was within my office. I discussed the limitations of evaluation and management by telemedicine and the availability of in person appointments. Ms. Resendes expressed her understanding and agreed to proceed. Content of the current session focused on the results of her neuropsychological evaluation. Ms. Witters was given the opportunity to ask questions and her questions were answered. She was encouraged to reach out should additional questions arise. A copy of her report was mailed at the conclusion of the visit.      20 minutes were spent preparing for and conducting the current feedback session with Ms. Gracelyn Nurse, billed as one unit [phone].

## 2021-12-04 ENCOUNTER — Other Ambulatory Visit: Payer: Self-pay | Admitting: Family Medicine

## 2021-12-04 DIAGNOSIS — F419 Anxiety disorder, unspecified: Secondary | ICD-10-CM

## 2021-12-04 NOTE — Addendum Note (Signed)
Addended by: Rosann Auerbach C on: 12/04/2021 10:30 AM   Modules accepted: Orders

## 2021-12-05 ENCOUNTER — Encounter: Payer: Medicare (Managed Care) | Admitting: Psychology

## 2021-12-06 ENCOUNTER — Ambulatory Visit: Payer: Medicare (Managed Care) | Admitting: Pharmacist

## 2021-12-06 ENCOUNTER — Other Ambulatory Visit: Payer: Self-pay

## 2021-12-06 DIAGNOSIS — E1142 Type 2 diabetes mellitus with diabetic polyneuropathy: Secondary | ICD-10-CM

## 2021-12-06 DIAGNOSIS — Z794 Long term (current) use of insulin: Secondary | ICD-10-CM

## 2021-12-06 NOTE — Patient Instructions (Signed)
Miss Sanford it was a pleasure seeing you today.   We will follow-up next week.   Hypoglycemia or low blood sugar:   Low blood sugar can happen quickly and may become an emergency if not treated right away.   While this shouldn't happen often, it can be brought upon if you skip a meal or do not eat enough. Also, if your insulin or other diabetes medications are dosed too high, this can cause your blood sugar to go to low.   Warning signs of low blood sugar include: Feeling shaky or dizzy Feeling weak or tired  Excessive hunger Feeling anxious or upset  Sweating even when you aren't exercising  What to do if I experience low blood sugar? Follow the Rule of 15 Check your blood sugar with your meter. If lower than 70, proceed to step 2.  Treat with 15 grams of fast acting carbs which is found in 3-4 glucose tablets. If none are available you can try hard candy, 1 tablespoon of sugar or honey,4 ounces of fruit juice, or 6 ounces of REGULAR soda.  Re-check your sugar in 15 minutes. If it is still below 70, do what you did in step 2 again. If your blood sugar has come back up, go ahead and eat a snack or small meal made up of complex carbs (ex. Whole grains) and protein at this time to avoid recurrence of low blood sugar.

## 2021-12-06 NOTE — Progress Notes (Signed)
Subjective:    Patient ID: CESAR ALF, female    DOB: 1956-04-05, 66 y.o.   MRN: [id number]  HPI Patient is a 66 y.o. female who presents for diabetes management and Dexcom follow-up. She is in tearful spirits and presents without assistance.. Patient referred on 05/18/21 and last seen by Primary Care Provider on 06/05/21. Last seen in pharmacy clinic on 08/15/21. Last seen in pharmacy clinic on 11/01/21.  Patient states she is anxious and requests to reschedule appointment and have A1C checked at a later date.   Insurance coverage/medication affordability: Company secretary Medicare, Patient Assistance Program  Current diabetes medications include: semaglutide (Ozempic) 1 mg once weekly on Thursdays, insulin degludec Tyler Aas) 44 units once daily, insulin aspart (Fiasp) 15 units TID before meals, metformin XR 750 BID,   Patient states that She is taking her medications as prescribed. Patient reports adherence with medications.  Do you feel that your medications are working for you?  yes  Have you been experiencing any side effects to the medications prescribed? no  Do you have any problems obtaining medications due to transportation or finances?  Yes; trouble getting [employer] [place] but planning to contact company today   Patient reported dietary habits:  Eats 2-3 meals/day  Breakfast - toast or oatmeal w/ eggs Lunch - pasta or chicken w/ potato and veggie Dinner- sandwich or cheese and crackers w/ hard boiled egg   Patient denies hypoglycemic events which she states occur later in evening before bedtime Patient denies increased thirst Patient denies increased urination Patient denies increased hunger. Patient reports neuropathy (nerve pain). Patient reports visual changes. Patient reports self foot exams.   Dexcom G6 patient education Patient taking >1 gram acetaminophen every 6 hours: denies Patient taking hydroxyrea: denies.  Sensor application --  Site selection and site prep with  alcohol pad Sensor prep-sensor pack and sensor applicator Sensor applied to area away from waistband, scarring, tattoos, irritation, and bones Transmitter sanitized with alcohol pad and inserted into sensor. Starting the sensor: 2 hour warm up before BG readings available Sensor change every 10 days and rotate site Call Dexcom customer service if sensor comes off before 10 days  Safety and Troubleshooting Do a fingerstick blood glucose test if the sensor readings do not match how you feel Remove sensor prior to magnetic resonance imaging (MRI), computed tomography (CT) scan, or high-frequency electrical heat (diathermy) treatment. Do not allow sun screen or insect repellant to come into contact with Dexcom G6. These skin care products may lead for the plastic used in the Dexcom G6 to crack. Dexcom G6 may be worn through a Environmental education officer. It may not be exposed to an advanced Imaging Technology (AIT) body scanner (also called a millimeter wave scanner) or the baggage x-ray machine. Instead, ask for hand-wanding or full-body pat-down and visual inspection.  Doses of acetaminophen (Tylenol) >1 gram every 6 hours may cause false high readings. Hydroxyurea (Hydrea, Droxia) may interfere with accuracy of blood glucose readings from Dexcom G6. Store sensor kit between 36 and 86 degrees Farenheit. Can be refrigerated within this temperature range.  Contact information provided for [facility] customer service and/or trainer.   Objective:     Labs:   Physical Exam Neurological:     Mental Status: She is alert and oriented to person, place, and time.    Review of Systems  Gastrointestinal:  Negative for nausea and vomiting.   Lab Results  Component Value Date   HGBA1C 8.9 (A) 07/28/2021   HGBA1C 7.9 (  A) 05/01/2021   HGBA1C 9.9 (H) 12/09/2020    Lab Results  Component Value Date   MICRALBCREAT 30-300 11/10/2020    Lipid Panel     Component Value Date/Time   CHOL 120  07/31/2019 1339   TRIG 242 (H) 07/31/2019 1339   HDL 42 (L) 07/31/2019 1339   CHOLHDL 2.9 07/31/2019 1339   VLDL 38.2 07/18/2018 0940   LDLCALC 48 07/31/2019 1339    Assessment/Plan:   T2DM is not controlled but slowly improving likely due to patient assistance with medications eliminating financial burden for patient. Was not able to make any medication changes today due to patient requesting to reschedule appointment.  Continued dose of GLP1 Ozempic to 5m once weekly. Continue Tresiba to 44 units once daily, provided patient with sample. Continue insulin aspart (Fiasp) to 15 units with all meals (TID) Continued Farxiga 173mupon receipt Extensively discussed pathophysiology of diabetes, dietary effects on blood sugar control, and recommended lifestyle interventions. Counseled on s/sx of and management of hypoglycemia Counseled on checking blood sugar with a finger stick (instead of sensor) when low after taking a glucose tablet and waiting 15 minutes Next A1C anticipated next time patient presents to clinic  Follow-up appointment 1 week to review sugar readings. Written patient instructions provided.  This appointment required 22 minutes of direct patient care.  Thank you for involving pharmacy to assist in providing this patient's care.

## 2021-12-06 NOTE — Progress Notes (Signed)
Received notification from [employer] regarding approval for FIASP, TRESIBA & OZEMPIC. Patient assistance approved from 12/06/21 to 10/18/22.  OZEMPIC SENT TO SHIPMENT PROC[facility]ING [facility].  FIASP & TRESIBA NEXT SHIPMENT PROCESSES 02/18/22  Phone: [phone]

## 2021-12-07 ENCOUNTER — Ambulatory Visit (INDEPENDENT_AMBULATORY_CARE_PROVIDER_SITE_OTHER): Payer: Medicare PPO | Admitting: Pulmonary Disease

## 2021-12-07 ENCOUNTER — Encounter: Payer: Self-pay | Admitting: Pulmonary Disease

## 2021-12-07 VITALS — BP 124/72 | HR 74 | Temp 98.4°F | Ht 61.5 in | Wt 179.0 lb

## 2021-12-07 DIAGNOSIS — G4733 Obstructive sleep apnea (adult) (pediatric): Secondary | ICD-10-CM | POA: Diagnosis not present

## 2021-12-07 NOTE — Progress Notes (Signed)
Cassandra Reid    [id number]    1956/02/15  Primary Care Physician:Lynn, Barnetta Chapel, MD  Referring Physician: [name], MD [address] [place],  [place] [address]  Chief complaint:   History of obstructive sleep apnea  HPI:  Machine became dysfunctional about a year ago Was tolerating CPAP well  Study was performed many years ago she cannot remember exactly-about 2007 She cannot remember how severe sleep apnea was but she was started on CPAP and has been using it regularly. Machine became dysfunctional and had a lot of other things going on was why she waited till now to seek getting a new machine  She is having some issues with tachycardia recently  Usually tries to go to bed between 10 and 11 Takes about 5 minutes to fall asleep 4-5 awakenings  Final wake up time between 6 and 7 AM  Weight is down about 20 pounds recently  Reformed smoker  She does have hypertension, diabetes, history of stroke, history of chronic kidney disease  Does not recollect whether her parents snored   Outpatient Encounter Medications as of 12/07/2021  Medication Sig   amLODipine (NORVASC) 10 MG tablet Take 10 mg by mouth daily.   atorvastatin (LIPITOR) 40 MG tablet TAKE 1 TABLET BY MOUTH EVERYDAY AT BEDTIME   BD PEN NEEDLE NANO 2ND GEN 32G X 4 MM MISC USE WITH INSULIN   dapagliflozin propanediol (FARXIGA) 10 MG TABS tablet Take 1 tablet (10 mg total) by mouth daily before breakfast. This is a patient assistance medication. Patient may not be approved and/or have medication. Please ask and verify when performing med review.   dicyclomine (BENTYL) 20 MG tablet Take 1 tablet (20 mg total) by mouth 2 (two) times daily as needed for spasms.   escitalopram (LEXAPRO) 5 MG tablet TAKE 2 TABLETS BY MOUTH EVERY DAY   fluticasone (FLONASE) 50 MCG/ACT nasal spray Place 1 spray into both nostrils daily as needed for allergies or rhinitis.   furosemide (LASIX) 40 MG tablet Take  1 tablet (40 mg total) by mouth daily.   gabapentin (NEURONTIN) 600 MG tablet TAKE 0.5-1 TABLET (300-600 MG TOTAL) BY MOUTH 3 (THREE) TIMES DAILY.   insulin aspart (FIASP) 100 UNIT/ML FlexTouch Pen Inject 15 Units into the skin in the morning, at noon, and at bedtime. This is a patient assistance medication. Patient may not be approved and/or have medication. Please ask and verify when performing med review.   insulin degludec (TRESIBA) 100 UNIT/ML FlexTouch Pen Inject 44 Units into the skin daily. This is a patient assistance medication. Patient may not be approved and/or have medication. Please ask and verify when performing med review.   KLOR-CON M20 20 MEQ tablet TAKE 1 TABLET BY MOUTH TWICE A DAY   Lancet Devices (ONE TOUCH DELICA LANCING DEV) MISC Please use to check blood sugar up to 4 times daily. E11.42   Lancets Misc. (ACCU-CHEK SOFTCLIX LANCET DEV) KIT Use as instructed to check blood sugar 4 times daily. Dx E11.42   lisinopril (ZESTRIL) 10 MG tablet Take 10 mg by mouth daily.   loratadine (CLARITIN) 10 MG tablet Take 1 tablet (10 mg total) by mouth daily.   LORazepam (ATIVAN) 0.5 MG tablet TAKE 1 TABLET BY MOUTH EVERY DAY AS NEEDED FOR ANXIETY   magnesium oxide (MAG-OX) 400 (241.3 Mg) MG tablet Take 1 tablet (400 mg total) by mouth 2 (two) times daily.   metFORMIN (GLUCOPHAGE-XR) 750 MG 24 hr  tablet Take 1 tablet (750 mg total) by mouth 2 (two) times daily with a meal.   Multiple Vitamin (MULTIVITAMIN PO) Take 1 tablet by mouth daily.   ondansetron (ZOFRAN) 4 MG tablet Take 1 tablet (4 mg total) by mouth every 6 (six) hours.   OneTouch Delica Lancets 89Q MISC Please use to check blood sugar up to 4 times daily. E11.42   pantoprazole (PROTONIX) 40 MG tablet Take 40 mg by mouth daily.   Semaglutide,0.25 or 0.5MG /DOS, (OZEMPIC, 0.25 OR 0.5 MG/DOSE,) 2 MG/1.5ML SOPN Inject 0.5 mg into the skin once a week. (Patient taking differently: Inject 0.25 mg into the skin once a week.)   sucralfate  (CARAFATE) 1 g tablet TAKE 1 TABLET (1 G TOTAL) BY MOUTH 4 (FOUR) TIMES DAILY - WITH MEALS AND AT BEDTIME.   [DISCONTINUED] baclofen (LIORESAL) 10 MG tablet TAKE 1 TABLET BY MOUTH TWICE A DAY AS NEEDED FOR MUSCLE SPASM   [DISCONTINUED] baclofen (LIORESAL) 10 MG tablet Take by mouth.   [DISCONTINUED] lisinopril (ZESTRIL) 5 MG tablet Take 1 tablet (5 mg total) by mouth 2 (two) times daily.   famotidine (PEPCID) 20 MG tablet Take 1 tablet (20 mg total) by mouth 2 (two) times daily.   traZODone (DESYREL) 50 MG tablet Take 0.5 tablets (25 mg total) by mouth at bedtime as needed for sleep.   [DISCONTINUED] Ascorbic Acid (VITAMIN C PO) Take 1 tablet by mouth daily. (Patient not taking: Reported on 07/28/2021)   [DISCONTINUED] metoprolol tartrate (LOPRESSOR) 25 MG tablet Take 12.5 mg by mouth 2 (two) times daily.   [DISCONTINUED] omeprazole (PRILOSEC) 40 MG capsule Take 40 mg by mouth 2 (two) times daily.   No facility-administered encounter medications on file as of 12/07/2021.    Allergies as of 12/07/2021   (No Known Allergies)    Past Medical History:  Diagnosis Date   Allergic rhinitis 09/26/2018   Anosmia    Arthritis    Asthma    Balance problems 09/22/2021   C. difficile colitis 07/18/2018   Cerebral aneurysm without rupture    Chest pain 03/26/2020   Chronic pain syndrome 04/23/2019   CKD stage 3 due to type 2 diabetes mellitus 04/15/2019   DDD (degenerative disc disease), cervical 04/16/2019   Encephalomalacia on imaging study 08/25/2020   Brain MRI 08/25/21: Few scattered areas of encephalomalacia and gliosis within the overlying left frontal lobe most likely postoperative in nature.   Fatty liver 05/31/2020   Fibromyalgia    Former smoker    Generalized anxiety disorder    GERD (gastroesophageal reflux disease)    Heart murmur    History of chicken pox    History of colon polyps    Hyperlipidemia associated with type 2 diabetes mellitus 07/18/2018   Hypertension associated  with diabetes 07/18/2018   IBS (irritable bowel syndrome)    Incontinence 09/22/2021   Bowel and bladder.    Ischemic brain injury 05/29/2021   Lacunar infarction 05/25/2021   Brain MRI 08/25/21: Few small remote lacunar infarcts noted at the right caudate and left internal capsule   Legally blind    Major depressive disorder    Migraines    Myalgia 04/23/2019   OSA (obstructive sleep apnea) 07/18/2018   not using CPAP machine (broken)   Osteoporosis 01/18/2021   Paroxysmal atrial tachycardia    On telemetry   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   Type 2 diabetes mellitus  with diabetic polyneuropathy, with long-term current use of insulin 07/18/2018    Past Surgical History:  Procedure Laterality Date   ABDOMINAL HYSTERECTOMY  2012   APPENDECTOMY  1972   CEREBRAL ANEURYSM REPAIR Left 2004   Aneurysm clip in place near the level of the left ICA on Brain MRI 08/2020. Also coiling performed   CHOLECYSTECTOMY  2015   CRANIOTOMY Left    left pterional craniotomy   MENISCUS REPAIR Bilateral     Family History  Problem Relation Age of Onset   Alcohol abuse Mother    Diabetes Mother    Hypertension Mother    Kidney disease Mother    COPD Father    Diabetes Father    Early death Father    Heart disease Father    Hyperlipidemia Father    Hypertension Father    Diabetes Sister    Hypertension Sister    Breast cancer Sister    Cancer Maternal Grandmother    Alcohol abuse Maternal Grandfather    Cancer Maternal Grandfather    Cancer Paternal Grandmother    Cancer Paternal Grandfather    Diabetes Sister    Hypertension Sister    Kidney disease Sister     Social History   Socioeconomic History   Marital status: Legally Separated    Spouse name: John   Number of children: 3   Years of education: 29   Highest education level: Associate degree: academic program  Occupational History    Occupation: disabled  Tobacco Use   Smoking status: Former    Packs/day: 1.00    Years: 3.00    Pack years: 3.00    Types: Cigarettes    Quit date: 06/1967    Years since quitting: 54.5   Smokeless tobacco: Never  Vaping Use   Vaping Use: Never used  Substance and Sexual Activity   Alcohol use: Never   Drug use: Never   Sexual activity: Not Currently  Other Topics Concern   Not on file  Social History Narrative   Patient is right-handed. She lives with her husband in a 2 story house. She walks the dog daily for exercise. She drinks I cup of coffee and 3 glasses of unsweet tea.   Social Determinants of Health   Financial Resource Strain: Low Risk    Difficulty of Paying Living Expenses: Not very hard  Food Insecurity: No Food Insecurity   Worried About Charity fundraiser in the Last Year: Never true   Ran Out of Food in the Last Year: Never true  Transportation Needs: No Transportation Needs   Lack of Transportation (Medical): No   Lack of Transportation (Non-Medical): No  Physical Activity: Not on file  Stress: Stress Concern Present   Feeling of Stress : Very much  Social Connections: Not on file  Intimate Partner Violence: Not on file    Review of Systems  Constitutional:  Positive for fatigue.  Psychiatric/Behavioral:  Positive for sleep disturbance.    Vitals:   12/07/21 1505  BP: 124/72  Pulse: 74  Temp: 98.4 F (36.9 C)  SpO2: 96%     Physical Exam Constitutional:      Appearance: She is obese.  HENT:     Head: Normocephalic.     Mouth/Throat:     Mouth: Mucous membranes are moist.     Comments: Mallampati 4, crowded oropharynx Eyes:     Pupils: Pupils are equal, round, and reactive to light.  Cardiovascular:     Rate and  Rhythm: Normal rate and regular rhythm.     Heart sounds: No murmur heard.   No friction rub.  Pulmonary:     Effort: No respiratory distress.     Breath sounds: No stridor. No wheezing or rhonchi.  Musculoskeletal:      Cervical back: No rigidity.  Neurological:     Mental Status: She is alert.  Psychiatric:        Mood and Affect: Mood normal.   Results of the Epworth flowsheet 12/07/2021  Sitting and reading 0  Watching TV 1  Sitting, inactive in a public place (e.g. a theatre or a meeting) 0  As a passenger in a car for an hour without a break 0  Lying down to rest in the afternoon when circumstances permit 1  Sitting and talking to someone 0  Sitting quietly after a lunch without alcohol 0  In a car, while stopped for a few minutes in traffic 0  Total score 2     Data Reviewed: Previous sleep study not available  Assessment:  History of obstructive sleep apnea with a recently dysfunctional machine  History of paroxysmal atrial tachycardia  Pathophysiology of sleep disordered breathing discussed with the patient Treatment options discussed with the patient  Patient tolerated CPAP well without significant issues in the past  Plan/Recommendations: Schedule patient for a home sleep study  CPAP will be course of treatment if she still has significant sleep disordered breathing  Encouraged to continue working on weight loss efforts  Tentative follow-up in 4 to 6 months   Sherrilyn Rist MD [facility] 12/07/2021, 3:21 PM  CC: [name], MD

## 2021-12-07 NOTE — Patient Instructions (Signed)
IsHistory of obstructive sleep apnea  We will schedule you for home sleep study we will contact you with results as soon as reviewed  We will contact the medical supply company to set you up with a CPAP following that   Tentatively we will see you back in about 4 to 6 months, depending on when the study is done and Treatment  Call us with significant concerns  Continue with weight loss efforts

## 2021-12-11 ENCOUNTER — Other Ambulatory Visit: Payer: Self-pay | Admitting: Family Medicine

## 2021-12-11 ENCOUNTER — Telehealth (INDEPENDENT_AMBULATORY_CARE_PROVIDER_SITE_OTHER): Payer: Medicare PPO | Admitting: Pharmacist

## 2021-12-11 ENCOUNTER — Other Ambulatory Visit: Payer: Self-pay

## 2021-12-11 DIAGNOSIS — M62838 Other muscle spasm: Secondary | ICD-10-CM

## 2021-12-11 DIAGNOSIS — Z794 Long term (current) use of insulin: Secondary | ICD-10-CM | POA: Diagnosis not present

## 2021-12-11 DIAGNOSIS — G894 Chronic pain syndrome: Secondary | ICD-10-CM

## 2021-12-11 DIAGNOSIS — M797 Fibromyalgia: Secondary | ICD-10-CM

## 2021-12-11 DIAGNOSIS — E1142 Type 2 diabetes mellitus with diabetic polyneuropathy: Secondary | ICD-10-CM

## 2021-12-11 NOTE — Progress Notes (Signed)
Subjective:    Patient ID: Cassandra Reid, female    DOB: 1956-08-15, 66 y.o.   MRN: [id number]  HPI Patient is a 66 y.o. female who presents for diabetes management and Dexcom follow-up. She is in good spirits and presents via video visit. Patient referred on 05/18/21 and last seen by Primary Care Provider on 06/05/21. Last seen in pharmacy clinic on 08/15/21. Last seen in pharmacy clinic on 12/06/21 in which we rescheduled appointment to today due to patient's request.  I connected with  Cassandra Reid on 12/13/21 by a video enabled telemedicine application and verified that I am speaking with the correct person using two identifiers.   I discussed the limitations of evaluation and management by telemedicine. The patient expressed understanding and agreed to proceed. Patient located at home and provider located in office.  Patient states she has been struggling with her mental health lately and due to this her blood glucose has increased slightly. She states she has continued to take her medications but is struggling with diet and has been eating "whatever I can find in my fridge. I just don't feel like cooking."  Insurance coverage/medication affordability: Company secretary Medicare, Patient Assistance Program  Current diabetes medications include: semaglutide (Ozempic) 1 mg once weekly on Thursdays (completed 3 doses), insulin degludec Tyler Aas) 44 units once daily, insulin aspart (Fiasp) 15 units TID before meals, metformin XR 750 BID, Farxiga $RemoveBefo'10mg'pqQKkkaHsTX$    Patient states that She is taking her medications as prescribed. Patient reports adherence with medications.  Do you feel that your medications are working for you?  yes  Have you been experiencing any side effects to the medications prescribed? no  Do you have any problems obtaining medications due to transportation or finances?  no    Patient denies hypoglycemic events which she states occur later in evening before bedtime Patient denies increased  thirst Patient denies increased urination Patient denies increased hunger. Patient reports neuropathy (nerve pain). Patient reports visual changes. Patient reports self foot exams.   Dexcom G6 patient education Patient taking >1 gram acetaminophen every 6 hours: denies Patient taking hydroxyrea: denies.  Safety and Troubleshooting Do a fingerstick blood glucose test if the sensor readings do not match how you feel Remove sensor prior to magnetic resonance imaging (MRI), computed tomography (CT) scan, or high-frequency electrical heat (diathermy) treatment. Do not allow sun screen or insect repellant to come into contact with Dexcom G6. These skin care products may lead for the plastic used in the Dexcom G6 to crack. Dexcom G6 may be worn through a Environmental education officer. It may not be exposed to an advanced Imaging Technology (AIT) body scanner (also called a millimeter wave scanner) or the baggage x-ray machine. Instead, ask for hand-wanding or full-body pat-down and visual inspection.  Doses of acetaminophen (Tylenol) >1 gram every 6 hours may cause false high readings. Hydroxyurea (Hydrea, Droxia) may interfere with accuracy of blood glucose readings from Dexcom G6. Store sensor kit between 36 and 86 degrees Farenheit. Can be refrigerated within this temperature range.  Contact information provided for [facility] customer service and/or trainer.   Objective:     Labs:   Physical Exam Neurological:     Mental Status: She is alert and oriented to person, place, and time.    Review of Systems  Gastrointestinal:  Negative for nausea and vomiting.   Lab Results  Component Value Date   HGBA1C 8.9 (A) 07/28/2021   HGBA1C 7.9 (A) 05/01/2021   HGBA1C 9.9 (H) 12/09/2020  Lab Results  Component Value Date   MICRALBCREAT 30-300 11/10/2020    Lipid Panel     Component Value Date/Time   CHOL 120 07/31/2019 1339   TRIG 242 (H) 07/31/2019 1339   HDL 42 (L) 07/31/2019 1339    CHOLHDL 2.9 07/31/2019 1339   VLDL 38.2 07/18/2018 0940   LDLCALC 48 07/31/2019 1339    Assessment/Plan:   T2DM is not controlled but slowly improving likely due to patient assistance with medications eliminating financial burden for patient. Due to patient continuing to experience hyperglycemia throughout the day will increase insulin dosing. Will also increase Ozempic to $RemoveBe'2mg'WsHKXBJQc$  once weekly once patient completes 4 doses of $Remove'1mg'tglqeLs$ . Following instruction patient verbalized understanding of treatment plan.   Continued dose of GLP1 Ozempic to $RemoveBe'1mg'NqKvDhvKX$  once weekly and will titrate to $RemoveBe'2mg'DLviaaQnj$  once weekly Increase dose of Tresiba to 48 units once daily Increased dose of insulin aspart (Fiasp) to 16 units with all meals (TID) Continued Farxiga $RemoveBeforeDEI'10mg'yiTlDcIbGlSGFCuL$  Continued metformin XR 750 twice daily Extensively discussed pathophysiology of diabetes, dietary effects on blood sugar control, and recommended lifestyle interventions. Counseled on s/sx of and management of hypoglycemia Next A1C anticipated next time patient presents to clinic  Follow-up appointment 2 week to review sugar readings. Written patient instructions provided.  This appointment required 22 minutes of direct patient care.  Thank you for involving pharmacy to assist in providing this patient's care.

## 2021-12-12 ENCOUNTER — Encounter: Payer: Self-pay | Admitting: Family Medicine

## 2021-12-12 ENCOUNTER — Other Ambulatory Visit: Payer: Self-pay | Admitting: Family Medicine

## 2021-12-12 DIAGNOSIS — F419 Anxiety disorder, unspecified: Secondary | ICD-10-CM

## 2021-12-12 MED ORDER — AMLODIPINE BESYLATE 10 MG PO TABS
10.0000 mg | ORAL_TABLET | Freq: Every day | ORAL | 3 refills | Status: DC
Start: 2021-12-12 — End: 2022-01-18

## 2021-12-12 MED ORDER — LORAZEPAM 0.5 MG PO TABS: 0.5000 mg | ORAL_TABLET | Freq: Three times a day (TID) | ORAL | 0 refills | Status: DC | PRN

## 2021-12-12 MED ORDER — LISINOPRIL 10 MG PO TABS: 10.0000 mg | ORAL_TABLET | Freq: Every day | ORAL | 3 refills | Status: DC

## 2021-12-12 MED ORDER — PANTOPRAZOLE SODIUM 40 MG PO TBEC: 40.0000 mg | DELAYED_RELEASE_TABLET | Freq: Every day | ORAL | 2 refills | Status: DC

## 2021-12-13 NOTE — Assessment & Plan Note (Signed)
T2DM is not controlled but slowly improving likely due to patient assistance with medications eliminating financial burden for patient. Due to patient continuing to experience hyperglycemia throughout the day will increase insulin dosing. Will also increase Ozempic to 2mg  once weekly once patient completes 4 doses of 1mg . Following instruction patient verbalized understanding of treatment plan.   1. Continued dose of GLP1 Ozempic to 1mg  once weekly and will titrate to 2mg  once weekly 2. Increase dose of Tresiba to 48 units once daily 3. Increased dose of insulin aspart (Fiasp) to 16 units with all meals (TID) 4. Continued Farxiga 10mg  5. Continued metformin XR 750 twice daily 6. Extensively discussed pathophysiology of diabetes, dietary effects on blood sugar control, and recommended lifestyle interventions. 7. Counseled on s/sx of and management of hypoglycemia 8. Next A1C anticipated next time patient presents to clinic  Follow-up appointment 2 week to review sugar readings.

## 2021-12-15 ENCOUNTER — Encounter: Payer: Self-pay | Admitting: Family Medicine

## 2021-12-17 ENCOUNTER — Other Ambulatory Visit: Payer: Self-pay | Admitting: Family Medicine

## 2021-12-17 DIAGNOSIS — K219 Gastro-esophageal reflux disease without esophagitis: Secondary | ICD-10-CM

## 2021-12-19 DIAGNOSIS — G894 Chronic pain syndrome: Secondary | ICD-10-CM | POA: Diagnosis not present

## 2021-12-19 DIAGNOSIS — G4486 Cervicogenic headache: Secondary | ICD-10-CM | POA: Diagnosis not present

## 2021-12-19 DIAGNOSIS — Z79891 Long term (current) use of opiate analgesic: Secondary | ICD-10-CM | POA: Diagnosis not present

## 2021-12-19 DIAGNOSIS — M542 Cervicalgia: Secondary | ICD-10-CM | POA: Diagnosis not present

## 2021-12-19 DIAGNOSIS — M47812 Spondylosis without myelopathy or radiculopathy, cervical region: Secondary | ICD-10-CM | POA: Diagnosis not present

## 2021-12-19 DIAGNOSIS — M25511 Pain in right shoulder: Secondary | ICD-10-CM | POA: Diagnosis not present

## 2021-12-20 ENCOUNTER — Ambulatory Visit: Payer: Medicare PPO | Admitting: Pharmacist

## 2021-12-21 ENCOUNTER — Encounter: Payer: Self-pay | Admitting: Pharmacist

## 2021-12-25 ENCOUNTER — Telehealth: Payer: Self-pay

## 2021-12-25 ENCOUNTER — Other Ambulatory Visit: Payer: Self-pay | Admitting: Family Medicine

## 2021-12-25 DIAGNOSIS — M797 Fibromyalgia: Secondary | ICD-10-CM

## 2021-12-25 DIAGNOSIS — M62838 Other muscle spasm: Secondary | ICD-10-CM

## 2021-12-25 DIAGNOSIS — G894 Chronic pain syndrome: Secondary | ICD-10-CM

## 2021-12-25 NOTE — Telephone Encounter (Signed)
Patient calls nurse line requesting Free Style Libre sent to [facility]. Patient reports with insurance change Dexcom is not covered through [employer] anymore. Patient reports she spoke with insurance representative and Free Style has to go through [facility].   Please place order supplies and I will fax to [facility] with chart notes.   [phone]

## 2021-12-25 NOTE — Telephone Encounter (Addendum)
Done. Submitted via [employer] to [facility]

## 2021-12-27 ENCOUNTER — Other Ambulatory Visit: Payer: Self-pay

## 2021-12-27 ENCOUNTER — Ambulatory Visit (INDEPENDENT_AMBULATORY_CARE_PROVIDER_SITE_OTHER): Payer: Medicare PPO | Admitting: Pharmacist

## 2021-12-27 DIAGNOSIS — E1142 Type 2 diabetes mellitus with diabetic polyneuropathy: Secondary | ICD-10-CM

## 2021-12-27 DIAGNOSIS — Z794 Long term (current) use of insulin: Secondary | ICD-10-CM | POA: Diagnosis not present

## 2021-12-27 LAB — POCT GLYCOSYLATED HEMOGLOBIN (HGB A1C): HbA1c, POC (controlled diabetic range): 7.7 % — AB (ref 0.0–7.0)

## 2021-12-27 MED ORDER — MOUNJARO 2.5 MG/0.5ML ~~LOC~~ SOAJ: 5.0000 mg | SUBCUTANEOUS | 1 refills | Status: DC

## 2021-12-27 NOTE — Assessment & Plan Note (Signed)
T2DM is not controlled but has shown improvement likely due to patient assistance with medications eliminating financial burden for patient. There has been issues with delays in patient receiving patient assistance medication therefore likely leading to cause of patient's A1C to be slightly above goal. Once this is resolved patient should see continued improvement in blood glucose. Will continue patient's current insulin regimen as she has experienced improvement in readings. Patient due for Ozempic titration to 2mg  this week but still has not received patient assistance delivery. We do not have samples in the clinic therefore transitioned patient to [facility] 2.5mg  x2 doses once weekly in the meantime. Following instruction patient verbalized understanding of treatment plan.   1. Hold Ozempic and start Mounjaro 2.5mg  x2 doses (5mg  total) once weekly in place of Ozempic for time being 2. Continued dose of Tresiba 48 units once daily 3. Continued dose of insulin aspart (Fiasp) 18 units with all meals (TID) 4. Continued Farxiga 10mg  5. Continued metformin XR 750 twice daily 6. Extensively discussed pathophysiology of diabetes, dietary effects on blood sugar control, and recommended lifestyle interventions. 7. Counseled on s/sx of and management of hypoglycemia 8. Next A1C anticipated in May 2023  Follow-up appointment 4 weeks to review sugar readings.

## 2021-12-27 NOTE — Patient Instructions (Addendum)
Miss Boch it was a pleasure seeing you today.   Please do the following:   as directed today during your appointment. If you have any questions or if you believe something has occurred because of this change, call me or your doctor to let one of [place] know.  Continue checking blood sugars at home. It's really important that you record these and bring these in to your next doctor's appointment.  Continue making the lifestyle changes we've discussed together during our visit. Diet and exercise play a significant role in improving your blood sugars.  Follow-up with Dr. Larita Fife next month   Hypoglycemia or low blood sugar:   Low blood sugar can happen quickly and may become an emergency if not treated right away.   While this shouldn't happen often, it can be brought upon if you skip a meal or do not eat enough. Also, if your insulin or other diabetes medications are dosed too high, this can cause your blood sugar to go to low.   Warning signs of low blood sugar include: Feeling shaky or dizzy Feeling weak or tired  Excessive hunger Feeling anxious or upset  Sweating even when you aren't exercising  What to do if I experience low blood sugar? Follow the Rule of 15 Check your blood sugar with your meter. If lower than 70, proceed to step 2.  Treat with 15 grams of fast acting carbs which is found in 3-4 glucose tablets. If none are available you can try hard candy, 1 tablespoon of sugar or honey,4 ounces of fruit juice, or 6 ounces of REGULAR soda.  Re-check your sugar in 15 minutes. If it is still below 70, do what you did in step 2 again. If your blood sugar has come back up, go ahead and eat a snack or small meal made up of complex carbs (ex. Whole grains) and protein at this time to avoid recurrence of low blood sugar.

## 2021-12-27 NOTE — Progress Notes (Signed)
Subjective:    Patient ID: Cassandra Reid, female    DOB: [birth year], 66 y.o.   MRN: [id number]  HPI Patient is a 66 y.o. female who presents for diabetes management and Dexcom follow-up. She is in good spirits and presents without assistance. Patient referred on 05/18/21 and last seen by Primary Care Provider on 06/05/21. Last seen in pharmacy clinic on 12/11/21.  Patient states she has been without a CGM sensor for a little while due to switching from DME suppliers as well as switching from Dexcom back to [place] due to insurance coverage. She states this has added stress onto her as she feels the need to check more frequently. She has continued to take her medications but is still struggling with diet and has not been cooking. She reports despite this she has noticed an improvement in her blood glucose readings since our last visit where we increased her insulin regimen slightly. Patient still awaiting Novonordisk patient assistance medication delivery and is running out her medications (Ozempic, Pauline Good)  Insurance coverage/medication affordability: [employer] Medicare, Patient Assistance Program  Current diabetes medications include: semaglutide (Ozempic) 1 mg once weekly on Thursdays (completed 4 doses), insulin degludec Evaristo Bury) 48 units once daily, insulin aspart (Fiasp) 18 units TID before meals, metformin XR 750 BID, Farxiga 10mg    Patient states that She is taking her medications as prescribed. Patient reports adherence with medications.  Do you feel that your medications are working for you?  yes  Have you been experiencing any side effects to the medications prescribed? no  Do you have any problems obtaining medications due to transportation or finances?  Yes; current delays with Novo patient assistance    Patient denies hypoglycemic events which she states occur later in evening before bedtime Patient denies increased thirst Patient denies increased urination Patient denies  increased hunger. Patient reports neuropathy (nerve pain). Patient denies visual changes. Patient reports self foot exams.   Patient reported blood glucose readings: Fasting: 150 Post-prandial: high 100's; highest 210  Objective:   Labs:   Physical Exam Neurological:     Mental Status: She is alert and oriented to person, place, and time.    Review of Systems  Gastrointestinal:  Negative for nausea and vomiting.   Lab Results  Component Value Date   HGBA1C 7.7 (A) 12/27/2021   HGBA1C 8.9 (A) 07/28/2021   HGBA1C 7.9 (A) 05/01/2021    Lab Results  Component Value Date   MICRALBCREAT 30-300 11/10/2020    Lipid Panel     Component Value Date/Time   CHOL 120 07/31/2019 1339   TRIG 242 (H) 07/31/2019 1339   HDL 42 (L) 07/31/2019 1339   CHOLHDL 2.9 07/31/2019 1339   VLDL 38.2 07/18/2018 0940   LDLCALC 48 07/31/2019 1339    Assessment/Plan:   T2DM is not controlled but has shown improvement likely due to patient assistance with medications eliminating financial burden for patient. There has been issues with delays in patient receiving patient assistance medication therefore likely leading to cause of patient's A1C to be slightly above goal. Once this is resolved patient should see continued improvement in blood glucose. Will continue patient's current insulin regimen as she has experienced improvement in readings. Patient due for Ozempic titration to 2mg  this week but still has not received patient assistance delivery. We do not have samples in the clinic therefore transitioned patient to [facility] 2.5mg  x2 doses once weekly in the meantime. Following instruction patient verbalized understanding of treatment plan.   Hold Ozempic  and start Mounjaro 2.5mg  x2 doses (5mg  total) once weekly in place of Ozempic for time being Continued dose of Tresiba 48 units once daily Continued dose of insulin aspart (Fiasp) 18 units with all meals (TID) Continued Farxiga 10mg  Continued  metformin XR 750 twice daily Extensively discussed pathophysiology of diabetes, dietary effects on blood sugar control, and recommended lifestyle interventions. Counseled on s/sx of and management of hypoglycemia Next A1C anticipated in May 2023  Follow-up appointment 4 weeks to review sugar readings. Written patient instructions provided.  This appointment required 35 minutes of direct patient care.  Thank you for involving pharmacy to assist in providing this patient's care.  Medication Samples have been provided to the patient.  Drug name:       Strength: 2.5mg         Qty: 8 pens  LOT: June 2023 D  Exp.Date: 03/30/23   Drug name: Fiasp (insulin aspart)     Strength: 100 units/mL        Qty: 2 vials LOT: [id number]  Exp.Date:12/19/22   Drug name: [id number] (insulin degludec)    Strength: 200 units/mL       Qty: 2 pens LOT: 12/21/22  Exp.Date:05/18/22  The patient has been instructed regarding the correct time, dose, and frequency of taking this medication, including desired effects and most common side effects.   [id number] 11:18 AM 12/27/2021

## 2022-01-01 ENCOUNTER — Encounter: Payer: Self-pay | Admitting: Family Medicine

## 2022-01-01 DIAGNOSIS — E1165 Type 2 diabetes mellitus with hyperglycemia: Secondary | ICD-10-CM | POA: Diagnosis not present

## 2022-01-01 MED ORDER — ONETOUCH VERIO VI STRP: ORAL_STRIP | 12 refills | Status: DC

## 2022-01-02 ENCOUNTER — Encounter: Payer: Self-pay | Admitting: Neurology

## 2022-01-02 ENCOUNTER — Encounter: Payer: Self-pay | Admitting: Family Medicine

## 2022-01-02 DIAGNOSIS — H5213 Myopia, bilateral: Secondary | ICD-10-CM | POA: Diagnosis not present

## 2022-01-02 DIAGNOSIS — Z135 Encounter for screening for eye and ear disorders: Secondary | ICD-10-CM | POA: Diagnosis not present

## 2022-01-02 DIAGNOSIS — E1142 Type 2 diabetes mellitus with diabetic polyneuropathy: Secondary | ICD-10-CM

## 2022-01-02 DIAGNOSIS — Z794 Long term (current) use of insulin: Secondary | ICD-10-CM

## 2022-01-02 DIAGNOSIS — E119 Type 2 diabetes mellitus without complications: Secondary | ICD-10-CM | POA: Diagnosis not present

## 2022-01-02 DIAGNOSIS — H524 Presbyopia: Secondary | ICD-10-CM | POA: Diagnosis not present

## 2022-01-02 DIAGNOSIS — H52223 Regular astigmatism, bilateral: Secondary | ICD-10-CM | POA: Diagnosis not present

## 2022-01-02 MED ORDER — GLUCOSE BLOOD VI STRP: ORAL_STRIP | 12 refills | Status: DC

## 2022-01-03 ENCOUNTER — Other Ambulatory Visit: Payer: Self-pay

## 2022-01-03 ENCOUNTER — Ambulatory Visit: Payer: Medicare PPO

## 2022-01-03 DIAGNOSIS — G4733 Obstructive sleep apnea (adult) (pediatric): Secondary | ICD-10-CM | POA: Diagnosis not present

## 2022-01-04 ENCOUNTER — Encounter: Payer: Self-pay | Admitting: Pharmacist

## 2022-01-04 DIAGNOSIS — H25813 Combined forms of age-related cataract, bilateral: Secondary | ICD-10-CM | POA: Diagnosis not present

## 2022-01-04 DIAGNOSIS — I671 Cerebral aneurysm, nonruptured: Secondary | ICD-10-CM | POA: Diagnosis not present

## 2022-01-06 NOTE — Progress Notes (Signed)
SUBJECTIVE:   CHIEF COMPLAINT / HPI:   Diabetes follow up - improved A1c, last check 7.7 - working with Hughes Better, expecting to pick up insulin from clinic - tolerating meds well  Health Maintenance - asks for referral to new pain doctor, believes the one we referred her to earlier will not be a therapeutic relationship - needs new referral to vestibular rehab due to insurance changing - UTD on COVID, received bivalent booster at [facility] - due for Shingles - has [place] appt 3/20 - next cardiology appt 6/30 - annual neuro appt 7/10 - working with ophthalmology to schedule bilateral cataracts surgery - did sleep study, awaiting results  PERTINENT  PMH / PSH:  Patient Active Problem List   Diagnosis Date Noted   Paroxysmal atrial tachycardia 11/24/2021   Cerebral aneurysm without rupture    Major depressive disorder    Generalized anxiety disorder    Balance problems 09/22/2021   Incontinence 09/22/2021   Obesity (BMI 30.0-34.9) 05/25/2021   Lacunar infarction 05/25/2021   Anosmia    Osteoporosis 01/18/2021   Encephalomalacia on imaging study 08/25/2020   Fatty liver 05/31/2020   Chronic pain syndrome 04/23/2019   DDD (degenerative disc disease), cervical 04/16/2019   CKD stage 3 due to type 2 diabetes mellitus 04/15/2019   Hypertension associated with diabetes 07/18/2018   GERD (gastroesophageal reflux disease) 07/18/2018   OSA (obstructive sleep apnea) 07/18/2018   Type 2 diabetes mellitus with diabetic polyneuropathy, with long-term current use of insulin 07/18/2018   Hyperlipidemia associated with type 2 diabetes mellitus 07/18/2018   Fibromyalgia    Legally blind 09/01/2003     OBJECTIVE:   BP (!) 119/57    Pulse 77    Ht 5\' 1"  (1.549 m)    Wt 176 lb 4 oz (79.9 kg)    BMI 33.30 kg/m    PHQ-9:  Depression screen [facility] 2/9 01/08/2022 09/18/2021 07/28/2021  Decreased Interest 3 0 2  Down, Depressed, Hopeless 3 0 3  PHQ - 2 Score 6 0 5  Altered sleeping 1 0 2   Tired, decreased energy 1 0 1  Change in appetite 1 0 0  Feeling bad or failure about yourself  3 0 2  Trouble concentrating 1 0 0  Moving slowly or fidgety/restless 0 0 0  Suicidal thoughts 0 0 0  PHQ-9 Score 13 0 10  Difficult doing work/chores - - Very difficult  Some recent data might be hidden    GAD-7:  GAD 7 : Generalized Anxiety Score 06/05/2021 09/08/2020 08/11/2020 05/26/2020  Nervous, Anxious, on Edge 3 3 3 3   Control/stop worrying 3 3 3 3   Worry too much - different things 3 3 3 3   Trouble relaxing 3 3 3 3   Restless 0 3 - 1  Easily annoyed or irritable 2 0 0 1  Afraid - awful might happen 3 3 3 1   Total GAD 7 Score 17 18 - 15  Anxiety Difficulty - Very difficult - Somewhat difficult    Physical Exam General: Awake, alert, oriented Cardiovascular: Regular rate and rhythm, S1 and S2 present, no murmurs auscultated Respiratory: Lung fields clear to auscultation bilaterally Extremities: No bilateral lower extremity edema, palpable pedal and pretibial pulses bilaterally Neuro: Cranial nerves II through X grossly intact, able to move all extremities spontaneously  ASSESSMENT/PLAN:   Type 2 diabetes mellitus with diabetic polyneuropathy, with long-term current use of insulin Improving thanks to close follow up with Dr. Hughes Better. Tolerating meds well without  adverse side effects. Planning to pick up insulin from clinic today.   Chronic pain syndrome New referral to different pain clinic. Previous clinic not a therapeutic relationship.   Balance problems New referral needed to vestibular rehab for insurance purposes. Sent today.     Ezequiel Essex, MD [place]

## 2022-01-08 ENCOUNTER — Ambulatory Visit (INDEPENDENT_AMBULATORY_CARE_PROVIDER_SITE_OTHER): Payer: Medicare PPO | Admitting: Family Medicine

## 2022-01-08 ENCOUNTER — Other Ambulatory Visit: Payer: Self-pay

## 2022-01-08 ENCOUNTER — Encounter: Payer: Self-pay | Admitting: Family Medicine

## 2022-01-08 VITALS — BP 119/57 | HR 77 | Ht 61.0 in | Wt 176.2 lb

## 2022-01-08 DIAGNOSIS — E1142 Type 2 diabetes mellitus with diabetic polyneuropathy: Secondary | ICD-10-CM

## 2022-01-08 DIAGNOSIS — G894 Chronic pain syndrome: Secondary | ICD-10-CM

## 2022-01-08 DIAGNOSIS — Z794 Long term (current) use of insulin: Secondary | ICD-10-CM | POA: Diagnosis not present

## 2022-01-08 DIAGNOSIS — M797 Fibromyalgia: Secondary | ICD-10-CM

## 2022-01-08 DIAGNOSIS — R2689 Other abnormalities of gait and mobility: Secondary | ICD-10-CM

## 2022-01-08 NOTE — Patient Instructions (Signed)
It was wonderful to see you today. Thank you for allowing me to be a part of your care. Below is a short summary of what we discussed at your visit today:  Dizziness I have resent the referral to vestibular rehab.  Please let me know if you need anything else to help connect you with this clinic.  Pain in spine clinic I have placed a new referral to get you to a new pain clinic.  Please let us know if you do not hear from a new clinic in 1 to 2 weeks to schedule your appointment.  Shingles vaccine You may get your shingles vaccines from your favorite pharmacy without a prescription at any time.     Please bring all of your medications to every appointment!  If you have any questions or concerns, please do not hesitate to contact us via phone or MyChart message.   Fayette Pho, MD

## 2022-01-10 ENCOUNTER — Telehealth: Payer: Self-pay

## 2022-01-10 NOTE — Telephone Encounter (Signed)
Pt returned phone call.  Pt is able to stop by office tomorrow to pickup medication. Suggested pt speak with Dr. Valentina Lucks or her pcp about the ozempic issue:   Pt was prescribed mounjaro to take while waiting for ozempic order. Ozempic order is here but it's still behind on the dose she needs; 1mg  weekly. Novo delivered one box with the 2mg  pen (for 0.25mg  & 0.5mg  doses). Pt is aware she should stop the mounjaro once starting back on ozempic, however the ozempic is only a 2 week supply & the next updated shipment may be weeks out.

## 2022-01-10 NOTE — Telephone Encounter (Signed)
Left voicemail informing pt that all novo nordisk meds are ready for pickup. Also asked for the pt to call back to instruct on medication directions.  Call back [phone]  Meds are labeled and ready in med room fridge

## 2022-01-11 ENCOUNTER — Encounter: Payer: Self-pay | Admitting: Family Medicine

## 2022-01-11 DIAGNOSIS — M797 Fibromyalgia: Secondary | ICD-10-CM

## 2022-01-11 DIAGNOSIS — G894 Chronic pain syndrome: Secondary | ICD-10-CM

## 2022-01-11 DIAGNOSIS — E1142 Type 2 diabetes mellitus with diabetic polyneuropathy: Secondary | ICD-10-CM

## 2022-01-12 ENCOUNTER — Other Ambulatory Visit: Payer: Self-pay | Admitting: Family Medicine

## 2022-01-12 DIAGNOSIS — G4733 Obstructive sleep apnea (adult) (pediatric): Secondary | ICD-10-CM | POA: Diagnosis not present

## 2022-01-12 DIAGNOSIS — Z794 Long term (current) use of insulin: Secondary | ICD-10-CM

## 2022-01-12 DIAGNOSIS — E1142 Type 2 diabetes mellitus with diabetic polyneuropathy: Secondary | ICD-10-CM

## 2022-01-12 MED ORDER — DEXCOM G6 TRANSMITTER MISC: 12 refills | Status: DC

## 2022-01-12 MED ORDER — DEXCOM G6 SENSOR MISC
11 refills | Status: DC
Start: 2022-01-12 — End: 2022-01-15

## 2022-01-12 MED ORDER — DEXCOM G6 RECEIVER DEVI: 12 refills | Status: DC

## 2022-01-12 NOTE — Assessment & Plan Note (Signed)
Improving thanks to close follow up with Dr. [name]. Tolerating meds well without adverse side effects. Planning to pick up insulin from clinic today.

## 2022-01-12 NOTE — Assessment & Plan Note (Signed)
New referral needed to vestibular rehab for insurance purposes. Sent today.

## 2022-01-12 NOTE — Telephone Encounter (Signed)
Contacted patient to clarify Ozempic dosing and treatment plan.   Patient was previously seen by Dr. Hughes Better, PharmD  We discussed her supply of medications.  She took her last Ozempic (semaglutide) 1mg  last week.  We discussed need to take 2 injections of the 0.5mg  dose weekly  Patient verbalized understanding of dosing.     I also clarified that each of the 0.25mg /0.5mg  pens would last for two weeks (4 total shots).  She received two pens so she should have 1 month supply.   She also has [place] (tirzepatide) provided by Dr. Georgina Peer as a bridge if she runs out of her Ozempic.   Patient is currently in the process of switching back from [place] to dexcom.  She anticipates that this will happen in the next 2 weeks.    Patient was scheduled for a 1 month follow-up - virtual visit with me.  She thanked me for the clarifications and establishing a folllow-up plan.

## 2022-01-12 NOTE — Assessment & Plan Note (Signed)
New referral to different pain clinic. Previous clinic not a therapeutic relationship.

## 2022-01-12 NOTE — Assessment & Plan Note (Signed)
>>  ASSESSMENT AND PLAN FOR CHRONIC PAIN SYNDROME WRITTEN ON 01/12/2022  5:55 PM BY Fayette Pho, MD  New referral to different pain clinic. Previous clinic not a therapeutic relationship.

## 2022-01-12 NOTE — Telephone Encounter (Signed)
Noted and agree. 

## 2022-01-17 ENCOUNTER — Encounter: Payer: Self-pay | Admitting: Pulmonary Disease

## 2022-01-17 ENCOUNTER — Other Ambulatory Visit: Payer: Self-pay | Admitting: Family Medicine

## 2022-01-17 ENCOUNTER — Encounter: Payer: Self-pay | Admitting: Physical Medicine and Rehabilitation

## 2022-01-17 MED ORDER — DEXCOM G6 SENSOR MISC: 11 refills | Status: DC

## 2022-01-17 MED ORDER — DEXCOM G6 TRANSMITTER MISC: 12 refills | Status: AC

## 2022-01-17 MED ORDER — DEXCOM G6 RECEIVER DEVI: 12 refills | Status: DC

## 2022-01-17 NOTE — Addendum Note (Signed)
Addended by: Valetta Close on: 01/17/2022 05:09 PM ? ? Modules accepted: Orders ? ?

## 2022-01-18 ENCOUNTER — Other Ambulatory Visit: Payer: Self-pay | Admitting: Family Medicine

## 2022-01-18 DIAGNOSIS — I10 Essential (primary) hypertension: Secondary | ICD-10-CM

## 2022-01-18 DIAGNOSIS — E1142 Type 2 diabetes mellitus with diabetic polyneuropathy: Secondary | ICD-10-CM

## 2022-01-18 DIAGNOSIS — K219 Gastro-esophageal reflux disease without esophagitis: Secondary | ICD-10-CM

## 2022-01-18 DIAGNOSIS — H6982 Other specified disorders of Eustachian tube, left ear: Secondary | ICD-10-CM

## 2022-01-19 ENCOUNTER — Other Ambulatory Visit: Payer: Self-pay | Admitting: *Deleted

## 2022-01-19 DIAGNOSIS — G894 Chronic pain syndrome: Secondary | ICD-10-CM

## 2022-01-19 DIAGNOSIS — M797 Fibromyalgia: Secondary | ICD-10-CM

## 2022-01-22 ENCOUNTER — Encounter: Payer: Self-pay | Admitting: Primary Care

## 2022-01-22 ENCOUNTER — Ambulatory Visit: Payer: Medicare PPO | Admitting: Physical Therapy

## 2022-01-22 ENCOUNTER — Ambulatory Visit: Payer: Medicare PPO | Admitting: Primary Care

## 2022-01-22 ENCOUNTER — Other Ambulatory Visit: Payer: Self-pay

## 2022-01-22 VITALS — BP 136/70 | HR 74 | Temp 98.5°F | Ht 61.0 in | Wt 180.0 lb

## 2022-01-22 DIAGNOSIS — G4731 Primary central sleep apnea: Secondary | ICD-10-CM | POA: Diagnosis not present

## 2022-01-22 DIAGNOSIS — G4733 Obstructive sleep apnea (adult) (pediatric): Secondary | ICD-10-CM

## 2022-01-22 MED ORDER — GABAPENTIN 600 MG PO TABS: ORAL_TABLET | ORAL | 3 refills | Status: DC

## 2022-01-22 NOTE — Addendum Note (Signed)
Addended by: Steva Colder on: 01/22/2022 04:49 PM ? ? Modules accepted: Orders ? ?

## 2022-01-22 NOTE — Patient Instructions (Addendum)
HST showed mild complex sleep apnea/hypopnea, AHI 9/hr with nocturnal hypoxia  Recommend BIPAP titration study for possible PAP and oxygen therapy  Recommendations: Focus on side sleeping position or elevate head of bead while sleeping Maintained healthy weigh rainge Do not drive if experiencing excessive daytime sleepiness of fatigue   Orders: Bipap titration study  Follow-up: 3 month follow-up with Dr. Wynona Neatlalere    Sleep Apnea Sleep apnea is a condition in which breathing pauses or becomes shallow during sleep. People with sleep apnea usually snore loudly. They may have times when they gasp and stop breathing for 10 seconds or more during sleep. This may happen many times during the night. Sleep apnea disrupts your sleep and keeps your body from getting the rest that it needs. This condition can increase your risk of certain health problems, including: Heart attack. Stroke. Obesity. Type 2 diabetes. Heart failure. Irregular heartbeat. High blood pressure. The goal of treatment is to help you breathe normally again. What are the causes? The most common cause of sleep apnea is a collapsed or blocked airway. There are three kinds of sleep apnea: Obstructive sleep apnea. This kind is caused by a blocked or collapsed airway. Central sleep apnea. This kind happens when the part of the brain that controls breathing does not send the correct signals to the muscles that control breathing. Mixed sleep apnea. This is a combination of obstructive and central sleep apnea. What increases the risk? You are more likely to develop this condition if you: Are overweight. Smoke. Have a smaller than normal airway. Are older. Are female. Drink alcohol. Take sedatives or tranquilizers. Have a family history of sleep apnea. Have a tongue or tonsils that are larger than normal. What are the signs or symptoms? Symptoms of this condition include: Trouble staying asleep. Loud snoring. Morning  headaches. Waking up gasping. Dry mouth or sore throat in the morning. Daytime sleepiness and tiredness. If you have daytime fatigue because of sleep apnea, you may be more likely to have: Trouble concentrating. Forgetfulness. Irritability or mood swings. Personality changes. Feelings of depression. Sexual dysfunction. This may include loss of interest if you are female, or erectile dysfunction if you are female. How is this diagnosed? This condition may be diagnosed with: A medical history. A physical exam. A series of tests that are done while you are sleeping (sleep study). These tests are usually done in a sleep lab, but they may also be done at home. How is this treated? Treatment for this condition aims to restore normal breathing and to ease symptoms during sleep. It may involve managing health issues that can affect breathing, such as high blood pressure or obesity. Treatment may include: Sleeping on your side. Using a decongestant if you have nasal congestion. Avoiding the use of depressants, including alcohol, sedatives, and narcotics. Losing weight if you are overweight. Making changes to your diet. Quitting smoking. Using a device to open your airway while you sleep, such as: An oral appliance. This is a custom-made mouthpiece that shifts your lower jaw forward. A continuous positive airway pressure (CPAP) device. This device blows air through a mask when you breathe out (exhale). A nasal expiratory positive airway pressure (EPAP) device. This device has valves that you put into each nostril. A bi-level positive airway pressure (BIPAP) device. This device blows air through a mask when you breathe in (inhale) and breathe out (exhale). Having surgery if other treatments do not work. During surgery, excess tissue is removed to create a wider airway.  Follow these instructions at home: Lifestyle Make any lifestyle changes that your health care provider recommends. Eat a healthy,  well-balanced diet. Take steps to lose weight if you are overweight. Avoid using depressants, including alcohol, sedatives, and narcotics. Do not use any products that contain nicotine or tobacco. These products include cigarettes, chewing tobacco, and vaping devices, such as e-cigarettes. If you need help quitting, ask your health care provider. General instructions Take over-the-counter and prescription medicines only as told by your health care provider. If you were given a device to open your airway while you sleep, use it only as told by your health care provider. If you are having surgery, make sure to tell your health care provider you have sleep apnea. You may need to bring your device with you. Keep all follow-up visits. This is important. Contact a health care provider if: The device that you received to open your airway during sleep is uncomfortable or does not seem to be working. Your symptoms do not improve. Your symptoms get worse. Get help right away if: You develop: Chest pain. Shortness of breath. Discomfort in your back, arms, or stomach. You have: Trouble speaking. Weakness on one side of your body. Drooping in your face. These symptoms may represent a serious problem that is an emergency. Do not wait to see if the symptoms will go away. Get medical help right away. Call your local emergency services (911 in the [place] Do not drive yourself to the hospital. Summary Sleep apnea is a condition in which breathing pauses or becomes shallow during sleep. The most common cause is a collapsed or blocked airway. The goal of treatment is to restore normal breathing and to ease symptoms during sleep. This information is not intended to replace advice given to you by your health care provider. Make sure you discuss any questions you have with your health care provider. Document Revised: 06/14/2021 Document Reviewed: 10/14/2020 Elsevier Patient Education  2022 [employer]  Inc[employer]  CPAP and BIPAP Information CPAP and BIPAP are methods that use air pressure to keep your airways open and to help you breathe well. CPAP and BIPAP use different amounts of pressure. Your health care provider will tell you whether CPAP or BIPAP would be more helpful for you. CPAP stands for "continuous positive airway pressure." With CPAP, the amount of pressure stays the same while you breathe in (inhale) and out (exhale). BIPAP stands for "bi-level positive airway pressure." With BIPAP, the amount of pressure will be higher when you inhale and lower when you exhale. This allows you to take larger breaths. CPAP or BIPAP may be used in the hospital, or your health care provider may want you to use it at home. You may need to have a sleep study before your health care provider can order a machine for you to use at home. What are the advantages? CPAP or BIPAP can be helpful if you have: Sleep apnea. Chronic obstructive pulmonary disease (COPD). Heart failure. Medical conditions that cause muscle weakness, including muscular dystrophy or amyotrophic lateral sclerosis (ALS). Other problems that cause breathing to be shallow, weak, abnormal, or difficult. CPAP and BIPAP are most commonly used for obstructive sleep apnea (OSA) to keep the airways from collapsing when the muscles relax during sleep. What are the risks? Generally, this is a safe treatment. However, problems may occur, including: Irritated skin or skin sores if the mask does not fit properly. Dry or stuffy nose or nosebleeds. Dry mouth. Feeling gassy or bloated. Sinus or  lung infection if the equipment is not cleaned properly. When should CPAP or BIPAP be used? In most cases, the mask only needs to be worn during sleep. Generally, the mask needs to be worn throughout the night and during any daytime naps. People with certain medical conditions may also need to wear the mask at other times, such as when they are awake. Follow  instructions from your health care provider about when to use the machine. What happens during CPAP or BIPAP? Both CPAP and BIPAP are provided by a small machine with a flexible plastic tube that attaches to a plastic mask that you wear. Air is blown through the mask into your nose or mouth. The amount of pressure that is used to blow the air can be adjusted on the machine. Your health care provider will set the pressure setting and help you find the best mask for you. Tips for using the mask Because the mask needs to be snug, some people feel trapped or closed-in (claustrophobic) when first using the mask. If you feel this way, you may need to get used to the mask. One way to do this is to hold the mask loosely over your nose or mouth and then gradually apply the mask more snugly. You can also gradually increase the amount of time that you use the mask. Masks are available in various types and sizes. If your mask does not fit well, talk with your health care provider about getting a different one. Some common types of masks include: Full face masks, which fit over the mouth and nose. Nasal masks, which fit over the nose. Nasal pillow or prong masks, which fit into the nostrils. If you are using a mask that fits over your nose and you tend to breathe through your mouth, a chin strap may be applied to help keep your mouth closed. Use a skin barrier to protect your skin as told by your health care provider. Some CPAP and BIPAP machines have alarms that may sound if the mask comes off or develops a leak. If you have trouble with the mask, it is very important that you talk with your health care provider about finding a way to make the mask easier to tolerate. Do not stop using the mask. There could be a negative impact on your health if you stop using the mask. Tips for using the machine Place your CPAP or BIPAP machine on a secure table or stand near an electrical outlet. Know where the on/off switch is  on the machine. Follow instructions from your health care provider about how to set the pressure on your machine and when you should use it. Do not eat or drink while the CPAP or BIPAP machine is on. Food or fluids could get pushed into your lungs by the pressure of the CPAP or BIPAP. For home use, CPAP and BIPAP machines can be rented or purchased through home health care companies. Many different brands of machines are available. Renting a machine before purchasing may help you find out which particular machine works well for you. Your health insurance company may also decide which machine you may get. Keep the CPAP or BIPAP machine and attachments clean. Ask your health care provider for specific instructions. Check the humidifier if you have a dry stuffy nose or nosebleeds. Make sure it is working correctly. Follow these instructions at home: Take over-the-counter and prescription medicines only as told by your health care provider. Ask if you can take sinus  medicine if your sinuses are blocked. Do not use any products that contain nicotine or tobacco. These products include cigarettes, chewing tobacco, and vaping devices, such as e-cigarettes. If you need help quitting, ask your health care provider. Keep all follow-up visits. This is important. Contact a health care provider if: You have redness or pressure sores on your head, face, mouth, or nose from the mask or head gear. You have trouble using the CPAP or BIPAP machine. You cannot tolerate wearing the CPAP or BIPAP mask. Someone tells you that you snore even when wearing your CPAP or BIPAP. Get help right away if: You have trouble breathing. You feel confused. Summary CPAP and BIPAP are methods that use air pressure to keep your airways open and to help you breathe well. If you have trouble with the mask, it is very important that you talk with your health care provider about finding a way to make the mask easier to tolerate. Do not  stop using the mask. There could be a negative impact to your health if you stop using the mask. Follow instructions from your health care provider about when to use the machine. This information is not intended to replace advice given to you by your health care provider. Make sure you discuss any questions you have with your health care provider. Document Revised: 06/14/2021 Document Reviewed: 10/14/2020 Elsevier Patient Education  2022 [employer].

## 2022-01-22 NOTE — Assessment & Plan Note (Addendum)
-   Hx OSA, she has been off CPAP for 1-2 years d/t broken machine. She had repeat HST on 01/03/22 that showed mild complex sleep apnea/hypopnea, AHI 9/hr with nocturnal hypoxia. Reviewed sleep study results with patient. She needs BIPAP titration study to determine pressure setting and assess for oxygen need. She is not regularly taking sleeping aids, prescribed trazodone 25-50mg  qhs prn insomnia and 0.5mg  ativan during daytime for anxiety. Encourage side sleeping position or elevate HOB while sleep at night and weight loss efforts. Advised against driving if experiencing excessive daytime sleepiness. Patient does not want to use [employer] as DME company for new machine or oxygen if needed. We will follow-up by phone call after titration study and recall placed for 3 months. ?

## 2022-01-22 NOTE — Progress Notes (Signed)
_0  ID: Cassandra Reid, female    DOB: 04-Apr-1956, 66 y.o.   MRN: [id number]  No chief complaint on file.   Referring provider: Ezequiel Essex, MD  HPI: 66 year old female, former smoker quit in 1968 ( 3 pack year hx). PMH significant for OSA, HTN, paroxysmal atrial tachycardia, GERD, stage 3 CKD, type 2 diabetes. Patient of Dr. Ander Slade, seen for initial sleep consult on 12/07/21.  Previous [facility] pulmonary encounter: 12/07/21- OSA, consult  Machine became dysfunctional about a year ago Was tolerating CPAP well  Study was performed many years ago she cannot remember exactly-about 2007 She cannot remember how severe sleep apnea was but she was started on CPAP and has been using it regularly. Machine became dysfunctional and had a lot of other things going on was why she waited till now to seek getting a new machine  She is having some issues with tachycardia recently  Usually tries to go to bed between 10 and 11 Takes about 5 minutes to fall asleep 4-5 awakenings  Final wake up time between 6 and 7 AM  Weight is down about 20 pounds recently  Reformed smoker  She does have hypertension, diabetes, history of stroke, history of chronic kidney disease  Does not recollect whether her parents snored   01/22/2022- Interim hx  Patient presents today to review sleep study results. HST on 01/03/22 showed mild complex sleep apnea/hypopnea, AHI 9/hr with nocturnal hypoxia. Recommend BIPAP titration study for pressure setting and assess for oxygen need. She has been off CPAP for approximately 1-1.5 years d/t broke part on her machine. She was previously tolerating PAP therapy. She is prescribed trazodone at bedtime but only has taken medication once. She takes Ativan 0.17m on average no more than once a day for anxiety if leaving the house.    No Known Allergies  Immunization History  Administered Date(s) Administered   Influenza, Quadrivalent, Recombinant, Inj, Pf 07/30/2019    Influenza,inj,Quad PF,6+ Mos 07/18/2018, 07/28/2021   Influenza-Unspecified 07/30/2019, 09/14/2020   PFIZER(Purple Top)SARS-COV-2 Vaccination 02/03/2020, 02/24/2020, 09/15/2020, 04/05/2021   Tdap 01/24/2016    Past Medical History:  Diagnosis Date   Allergic rhinitis 09/26/2018   Anosmia    Arthritis    Asthma    Balance problems 09/22/2021   C. difficile colitis 07/18/2018   Cerebral aneurysm without rupture    Chest pain 03/26/2020   Chronic pain syndrome 04/23/2019   CKD stage 3 due to type 2 diabetes mellitus 04/15/2019   DDD (degenerative disc disease), cervical 04/16/2019   Encephalomalacia on imaging study 08/25/2020   Brain MRI 08/25/21: Few scattered areas of encephalomalacia and gliosis within the overlying left frontal lobe most likely postoperative in nature.   Fatty liver 05/31/2020   Fibromyalgia    Former smoker    Generalized anxiety disorder    GERD (gastroesophageal reflux disease)    Heart murmur    History of chicken pox    History of colon polyps    Hyperlipidemia associated with type 2 diabetes mellitus 07/18/2018   Hypertension associated with diabetes 07/18/2018   IBS (irritable bowel syndrome)    Incontinence 09/22/2021   Bowel and bladder.    Ischemic brain injury 05/29/2021   Lacunar infarction 05/25/2021   Brain MRI 08/25/21: Few small remote lacunar infarcts noted at the right caudate and left internal capsule   Legally blind    Major depressive disorder    Migraines    Myalgia 04/23/2019   OSA (obstructive sleep apnea) 07/18/2018  not using CPAP machine (broken)   Osteoporosis 01/18/2021   Paroxysmal atrial tachycardia    On telemetry   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   Type 2 diabetes mellitus with diabetic polyneuropathy, with long-term current use of insulin 07/18/2018    Tobacco History: Social History   Tobacco Use  Smoking Status Former    Packs/day: 1.00   Years: 3.00   Pack years: 3.00   Types: Cigarettes   Quit date: 06/1967   Years since quitting: 54.6  Smokeless Tobacco Never   Counseling given: Not Answered   Outpatient Medications Prior to Visit  Medication Sig Dispense Refill   amLODipine (NORVASC) 10 MG tablet TAKE 1 TABLET BY MOUTH EVERY DAY 90 tablet 3   atorvastatin (LIPITOR) 40 MG tablet TAKE 1 TABLET BY MOUTH EVERYDAY AT BEDTIME 90 tablet 1   baclofen (LIORESAL) 10 MG tablet TAKE 1 TABLET BY MOUTH TWICE A DAY AS NEEDED FOR MUSCLE SPASM 180 tablet 1   BD PEN NEEDLE NANO 2ND GEN 32G X 4 MM MISC USE WITH INSULIN 100 each 4   Continuous Blood Gluc Receiver (DEXCOM G6 RECEIVER) DEVI Please use as directed to check blood sugar. 1 each 12   Continuous Blood Gluc Sensor (DEXCOM G6 SENSOR) MISC Use as directed to check blood sugar. 4 each 11   Continuous Blood Gluc Transmit (DEXCOM G6 TRANSMITTER) MISC Use as directed to check blood sugar. 1 each 12   dapagliflozin propanediol (FARXIGA) 10 MG TABS tablet Take 1 tablet (10 mg total) by mouth daily before breakfast. This is a patient assistance medication. Patient may not be approved and/or have medication. Please ask and verify when performing med review. 30 tablet 0   dicyclomine (BENTYL) 20 MG tablet Take 1 tablet (20 mg total) by mouth 2 (two) times daily as needed for spasms. 20 tablet 0   escitalopram (LEXAPRO) 5 MG tablet TAKE 2 TABLETS BY MOUTH EVERY DAY 60 tablet 1   famotidine (PEPCID) 20 MG tablet TAKE 1 TABLET BY MOUTH TWICE A DAY 180 tablet 1   fluticasone (FLONASE) 50 MCG/ACT nasal spray Place 1 spray into both nostrils daily as needed for allergies or rhinitis. 9.9 mL 1   furosemide (LASIX) 40 MG tablet Take 1 tablet (40 mg total) by mouth daily. 90 tablet 3   gabapentin (NEURONTIN) 600 MG tablet TAKE 0.5-1 TABLET (300-600 MG TOTAL) BY MOUTH 3 (THREE) TIMES DAILY. 90 tablet 3   glucose blood (ACCU-CHEK GUIDE) test strip Use as instructed to check blood  sugar 90 each 0   glucose blood test strip Use as instructed 100 each 12   insulin aspart (FIASP) 100 UNIT/ML FlexTouch Pen Inject 15 Units into the skin in the morning, at noon, and at bedtime. This is a patient assistance medication. Patient may not be approved and/or have medication. Please ask and verify when performing med review. 15 mL 0   insulin degludec (TRESIBA) 100 UNIT/ML FlexTouch Pen Inject 44 Units into the skin daily. This is a patient assistance medication. Patient may not be approved and/or have medication. Please ask and verify when performing med review. 15 mL 0   KLOR-CON M20 20 MEQ tablet TAKE 1 TABLET BY MOUTH TWICE A DAY 180 tablet 1   Lancet Devices (ONE TOUCH DELICA LANCING DEV) MISC Please use to check blood sugar up to 4 times daily. E11.42 1 each 0   Lancets Misc. (ACCU-CHEK  SOFTCLIX LANCET DEV) KIT Use as instructed to check blood sugar 4 times daily. Dx E11.42 1 kit prn   lisinopril (ZESTRIL) 10 MG tablet Take 1 tablet (10 mg total) by mouth daily. 90 tablet 3   loratadine (CLARITIN) 10 MG tablet TAKE 1 TABLET EVERY DAY 90 tablet 3   LORazepam (ATIVAN) 0.5 MG tablet Take 1 tablet (0.5 mg total) by mouth every 8 (eight) hours as needed for anxiety. 90 tablet 0   magnesium oxide (MAG-OX) 400 (241.3 Mg) MG tablet Take 1 tablet (400 mg total) by mouth 2 (two) times daily. 30 tablet 0   metFORMIN (GLUCOPHAGE-XR) 750 MG 24 hr tablet TAKE 1 TABLET TWICE DAILY WITH MEALS 180 tablet 3   Multiple Vitamin (MULTIVITAMIN PO) Take 1 tablet by mouth daily.     ondansetron (ZOFRAN) 4 MG tablet Take 1 tablet (4 mg total) by mouth every 6 (six) hours. 12 tablet 0   OneTouch Delica Lancets 00T MISC Please use to check blood sugar up to 4 times daily. E11.42 100 each 12   pantoprazole (PROTONIX) 40 MG tablet Take 1 tablet (40 mg total) by mouth daily. 30 tablet 2   sucralfate (CARAFATE) 1 g tablet Take 1 tablet (1 g total) by mouth 4 (four) times daily -  with meals and at bedtime. Okay to  trial off. 120 tablet 0   tirzepatide (MOUNJARO) 2.5 MG/0.5ML Pen Inject 5 mg into the skin once a week. 2 mL 1   traZODone (DESYREL) 50 MG tablet TAKE 1/2 TABLET BY MOUTH AT BEDTIME AS NEEDED FOR SLEEP. 45 tablet 1   No facility-administered medications prior to visit.   Review of Systems  Review of Systems  Constitutional: Negative.   Respiratory: Negative.    Psychiatric/Behavioral: Negative.      Physical Exam  There were no vitals taken for this visit. Physical Exam Constitutional:      Appearance: Normal appearance.  HENT:     Head: Normocephalic and atraumatic.     Mouth/Throat:     Mouth: Mucous membranes are moist.     Pharynx: Oropharynx is clear.  Cardiovascular:     Rate and Rhythm: Normal rate and regular rhythm.  Pulmonary:     Effort: Pulmonary effort is normal.     Breath sounds: Normal breath sounds.  Musculoskeletal:        General: Normal range of motion.  Skin:    General: Skin is warm and dry.  Neurological:     General: No focal deficit present.     Mental Status: She is alert and oriented to person, place, and time. Mental status is at baseline.  Psychiatric:        Mood and Affect: Mood normal.        Behavior: Behavior normal.        Thought Content: Thought content normal.        Judgment: Judgment normal.     Lab Results:  CBC    Component Value Date/Time   WBC 8.8 10/15/2021 1430   RBC 5.38 (H) 10/15/2021 1430   HGB 14.9 10/15/2021 1430   HGB 14.9 11/10/2020 1103   HCT 44.2 10/15/2021 1430   HCT 45.7 11/10/2020 1103   PLT 181 10/15/2021 1430   PLT 189 11/10/2020 1103   MCV 82.2 10/15/2021 1430   MCV 88 11/10/2020 1103   MCH 27.7 10/15/2021 1430   MCHC 33.7 10/15/2021 1430   RDW 13.4 10/15/2021 1430   RDW 13.2 11/10/2020 1103  LYMPHSABS 0.8 10/15/2021 1430   LYMPHSABS 1.2 11/10/2020 1103   MONOABS 0.5 10/15/2021 1430   EOSABS 0.1 10/15/2021 1430   EOSABS 0.1 11/10/2020 1103   BASOSABS 0.1 10/15/2021 1430   BASOSABS 0.1  11/10/2020 1103    BMET    Component Value Date/Time   NA 139 10/15/2021 1430   NA 140 07/28/2021 1129   K 3.5 10/15/2021 1430   CL 104 10/15/2021 1430   CO2 22 10/15/2021 1430   GLUCOSE 168 (H) 10/15/2021 1430   BUN 20 10/15/2021 1430   BUN 16 07/28/2021 1129   CREATININE 0.99 10/15/2021 1430   CREATININE 1.05 (H) 04/27/2020 0844   CALCIUM 10.5 (H) 10/15/2021 1430   GFRNONAA >60 10/15/2021 1430   GFRNONAA 57 (L) 04/27/2020 0844   GFRAA 65 12/09/2020 0957   GFRAA 66 04/27/2020 0844    BNP No results found for: BNP  ProBNP No results found for: PROBNP  Imaging: No results found.   Assessment & Plan:   No problem-specific Assessment & Plan notes found for this encounter.     Martyn Ehrich, NP 01/22/2022

## 2022-01-23 ENCOUNTER — Telehealth: Payer: Self-pay

## 2022-01-23 NOTE — Telephone Encounter (Signed)
Left mssg informing pt that novo nordisk shipment is ready for pickup. ? ?4 boxes labeled and ready in med room fridge ?

## 2022-01-23 NOTE — Telephone Encounter (Signed)
Patient presents to clinic for medications. Provided per note from [place].  ? ? ?Patient also provided fax information for [facility] in order to get Dexcom receiver and sensors. I have faxed to [facility] at [phone] per patient request.  ? ?Veronda Prude, RN ? ?

## 2022-02-01 ENCOUNTER — Ambulatory Visit: Payer: Medicare PPO | Admitting: Family Medicine

## 2022-02-02 ENCOUNTER — Ambulatory Visit: Payer: Medicare PPO | Attending: Family Medicine | Admitting: Physical Therapy

## 2022-02-02 ENCOUNTER — Other Ambulatory Visit: Payer: Self-pay

## 2022-02-02 DIAGNOSIS — R2689 Other abnormalities of gait and mobility: Secondary | ICD-10-CM | POA: Insufficient documentation

## 2022-02-02 DIAGNOSIS — M6281 Muscle weakness (generalized): Secondary | ICD-10-CM | POA: Insufficient documentation

## 2022-02-02 DIAGNOSIS — R42 Dizziness and giddiness: Secondary | ICD-10-CM

## 2022-02-02 DIAGNOSIS — H8111 Benign paroxysmal vertigo, right ear: Secondary | ICD-10-CM | POA: Diagnosis not present

## 2022-02-02 DIAGNOSIS — R2681 Unsteadiness on feet: Secondary | ICD-10-CM

## 2022-02-02 DIAGNOSIS — R262 Difficulty in walking, not elsewhere classified: Secondary | ICD-10-CM

## 2022-02-02 NOTE — Therapy (Signed)
?OUTPATIENT PHYSICAL THERAPY VESTIBULAR EVALUATION ? ? ? ? ?Patient Name: Cassandra Reid ?MRN: [id number] ?DOB:[birth year], 66 y.o., female ?Today's Date: 02/02/2022 ? ?PCP: Ezequiel Essex, MD ?REFERRING PROVIDER: [name], MD ? ? PT End of Session - 02/02/22 1331   ? ? Visit Number 1   ? Number of Visits 9   ? Date for PT Re-Evaluation 05/03/22   ? Authorization Type [employer]   ? Progress Note Due on Visit 10   ? PT Start Time [phone]   ? PT Stop Time [id number]   ? PT Time Calculation (min) 44 min   ? Activity Tolerance Patient tolerated treatment well   ? Behavior During Therapy Anxious   ? ?  ?  ? ?  ? ? ?Past Medical History:  ?Diagnosis Date  ? Allergic rhinitis 09/26/2018  ? Anosmia   ? Arthritis   ? Asthma   ? Balance problems 09/22/2021  ? C. difficile colitis 07/18/2018  ? Cerebral aneurysm without rupture   ? Chest pain 03/26/2020  ? Chronic pain syndrome 04/23/2019  ? CKD stage 3 due to type 2 diabetes mellitus 04/15/2019  ? DDD (degenerative disc disease), cervical 04/16/2019  ? Encephalomalacia on imaging study 08/25/2020  ? Brain MRI 08/25/21: Few scattered areas of encephalomalacia and gliosis within the overlying left frontal lobe most likely postoperative in nature.  ? Fatty liver 05/31/2020  ? Fibromyalgia   ? Former smoker   ? Generalized anxiety disorder   ? GERD (gastroesophageal reflux disease)   ? Heart murmur   ? History of chicken pox   ? History of colon polyps   ? Hyperlipidemia associated with type 2 diabetes mellitus 07/18/2018  ? Hypertension associated with diabetes 07/18/2018  ? IBS (irritable bowel syndrome)   ? Incontinence 09/22/2021  ? Bowel and bladder.   ? Ischemic brain injury 05/29/2021  ? 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 Brain MRI 08/25/21: Few small remote lacunar infarcts noted at the right caudate and left internal capsule  ? Legally blind   ? Major depressive disorder   ? Migraines   ? Myalgia 04/23/2019  ? OSA (obstructive sleep apnea) 07/18/2018  ? not using CPAP  machine (broken)  ? Osteoporosis 01/18/2021  ? Paroxysmal atrial tachycardia   ? On telemetry  ? Retention cyst of nasal sinus 08/25/2020  ? Brain MRI 08/25/20: Large retention cyst largely fills the left maxillary sinus.  ? Type 2 diabetes mellitus with diabetic polyneuropathy, with long-term current use of insulin 07/18/2018  ? ?Past Surgical History:  ?Procedure Laterality Date  ? ABDOMINAL HYSTERECTOMY  2012  ? APPENDECTOMY  1972  ? CEREBRAL ANEURYSM REPAIR Left 2004  ? Aneurysm clip in place near the level of the left ICA on Brain MRI 08/2020. Also coiling performed  ? CHOLECYSTECTOMY  2015  ? CRANIOTOMY Left   ? left pterional craniotomy  ? MENISCUS REPAIR Bilateral   ? ?Patient Active Problem List  ? Diagnosis Date Noted  ? Paroxysmal atrial tachycardia 11/24/2021  ? Cerebral aneurysm without rupture   ? Major depressive disorder   ? Generalized anxiety disorder   ? Balance problems 09/22/2021  ? Incontinence 09/22/2021  ? Obesity (BMI 30.0-34.9) 05/25/2021  ? 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 Anosmia   ? Osteoporosis 01/18/2021  ? Encephalomalacia on imaging study 08/25/2020  ? Fatty liver 05/31/2020  ? Chronic pain syndrome 04/23/2019  ? DDD (degenerative disc disease), cervical 04/16/2019  ? CKD stage 3 due to type 2 diabetes mellitus 04/15/2019  ?  Hypertension associated with diabetes 07/18/2018  ? GERD (gastroesophageal reflux disease) 07/18/2018  ? OSA (obstructive sleep apnea) 07/18/2018  ? Type 2 diabetes mellitus with diabetic polyneuropathy, with long-term current use of insulin 07/18/2018  ? Hyperlipidemia associated with type 2 diabetes mellitus 07/18/2018  ? Fibromyalgia   ? Legally blind 09/01/2003  ? ? ?ONSET DATE: 01/08/2022  ? ?REFERRING DIAG: R26.89 (ICD-10-CM) - Balance problems  ? ?THERAPY DIAG:  ?BPPV (benign paroxysmal positional vertigo), right ? ?Dizziness and giddiness ? ?Unsteadiness on feet ? ?Difficulty in walking, not elsewhere classified ? ?SUBJECTIVE:  ? ?SUBJECTIVE  STATEMENT: ?Was here in November but then had issues with insurance and wasn't able to return.  Pt has new insurance but also has a lot of other medical appointments coming up; wanting to have exercises and things to work on at home to address dizziness and balance.  Pt reports spinning and imbalance have worsened; "it's very scary." ? ?Pt accompanied by: self-pays for her own transportation to appointment; not driving ? ?PERTINENT HISTORY:  ?OSA, anxiety, OA, asthma, brain aneurysm, brain injury due to ischemia, chest pain, CKD due to Type 2 DM, depression, DM, fibromyalgia, heart murmur, HLD, HTN, IBS, lacunar stroke, legally blind, migraines, falls, incontinence - work up for cauda equina syndrome ? ?PAIN:  ?Are you having pain?  Pain in shoulders and upper back pain/neck pain ? ?PRECAUTIONS: Fall ? ?WEIGHT BEARING RESTRICTIONS No ? ?FALLS: Has patient fallen in last 6 months? Yes, Number of falls: 2, sprained wrist ? ?LIVING ENVIRONMENT: ?Lives with: lives with their family and lives alone ?Lives in: House/apartment ?Stairs: Yes; External: 1 steps;   ?Has following equipment at home:  had to use cane/walker at home when really off balance ? ?PLOF: Independent with household mobility without device, Independent with homemaking with ambulation, Independent with gait, and Independent with transfers ? ?PATIENT GOALS To be able to work on balance/dizziness at home and be able to walk dog safely outside  ? ?OBJECTIVE:  ? ?DIAGNOSTIC FINDINGS: None recently ? ?COGNITION: ?Overall cognitive status: Within functional limits for tasks assessed ?  ?SENSATION: ?Always has pins and needles in hands and feet, premorbid ? ?POSTURE: rounded shoulders, forward head, decreased lumbar lordosis, increased thoracic kyphosis, posterior pelvic tilt, and shoulders elevated ? ? ?Cervical ROM:   ? ?Active A/PROM (deg) ?02/02/2022  ?Flexion 40 pulling  ?Extension 15 discomfort  ?Right lateral flexion 20  ?Left lateral flexion 20  ?Right  rotation 30  ?Left rotation 30  ?(Blank rows = not tested) ? ?STRENGTH: L > RLE weakness.  Hip flexion 3 to 3+/5 bilaterally.  L knee flexion/extension: 3+/5, R knee flexion/extension: 4/5, ankle DF: 3+/5 R, 4/5 L ? ?GAIT: ?Gait pattern: step through pattern, decreased step length- Right, decreased step length- Left, and wide BOS ?Distance walked: 115 ?Assistive device utilized: None ?Level of assistance: Modified independence ?Comments: very guarded due to dizziness ? ? ?VESTIBULAR ASSESSMENT ? ? GENERAL OBSERVATION: very guarded, shoulders elevated.  Pt reports imbalance has been present for many years but dizziness is new and has made the imbalance worse. ?  ? SYMPTOM BEHAVIOR: ?  Subjective history: see above ?  Non-Vestibular symptoms: neck pain, headaches, nausea/vomiting, and pt is legally blind and has cataracts ?  Type of dizziness: Imbalance (Disequilibrium), Unsteady with head/body turns, and "fading away" ?  Frequency: intermittently; varies in intensity ?  Duration: 1 hour until able to fall back asleep ?  Aggravating factors: Spontaneous, Induced by position change: lying supine, rolling to the  right, rolling to the left, and supine to sit, and Induced by motion: can occur spontaneously when sitting ?  Relieving factors: head stationary and grabbing side of the bed to stabilize ?  Progression of symptoms: worse ? ? OCULOMOTOR EXAM: ?  Ocular Alignment: abnormal ?  Ocular ROM:  N/T formally ?  Spontaneous Nystagmus: absent ?  Gaze-Induced Nystagmus: absent ?  Smooth Pursuits: intact and dysconjugate movement with diagonal smooth pursuit ?  Saccades: intact and slight dizziness ?   ? VESTIBULAR - OCULAR REFLEX:  ?  Slow VOR: Normal ?  VOR Cancellation: Comment: Normal but reported mild dizziness ?  Head-Impulse Test: HIT Right: negative ?HIT Left: negative ?  Dynamic Visual Acuity:  N/T ?  ? POSITIONAL TESTING: Right Dix-Hallpike: upbeating, right nystagmus; Duration:45 seconds ?Left Dix-Hallpike:  N/T ?Right Roll Test: none; Duration: 0 ?Left Roll Test: none; Duration: 0 ?  ? ?MOTION SENSITIVITY: ? ?  Motion Sensitivity Quotient ? ?Intensity: 0 = none, 1 = Lightheaded, 2 = Mild, 3 = Moderate, 4 = Severe, 5 = Vomiting

## 2022-02-04 ENCOUNTER — Other Ambulatory Visit: Payer: Self-pay | Admitting: Family Medicine

## 2022-02-04 DIAGNOSIS — E1169 Type 2 diabetes mellitus with other specified complication: Secondary | ICD-10-CM

## 2022-02-05 ENCOUNTER — Encounter: Payer: Self-pay | Admitting: Family Medicine

## 2022-02-05 ENCOUNTER — Ambulatory Visit: Payer: Medicare PPO | Admitting: Psychologist

## 2022-02-06 ENCOUNTER — Encounter: Payer: Medicare PPO | Admitting: Family Medicine

## 2022-02-07 ENCOUNTER — Other Ambulatory Visit: Payer: Self-pay | Admitting: Family Medicine

## 2022-02-07 DIAGNOSIS — E1142 Type 2 diabetes mellitus with diabetic polyneuropathy: Secondary | ICD-10-CM

## 2022-02-08 ENCOUNTER — Ambulatory Visit: Payer: Medicare PPO | Admitting: Physical Therapy

## 2022-02-08 ENCOUNTER — Other Ambulatory Visit: Payer: Self-pay

## 2022-02-08 DIAGNOSIS — H8111 Benign paroxysmal vertigo, right ear: Secondary | ICD-10-CM | POA: Diagnosis not present

## 2022-02-08 DIAGNOSIS — M6281 Muscle weakness (generalized): Secondary | ICD-10-CM

## 2022-02-08 DIAGNOSIS — R2681 Unsteadiness on feet: Secondary | ICD-10-CM

## 2022-02-08 DIAGNOSIS — R262 Difficulty in walking, not elsewhere classified: Secondary | ICD-10-CM | POA: Diagnosis not present

## 2022-02-08 DIAGNOSIS — R42 Dizziness and giddiness: Secondary | ICD-10-CM | POA: Diagnosis not present

## 2022-02-08 DIAGNOSIS — R2689 Other abnormalities of gait and mobility: Secondary | ICD-10-CM | POA: Diagnosis not present

## 2022-02-08 NOTE — Therapy (Addendum)
OUTPATIENT PHYSICAL THERAPY VESTIBULAR TREATMENT     Patient Name: CLORA OHMER MRN: [id number] DOB:01/30/1956, 66 y.o., female Today's Date: 02/08/2022  PCP: Fayette Pho, MD REFERRING PROVIDER: Moses Manners, MD   PT End of Session - 02/08/22 [phone]     Visit Number 2    Number of Visits 9    Date for PT Re-Evaluation 05/03/22    Authorization Type [employer]    Progress Note Due on Visit 10    PT Start Time 0851    PT Stop Time 0929    PT Time Calculation (min) 38 min    Activity Tolerance Patient tolerated treatment well    Behavior During Therapy Anxious              Past Medical History:  Diagnosis Date   Allergic rhinitis 09/26/2018   Anosmia    Arthritis    Asthma    Balance problems 09/22/2021   C. difficile colitis 07/18/2018   Cerebral aneurysm without rupture    Chest pain 03/26/2020   Chronic pain syndrome 04/23/2019   CKD stage 3 due to type 2 diabetes mellitus 04/15/2019   DDD (degenerative disc disease), cervical 04/16/2019   Encephalomalacia on imaging study 08/25/2020   Brain MRI 08/25/21: Few scattered areas of encephalomalacia and gliosis within the overlying left frontal lobe most likely postoperative in nature.   Fatty liver 05/31/2020   Fibromyalgia    Former smoker    Generalized anxiety disorder    GERD (gastroesophageal reflux disease)    Heart murmur    History of chicken pox    History of colon polyps    Hyperlipidemia associated with type 2 diabetes mellitus 07/18/2018   Hypertension associated with diabetes 07/18/2018   IBS (irritable bowel syndrome)    Incontinence 09/22/2021   Bowel and bladder.    Ischemic brain injury 05/29/2021   Lacunar infarction 05/25/2021   Brain MRI 08/25/21: Few small remote lacunar infarcts noted at the right caudate and left internal capsule   Legally blind    Major depressive disorder    Migraines    Myalgia 04/23/2019   OSA (obstructive sleep apnea) 07/18/2018   not using CPAP  machine (broken)   Osteoporosis 01/18/2021   Paroxysmal atrial tachycardia    On telemetry   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   Type 2 diabetes mellitus with diabetic polyneuropathy, with long-term current use of insulin 07/18/2018   Past Surgical History:  Procedure Laterality Date   ABDOMINAL HYSTERECTOMY  2012   APPENDECTOMY  1972   CEREBRAL ANEURYSM REPAIR Left 2004   Aneurysm clip in place near the level of the left ICA on Brain MRI 08/2020. Also coiling performed   CHOLECYSTECTOMY  2015   CRANIOTOMY Left    left pterional craniotomy   MENISCUS REPAIR Bilateral    Patient Active Problem List   Diagnosis Date Noted   Paroxysmal atrial tachycardia 11/24/2021   Cerebral aneurysm without rupture    Major depressive disorder    Generalized anxiety disorder    Balance problems 09/22/2021   Incontinence 09/22/2021   Obesity (BMI 30.0-34.9) 05/25/2021   Lacunar infarction 05/25/2021   Anosmia    Osteoporosis 01/18/2021   Encephalomalacia on imaging study 08/25/2020   Fatty liver 05/31/2020   Chronic pain syndrome 04/23/2019   DDD (degenerative disc disease), cervical 04/16/2019   CKD stage 3 due to type 2 diabetes mellitus  04/15/2019   Hypertension associated with diabetes 07/18/2018   GERD (gastroesophageal reflux disease) 07/18/2018   OSA (obstructive sleep apnea) 07/18/2018   Type 2 diabetes mellitus with diabetic polyneuropathy, with long-term current use of insulin 07/18/2018   Hyperlipidemia associated with type 2 diabetes mellitus 07/18/2018   Fibromyalgia    Legally blind 09/01/2003    ONSET DATE: 01/08/2022   REFERRING DIAG: R26.89 (ICD-10-CM) - Balance problems   THERAPY DIAG:  BPPV (benign paroxysmal positional vertigo), right  Dizziness and giddiness  Unsteadiness on feet  Difficulty in walking, not elsewhere classified  Muscle weakness  (generalized)  SUBJECTIVE:   SUBJECTIVE STATEMENT:  Has had a little bit of dizziness since eval.  Some pain/tightness on L side of neck.  Feeling anxious today.  PERTINENT HISTORY:  OSA, anxiety, OA, asthma, brain aneurysm, brain injury due to ischemia, chest pain, CKD due to Type 2 DM, depression, DM, fibromyalgia, heart murmur, HLD, HTN, IBS, lacunar stroke, legally blind, migraines, falls, incontinence - work up for cauda equina syndrome  PAIN:  Are you having pain?  Pain in shoulders and upper back pain/neck pain  PRECAUTIONS: Fall  PATIENT GOALS To be able to work on balance/dizziness at home and be able to walk dog safely outside    OBJECTIVE:   POSITIONAL TESTS:  Right Dix-Hallpike: none and but pt reports vertigo ; Duration:15 seconds  Right Roll Test: none; Duration: 0 Left Roll Test: none; Duration: 0   VESTIBULAR TREATMENT:  Canalith Repositioning: Epley Right: Number of Reps: 3, Response to Treatment: symptoms improved, and Comment: decreased intensity and duration during second assessment but continued to have brief vertigo with third assessment       MOTION SENSITIVITY:    Motion Sensitivity Quotient  Intensity: 0 = none, 1 = Lightheaded, 2 = Mild, 3 = Moderate, 4 = Severe, 5 = Vomiting  Intensity  1. Sitting to supine 0  2. Supine to L side 0  3. Supine to R side 0  4. Supine to sitting 2  5. L Hallpike-Dix   6. Up from L    7. R Hallpike-Dix 2  8. Up from R  2  9. Sitting, head  tipped to L knee 0  10. Head up from L  knee 1  11. Sitting, head  tipped to R knee 1  12. Head up from R  knee 2  13. Sitting head turns x5 0 but neck pain  14.Sitting head nods x5 0 but neck discomfort  15. In stance, 180  turn to L  1  16. In stance, 180  turn to R 2    [name] PT Assessment - 02/08/22 0923       Standardized Balance Assessment   Standardized Balance Assessment Berg Balance Test      Berg Balance Test   Sit to Stand Able to stand without  using hands and stabilize independently    Standing Unsupported Able to stand safely 2 minutes    Sitting with Back Unsupported but Feet Supported on Floor or Stool Able to sit safely and securely 2 minutes    Stand to Sit Sits safely with minimal use of hands    Transfers Able to transfer safely, minor use of hands    Standing Unsupported with Eyes Closed Needs help to keep from falling    Standing Unsupported with Feet Together Able to place feet together independently but unable to hold for 30 seconds    From Standing, Reach Forward with Outstretched Arm  Can reach confidently >25 cm (10")    From Standing Position, Pick up Object from Floor Able to pick up shoe safely and easily    From Standing Position, Turn to Look Behind Over each Shoulder Needs supervision when turning    Turn 360 Degrees Needs close supervision or verbal cueing    Standing Unsupported, Alternately Place Feet on Step/Stool Able to complete 4 steps without aid or supervision    Standing Unsupported, One Foot in Front Able to plae foot ahead of the other independently and hold 30 seconds    Standing on One Leg Able to lift leg independently and hold 5-10 seconds    Total Score 40    Berg comment: 40/56             PATIENT EDUCATION: Education details: Ongoing R BPPV, results of balance assessment, plan for next visit Person educated: Patient Education method: Explanation Education comprehension: verbalized understanding   GOALS: Goals reviewed with patient? Yes  SHORT TERM GOALS: Target date: 03/02/2022  Pt will demonstrate negative positional testing for all canals L and R Baseline: R posterior canal BPPV Goal status: INITIAL  2.  Pt will complete further falls risk/balance assessment and will initiate balance HEP Baseline: Need to assess BERG Goal status: MET  3.  Pt will report no dizziness with supine <> sit, bending down to the ground, and repeated head and body turns. Baseline:  Goal status:  INITIAL    LONG TERM GOALS: Target date: 04/27/2022  Pt will demonstrate full resolution of vertigo with positional testing  Baseline:  Goal status: INITIAL  2.  Pt will demonstrate independence with final balance/vestibular HEP Baseline:  Goal status: INITIAL  3.  Pt will demonstrate 4 point improvement in DGI to indicate decreased falls risk when ambulating outside.   -Goal deferred for now  4.  Pt will demonstrate 6 point improvement in BERG to indicate decreased falls risk. Baseline: 40/56 Goal status: INITIAL  5.  Pt will ambulate outside over uneven grassy/gravel/mulch surfaces with LRAD MOD I to safely be able to walk her dog outside. Baseline:  Goal status: INITIAL    ASSESSMENT:  CLINICAL IMPRESSION:  Pt continues to present with R posterior canal BPPV and vertigo but decreased intensity and duration today; treated x3 with CRM.  Continued to assess balance impairments and falls risk with BERG and assessed motion sensitivity.  Plan to continue to treat BPPV next session and will initiate HEP next session.    OBJECTIVE IMPAIRMENTS decreased balance, difficulty walking, decreased ROM, decreased strength, dizziness, postural dysfunction, pain, and R BPPV .   ACTIVITY LIMITATIONS cleaning and community activity.   PERSONAL FACTORS Fitness, Past/current experiences, Social background, Time since onset of injury/illness/exacerbation, Transportation, and 3+ comorbidities: see above  are also affecting patient's functional outcome.    REHAB POTENTIAL: Fair not able to come consistently each week due to transportation and financial concerns  CLINICAL DECISION MAKING: Evolving/moderate complexity  EVALUATION COMPLEXITY: Moderate   PLAN: PT FREQUENCY:  1x/week x 4 and then once every two weeks to check in and revise HEP  PT DURATION: 12 weeks  PLANNED INTERVENTIONS: Therapeutic exercises, Therapeutic activity, Neuromuscular re-education, Balance training, Gait training,  Patient/Family education, Vestibular training, Canalith repositioning, DME instructions, Cryotherapy, Moist heat, and Manual therapy  PLAN FOR NEXT SESSION: Check and treat R BPPV, check for L BPPV.  Initiate HEP - vestibular habituation (VOR may not work because of visual impairments), standing balance!, LE strengthening, neck ROM and pain relief  exercises. Patient has limited finances and only wants to come every few weeks to check in and get HEP updates.  Adjust visits as needed.   Dierdre Highman, PT, DPT 02/08/22    11:38 AM

## 2022-02-12 ENCOUNTER — Telehealth (INDEPENDENT_AMBULATORY_CARE_PROVIDER_SITE_OTHER): Payer: Medicare PPO | Admitting: Pharmacist

## 2022-02-12 DIAGNOSIS — E1142 Type 2 diabetes mellitus with diabetic polyneuropathy: Secondary | ICD-10-CM | POA: Diagnosis not present

## 2022-02-12 DIAGNOSIS — Z794 Long term (current) use of insulin: Secondary | ICD-10-CM | POA: Diagnosis not present

## 2022-02-12 MED ORDER — OZEMPIC (2 MG/DOSE) 8 MG/3ML ~~LOC~~ SOPN
2.0000 mg | PEN_INJECTOR | SUBCUTANEOUS | Status: DC
Start: 2022-02-12 — End: 2022-12-07

## 2022-02-12 MED ORDER — INSULIN DEGLUDEC 100 UNIT/ML ~~LOC~~ SOPN: 45.0000 [IU] | PEN_INJECTOR | Freq: Every day | SUBCUTANEOUS | Status: DC

## 2022-02-12 NOTE — Progress Notes (Signed)
? ? ?  S:    ? ?Chief Complaint  ?Patient presents with  ? Medication Management  ?  Diabetes  ? ? ?Cassandra Reid is a 66 y.o. female who is arrived for a video visit.  Patient was connected from home.  I conducted visit from the [facility]. Visit for diabetes evaluation, education, and management. PMH is significant for Diabetes. Patient was  last seen by Dr. Clent Ridges 3.6.2023 and Primary Care Provider, Dr. Larita Fife, on 01/08/2022. Patient was las see by Pharmacy Clinic, [name] on 01/04/2022.   ? ?Visit today was conducted via video visit.  ? ?Current diabetes medications include: Ozempic (semaglutide) 1mg  weekly.  Fiasp 18 units TID prior to meals, Tresiba 48 units once daily.  ? ? ?Patient states adherence with medications as prescribed.  ?Do you feel that your medications are working for you? yes ?Have you been experiencing any side effects to the medications prescribed? no ?Do you have any problems obtaining medications due to transportation or finances? No - states > 1 month supply of medications.  ? ? ?Patient denies hypoglycemic events. ? ?Reported home fasting blood sugars: 90-low 200s.  Reports early morning (3 AM) blood sugar elevations consistently due to unknown reason.  ? ?We were unable to review her CGM report due to patient recreating her log-in under a different [employer] account.  ?She anticipates returning to [facility] in the next few days/weeks.  ? ?Patient reported dietary habits: Eats 3 meals/day ? ? ?O:  ? ? ?Review of Systems  ?All other systems reviewed and are negative. ? ? ? ?Lab Results  ?Component Value Date  ? HGBA1C 7.7 (A) 12/27/2021  ? ? ? ?Lipid Panel  ?   ?Component Value Date/Time  ? CHOL 120 07/31/2019 1339  ? TRIG 242 (H) 07/31/2019 1339  ? HDL 42 (L) 07/31/2019 1339  ? CHOLHDL 2.9 07/31/2019 1339  ? VLDL 38.2 07/18/2018 0940  ? LDLCALC 48 07/31/2019 1339  ? ? ? ?A/P: ?Diabetes longstanding, currently improved control with use of basal/bolus/GLP therapy.  Patient is able to verbalize appropriate hypoglycemia management plan. Medication adherence appears good. Control is suboptimal due to nocturnal elevations, possibly related to oxygen desaturation related stresses. (Patient is scheduled for C-PAP evaluation in the upcoming few weeks).  ?-Adjusted dose of basal insulin Tresiba  (insulin degludec) from 48 daily to 45 units daily ?-Continued rapid insulin Fiasp (insulin aspart) at 18 units prior to each of 3 meals daily.  ?-Increased dose of GLP-1 Ozempic  (semaglutide) from 1mg  to 2mg  weekly (she was instructed to use 2 of the 1mg  injections with current supply until all current supply is used.  New prescription assistance application / dose change for Ozempic will be forwarded to [facility] for assistance in dose escalation and obtaining supply.  ?--Continued metformin 750mg  XR at two doses daily.   ?-Patient educated on purpose, proper use, and potential adverse effects of increasing GLP and risk of lower blood glucose readings.  ?-Extensively discussed pathophysiology of diabetes, recommended lifestyle interventions, dietary effects on blood sugar control.  ?-Counseled on s/sx of and management of hypoglycemia.  ? ?Total time in [place]- Face-To-Face: 18 minutes ? ?  ? ?Follow up pharmacist video visit in 4 weeks.  PCP clinic visit in 6-8 weeks.. Patient seen with PharmD Candidate.   ?. ? ?

## 2022-02-12 NOTE — Patient Instructions (Signed)
Increase Ozempic to 2mg  weekly on Friday.  ? ?On Friday, decrease your Tresiba from 48 to 45 units once weekly.  ? ?Next video visit April 24th.  ?

## 2022-02-12 NOTE — Progress Notes (Signed)
Agree 

## 2022-02-12 NOTE — Assessment & Plan Note (Signed)
Diabetes longstanding, currently improved control with use of basal/bolus/GLP therapy. Patient is able to verbalize appropriate hypoglycemia management plan. Medication adherence appears good. Control is suboptimal due to nocturnal elevations, possibly related to oxygen desaturation related stresses. (Patient is scheduled for C-PAP evaluation in the upcoming few weeks).  ?-Adjusted dose of basal insulin Tresiba  (insulin degludec) from 48 daily to 45 units daily ?-Continued rapid insulin Fiasp (insulin aspart) at 18 units prior to each of 3 meals daily.  ?-Increased dose of GLP-1 Ozempic  (semaglutide) from 1mg  to 2mg  weekly (she was instructed to use 2 of the 1mg  injections with current supply until all current supply is used.  New prescription assistance application / dose change for Ozempic will be forwarded to [facility] for assistance in dose escalation and obtaining supply.  ?--Continued metformin 750mg  XR at two doses daily.   ?-Patient educated on purpose, proper use, and potential adverse effects of increasing GLP and risk of lower blood glucose readings.  ?-Extensively discussed pathophysiology of diabetes, recommended lifestyle interventions, dietary effects on blood sugar control.  ?-Counseled on s/sx of and management of hypoglycemia.  ?

## 2022-02-12 NOTE — Progress Notes (Signed)
Reviewed: I agree with Dr. Koval's documentation and management. 

## 2022-02-14 ENCOUNTER — Ambulatory Visit: Payer: Medicare PPO

## 2022-02-22 ENCOUNTER — Ambulatory Visit: Payer: Medicare PPO | Admitting: Physical Therapy

## 2022-02-22 ENCOUNTER — Ambulatory Visit (HOSPITAL_COMMUNITY): Payer: Self-pay

## 2022-02-26 ENCOUNTER — Ambulatory Visit (INDEPENDENT_AMBULATORY_CARE_PROVIDER_SITE_OTHER): Payer: Medicare PPO | Admitting: Psychologist

## 2022-02-26 DIAGNOSIS — F331 Major depressive disorder, recurrent, moderate: Secondary | ICD-10-CM

## 2022-02-26 DIAGNOSIS — F411 Generalized anxiety disorder: Secondary | ICD-10-CM | POA: Diagnosis not present
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
## 2022-02-26 NOTE — Progress Notes (Signed)
                Fredrika Canby, PsyD 

## 2022-02-26 NOTE — Plan of Care (Signed)

## 2022-02-27 ENCOUNTER — Other Ambulatory Visit: Payer: Self-pay | Admitting: Family Medicine

## 2022-02-27 ENCOUNTER — Encounter: Payer: Self-pay | Admitting: Family Medicine

## 2022-02-27 DIAGNOSIS — F419 Anxiety disorder, unspecified: Secondary | ICD-10-CM

## 2022-02-28 ENCOUNTER — Other Ambulatory Visit: Payer: Self-pay | Admitting: Family Medicine

## 2022-02-28 ENCOUNTER — Telehealth: Payer: Self-pay

## 2022-02-28 DIAGNOSIS — Z1231 Encounter for screening mammogram for malignant neoplasm of breast: Secondary | ICD-10-CM

## 2022-02-28 NOTE — Telephone Encounter (Signed)
Informed pt all 3 of her novo nordisk medications are ready for pickup. Pt can come today or tomorrow. ? ?Pauline Good, & Ozempic are all labeled and ready in med room fridge. ?

## 2022-03-01 ENCOUNTER — Encounter: Payer: Self-pay | Admitting: Family Medicine

## 2022-03-02 NOTE — Telephone Encounter (Signed)
Medication given to patient

## 2022-03-05 ENCOUNTER — Ambulatory Visit (HOSPITAL_BASED_OUTPATIENT_CLINIC_OR_DEPARTMENT_OTHER): Payer: Medicare PPO | Attending: Primary Care | Admitting: Pulmonary Disease

## 2022-03-05 DIAGNOSIS — G4731 Primary central sleep apnea: Secondary | ICD-10-CM | POA: Insufficient documentation

## 2022-03-06 ENCOUNTER — Ambulatory Visit (INDEPENDENT_AMBULATORY_CARE_PROVIDER_SITE_OTHER): Payer: Medicare PPO | Admitting: Psychologist

## 2022-03-06 DIAGNOSIS — F411 Generalized anxiety disorder: Secondary | ICD-10-CM

## 2022-03-06 DIAGNOSIS — F331 Major depressive disorder, recurrent, moderate: Secondary | ICD-10-CM | POA: Diagnosis not present

## 2022-03-06 NOTE — Progress Notes (Signed)
[facility] Counselor/Therapist Progress Note ? ?Patient ID: Cassandra Reid, MRN: [id number],   ? ?Date: 03/06/2022 ? ?Time Spent: 12:03 pm to 12:45 pm; total time: 42 minutes ? ? This session was held via phone teletherapy due to the coronavirus risk at this time. The patient consented to phone teletherapy and was located at her home during this session. She is aware it is the responsibility of the patient to secure confidentiality on her end of the session. The provider was in a private home office for the duration of this session. Limits of confidentiality were discussed with the patient.  ? ?Treatment Type: Individual Therapy ? ?Reported Symptoms: Depression ? ?Mental Status Exam: ?Appearance:  NA     ?Behavior: Appropriate  ?Motor: Normal  ?Speech/Language:  Clear and Coherent  ?Affect: Appropriate  ?Mood: normal  ?Thought process: normal  ?Thought content:   WNL  ?Sensory/Perceptual disturbances:   WNL  ?Orientation: oriented to person, place, and time/date  ?Attention: Good  ?Concentration: Good  ?Memory: WNL  ?Fund of knowledge:  Fair  ?Insight:   Fair  ?Judgment:  Fair  ?Impulse Control: Good  ? ?Risk Assessment: ?Danger to Self:  No ?Self-injurious Behavior: No ?Danger to Others: No ?Duty to Warn:no ?Physical Aggression / Violence:No  ?Access to Firearms a concern: No  ?Gang Involvement:No  ? ?Subjective: Beginning the session, patient described herself as okay. After reviewing the treatment plan, patient spent the session reflecting on the different losses she has experienced recently in her life. She also reflected on activities she can't participate in currently. She also reflected on how she attempts to come up with solutions without success. She processed her thoughts and emotions. She ended the session agreeable to the homework discussed. She denied suicidal and homicidal ideation.   ? ?Interventions:  Worked on developing a therapeutic relationship with the patient using active  listening and reflective statements. Provided emotional support using empathy and validation. Reviewed the treatment plan. Reviewed events since the intake. Identified goals for the session. Assisted the patient in exploring different strategies to address her concerns. Explored the idea of attempting a different approach by not fighting her experience. Processed thoughts and emotions. Used socratic questions to assist the patient gain insight into self. Challenged some of those thoughts. Processed the idea of grieving and exploring giving herself permission to grieve. Assigned homework. Assessed for suicidal and homicidal ideation.  ? ?Homework: Write letter to grief ? ?Next Session: Review homework, coping skills, and emotional support ? 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 ?Plan:  ? ?Goals ?Work through the grieving process and face reality of own death ?Accept emotional support from others around them ?Live life to the fullest, event though time may be limited ?Become as knowledgeable about the medical condition  ?Reduce fear, anxiety about the health condition  ?Accept the illness ?Accept the role of psychological and behavioral factors  ?Stabilize anxiety level wile increasing ability to function ?Learn and implement coping skills that result in a reduction of anxiety  ?Alleviate depressive symptoms ?Recognize, accept, and cope with depressive feelings ?Develop healthy thinking patterns ?Develop healthy interpersonal relationships ? ?Objectives target date for all objectives is 02/27/2023 ?Identify feelings associated with the illness ?Family members share with each other feelings ?Identify the losses or limitations that have been experienced ?Verbalize acceptance of the reality of the medical condition ?Commit to learning and implement a proactive approach to managing personal stresses ?Verbalize an understanding of the  medical  condition ?Work with therapist to develop a plan for coping with stress ?Learn and implement skills for managing stress ?Engage in social, productive activities that are possible ?Engage in faith based activities ?implement positive imagery ?Identify coping skills and sources of emotional support ?Patient's partner and family members verbalize their fears regarding severity of health condition ?Identify sources of emotional distress  ?Learning and implement calming skills to reduce overall anxiety ?Learn and implement problem solving strategies ?Identify and engage in pleasant activities ?Learning and implement personal and interpersonal skills to reduce anxiety and improve interpersonal relationships ?Learn to accept limitations in life and commit to tolerating, rather than avoiding, unpleasant emotions while accomplishing meaningful goals ?Identify major life conflicts from the past and present that form the basis for present anxiety ?Learn and implement behavioral strategies ?Verbalize an understanding and resolution of current interpersonal problems ?Learn and implement problem solving and decision making skills ?Learn and implement conflict resolution skills to resolve interpersonal problems ?Verbalize an understanding of healthy and unhealthy emotions verbalize insight into how past relationships may be influence current experiences with depression ?Use mindfulness and acceptance strategies and increase value based behavior  ?Increase hopeful statements about the future.  ? ?Interventions ?Teach about stress and ways to handle stress ?Assist the patient in developing a coping action plan for stressors ?Conduct skills based training for coping strategies ?Train problem focused skills ?Sort out what activities the individual can do ?Encourage patient to rely upon his/her spiritual faith ?Teach the patient to use guided imagery ?Probe and evaluate family's ability to provide emotional support ?Allow family to  share their fears ?Assist the patient in identifying, sorting through, and verbalizing the various feelings generated by his/her medical condition ?Meet with family members  ?Ask patient list out limitations  ?Use stress inoculation training  ?Use Acceptance and Commitment Therapy to help client accept uncomfortable realities in order to accomplish value-consistent goals ?Reinforce the client's insight into the role of his/her past emotional pain and present anxiety  ?Discuss examples demonstrating that unrealistic worry overestimates the probability of threats and underestimate patient's ability  ?Assist the patient in analyzing his or her worries ?Help patient understand that avoidance is reinforcing  ?Behavioral activation help the client explore the relationship, nature of the dispute,  ?Help the client develop new interpersonal skills and relationships ?Conduct Problem so living therapy ?Teach conflict resolution skills ?Use a process-experiential approach ?Conduct TLDP ?Conduct ACT ? ?The patient and clinician reviewed the treatment plan on 03/06/2022. The patient approved of the treatment plan.  ? ?Hilbert Corrigan, PsyD ? ? ? ?

## 2022-03-06 NOTE — Progress Notes (Signed)
                Lindzie Boxx, PsyD 

## 2022-03-08 DIAGNOSIS — G4731 Primary central sleep apnea: Secondary | ICD-10-CM | POA: Diagnosis not present

## 2022-03-08 NOTE — Procedures (Signed)
Patient Name: Cassandra Reid, Cassandra Reid ?Study Date: 03/05/2022 ?Gender: Female ?D.O.B: [birth year] ?Age (years): 7 ?Referring Provider: Geraldo Pitter NP ?Height (inches): 61 ?Interpreting Physician: Kara Mead MD, ABSM ?Weight (lbs): 170 ?RPSGT: Carolin Coy ?BMI: 32 ?MRN: [id number] ?Neck Size: 15.00 ?<br> <br> ?CLINICAL INFORMATION ?The patient is referred for a BiPAP titration to treat sleep apnea. ? ? ? ?Date of HST: 12/2021 AHI 9/h, central apneas 2/h ? ?SLEEP STUDY TECHNIQUE ?As per the AASM Manual for the Scoring of Sleep and Associated Events v2.3 (April 2016) with a hypopnea requiring 4% desaturations. ? ?The channels recorded and monitored were frontal, central and occipital EEG, electrooculogram (EOG), submentalis EMG (chin), nasal and oral airflow, thoracic and abdominal wall motion, anterior tibialis EMG, snore microphone, electrocardiogram, and pulse oximetry. Bilevel positive airway pressure (BPAP) was initiated at the beginning of the study and titrated to treat sleep-disordered breathing. ? ?MEDICATIONS ?Medications self-administered by patient taken the night of the study : GABAPENTIN, AMLODIPINE ? ?RESPIRATORY PARAMETERS ?Optimal IPAP Pressure (cm): 25 AHI at Optimal Pressure (/hr) 0 ?Optimal EPAP Pressure (cm): 21  ? ?Overall Minimal O2 (%): 84.0 Minimal O2 at Optimal Pressure (%): 94.0 ?SLEEP ARCHITECTURE ?Start Time: 10:52:16 PM Stop Time: 4:48:00 AM Total Time (min): 355.7 Total Sleep Time (min): 276 ?Sleep Latency (min): 27.7 Sleep Efficiency (%): 77.6% REM Latency (min): 89.0 WASO (min): 52.1 ?Stage N1 (%): 9.1% Stage N2 (%): 79.0% Stage N3 (%): 0.0% Stage R (%): 12 ?Supine (%): 73.26 Arousal Index (/hr): 26.7  ? ? ? ?CARDIAC DATA ?The 2 lead EKG demonstrated sinus rhythm. The mean heart rate was 60.0 beats per minute. Other EKG findings include: PVCs. ? ? ?LEG MOVEMENT DATA ?The total Periodic Limb Movements of Sleep (PLMS) were 0. The PLMS index was 0.0. A PLMS index of <15 is considered normal in  adults. ? ?IMPRESSIONS ?- An optimal PAP pressure was selected for this patient ( 25 / 21 cm of water). Note that this study was ordered & performed as a biPAP titration. ?- Mild Central Sleep Apnea was noted during this titration (CAI = 8.5/h). ?- Moderete oxygen desaturations were observed during this titration (min O2 = 84.0%). ?- The patient snored with soft snoring volume. ?- 2-lead EKG demonstrated: PVCs ?- Clinically significant periodic limb movements were not noted during this study. Arousals associated with PLMs were rare. ? ? ?DIAGNOSIS ?- Obstructive Sleep Apnea (G47.33) ?- Treatment emergent central sleep apnea ? ? ?RECOMMENDATIONS ?- Consider trial of autoCPAP if this has not been done already .Low pressures may suffice ?- If residual events persist on CPAP or significant centrals emerge , then considerTrial of BiPAP therapy on 25/21 cm H2O (or autobipap ) with a Small size Resmed Full Face AirFit F20 mask and heated humidification. ?- Avoid alcohol, sedatives and other CNS depressants that may worsen sleep apnea and disrupt normal sleep architecture. ?- Sleep hygiene should be reviewed to assess factors that may improve sleep quality. ?- Weight management and regular exercise should be initiated or continued. ?- Return to [facility] Center for re-evaluation after 4 weeks of therapy ? ? ?Kara Mead MD ?Board Certified in Sleep medicine ? ?

## 2022-03-09 ENCOUNTER — Telehealth: Payer: Self-pay | Admitting: Primary Care

## 2022-03-09 ENCOUNTER — Telehealth: Payer: Self-pay | Admitting: *Deleted

## 2022-03-09 DIAGNOSIS — G4731 Primary central sleep apnea: Secondary | ICD-10-CM

## 2022-03-09 DIAGNOSIS — G4733 Obstructive sleep apnea (adult) (pediatric): Secondary | ICD-10-CM

## 2022-03-09 DIAGNOSIS — E1142 Type 2 diabetes mellitus with diabetic polyneuropathy: Secondary | ICD-10-CM

## 2022-03-09 MED ORDER — ONETOUCH DELICA LANCETS 33G MISC: 12 refills | Status: AC

## 2022-03-09 MED ORDER — BLOOD GLUCOSE METER KIT: PACK | 0 refills | Status: DC

## 2022-03-09 MED ORDER — GLUCOSE BLOOD VI STRP
ORAL_STRIP | 12 refills | Status: DC
Start: 2022-03-09 — End: 2023-01-21

## 2022-03-09 NOTE — Telephone Encounter (Signed)
Called and spoke with pt about info from [facility] about sleep study and recommendations and she verbalized understanding. Order for cpap start has been placed. Nothing further needed. ?

## 2022-03-09 NOTE — Telephone Encounter (Signed)
Received fax from [facility] pharmacy requesting refill for [employer] tru metrix air meter, [employer] true metrix test strip, and Trueplus 33G lancets. Prentiss Hammett Zimmerman Rumple, CMA ? ?

## 2022-03-09 NOTE — Telephone Encounter (Signed)
Date of HST: 12/2021 AHI 9/h, central apneas 2/h ? ?Please let patient know Dr. Elsworth Soho he recommend auto CPAP, if residual events persistent on CPAP on central apneas emerge then consider trial BIAPAP 25/21.  ? ?Please send in order for CPAP 5-15cm h20 with small size resmed full face airfit F20 mask with heated humidity  ? ?Keep follow-up apt with Dr. Ander Slade after 4 weeks of therapy (may need to push out June visit) ?

## 2022-03-12 ENCOUNTER — Telehealth (INDEPENDENT_AMBULATORY_CARE_PROVIDER_SITE_OTHER): Payer: Medicare PPO | Admitting: Pharmacist

## 2022-03-12 ENCOUNTER — Other Ambulatory Visit: Payer: Self-pay | Admitting: Family Medicine

## 2022-03-12 ENCOUNTER — Telehealth: Payer: Self-pay | Admitting: *Deleted

## 2022-03-12 DIAGNOSIS — K219 Gastro-esophageal reflux disease without esophagitis: Secondary | ICD-10-CM

## 2022-03-12 DIAGNOSIS — Z794 Long term (current) use of insulin: Secondary | ICD-10-CM

## 2022-03-12 DIAGNOSIS — E1142 Type 2 diabetes mellitus with diabetic polyneuropathy: Secondary | ICD-10-CM | POA: Diagnosis not present

## 2022-03-12 NOTE — Assessment & Plan Note (Signed)
Diabetes: ?Diabetes longstanding currently with improved control based on home blood glucose readings.  Patient is able to verbalize appropriate hypoglycemia management plan. Medication adherence appears optimal. Control is suboptimal due to need for medication optimization and inability to access CGM.  ?-Continued basal insulin Tresiba (insulin degludec) at 45 units daily ?-Continued rapid insulin Fiasp (insulin aspart) at 15 units TID with meals.  ?-Continued GLP-1 Ozempic (semaglutide) at 2 mg weekly. Did not titrate dose due to recent GI side effects. ?-Continued SGLT2-I Farxiga (dapagliflozin) at 10 mg daily.  ?-Discontinued metformin 750 mg BID due to GI side effects and improved blood sugar control. ?-Extensively discussed pathophysiology of diabetes, recommended lifestyle interventions, dietary effects on blood sugar control.  ?-Counseled on s/sx of and management of hypoglycemia.  ? ?

## 2022-03-12 NOTE — Progress Notes (Signed)
? ? ?S:    ? ?Chief Complaint  ?Patient presents with  ? Medication Management  ?  Diabetes - GERD/reflux  ? ?Cassandra Reid is a 66 y.o. female who was contacted from our office to her home by phone.  Patient could not contact today via her computer and was willing to conduct phone encounter. PMH is significant for T2DM, HTN, GERD. Patient was referred and last seen by Primary Care Provider, Dr. Larita Fife, on 01/08/22. Patient was last seen by pharmacy team on 02/12/22.  ? ?This video was conducted via telephone.  ? ?Current diabetes medications include: metformin 750 mg ER BID, Farxiga (dapagliflozin) 10 mg daily, Fiasp (insulin aspart) 15 units TID, Tresiba (insulin degludec) 45 units daily, Ozempic (semaglutide) 2 mg weekly ?Current hypertension medications include: amlodipine 10 mg daily, lisinoril 10 mg daily ?Current hyperlipidemia medications include: atorvastatin 40 mg daily ? ?Patient reports taking all medications as prescribed. Patient reports adherence with medications. ? ?Do you feel that your medications are working for you? yes ?Have you been experiencing any side effects to the medications prescribed? Potentially - stomach issues with bloating, gagging, belching, loose stools typically in the evening. Patient reports that she has not been taking famotidine in addition to her pantoprazole. ?Patient reports stomach issues have been interfering with daily life activities.  ? ?Patient denies hypoglycemic events. ? ?Patient has previously used Dexcom and Freestyle Libre CGM and is currently awaiting Dexcom insurance issues to resolve to start using again. Patient reports she has been checking BG at home with glucometer regularly. ?Reported home fasting blood sugars: 161 yesterday morning, 142, 130-150s all day, 116.  ?Reported 2 hour post-meal/random blood sugars: 269, 224 ?Patient reports seeing numbers >200 2-3 times per week. She reports that she has not seen anything < 100.  ? ?Patient reports weight has  been stable.  ? ?Not taking famotidine - previously prescribed.  ? ? ?O: ?This visit was conducted virtually and unable to perform physical exam, ROS.  ? ?Lab Results  ?Component Value Date  ? HGBA1C 7.7 (A) 12/27/2021  ? ? ?Lipid Panel  ?   ?Component Value Date/Time  ? CHOL 120 07/31/2019 1339  ? TRIG 242 (H) 07/31/2019 1339  ? HDL 42 (L) 07/31/2019 1339  ? CHOLHDL 2.9 07/31/2019 1339  ? VLDL 38.2 07/18/2018 0940  ? LDLCALC 48 07/31/2019 1339  ? ? ?A/P: ?Diabetes: ?Diabetes longstanding currently with improved control based on home blood glucose readings.  Patient is able to verbalize appropriate hypoglycemia management plan. Medication adherence appears optimal. Control is suboptimal due to need for medication optimization and inability to access CGM.  ?-Continued basal insulin Tresiba (insulin degludec) at 45 units daily ?-Continued rapid insulin Fiasp (insulin aspart) at 15 units TID with meals.  ?-Continued GLP-1 Ozempic (semaglutide) at 2 mg weekly. Did not titrate dose due to recent GI side effects. ?-Continued SGLT2-I Farxiga (dapagliflozin) at 10 mg daily.  ?-Discontinued metformin 750 mg BID due to GI side effects and improved blood sugar control. ?-Extensively discussed pathophysiology of diabetes, recommended lifestyle interventions, dietary effects on blood sugar control.  ?-Counseled on s/sx of and management of hypoglycemia.  ?-Next A1c anticipated at next visit. A1c 9.9% on 12/09/2020.  ? ?GERD: ?Currently uncontrolled with symptoms that could be medication related.   ?- Counseled patient to RESTART taking famotidine 20 mg daily in addition to pantoprazole 40 mg daily. Advised that famotidine could be taken BID if need for more symptom resolution with only once daily dosing.  ?-  Suggested trial OFF of metformin to evaluate impact on GI symptoms.  ?Patient has GI specialist follow-up in the next month.  ? ?Written patient instructions provided. Patient verbalized understanding of treatment plan. Total  time in phone call counseling 31 minutes.   ? ?Follow up PCP clinic visit in 1 month. Patient seen with Bartolo Darter PharmD Candidate. ? ?

## 2022-03-12 NOTE — Progress Notes (Signed)
Reviewed: Agree with Dr. Koval's documentation and management. 

## 2022-03-12 NOTE — Telephone Encounter (Signed)
Refill request for BD SIngle use swab, did not see on current med list. Yeva Bissette Lamonte Sakai, CMA ? ?

## 2022-03-12 NOTE — Assessment & Plan Note (Signed)
Currently uncontrolled with symptoms that could be medication related.   ?- Counseled patient to RESTART taking famotidine 20 mg daily in addition to pantoprazole 40 mg daily. Advised that famotidine could be taken BID if need for more symptom resolution with only once daily dosing.  ?- Suggested trial OFF of metformin to evaluate impact on GI symptoms.  ?Patient has GI specialist follow-up in the next month.  ?

## 2022-03-12 NOTE — Patient Instructions (Signed)
Diabetes: ? ?-Discontinued metformin 750 mg BID due to GI side effects and improved blood sugar control. ? ? ?GERD: ?- Counseled patient to RESTART taking famotidine 20 mg daily in addition to pantoprazole 40 mg daily. Advised that famotidine could be taken BID if need for more symptom resolution with only once daily dosing.  ?- Suggested trial OFF of metformin to evaluate impact on GI symptoms.  ? ?

## 2022-03-13 MED ORDER — BD SWAB SINGLE USE REGULAR PADS: MEDICATED_PAD | 12 refills | Status: DC

## 2022-03-13 NOTE — Addendum Note (Signed)
Addended by: Valetta Close on: 03/13/2022 03:31 PM ? ? Modules accepted: Orders ? ?

## 2022-03-13 NOTE — Telephone Encounter (Signed)
Ordered.  ?Fayette Pho, MD ? ?

## 2022-03-16 DIAGNOSIS — E1142 Type 2 diabetes mellitus with diabetic polyneuropathy: Secondary | ICD-10-CM | POA: Diagnosis not present

## 2022-03-19 ENCOUNTER — Ambulatory Visit (INDEPENDENT_AMBULATORY_CARE_PROVIDER_SITE_OTHER): Payer: Medicare PPO

## 2022-03-19 VITALS — Ht 61.0 in | Wt 174.0 lb

## 2022-03-19 DIAGNOSIS — Z0000 Encounter for general adult medical examination without abnormal findings: Secondary | ICD-10-CM

## 2022-03-19 NOTE — Progress Notes (Signed)
? ?Subjective:  ? Cassandra Reid is a 66 y.o. female who presents for Medicare Annual (Subsequent) preventive examination. ? ?Patient consented to have virtual visit and was identified by name and date of birth. ?Method of visit: Telephone ? ?Encounter participants: ?Patient: Cassandra Reid - located at Home  ?Nurse/Provider: Dorna Reid - located at [facility] ?Others (if applicable): Na ? ?Review of Systems: Defer to PCP.  ? ?Cardiac Risk Factors include: advanced age (>70men, >68 women);diabetes mellitus;hypertension;obesity (BMI >30kg/m2) ? ?Objective:  ? ?Vitals: Ht $RemoveBef'5\' 1"'tIPVVXmOHZ$  (1.549 m)   Wt 174 lb (78.9 kg)   BMI 32.88 kg/m?   Body mass index is 32.88 kg/m?. ? ? ?  03/19/2022  ? 12:57 PM 03/19/2022  ? 12:44 PM 03/05/2022  ?  8:48 PM 02/02/2022  ?  8:54 AM 01/08/2022  ?  2:11 PM 10/31/2021  ?  6:20 PM 10/15/2021  ?  1:38 PM  ?Advanced Directives  ?Does Patient Have a Medical Advance Directive?  Yes Yes Yes No Yes Yes  ?Type of Corporate treasurer of [name];Living will [name];Living will [name];Living will   [name];Living will  ?Does patient want to make changes to medical advance directive?   No - Patient declined   No - Patient declined   ?Copy of [place] in Chart? Yes - validated most recent copy scanned in chart (See row information) No - copy requested No - copy requested      ?Would patient like information on creating a medical advance directive?     No - Patient declined    ? ?Tobacco ?Social History  ? ?Tobacco Use  ?Smoking Status Former  ? Packs/day: 1.00  ? Years: 3.00  ? Pack years: 3.00  ? Types: Cigarettes  ? Quit date: 06/1967  ? Years since quitting: 54.7  ? Passive exposure: Past  ?Smokeless Tobacco Never  ?   ?Counseling given: [name] former smoker. ? ?Clinical Intake: ? ?Pre-visit preparation completed: Yes ? ?Pain Score: 8  ? ?Nutritional Status: BMI > 30  Obese ? ?How often do you need to have someone  help you when you read instructions, pamphlets, or other written materials from your doctor or pharmacy?: 1 - Never ?What is the last grade level you completed in school?: College ? ?Diabetic: Yes  ?Past Medical History:  ?Diagnosis Date  ? Allergic rhinitis 09/26/2018  ? Anosmia   ? Arthritis   ? Asthma   ? Balance problems 09/22/2021  ? C. difficile colitis 07/18/2018  ? Cerebral aneurysm without rupture   ? Chest pain 03/26/2020  ? Chronic pain syndrome 04/23/2019  ? CKD stage 3 due to type 2 diabetes mellitus 04/15/2019  ? DDD (degenerative disc disease), cervical 04/16/2019  ? Encephalomalacia on imaging study 08/25/2020  ? Brain MRI 08/25/21: Few scattered areas of encephalomalacia and gliosis within the overlying left frontal lobe most likely postoperative in nature.  ? Fatty liver 05/31/2020  ? Fibromyalgia   ? Former smoker   ? Generalized anxiety disorder   ? GERD (gastroesophageal reflux disease)   ? Heart murmur   ? History of chicken pox   ? History of colon polyps   ? Hyperlipidemia associated with type 2 diabetes mellitus 07/18/2018  ? Hypertension associated with diabetes 07/18/2018  ? IBS (irritable bowel syndrome)   ? Incontinence 09/22/2021  ? Bowel and bladder.   ? Ischemic brain injury 05/29/2021  ? 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  Brain MRI 08/25/21: Few small remote lacunar infarcts noted at the right caudate and left internal capsule  ? Legally blind   ? Major depressive disorder   ? Migraines   ? Myalgia 04/23/2019  ? OSA (obstructive sleep apnea) 07/18/2018  ? not using CPAP machine (broken)  ? Osteoporosis 01/18/2021  ? Paroxysmal atrial tachycardia   ? On telemetry  ? Retention cyst of nasal sinus 08/25/2020  ? Brain MRI 08/25/20: Large retention cyst largely fills the left maxillary sinus.  ? Type 2 diabetes mellitus with diabetic polyneuropathy, with long-term current use of insulin 07/18/2018  ? ?Past Surgical History:  ?Procedure Laterality Date  ? ABDOMINAL HYSTERECTOMY  2012  ?  APPENDECTOMY  1972  ? CEREBRAL ANEURYSM REPAIR Left 2004  ? Aneurysm clip in place near the level of the left ICA on Brain MRI 08/2020. Also coiling performed  ? CHOLECYSTECTOMY  2015  ? CRANIOTOMY Left   ? left pterional craniotomy  ? MENISCUS REPAIR Bilateral   ? ?Family History  ?Problem Relation Age of Onset  ? Alcohol abuse Mother   ? Diabetes Mother   ? Hypertension Mother   ? Kidney disease Mother   ? COPD Father   ? Diabetes Father   ? Early death Father   ? Heart disease Father   ? Hyperlipidemia Father   ? Hypertension Father   ? Diabetes Sister   ? Hypertension Sister   ? Breast cancer Sister   ? Diabetes Sister   ? Hypertension Sister   ? Kidney disease Sister   ? Cancer Maternal Grandmother   ? Alcohol abuse Maternal Grandfather   ? Cancer Maternal Grandfather   ? Cancer Paternal Grandmother   ? Cancer Paternal Grandfather   ? ?Social History  ? ?Socioeconomic History  ? Marital status: Legally Separated  ?  Spouse name: [name]  ? Number of children: 3  ? Years of education: 53  ? Highest education level: Associate degree: academic program  ?Occupational History  ? Occupation: disabled  ?Tobacco Use  ? Smoking status: Former  ?  Packs/day: 1.00  ?  Years: 3.00  ?  Pack years: 3.00  ?  Types: Cigarettes  ?  Quit date: 06/1967  ?  Years since quitting: 54.7  ?  Passive exposure: Past  ? Smokeless tobacco: Never  ?Vaping Use  ? Vaping Use: Never used  ?Substance and Sexual Activity  ? Alcohol use: Never  ? Drug use: Not Currently  ? Sexual activity: Not Currently  ?Other Topics Concern  ? Not on file  ?Social History Narrative  ? Patient lives in [place].   ? Patient walks her dog daily for exercise.   ? Patient is separated from her husband.  ? Patient does not drive. Uses home delivery services and ubers.   ? ?Social Determinants of Health  ? ?Financial Resource Strain: Low Risk   ? Difficulty of Paying Living Expenses: Not hard at all  ?Food Insecurity: No Food Insecurity  ? Worried About Ship broker in the Last Year: Never true  ? Ran Out of Food in the Last Year: Never true  ?Transportation Needs: No Transportation Needs  ? Lack of Transportation (Medical): No  ? Lack of Transportation (Non-Medical): No  ?Physical Activity: Sufficiently Active  ? Days of Exercise per Week: 7 days  ? Minutes of Exercise per Session: 30 min  ?Stress: Stress Concern Present  ? Feeling of Stress : Very much  ?Social Connections: Socially Isolated  ?  Frequency of Communication with Friends and Family: Once a week  ? Frequency of Social Gatherings with Friends and Family: Once a week  ? Attends Religious Services: Never  ? Active Member of Clubs or Organizations: No  ? Attends Archivist Meetings: Never  ? Marital Status: Separated  ? ?Outpatient Encounter Medications as of 03/19/2022  ?Medication Sig  ? Alcohol Swabs (B-D SINGLE USE SWABS REGULAR) PADS Use as instructed with blood sugar checks.  ? amLODipine (NORVASC) 10 MG tablet TAKE 1 TABLET BY MOUTH EVERY DAY  ? Ascorbic Acid (VITAMIN C ADULT GUMMIES PO) Take 1 each by mouth daily.  ? atorvastatin (LIPITOR) 40 MG tablet TAKE 1 TABLET BY MOUTH EVERYDAY AT BEDTIME  ? blood glucose meter kit and supplies Dispense based on patient and insurance preference. Use up to four times daily as directed. (FOR ICD-10 E10.9, E11.9).  ? Continuous Blood Gluc Receiver (DEXCOM G6 RECEIVER) DEVI Please use as directed to check blood sugar.  ? Continuous Blood Gluc Sensor (DEXCOM G6 SENSOR) MISC Use as directed to check blood sugar.  ? Continuous Blood Gluc Transmit (DEXCOM G6 TRANSMITTER) MISC Use as directed to check blood sugar.  ? dapagliflozin propanediol (FARXIGA) 10 MG TABS tablet Take 1 tablet (10 mg total) by mouth daily before breakfast. This is a patient assistance medication. Patient may not be approved and/or have medication. Please ask and verify when performing med review.  ? famotidine (PEPCID) 20 MG tablet TAKE 1 TABLET BY MOUTH TWICE A DAY  ? fluticasone (FLONASE)  50 MCG/ACT nasal spray Place 1 spray into both nostrils daily as needed for allergies or rhinitis.  ? furosemide (LASIX) 40 MG tablet Take 1 tablet (40 mg total) by mouth daily.  ? gabapentin (NEURONTIN) 600 MG

## 2022-03-19 NOTE — Patient Instructions (Addendum)
Amaani ?Thank you for taking time to come for your Medicare Wellness Visit. I appreciate your ongoing commitment to your health goals. Please review the following plan we discussed and let me know if I can assist you in the future.  ?  ?These are the goals we discussed: ? ? Goals   ? ?  Coping Skills Enhanced by connecting with mental health provider   ?  Patient Goals/Self-Care Activities: Over the next 90 days ?Continue to work with [employer] for counseling  ?Continue taking Lexapro ?Review your EMMI Education on Relieving Stress and Getting a Good night sleep ?I have sent new EMMI education on  Movement: Emotional Health; Managing Anxiety around daily task ?Continue to connect to your support group ?Start [place] program  ?  ?  HEMOGLOBIN A1C < 7   ?  7.26 December 2021 ?  ? ?  ? ?We also discussed recommended health maintenance. As discussed, you are due for: ?Health Maintenance  ?Topic Date Due  ? COVID-19 Vaccine (5 - Booster for Pfizer series) 05/31/2021  ? FOOT EXAM  02/22/2022  ? Zoster Vaccines- Shingrix (2 of 2) 03/20/2022  ? INFLUENZA VACCINE  06/19/2022  ? HEMOGLOBIN A1C  06/26/2022  ? MAMMOGRAM  03/31/2023  ? COLONOSCOPY (Pts 45-9yrs Insurance coverage will need to be confirmed)  01/02/2026  ? TETANUS/TDAP  01/23/2026  ? Hepatitis C Screening  Completed  ? HIV Screening  Completed  ? HPV VACCINES  Aged Out  ? OPHTHALMOLOGY EXAM  Discontinued  ? ?PCP apt scheduled for 04/03/2022. ?Plan to get bivalent booster at this apt.  ?#2 Shingles due 03/20/2022.  ?Mammogram scheduled for 04/03/2022. ? ?Preventive Care 5-57 Years Old, Female ?Preventive care refers to lifestyle choices and visits with your health care provider that can promote health and wellness. Preventive care visits are also called wellness exams. ?What can I expect for my preventive care visit? ?Counseling ?Your health care provider may ask you questions about your: ?Medical history, including: ?Past medical  problems. ?Family medical history. ?Pregnancy history. ?Current health, including: ?Menstrual cycle. ?Method of birth control. ?Emotional well-being. ?Home life and relationship well-being. ?Sexual activity and sexual health. ?Lifestyle, including: ?Alcohol, nicotine or tobacco, and drug use. ?Access to firearms. ?Diet, exercise, and sleep habits. ?Work and work Statistician. ?Sunscreen use. ?Safety issues such as seatbelt and bike helmet use. ?Physical exam ?Your health care provider will check your: ?Height and weight. These may be used to calculate your BMI (body mass index). BMI is a measurement that tells if you are at a healthy weight. ?Waist circumference. This measures the distance around your waistline. This measurement also tells if you are at a healthy weight and may help predict your risk of certain diseases, such as type 2 diabetes and high blood pressure. ?Heart rate and blood pressure. ?Body temperature. ?Skin for abnormal spots. ?What immunizations do I need? ? ?Vaccines are usually given at various ages, according to a schedule. Your health care provider will recommend vaccines for you based on your age, medical history, and lifestyle or other factors, such as travel or where you work. ?What tests do I need? ?Screening ?Your health care provider may recommend screening tests for certain conditions. This may include: ?Lipid and cholesterol levels. ?Diabetes screening. This is done by checking your blood sugar (glucose) after you have not eaten for a while (fasting). ?Pelvic exam and Pap test. ?Hepatitis B test. ?Hepatitis C test. ?HIV (human immunodeficiency virus) test. ?STI (sexually transmitted infection) testing, if  you are at risk. ?Lung cancer screening. ?Colorectal cancer screening. ?Mammogram. Talk with your health care provider about when you should start having regular mammograms. This may depend on whether you have a family history of breast cancer. ?BRCA-related cancer screening. This may  be done if you have a family history of breast, ovarian, tubal, or peritoneal cancers. ?Bone density scan. This is done to screen for osteoporosis. ?Talk with your health care provider about your test results, treatment options, and if necessary, the need for more tests. ?Follow these instructions at home: ?Eating and drinking ? ?Eat a diet that includes fresh fruits and vegetables, whole grains, lean protein, and low-fat dairy products. ?Take vitamin and mineral supplements as recommended by your health care provider. ?Do not drink alcohol if: ?Your health care provider tells you not to drink. ?You are pregnant, may be pregnant, or are planning to become pregnant. ?If you drink alcohol: ?Limit how much you have to 0-1 drink a day. ?Know how much alcohol is in your drink. In the [place] one drink equals one 12 oz bottle of beer (355 mL), one 5 oz glass of wine (148 mL), or one 1? oz glass of hard liquor (44 mL). ?Lifestyle ?Brush your teeth every morning and night with fluoride toothpaste. Floss one time each day. ?Exercise for at least 30 minutes 5 or more days each week. ?Do not use any products that contain nicotine or tobacco. These products include cigarettes, chewing tobacco, and vaping devices, such as e-cigarettes. If you need help quitting, ask your health care provider. ?Do not use drugs. ?If you are sexually active, practice safe sex. Use a condom or other form of protection to prevent STIs. ?If you do not wish to become pregnant, use a form of birth control. If you plan to become pregnant, see your health care provider for a prepregnancy visit. ?Take aspirin only as told by your health care provider. Make sure that you understand how much to take and what form to take. Work with your health care provider to find out whether it is safe and beneficial for you to take aspirin daily. ?Find healthy ways to manage stress, such as: ?Meditation, yoga, or listening to music. ?Journaling. ?Talking to a trusted  person. ?Spending time with friends and family. ?Minimize exposure to UV radiation to reduce your risk of skin cancer. ?Safety ?Always wear your seat belt while driving or riding in a vehicle. ?Do not drive: ?If you have been drinking alcohol. Do not ride with someone who has been drinking. ?When you are tired or distracted. ?While texting. ?If you have been using any mind-altering substances or drugs. ?Wear a helmet and other protective equipment during sports activities. ?If you have firearms in your house, make sure you follow all gun safety procedures. ?Seek help if you have been physically or sexually abused. ?What's next? ?Visit your health care provider once a year for an annual wellness visit. ?Ask your health care provider how often you should have your eyes and teeth checked. ?Stay up to date on all vaccines. ?This information is not intended to replace advice given to you by your health care provider. Make sure you discuss any questions you have with your health care provider. ?Document Revised: 05/03/2021 Document Reviewed: 05/03/2021 ?Elsevier Patient Education ? [place]. ? ?Fall Prevention in the Home, Adult ?Falls can cause injuries and can happen to people of all ages. There are many things you can do to make your home safe and to help  prevent falls. Ask for help when making these changes. ?What actions can I take to prevent falls? ?General Instructions ?Use good lighting in all rooms. Replace any light bulbs that burn out. ?Turn on the lights in dark areas. Use night-lights. ?Keep items that you use often in easy-to-reach places. Lower the shelves around your home if needed. ?Set up your furniture so you have a clear path. Avoid moving your furniture around. ?Do not have throw rugs or other things on the floor that can make you trip. ?Avoid walking on wet floors. ?If any of your floors are uneven, fix them. ?Add color or contrast paint or tape to clearly mark and help you see: ?Grab bars  or handrails. ?First and last steps of staircases. ?Where the edge of each step is. ?If you use a stepladder: ?Make sure that it is fully opened. Do not climb a closed stepladder. ?Make sure the sides of the stepla

## 2022-03-20 NOTE — Progress Notes (Signed)
I have reviewed this visit and agree with the documentation.   

## 2022-03-23 DIAGNOSIS — H25812 Combined forms of age-related cataract, left eye: Secondary | ICD-10-CM | POA: Diagnosis not present

## 2022-03-29 ENCOUNTER — Encounter (INDEPENDENT_AMBULATORY_CARE_PROVIDER_SITE_OTHER): Payer: Self-pay

## 2022-03-30 ENCOUNTER — Other Ambulatory Visit: Payer: Self-pay | Admitting: Family Medicine

## 2022-03-30 MED ORDER — PANTOPRAZOLE SODIUM 40 MG PO TBEC: 40.0000 mg | DELAYED_RELEASE_TABLET | Freq: Every day | ORAL | 0 refills | Status: DC

## 2022-04-02 ENCOUNTER — Ambulatory Visit (INDEPENDENT_AMBULATORY_CARE_PROVIDER_SITE_OTHER): Payer: Medicare PPO | Admitting: Psychologist

## 2022-04-02 DIAGNOSIS — F331 Major depressive disorder, recurrent, moderate: Secondary | ICD-10-CM | POA: Diagnosis not present

## 2022-04-02 DIAGNOSIS — F411 Generalized anxiety disorder: Secondary | ICD-10-CM | POA: Diagnosis not present

## 2022-04-02 NOTE — Progress Notes (Signed)
[facility] Counselor/Therapist Progress Note ? ?Patient ID: Cassandra Reid, MRN: [id number],   ? ?Date: 04/02/2022 ? ?Time Spent: 11:05 am to 11:28 am; total time: 23 minutes ? ? This session was held via phone teletherapy due to the coronavirus risk at this time. The patient consented to phone teletherapy and was located at her home during this session. She is aware it is the responsibility of the patient to secure confidentiality on her end of the session. The provider was in a private home office for the duration of this session. Limits of confidentiality were discussed with the patient.  ? ?Treatment Type: Individual Therapy ? ?Reported Symptoms: Depression and stress ? ?Mental Status Exam: ?Appearance:  NA     ?Behavior: Appropriate  ?Motor: Normal  ?Speech/Language:  Clear and Coherent  ?Affect: Appropriate  ?Mood: normal  ?Thought process: normal  ?Thought content:   WNL  ?Sensory/Perceptual disturbances:   WNL  ?Orientation: oriented to person, place, and time/date  ?Attention: Good  ?Concentration: Good  ?Memory: WNL  ?Fund of knowledge:  Fair  ?Insight:   Fair  ?Judgment:  Fair  ?Impulse Control: Good  ? ?Risk Assessment: ?Danger to Self:  No ?Self-injurious Behavior: No ?Danger to Others: No ?Duty to Warn:no ?Physical Aggression / Violence:No  ?Access to Firearms a concern: No  ?Gang Involvement:No  ? ?Subjective: Beginning the session, patient described herself as doing poorly as her husband who she is separated from is serving her divorce papers, which she does not want. She voiced feeling overwhelmed by the idea of court, getting a lawyer, and going through a divorce as it will limit her social circle even more. She then voiced that her dog has a lump on his throat and that she is hesitant to contact the vet for what it may mean. She stated that she wanted to explore lawyer options and briefly talked about options before abruptly ending the session. She denied suicidal and homicidal  ideation.   ? ?Interventions:  Worked on developing a therapeutic relationship with the patient using active listening and reflective statements. Provided emotional support using empathy and validation. Normalized and validated the expressed thoughts and emotions that the patient expressed. Reflected and processed those emotions. Validated the grief that the patient was experiencing. Used socratic questions to assist the patient gain insight into self. Attempted to assist with problem solving. Identified goals for the session. Explored ways that patient could get questions answered related to the court situation. Processed the different family dynamics and attempted to explore how patient could get social support from others. Assigned homework. Assessed for suicidal and homicidal ideation.  ? ?Homework: Reach out to lawyers ? ?Next Session: Review homework, coping skills, and emotional support ? 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 ?Plan:  ? ?Goals ?Work through the grieving process and face reality of own death ?Accept emotional support from others around them ?Live life to the fullest, event though time may be limited ?Become as knowledgeable about the medical condition  ?Reduce fear, anxiety about the health condition  ?Accept the illness ?Accept the role of psychological and behavioral factors  ?Stabilize anxiety level wile increasing ability to function ?Learn and implement coping skills that result in a reduction of anxiety  ?Alleviate depressive symptoms ?Recognize, accept, and cope with depressive feelings ?Develop healthy thinking patterns ?Develop healthy interpersonal relationships ? ?Objectives target date for all objectives is 02/27/2023 ?Identify feelings associated with the illness ?Family members share  with each other feelings ?Identify the losses or limitations that have been experienced ?Verbalize acceptance of the  reality of the medical condition ?Commit to learning and implement a proactive approach to managing personal stresses ?Verbalize an understanding of the medical condition ?Work with therapist to develop a plan for coping with stress ?Learn and implement skills for managing stress ?Engage in social, productive activities that are possible ?Engage in faith based activities ?implement positive imagery ?Identify coping skills and sources of emotional support ?Patient's partner and family members verbalize their fears regarding severity of health condition ?Identify sources of emotional distress  ?Learning and implement calming skills to reduce overall anxiety ?Learn and implement problem solving strategies ?Identify and engage in pleasant activities ?Learning and implement personal and interpersonal skills to reduce anxiety and improve interpersonal relationships ?Learn to accept limitations in life and commit to tolerating, rather than avoiding, unpleasant emotions while accomplishing meaningful goals ?Identify major life conflicts from the past and present that form the basis for present anxiety ?Learn and implement behavioral strategies ?Verbalize an understanding and resolution of current interpersonal problems ?Learn and implement problem solving and decision making skills ?Learn and implement conflict resolution skills to resolve interpersonal problems ?Verbalize an understanding of healthy and unhealthy emotions verbalize insight into how past relationships may be influence current experiences with depression ?Use mindfulness and acceptance strategies and increase value based behavior  ?Increase hopeful statements about the future.  ? ?Interventions ?Teach about stress and ways to handle stress ?Assist the patient in developing a coping action plan for stressors ?Conduct skills based training for coping strategies ?Train problem focused skills ?Sort out what activities the individual can do ?Encourage patient to  rely upon his/her spiritual faith ?Teach the patient to use guided imagery ?Probe and evaluate family's ability to provide emotional support ?Allow family to share their fears ?Assist the patient in identifying, sorting through, and verbalizing the various feelings generated by his/her medical condition ?Meet with family members  ?Ask patient list out limitations  ?Use stress inoculation training  ?Use Acceptance and Commitment Therapy to help client accept uncomfortable realities in order to accomplish value-consistent goals ?Reinforce the client's insight into the role of his/her past emotional pain and present anxiety  ?Discuss examples demonstrating that unrealistic worry overestimates the probability of threats and underestimate patient's ability  ?Assist the patient in analyzing his or her worries ?Help patient understand that avoidance is reinforcing  ?Behavioral activation help the client explore the relationship, nature of the dispute,  ?Help the client develop new interpersonal skills and relationships ?Conduct Problem so living therapy ?Teach conflict resolution skills ?Use a process-experiential approach ?Conduct TLDP ?Conduct ACT ? ?The patient and clinician reviewed the treatment plan on 03/06/2022. The patient approved of the treatment plan.  ? ?Hilbert Corrigan, PsyD ? ?

## 2022-04-03 ENCOUNTER — Encounter: Payer: Self-pay | Admitting: Family Medicine

## 2022-04-03 ENCOUNTER — Ambulatory Visit
Admission: RE | Admit: 2022-04-03 | Discharge: 2022-04-03 | Disposition: A | Discharge status: Home / Self Care | Payer: Medicare PPO | Source: Ambulatory Visit | Attending: Osteopathic Medicine | Admitting: Osteopathic Medicine

## 2022-04-03 ENCOUNTER — Telehealth (INDEPENDENT_AMBULATORY_CARE_PROVIDER_SITE_OTHER): Payer: Medicare PPO | Admitting: Family Medicine

## 2022-04-03 ENCOUNTER — Telehealth: Payer: Self-pay | Admitting: *Deleted

## 2022-04-03 DIAGNOSIS — M25512 Pain in left shoulder: Secondary | ICD-10-CM | POA: Insufficient documentation

## 2022-04-03 DIAGNOSIS — Z1231 Encounter for screening mammogram for malignant neoplasm of breast: Secondary | ICD-10-CM | POA: Diagnosis not present

## 2022-04-03 HISTORY — DX: Pain in left shoulder: M25.512

## 2022-04-03 NOTE — Telephone Encounter (Signed)
LVM for pt to call office back to go over pre-visit questions prior to my chart video visit for appointment today. Cassandra Reid, CMA ? ?

## 2022-04-03 NOTE — Progress Notes (Signed)
Virtual Visit via Video Note ? ?I connected with Cassandra Reid on 04/03/22 at  1:30 PM EDT by a video enabled telemedicine application and verified that I am speaking with the correct person using two identifiers. ? ?Location: ?Patient: Home ?Provider: [facility] ?  ?I discussed the limitations of evaluation and management by telemedicine and the availability of in person appointments. The patient expressed understanding and agreed to proceed. ? ?History of Present Illness: ?Anterior left shoulder over last several weeks. Gradually worsening. Patient can point with one finger to painful spot over anterior shoulder. Pain with posterior arm extension. No inciting trauma or incidents. No relieving or aggravating factors.  Patient also reports left hand weakness, left forearm pain, and difficulty with left finger fine motor skills.  All of these secondary symptoms are intermittent and not related to use of arm.  Patient goes on to say that her right hand also has some difficulty with intermittent weakness and numbness/tingling. ?  ?Observations/Objective: ?Gen: awake, alert, no acute distress ?Respiratory: no respiratory distress, normal work of breathing, speaking in full sentences ?Extremities: full ROM of left shoulder, can adduct past 90 degrees without difficulty, supination/pronation without pain ? ?Assessment and Plan: ?Concern for ligamentous injury of anterior rotator cuff, specifically tear of pec minor/major insertion or biceps insertion. Discussed with patient coming to clinic for more thorough physical exam vs sports med referral to ultrasound, patient prefers to go straight to sports med.  ? ?Clinic return precautions given. ? ?Do not believe we need neuro nerve conduction study for hands based on bilaterality and intermittent nature. Will return to nerve study if persistent or worsening.  ?  ?I discussed the assessment and treatment plan with the patient. The patient was provided an opportunity to ask  questions and all were answered. The patient agreed with the plan and demonstrated an understanding of the instructions. ?  ?The patient was advised to call back or seek an in-person evaluation if the symptoms worsen or if the condition fails to improve as anticipated. ? ?I provided 15 minutes of non-face-to-face time during this encounter. ? ? ?Fayette Pho, MD ?

## 2022-04-05 ENCOUNTER — Encounter: Payer: Self-pay | Admitting: Family Medicine

## 2022-04-09 ENCOUNTER — Ambulatory Visit: Payer: Medicare PPO | Admitting: Family Medicine

## 2022-04-09 VITALS — BP 134/72 | Ht 61.0 in | Wt 173.0 lb

## 2022-04-09 DIAGNOSIS — M7542 Impingement syndrome of left shoulder: Secondary | ICD-10-CM | POA: Diagnosis not present

## 2022-04-09 NOTE — Patient Instructions (Signed)
You have rotator cuff impingement Try to avoid painful activities (overhead activities, lifting with extended arm) as much as possible. Topical voltaren gel up to 4 times a day. Can take tylenol in addition to this. Subacromial injection may be beneficial to help with pain and to decrease inflammation. Consider physical therapy with transition to home exercise program. Do home exercise program with theraband and scapular stabilization exercises daily 3 sets of 10 once a day. If not improving at follow-up we will consider imaging, injection, physical therapy, and/or nitro patches. Follow up with me in 6 weeks.

## 2022-04-09 NOTE — Progress Notes (Unsigned)
PCP: Ezequiel Essex, MD  Subjective:   HPI: Patient is a 66 y.o. female here for left shoulder pain.  L Shoulder Pain Symptoms first started ~6 months ago. They were localized to her L shoulder and only occurred when she moved a certain way (particularly reaching up and back). Now the pain goes down into her arm. Mostly located in anterolateral aspect of the shoulder and arm. Still only occurs with certain movements. Patient has been using a heating pad daily which she finds helpful. She notes she takes gabapentin for cervical radiculopathy but that doesn't seem to help much with her shoulder pain. There was no preceding injury or trauma. Patient is right hand dominant. Of note, she has fibromyalgia and chronic pain syndrome so she finds it hard to differentiate her various pains.   Past Medical History:  Diagnosis Date   Allergic rhinitis 09/26/2018   Anosmia    Arthritis    Asthma    Balance problems 09/22/2021   C. difficile colitis 07/18/2018   Cerebral aneurysm without rupture    Chest pain 03/26/2020   Chronic pain syndrome 04/23/2019   CKD stage 3 due to type 2 diabetes mellitus 04/15/2019   DDD (degenerative disc disease), cervical 04/16/2019   Encephalomalacia on imaging study 08/25/2020   Brain MRI 08/25/21: Few scattered areas of encephalomalacia and gliosis within the overlying left frontal lobe most likely postoperative in nature.   Fatty liver 05/31/2020   Fibromyalgia    Former smoker    Generalized anxiety disorder    GERD (gastroesophageal reflux disease)    Heart murmur    History of chicken pox    History of colon polyps    Hyperlipidemia associated with type 2 diabetes mellitus 07/18/2018   Hypertension associated with diabetes 07/18/2018   IBS (irritable bowel syndrome)    Incontinence 09/22/2021   Bowel and bladder.    Ischemic brain injury 05/29/2021   Lacunar infarction 05/25/2021   Brain MRI 08/25/21: Few small remote lacunar infarcts noted at the  right caudate and left internal capsule   Legally blind    Major depressive disorder    Migraines    Myalgia 04/23/2019   OSA (obstructive sleep apnea) 07/18/2018   not using CPAP machine (broken)   Osteoporosis 01/18/2021   Paroxysmal atrial tachycardia    On telemetry   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   Type 2 diabetes mellitus with diabetic polyneuropathy, with long-term current use of insulin 07/18/2018    Current Outpatient Medications on File Prior to Visit  Medication Sig Dispense Refill   Alcohol Swabs (B-D SINGLE USE SWABS REGULAR) PADS Use as instructed with blood sugar checks. 100 each 12   amLODipine (NORVASC) 10 MG tablet TAKE 1 TABLET BY MOUTH EVERY DAY 90 tablet 3   Ascorbic Acid (VITAMIN C ADULT GUMMIES PO) Take 1 each by mouth daily.     atorvastatin (LIPITOR) 40 MG tablet TAKE 1 TABLET BY MOUTH EVERYDAY AT BEDTIME 90 tablet 1   baclofen (LIORESAL) 10 MG tablet TAKE 1 TABLET BY MOUTH TWICE A DAY AS NEEDED FOR MUSCLE SPASM (Patient not taking: Reported on 03/12/2022) 180 tablet 1   BD PEN NEEDLE NANO 2ND GEN 32G X 4 MM MISC USE WITH INSULIN (Patient not taking: Reported on 03/19/2022) 100 each 4   blood glucose meter kit and supplies Dispense based on patient and insurance preference. Use up to four times daily  as directed. (FOR ICD-10 E10.9, E11.9). 1 each 0   Continuous Blood Gluc Receiver ([place]) DEVI Please use as directed to check blood sugar. 1 each 12   Continuous Blood Gluc Sensor (DEXCOM G6 SENSOR) MISC Use as directed to check blood sugar. 4 each 11   Continuous Blood Gluc Transmit (DEXCOM G6 TRANSMITTER) MISC Use as directed to check blood sugar. 1 each 12   dapagliflozin propanediol (FARXIGA) 10 MG TABS tablet Take 1 tablet (10 mg total) by mouth daily before breakfast. This is a patient assistance medication. Patient may not be approved and/or have medication.  Please ask and verify when performing med review. 30 tablet 0   dicyclomine (BENTYL) 20 MG tablet Take 1 tablet (20 mg total) by mouth 2 (two) times daily as needed for spasms. (Patient not taking: Reported on 03/12/2022) 20 tablet 0   escitalopram (LEXAPRO) 5 MG tablet TAKE 2 TABLETS BY MOUTH EVERY DAY (Patient not taking: Reported on 03/12/2022) 60 tablet 1   famotidine (PEPCID) 20 MG tablet TAKE 1 TABLET BY MOUTH TWICE A DAY 180 tablet 1   fluticasone (FLONASE) 50 MCG/ACT nasal spray Place 1 spray into both nostrils daily as needed for allergies or rhinitis. 9.9 mL 1   furosemide (LASIX) 40 MG tablet Take 1 tablet (40 mg total) by mouth daily. 90 tablet 3   gabapentin (NEURONTIN) 600 MG tablet TAKE 0.5-1 TABLET (300-600 MG TOTAL) BY MOUTH 3 (THREE) TIMES DAILY. 90 tablet 3   glucose blood test strip Use as instructed 100 each 12   insulin aspart (FIASP) 100 UNIT/ML FlexTouch Pen Inject 15 Units into the skin in the morning, at noon, and at bedtime. This is a patient assistance medication. Patient may not be approved and/or have medication. Please ask and verify when performing med review. 15 mL 0   insulin degludec (TRESIBA) 100 UNIT/ML FlexTouch Pen Inject 45 Units into the skin daily. This is a patient assistance medication. Patient may not be approved and/or have medication. Please ask and verify when performing med review.     KLOR-CON M20 20 MEQ tablet TAKE 1 TABLET BY MOUTH TWICE A DAY 180 tablet 1   lisinopril (ZESTRIL) 10 MG tablet Take 1 tablet (10 mg total) by mouth daily. 90 tablet 3   loratadine (CLARITIN) 10 MG tablet TAKE 1 TABLET EVERY DAY 90 tablet 3   LORazepam (ATIVAN) 0.5 MG tablet Take 1 tablet (0.5 mg total) by mouth every 8 (eight) hours as needed for anxiety. Start 03/22/2022 90 tablet 0   magnesium oxide (MAG-OX) 400 (241.3 Mg) MG tablet Take 1 tablet (400 mg total) by mouth 2 (two) times daily. 30 tablet 0   metFORMIN (GLUCOPHAGE-XR) 750 MG 24 hr tablet TAKE 1 TABLET (750 MG  TOTAL) BY MOUTH 2 (TWO) TIMES DAILY WITH A MEAL. (Patient not taking: Reported on 03/19/2022) 180 tablet 3   Multiple Vitamin (MULTIVITAMIN PO) Take 1 tablet by mouth daily.     ondansetron (ZOFRAN) 4 MG tablet Take 1 tablet (4 mg total) by mouth every 6 (six) hours. 12 tablet 0   OneTouch Delica Lancets 71Q MISC Please use to check blood sugar up to 4 times daily. E11.42 100 each 12   pantoprazole (PROTONIX) 40 MG tablet Take 1 tablet (40 mg total) by mouth daily. 90 tablet 0   Semaglutide, 2 MG/DOSE, (OZEMPIC, 2 MG/DOSE,) 8 MG/3ML SOPN Inject 2 mg into the skin once a week. On Friday     sucralfate (CARAFATE) 1 g  tablet TAKE 1 TABLET FOUR TIMES DAILY, WITH MEALS AND AT BEDTIME (OKAY TO TRIAL OFF AS INSTRUCTED) 240 tablet 1   traZODone (DESYREL) 50 MG tablet TAKE 1/2 TABLET BY MOUTH AT BEDTIME AS NEEDED FOR SLEEP. 45 tablet 1   No current facility-administered medications on file prior to visit.    Past Surgical History:  Procedure Laterality Date   ABDOMINAL HYSTERECTOMY  2012   APPENDECTOMY  1972   CEREBRAL ANEURYSM REPAIR Left 2004   Aneurysm clip in place near the level of the left ICA on Brain MRI 08/2020. Also coiling performed   CHOLECYSTECTOMY  2015   CRANIOTOMY Left    left pterional craniotomy   MENISCUS REPAIR Bilateral     No Known Allergies  BP 134/72   Ht $R'5\' 1"'yT$  (1.549 m)   Wt 173 lb (78.5 kg)   BMI 32.69 kg/m       Objective:  Physical Exam:  Gen: NAD, comfortable in exam room  Left Shoulder: Inspection reveals no obvious deformity, atrophy, or asymmetry. No bruising. No swelling She has areas of point tenderness located throughout shoulder and upper arm, both anteriorly and posteriorly. Of note, right shoulder exhibits similar tenderness. Full ROM in flexion, abduction, internal/external rotation.  Positive painful arc. 5/5 strength with resisted flexion at 90 degrees although this elicits pain. Negative empty can sign 5/5 strength with ER and IR Negative  neers, yergasons.  Assessment & Plan:  1. L shoulder pain-- patient's presentation consistent with rotator cuff impingement. Recommended topical voltaren gel and tylenol prn. Patient was shown exercises to perform at home. Follow up in 6 weeks- if no improvement can consider imaging vs formal PT vs injection at that time.  Alcus Dad, MD PGY-2, [place]

## 2022-04-10 ENCOUNTER — Encounter: Payer: Self-pay | Admitting: Family Medicine

## 2022-04-11 ENCOUNTER — Telehealth: Payer: Self-pay | Admitting: Pulmonary Disease

## 2022-04-11 NOTE — Telephone Encounter (Signed)
Called and spoke with patient. She wanted to check on the status of her cpap machine order. She has not heard anything from [facility]. I advised her that I could call [facility] in the morning (since it is after 5pm). She verbalized understanding.   Will hold in triage for follow up tomorrow morning.

## 2022-04-12 NOTE — Telephone Encounter (Signed)
Called and spoke with Aurther Loft at [facility]. She stated that they are still processing her cpap machine order. Per Aurther Loft, the patient has been notified about this twice.   Called patient to let her know but she did not answer. Left message for her to call us back.

## 2022-04-13 ENCOUNTER — Encounter: Payer: Medicare PPO | Attending: Physical Medicine and Rehabilitation | Admitting: Physical Medicine and Rehabilitation

## 2022-04-13 ENCOUNTER — Encounter: Payer: Self-pay | Admitting: Physical Medicine and Rehabilitation

## 2022-04-13 VITALS — BP 141/82 | HR 73 | Ht 61.0 in | Wt 180.0 lb

## 2022-04-13 DIAGNOSIS — G894 Chronic pain syndrome: Secondary | ICD-10-CM | POA: Insufficient documentation

## 2022-04-13 DIAGNOSIS — M797 Fibromyalgia: Secondary | ICD-10-CM | POA: Diagnosis not present

## 2022-04-13 DIAGNOSIS — M7918 Myalgia, other site: Secondary | ICD-10-CM | POA: Diagnosis not present

## 2022-04-13 MED ORDER — DULOXETINE HCL 30 MG PO CPEP: 30.0000 mg | ORAL_CAPSULE | Freq: Every day | ORAL | 5 refills | Status: DC

## 2022-04-13 NOTE — Progress Notes (Addendum)
Subjective:    Patient ID: ALYSHEA HUSK, female    DOB: 10-10-1956, 66 y.o.   MRN: [id number]  HPI  Pt is a 66 yr old female with hx of DM2- last A1c 7.7- prior was 9.9 Depression, New L anterior shoulder pain; GERD, HTN; HLD; With obesity and BMI of 34;  Sent here for evaluation of fibromyalgia; was dx'd 20 years ago, but thought all in her head.  Has had 2 surgeries for brain aneurysms- 1 clipping and and 1 coiling.   Also had recent cataract removal 2 weeks ago- on L- due to go back for R.   Only taken Baclofen- scared to take it- only tok 1x.  Afraid of mixing them- don't remember if helped and was dizzy, but dizzy- due to BPV.   Takes gabapentin 600 mg BID- still has pain, but thinks pain might be worse if didn't take it.    Has different pains all over- and then moves- sometimes like biting/pinching her; and then spasms, and aching and then sometimes like sticking needle in head; and chest-  Back feels like sticking needle in her. Pain moves and different types of pain-   Feels like a deep ache- like run over by bus.  Scared thinking it means something really bad.   Also exhausted- mentally and physically.  Also affects getting out  Hasn't been walking since dog died last week.  Also served with divorce papers.   Married 37 years.  Has separation agreement.    Usually takes Ativan prn- ~ 1x/week, but has taken 3x/week in last week.     Tried: Gabapentin 600 mg BID- doesn't think it helps?  Never tried Cymbalta or Lyrica- doesn't remember trying.   Social Hx: Serve with divorce papers and dog needed to be put down in last week On Disability- Doesn't drive Didn't see it coming- never had argument.   Pain Inventory: Average Pain 7 Pain Right Now 6 My pain is intermittent, constant, sharp, dull, stabbing, and aching  In the last 24 hours, has pain interfered with the following? General activity 6 Relation with others 10 Enjoyment of life 10 What TIME of  day is your pain at its worst? morning  and night Sleep (in general) Poor  Pain is worse with: unsure Pain improves with:  heat Relief from Meds: 5  walk without assistance how many minutes can you walk? 10 minutes ability to climb steps?  yes do you drive?  no Do you have any goals in this area?  yes  disabled: date disabled 2014 I need assistance with the following:  shopping Do you have any goals in this area?  yes  bladder control problems bowel control problems weakness spasms dizziness confusion depression anxiety loss of taste or smell  Any changes since last visit?  yes, maybe and MRI at [facility]  Any changes since last visit?  no    Family History  Problem Relation Age of Onset   Alcohol abuse Mother    Diabetes Mother    Hypertension Mother    Kidney disease Mother    COPD Father    Diabetes Father    Early death Father    Heart disease Father    Hyperlipidemia Father    Hypertension Father    Diabetes Sister    Hypertension Sister    Breast cancer Sister    Diabetes Sister    Hypertension Sister    Kidney disease Sister    Cancer Maternal Grandmother  Alcohol abuse Maternal Grandfather    Cancer Maternal Grandfather    Cancer Paternal Grandmother    Cancer Paternal Grandfather    Social History   Socioeconomic History   Marital status: Legally Separated    Spouse name: John   Number of children: 3   Years of education: 14   Highest education level: Associate degree: academic program  Occupational History   Occupation: disabled  Tobacco Use   Smoking status: Former    Packs/day: 1.00    Years: 3.00    Pack years: 3.00    Types: Cigarettes    Quit date: 06/1967    Years since quitting: 54.8    Passive exposure: Past   Smokeless tobacco: Never  Vaping Use   Vaping Use: Never used  Substance and Sexual Activity   Alcohol use: Never   Drug use: Not Currently   Sexual activity: Not Currently  Other Topics Concern   Not on file   Social History Narrative   Patient lives in [place].    Patient walks her dog daily for exercise.    Patient is separated from her husband.   Patient does not drive. Uses home delivery services and ubers.    Social Determinants of Health   Financial Resource Strain: Low Risk    Difficulty of Paying Living Expenses: Not hard at all  Food Insecurity: No Food Insecurity   Worried About Charity fundraiser in the Last Year: Never true   [employer] in the Last Year: Never true  Transportation Needs: No Transportation Needs   Lack of Transportation (Medical): No   Lack of Transportation (Non-Medical): No  Physical Activity: Sufficiently Active   Days of Exercise per Week: 7 days   Minutes of Exercise per Session: 30 min  Stress: Stress Concern Present   Feeling of Stress : Very much  Social Connections: Socially Isolated   Frequency of Communication with Friends and Family: Once a week   Frequency of Social Gatherings with Friends and Family: Once a week   Attends Religious Services: Never   Marine scientist or Organizations: No   Attends Archivist Meetings: Never   Marital Status: Separated   Past Surgical History:  Procedure Laterality Date   ABDOMINAL HYSTERECTOMY  2012   [place] Left 2004   Aneurysm clip in place near the level of the left ICA on Brain MRI 08/2020. Also coiling performed   CHOLECYSTECTOMY  2015   CRANIOTOMY Left    left pterional craniotomy   MENISCUS REPAIR Bilateral    Past Medical History:  Diagnosis Date   Allergic rhinitis 09/26/2018   Anosmia    Arthritis    Asthma    Balance problems 09/22/2021   C. difficile colitis 07/18/2018   Cerebral aneurysm without rupture    Chest pain 03/26/2020   Chronic pain syndrome 04/23/2019   CKD stage 3 due to type 2 diabetes mellitus 04/15/2019   DDD (degenerative disc disease), cervical 04/16/2019   Encephalomalacia on imaging study 08/25/2020    Brain MRI 08/25/21: Few scattered areas of encephalomalacia and gliosis within the overlying left frontal lobe most likely postoperative in nature.   Fatty liver 05/31/2020   Fibromyalgia    Former smoker    Generalized anxiety disorder    GERD (gastroesophageal reflux disease)    Heart murmur    History of chicken pox    History of colon polyps    Hyperlipidemia  associated with type 2 diabetes mellitus 07/18/2018   Hypertension associated with diabetes 07/18/2018   IBS (irritable bowel syndrome)    Incontinence 09/22/2021   Bowel and bladder.    Ischemic brain injury 05/29/2021   Lacunar infarction 05/25/2021   Brain MRI 08/25/21: Few small remote lacunar infarcts noted at the right caudate and left internal capsule   Legally blind    Major depressive disorder    Migraines    Myalgia 04/23/2019   OSA (obstructive sleep apnea) 07/18/2018   not using CPAP machine (broken)   Osteoporosis 01/18/2021   Paroxysmal atrial tachycardia    On telemetry   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   Type 2 diabetes mellitus with diabetic polyneuropathy, with long-term current use of insulin 07/18/2018   BP (!) 141/82   Pulse 73   Ht 5\' 1"  (1.549 m)   Wt 180 lb (81.6 kg)   SpO2 95%   BMI 34.01 kg/m   Opioid Risk Score:   Fall Risk Score:  `1  Depression screen [facility] 2/9     04/13/2022    2:55 PM 03/19/2022   12:40 PM 01/08/2022    2:12 PM 09/18/2021    3:40 PM 07/28/2021    9:47 AM 06/05/2021    1:40 PM 05/25/2021    2:17 PM  Depression screen PHQ 2/9  Decreased Interest 3 3 3  0 2 2 3   Down, Depressed, Hopeless 3 3 3  0 3 2 2   PHQ - 2 Score 6 6 6  0 5 4 5   Altered sleeping 1 1 1  0 2 3 3   Tired, decreased energy 1 1 1  0 1 2 2   Change in appetite 0 1 1 0 0 1 1  Feeling bad or failure about yourself  3 3 3  0 2 2 1   Trouble concentrating 0 1 1 0 0 1 1  Moving slowly or fidgety/restless 0 0 0 0 0 0 0   Suicidal thoughts 0 0 0 0 0 0 0  PHQ-9 Score 11 13 13  0 10 13 13   Difficult doing work/chores     Very difficult      Review of Systems  Musculoskeletal:  Positive for arthralgias, back pain and gait problem.       Pain on the skull  All other systems reviewed and are negative.     Objective:   Physical Exam  Awake, alert, appropriate, very slightly delayed on responses; slightly decreased memory; NAD Trigger points in upper traps, scalenes, levators, splenius capitus, pecs; occiput; rhomboids; thoracic and lumbar paraspinals; anterior tibialis; thenar eminence, and 1st dorsal interossei B/L  14/16 tender points as well.  Stiff posture- holding self really tightly.       Assessment & Plan:   Pt is a 66 yr old female with hx of DM2- last A1c 7.7- prior was 9.9 Depression, New L anterior shoulder pain; GERD, HTN; HLD; With obesity and BMI of 34;  Sent here for evaluation of fibromyalgia; was dx'd 20 years ago, but thought all in her head.  Meets criteria for fibromyalgia AND myofascial pain syndrome  Takes Ativan as needed- will con't to get from PCP.   2.  Dr Sharion Balloon- The survivor's Handbook to Fibromyalgia and Myofascial Pain Syndrome Theracane- 2-4 minutes on each trigger point- hold firm pressure, don't massage; can get for $20-30 online- will never need replacing- everything on [place].  Hold enough pressure  til uncomfortable- not screaming! And start with less pressure and add more over the 2+ minutes.  Tennis balls- 2-5 minutes on each trigger point- buttocks, back of thighs, calves, low and mid back- can throw in dryer x1 to make softer. Magnesium 400 mg 1-3x/day for muscle tightness- titrate up until loose stools Lidocaine patches- over the counter- 2-3 patches 12 hrs on;12 hrs off- areas of most pain- never on bottom of feet- I really like it to wrap around angle of neck on both side when you have headache.  Rolling pin- can use on calves, thighs and arms, and  buttocks- roll slowly over muscles firmly- roll towards heart  6.   Duloxetine /Cymbalta 30 mg nightly x 1 week  Then 60 mg nightly- for nerve pain 1% of patients can have nausea with Duloxetine- call me if needs an anti-nausea medicine. Can also cause mild dry mouth/dry eyes and mild constipation.  7.  Please continue Gabapentin while on Duloxetine.   8.  Wait on tramadol- and not taking Trazodone- so not interference with duloxetine.   9. Will put on wait list for trigger point injections- and also do at next appointment  10. F/U in 3 months- call me in 3-4 weeks to let me know how things going- mychart or call.   11. Needs ot get back to walking- encourage pool time.  No weights until do trigger point injections.   I spent a total of   48 minutes on total care today- >50% coordination of care- due to education on FMS and Myofascial pain syndrome and that it's not in her head- it's real and how to treat muscle pain/nerve pain.

## 2022-04-13 NOTE — Patient Instructions (Addendum)
Pt is a 66 yr old female with hx of DM2- last A1c 7.7- prior was 9.9 Depression, New L anterior shoulder pain; GERD, HTN; HLD; With obesity and BMI of 34;  Sent here for evaluation of fibromyalgia; was dx'd 20 years ago, but thought all in her head.  Meets criteria for fibromyalgia AND myofascial pain syndrome  Takes Ativan as needed- will con't to get from PCP.   2.  Dr Sharion Balloon- The survivor's Handbook to Fibromyalgia and Myofascial Pain Syndrome Theracane- 2-4 minutes on each trigger point- hold firm pressure, don't massage; can get for $20-30 online- will never need replacing- everything on [place].  Hold enough pressure til uncomfortable- not screaming! And start with less pressure and add more over the 2+ minutes.  Tennis balls- 2-5 minutes on each trigger point- buttocks, back of thighs, calves, low and mid back- can throw in dryer x1 to make softer. Magnesium 400 mg 1-3x/day for muscle tightness- titrate up until loose stools Lidocaine patches- over the counter- 2-3 patches 12 hrs on;12 hrs off- areas of most pain- never on bottom of feet- I really like it to wrap around angle of neck on both side when you have headache.  Rolling pin- can use on calves, thighs and arms, and buttocks- roll slowly over muscles firmly- roll towards heart  6.   Duloxetine /Cymbalta 30 mg nightly x 1 week  Then 60 mg nightly- for nerve pain 1% of patients can have nausea with Duloxetine- call me if needs an anti-nausea medicine. Can also cause mild dry mouth/dry eyes and mild constipation.  7.  Can't continue Gabapentin while on Duloxetine.   8.  Wait on tramadol- and not taking Trazodone- so not interference with duloxetine.   9. Will put on wait list for trigger point injections- and also do at next appointment  10. F/U in 3 months- call me in 3-4 weeks to let me know how things going- mychart or call.    11. Needs to get back to walking- work up to 30 minutes/day. No weights until trigger point  injections.

## 2022-04-14 ENCOUNTER — Encounter: Payer: Self-pay | Admitting: Physical Medicine and Rehabilitation

## 2022-04-17 ENCOUNTER — Other Ambulatory Visit: Payer: Self-pay

## 2022-04-17 ENCOUNTER — Emergency Department (HOSPITAL_BASED_OUTPATIENT_CLINIC_OR_DEPARTMENT_OTHER): Payer: Medicare PPO | Admitting: Radiology

## 2022-04-17 ENCOUNTER — Encounter (HOSPITAL_BASED_OUTPATIENT_CLINIC_OR_DEPARTMENT_OTHER): Payer: Self-pay

## 2022-04-17 ENCOUNTER — Emergency Department (HOSPITAL_BASED_OUTPATIENT_CLINIC_OR_DEPARTMENT_OTHER)
Admission: EM | Admit: 2022-04-17 | Discharge: 2022-04-17 | Disposition: A | Discharge status: Home / Self Care | Payer: Medicare PPO | Attending: Emergency Medicine | Admitting: Emergency Medicine

## 2022-04-17 DIAGNOSIS — S99922A Unspecified injury of left foot, initial encounter: Secondary | ICD-10-CM | POA: Diagnosis not present

## 2022-04-17 DIAGNOSIS — W010XXA Fall on same level from slipping, tripping and stumbling without subsequent striking against object, initial encounter: Secondary | ICD-10-CM | POA: Insufficient documentation

## 2022-04-17 DIAGNOSIS — S93402A Sprain of unspecified ligament of left ankle, initial encounter: Secondary | ICD-10-CM | POA: Insufficient documentation

## 2022-04-17 DIAGNOSIS — Z794 Long term (current) use of insulin: Secondary | ICD-10-CM | POA: Diagnosis not present

## 2022-04-17 DIAGNOSIS — Z7984 Long term (current) use of oral hypoglycemic drugs: Secondary | ICD-10-CM | POA: Insufficient documentation

## 2022-04-17 DIAGNOSIS — N189 Chronic kidney disease, unspecified: Secondary | ICD-10-CM | POA: Insufficient documentation

## 2022-04-17 DIAGNOSIS — E1122 Type 2 diabetes mellitus with diabetic chronic kidney disease: Secondary | ICD-10-CM | POA: Diagnosis not present

## 2022-04-17 DIAGNOSIS — J45909 Unspecified asthma, uncomplicated: Secondary | ICD-10-CM | POA: Insufficient documentation

## 2022-04-17 DIAGNOSIS — Z79899 Other long term (current) drug therapy: Secondary | ICD-10-CM | POA: Insufficient documentation

## 2022-04-17 DIAGNOSIS — S99912A Unspecified injury of left ankle, initial encounter: Secondary | ICD-10-CM | POA: Diagnosis present

## 2022-04-17 NOTE — Discharge Instructions (Signed)
You were seen in the emergency department for ankle pain.  I likely think you sprained your ankle. Your x-ray showed no broken bones. We have wrapped your ankle and given you crutches.   You can use ice and ibuprofen or tylenol as needed. I recommend following up with the orthopedist if your pain continues.

## 2022-04-17 NOTE — ED Provider Notes (Signed)
[facility] EMERGENCY DEPT Provider Note   CSN: [id number] Arrival date & time: 04/17/22  6789     History  Chief Complaint  Patient presents with   Foot Pain    Cassandra Reid is a 66 y.o. female who presents to the emergency department for left ankle pain. Pt reports she tripped and fell yesterday evening and twisted her left foot. States her pain is worse with weight bearing. She has tried using ice on the affected area. No other trauma noted. No numbness.    Foot Pain      Home Medications Prior to Admission medications   Medication Sig Start Date End Date Taking? Authorizing Provider  amLODipine (NORVASC) 10 MG tablet TAKE 1 TABLET BY MOUTH EVERY DAY 01/18/22   Ezequiel Essex, MD  Ascorbic Acid (VITAMIN C ADULT GUMMIES PO) Take 1 each by mouth daily.    [provider]  atorvastatin (LIPITOR) 40 MG tablet TAKE 1 TABLET BY MOUTH EVERYDAY AT BEDTIME 02/05/22   Ezequiel Essex, MD  baclofen (LIORESAL) 10 MG tablet TAKE 1 TABLET BY MOUTH TWICE A DAY AS NEEDED FOR MUSCLE SPASM 12/26/21   Ezequiel Essex, MD  BD PEN NEEDLE NANO 2ND GEN 32G X 4 MM MISC USE WITH INSULIN 02/21/21   Ezequiel Essex, MD  blood glucose meter kit and supplies Dispense based on patient and insurance preference. Use up to four times daily as directed. (FOR ICD-10 E10.9, E11.9). 03/09/22   Ezequiel Essex, MD  Continuous Blood Gluc Receiver ([place]) DEVI Please use as directed to check blood sugar. 01/17/22   Ezequiel Essex, MD  Continuous Blood Gluc Sensor (DEXCOM G6 SENSOR) MISC Use as directed to check blood sugar. 01/17/22   Ezequiel Essex, MD  Continuous Blood Gluc Transmit (DEXCOM G6 TRANSMITTER) MISC Use as directed to check blood sugar. 01/17/22   Ezequiel Essex, MD  dapagliflozin propanediol (FARXIGA) 10 MG TABS tablet Take 1 tablet (10 mg total) by mouth daily before breakfast. This is a patient assistance medication. Patient may not be approved and/or have medication.  Please ask and verify when performing med review. 09/27/21   [name], MD  dicyclomine (BENTYL) 20 MG tablet Take 1 tablet (20 mg total) by mouth 2 (two) times daily as needed for spasms. Patient not taking: Reported on 04/13/2022 10/15/21   [name], PA-C  DULoxetine (CYMBALTA) 30 MG capsule Take 1 capsule (30 mg total) by mouth at bedtime. Take 1 capsule nightly x 1 weke, then 2 capsules/60 mg nightly- for nerve pain- will start to help 2nd week. 04/13/22   Lovorn, [name], MD  famotidine (PEPCID) 20 MG tablet TAKE 1 TABLET BY MOUTH TWICE A DAY 12/18/21   Ezequiel Essex, MD  fluticasone [facility]) 50 MCG/ACT nasal spray Place 1 spray into both nostrils daily as needed for allergies or rhinitis. 08/09/21   [name], DO  furosemide (LASIX) 40 MG tablet Take 1 tablet (40 mg total) by mouth daily. 03/08/21   Ezequiel Essex, MD  gabapentin (NEURONTIN) 600 MG tablet TAKE 0.5-1 TABLET (300-600 MG TOTAL) BY MOUTH 3 (THREE) TIMES DAILY. 01/22/22   Ezequiel Essex, MD  glucose blood test strip Use as instructed 03/09/22   Ezequiel Essex, MD  insulin aspart (FIASP) 100 UNIT/ML FlexTouch Pen Inject 15 Units into the skin in the morning, at noon, and at bedtime. This is a patient assistance medication. Patient may not be approved and/or have medication. Please ask and verify when performing med review. 11/01/21  Lind Covert, MD  insulin degludec (TRESIBA) 100 UNIT/ML FlexTouch Pen Inject 45 Units into the skin daily. This is a patient assistance medication. Patient may not be approved and/or have medication. Please ask and verify when performing med review. 02/12/22   [name], MD  KLOR-CON M20 20 MEQ tablet TAKE 1 TABLET BY MOUTH TWICE A DAY 07/26/21   Ezequiel Essex, MD  lisinopril (ZESTRIL) 10 MG tablet Take 1 tablet (10 mg total) by mouth daily. 12/12/21   Ezequiel Essex, MD  loratadine (CLARITIN) 10 MG tablet TAKE 1 TABLET EVERY DAY 01/18/22   Ezequiel Essex, MD  LORazepam  (ATIVAN) 0.5 MG tablet Take 1 tablet (0.5 mg total) by mouth every 8 (eight) hours as needed for anxiety. Start 03/22/2022 03/22/22   Ezequiel Essex, MD  magnesium oxide (MAG-OX) 400 (241.3 Mg) MG tablet Take 1 tablet (400 mg total) by mouth 2 (two) times daily. 03/27/20   Regalado, [name], MD  metFORMIN (GLUCOPHAGE-XR) 750 MG 24 hr tablet TAKE 1 TABLET (750 MG TOTAL) BY MOUTH 2 (TWO) TIMES DAILY WITH A MEAL. 02/07/22   Ezequiel Essex, MD  Multiple Vitamin (MULTIVITAMIN PO) Take 1 tablet by mouth daily.    [provider]  ondansetron (ZOFRAN) 4 MG tablet Take 1 tablet (4 mg total) by mouth every 6 (six) hours. Patient not taking: Reported on 04/13/2022 10/15/21   [name], PA-C  OneTouch Delica Lancets 48G MISC Please use to check blood sugar up to 4 times daily. E11.42 03/09/22   Ezequiel Essex, MD  pantoprazole (PROTONIX) 40 MG tablet Take 1 tablet (40 mg total) by mouth daily. 03/30/22   Ezequiel Essex, MD  Semaglutide, 2 MG/DOSE, (OZEMPIC, 2 MG/DOSE,) 8 MG/3ML SOPN Inject 2 mg into the skin once a week. On Friday 02/12/22   [name], MD  sucralfate (CARAFATE) 1 g tablet TAKE 1 TABLET FOUR TIMES DAILY, WITH MEALS AND AT BEDTIME (OKAY TO TRIAL OFF AS INSTRUCTED) 03/13/22   Ezequiel Essex, MD  traZODone (DESYREL) 50 MG tablet TAKE 1/2 TABLET BY MOUTH AT BEDTIME AS NEEDED FOR SLEEP. Patient not taking: Reported on 04/13/2022 01/17/22   Ezequiel Essex, MD      Allergies    Patient has no known allergies.    Review of Systems   Review of Systems  Musculoskeletal:  Positive for arthralgias.       Left ankle pain  Neurological:  Negative for weakness and numbness.  All other systems reviewed and are negative.  Physical Exam Updated Vital Signs BP 132/66 (BP Location: Right Arm)   Pulse 77   Temp 98.1 F (36.7 C) (Oral)   Resp 16   Ht 5' 1" (1.549 m)   Wt 79.4 kg   SpO2 97%   BMI 33.07 kg/m  Physical Exam Vitals and nursing note reviewed.  Constitutional:       Appearance: Normal appearance.  HENT:     Head: Normocephalic and atraumatic.  Eyes:     Conjunctiva/sclera: Conjunctivae normal.  Cardiovascular:     Pulses:          Dorsalis pedis pulses are 2+ on the right side and 2+ on the left side.  Pulmonary:     Effort: Pulmonary effort is normal. No respiratory distress.  Musculoskeletal:     Comments: Normal passive ROM of left ankle, 4/5 strength to plantar and dorsiflexion due to pain. Normal sensation. No obvious deformities noted.   Skin:    General: Skin is warm and  dry.     Comments: No wounds or overlying skin changes to left ankle or foot  Neurological:     Mental Status: She is alert.  Psychiatric:        Mood and Affect: Mood normal.        Behavior: Behavior normal.    ED Results / Procedures / Treatments   Labs (all labs ordered are listed, but only abnormal results are displayed) Labs Reviewed - No data to display  EKG None  Radiology DG Foot Complete Left  Result Date: 04/17/2022 CLINICAL DATA:  Fall, foot injury EXAM: LEFT FOOT - COMPLETE 3+ VIEW COMPARISON:  Left foot radiograph 06/08/2021 FINDINGS: There is no evidence of acute fracture. There is a tiny os navicularis. Mild interphalangeal joint degenerative change. Plantar dorsal calcaneal spurring and enthesophyte formation at the base of the fifth metatarsal. IMPRESSION: No acute osseous abnormality. Electronically Signed   By: Maurine Simmering M.D.   On: 04/17/2022 10:38    Procedures Procedures    Medications Ordered in ED Medications - No data to display  ED Course/ Medical Decision Making/ A&P                           Medical Decision Making Amount and/or Complexity of Data Reviewed Radiology: ordered.   This patient is a 66 y.o. female  who presents to the ED for concern of left ankle pain after mechanical fall yesterday.   Past Medical History / Co-morbidities: Asthma, arthritis, GERD, migraines, IBS, CKD, diabetes, HLD, osteoporosis  Physical  Exam: Physical exam performed. The pertinent findings include: 4/5 strength to plantar/dorsiflexion of left ankle due to pain. Neurovascularly in tact.  Lab Tests/Imaging studies: I Ordered, and personally interpreted labs/imaging and the pertinent results include:  no acute fractures or dislocations. I agree with the radiologist interpretation.   Disposition: After consideration of the diagnostic results and the patients response to treatment, I feel that patient likely sustained an ankle sprain. Will provide ACE wrap and crutches and recommend orthopedic follow up if symptoms persist. Recommended RICE therapy and over the counter medications. Discussed reasons to return to the emergency department, and the patient is agreeable to the plan.   Final Clinical Impression(s) / ED Diagnoses Final diagnoses:  Sprain of left ankle, unspecified ligament, initial encounter    Rx / DC Orders ED Discharge Orders     None      Portions of this report may have been transcribed using voice recognition software. Every effort was made to ensure accuracy; however, inadvertent computerized transcription errors may be present.    Estill Cotta 04/17/22 1219    Isla Pence, MD 04/17/22 1506

## 2022-04-17 NOTE — ED Triage Notes (Signed)
States fell yesterday tripping and twisted left foot.  Pulses and cap refill good.  Unable to walk on

## 2022-04-23 ENCOUNTER — Ambulatory Visit: Payer: Medicare PPO | Admitting: Pulmonary Disease

## 2022-04-24 ENCOUNTER — Encounter: Payer: Self-pay | Admitting: *Deleted

## 2022-04-25 NOTE — Progress Notes (Deleted)
Cardiology Clinic Note   Patient Name: Cassandra Reid Date of Encounter: 04/25/2022  Primary Care Provider:  Ezequiel Essex, MD Primary Cardiologist:  None  Patient Profile    66 year old female with known history of mitral valve prolapse, palpitations, hypercholesterolemia insulin-dependent diabetes, hypertension, GERD, chronic kidney disease stage III as complication from type 2 diabetes, IBS, migraines, cerebral aneurysm without rupture and chest discomfort.  Echocardiogram May 2021 revealed normal LV function, mild ventricular hypertrophy, with grade 1 diastolic dysfunction and mild atrial enlargement with trace mitral regurgitation.  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  She also had a cardiac monitor in May 2021 which showed sinus rhythm with PACs, PVCs, brief atrial tachycardia.  She has chronic exertional chest pain in the left lateral axillary area which has been ongoing and unchanged for several years.  Last seen by Dr. Stanford Breed on 03/14/2021.  Past Medical History    Past Medical History:  Diagnosis Date   Allergic rhinitis 09/26/2018   Anosmia    Arthritis    Asthma    Balance problems 09/22/2021   C. difficile colitis 07/18/2018   Cerebral aneurysm without rupture    Chest pain 03/26/2020   Chronic pain syndrome 04/23/2019   CKD stage 3 due to type 2 diabetes mellitus 04/15/2019   DDD (degenerative disc disease), cervical 04/16/2019   Encephalomalacia on imaging study 08/25/2020   Brain MRI 08/25/21: Few scattered areas of encephalomalacia and gliosis within the overlying left frontal lobe most likely postoperative in nature.   Fatty liver 05/31/2020   Fibromyalgia    Former smoker    Generalized anxiety disorder    GERD (gastroesophageal reflux disease)    Heart murmur    History of chicken pox    History of colon polyps    Hyperlipidemia associated  with type 2 diabetes mellitus 07/18/2018   Hypertension associated with diabetes 07/18/2018   IBS (irritable bowel syndrome)    Incontinence 09/22/2021   Bowel and bladder.    Ischemic brain injury 05/29/2021   Lacunar infarction 05/25/2021   Brain MRI 08/25/21: Few small remote lacunar infarcts noted at the right caudate and left internal capsule   Legally blind    Major depressive disorder    Migraines    Myalgia 04/23/2019   OSA (obstructive sleep apnea) 07/18/2018   not using CPAP machine (broken)   Osteoporosis 01/18/2021   Paroxysmal atrial tachycardia    On telemetry   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   Type 2 diabetes mellitus with diabetic polyneuropathy, with long-term current use of insulin 07/18/2018   Past Surgical History:  Procedure Laterality Date   ABDOMINAL HYSTERECTOMY  2012   APPENDECTOMY  1972   CEREBRAL ANEURYSM REPAIR Left 2004   Aneurysm clip in place near the level of the left ICA on Brain MRI 08/2020. Also coiling performed   CHOLECYSTECTOMY  2015   CRANIOTOMY Left    left pterional craniotomy   MENISCUS REPAIR Bilateral     Allergies  No Known Allergies  History of Present Illness    [name] presents today for ongoing assessment and management of hypertension, palpitations, and hyperlipidemia with chronic chest pain with coronary CTA calcium score of 0.  Of note, she was seen in the ED on 04/17/2022 after tripping, and falling,  and spraining her left ankle.  Home Medications    Current Outpatient Medications  Medication Sig Dispense Refill   amLODipine (NORVASC) 10 MG tablet TAKE 1 TABLET BY MOUTH EVERY DAY 90 tablet 3   Ascorbic Acid (VITAMIN C ADULT GUMMIES PO) Take 1 each by mouth daily.     atorvastatin (LIPITOR) 40 MG tablet TAKE 1 TABLET BY MOUTH EVERYDAY AT BEDTIME 90 tablet 1   baclofen (LIORESAL) 10 MG tablet TAKE 1 TABLET BY MOUTH TWICE A DAY  AS NEEDED FOR MUSCLE SPASM 180 tablet 1   BD PEN NEEDLE NANO 2ND GEN 32G X 4 MM MISC USE WITH INSULIN 100 each 4   blood glucose meter kit and supplies Dispense based on patient and insurance preference. Use up to four times daily as directed. (FOR ICD-10 E10.9, E11.9). 1 each 0   Continuous Blood Gluc Receiver ([place]) DEVI Please use as directed to check blood sugar. 1 each 12   Continuous Blood Gluc Sensor (DEXCOM G6 SENSOR) MISC Use as directed to check blood sugar. 4 each 11   Continuous Blood Gluc Transmit (DEXCOM G6 TRANSMITTER) MISC Use as directed to check blood sugar. 1 each 12   dapagliflozin propanediol (FARXIGA) 10 MG TABS tablet Take 1 tablet (10 mg total) by mouth daily before breakfast. This is a patient assistance medication. Patient may not be approved and/or have medication. Please ask and verify when performing med review. 30 tablet 0   dicyclomine (BENTYL) 20 MG tablet Take 1 tablet (20 mg total) by mouth 2 (two) times daily as needed for spasms. (Patient not taking: Reported on 04/13/2022) 20 tablet 0   DULoxetine (CYMBALTA) 30 MG capsule Take 1 capsule (30 mg total) by mouth at bedtime. Take 1 capsule nightly x 1 weke, then 2 capsules/60 mg nightly- for nerve pain- will start to help 2nd week. 60 capsule 5   famotidine (PEPCID) 20 MG tablet TAKE 1 TABLET BY MOUTH TWICE A DAY 180 tablet 1   fluticasone (FLONASE) 50 MCG/ACT nasal spray Place 1 spray into both nostrils daily as needed for allergies or rhinitis. 9.9 mL 1   furosemide (LASIX) 40 MG tablet Take 1 tablet (40 mg total) by mouth daily. 90 tablet 3   gabapentin (NEURONTIN) 600 MG tablet TAKE 0.5-1 TABLET (300-600 MG TOTAL) BY MOUTH 3 (THREE) TIMES DAILY. 90 tablet 3   glucose blood test strip Use as instructed 100 each 12   insulin aspart (FIASP) 100 UNIT/ML FlexTouch Pen Inject 15 Units into the skin in the morning, at noon, and at bedtime. This is a patient assistance medication. Patient may not be approved  and/or have medication. Please ask and verify when performing med review. 15 mL 0   insulin degludec (TRESIBA) 100 UNIT/ML FlexTouch Pen Inject 45 Units into the skin daily. This is a patient assistance medication. Patient may not be approved and/or have medication. Please ask and verify when performing med review.     KLOR-CON M20 20 MEQ tablet TAKE 1 TABLET BY MOUTH TWICE A DAY 180 tablet 1   lisinopril (ZESTRIL) 10 MG tablet Take 1 tablet (10 mg total) by mouth daily. 90 tablet 3   loratadine (CLARITIN) 10 MG tablet TAKE 1 TABLET EVERY DAY 90 tablet 3   LORazepam (ATIVAN) 0.5 MG tablet Take 1 tablet (0.5 mg total) by mouth every 8 (eight) hours as needed for anxiety. Start 03/22/2022 90 tablet 0   magnesium oxide (MAG-OX) 400 (241.3 Mg) MG tablet Take 1 tablet (  400 mg total) by mouth 2 (two) times daily. 30 tablet 0   metFORMIN (GLUCOPHAGE-XR) 750 MG 24 hr tablet TAKE 1 TABLET (750 MG TOTAL) BY MOUTH 2 (TWO) TIMES DAILY WITH A MEAL. 180 tablet 3   Multiple Vitamin (MULTIVITAMIN PO) Take 1 tablet by mouth daily.     ondansetron (ZOFRAN) 4 MG tablet Take 1 tablet (4 mg total) by mouth every 6 (six) hours. (Patient not taking: Reported on 04/13/2022) 12 tablet 0   OneTouch Delica Lancets 55D MISC Please use to check blood sugar up to 4 times daily. E11.42 100 each 12   pantoprazole (PROTONIX) 40 MG tablet Take 1 tablet (40 mg total) by mouth daily. 90 tablet 0   Semaglutide, 2 MG/DOSE, (OZEMPIC, 2 MG/DOSE,) 8 MG/3ML SOPN Inject 2 mg into the skin once a week. On Friday     sucralfate (CARAFATE) 1 g tablet TAKE 1 TABLET FOUR TIMES DAILY, WITH MEALS AND AT BEDTIME (OKAY TO TRIAL OFF AS INSTRUCTED) 240 tablet 1   traZODone (DESYREL) 50 MG tablet TAKE 1/2 TABLET BY MOUTH AT BEDTIME AS NEEDED FOR SLEEP. (Patient not taking: Reported on 04/13/2022) 45 tablet 1   No current facility-administered medications for this visit.     Family History    Family History  Problem Relation Age of Onset   Alcohol  abuse Mother    Diabetes Mother    Hypertension Mother    Kidney disease Mother    COPD Father    Diabetes Father    Early death Father    Heart disease Father    Hyperlipidemia Father    Hypertension Father    Diabetes Sister    Hypertension Sister    Breast cancer Sister    Diabetes Sister    Hypertension Sister    Kidney disease Sister    Cancer Maternal Grandmother    Alcohol abuse Maternal Grandfather    Cancer Maternal Grandfather    Cancer Paternal Grandmother    Cancer Paternal Grandfather    She indicated that her mother is deceased. She indicated that her father is deceased. She indicated that only one of her two sisters is alive. She indicated that her maternal grandmother is deceased. She indicated that her maternal grandfather is deceased. She indicated that her paternal grandmother is deceased. She indicated that her paternal grandfather is deceased.  Social History    Social History   Socioeconomic History   Marital status: Legally Separated    Spouse name: John   Number of children: 3   Years of education: 14   Highest education level: Associate degree: academic program  Occupational History   Occupation: disabled  Tobacco Use   Smoking status: Former    Packs/day: 1.00    Years: 3.00    Pack years: 3.00    Types: Cigarettes    Quit date: 06/1967    Years since quitting: 54.8    Passive exposure: Past   Smokeless tobacco: Never  Vaping Use   Vaping Use: Never used  Substance and Sexual Activity   Alcohol use: Never   Drug use: Not Currently   Sexual activity: Not Currently  Other Topics Concern   Not on file  Social History Narrative   Patient lives in [place].    Patient walks her dog daily for exercise.    Patient is separated from her husband.   Patient does not drive. Uses home delivery services and ubers.    Social Determinants of Radio broadcast assistant  Strain: Low Risk    Difficulty of Paying Living Expenses: Not hard at all   Food Insecurity: No Food Insecurity   Worried About Charity fundraiser in the Last Year: Never true   Ran Out of Food in the Last Year: Never true  Transportation Needs: No Transportation Needs   Lack of Transportation (Medical): No   Lack of Transportation (Non-Medical): No  Physical Activity: Sufficiently Active   Days of Exercise per Week: 7 days   Minutes of Exercise per Session: 30 min  Stress: Stress Concern Present   Feeling of Stress : Very much  Social Connections: Socially Isolated   Frequency of Communication with Friends and Family: Once a week   Frequency of Social Gatherings with Friends and Family: Once a week   Attends Religious Services: Never   Marine scientist or Organizations: No   Attends Music therapist: Never   Marital Status: Separated  Intimate Partner Violence: Not At Risk   Fear of Current or Ex-Partner: No   Emotionally Abused: No   Physically Abused: No   Sexually Abused: No     Review of Systems    General:  No chills, fever, night sweats or weight changes.  Cardiovascular:  No chest pain, dyspnea on exertion, edema, orthopnea, palpitations, paroxysmal nocturnal dyspnea. Dermatological: No rash, lesions/masses Respiratory: No cough, dyspnea Urologic: No hematuria, dysuria Abdominal:   No nausea, vomiting, diarrhea, bright red blood per rectum, melena, or hematemesis Neurologic:  No visual changes, wkns, changes in mental status. All other systems reviewed and are otherwise negative except as noted above.     Physical Exam    VS:  There were no vitals taken for this visit. , BMI There is no height or weight on file to calculate BMI.     GEN: Well nourished, well developed, in no acute distress. HEENT: normal. Neck: Supple, no JVD, carotid bruits, or masses. Cardiac: RRR, no murmurs, rubs, or gallops. No clubbing, cyanosis, edema.  Radials/DP/PT 2+ and equal bilaterally.  Respiratory:  Respirations regular and unlabored,  clear to auscultation bilaterally. GI: Soft, nontender, nondistended, BS + x 4. MS: no deformity or atrophy. Skin: warm and dry, no rash. Neuro:  Strength and sensation are intact. Psych: Normal affect.  Accessory Clinical Findings    ECG personally reviewed by me today- *** - No acute changes  Lab Results  Component Value Date   WBC 8.8 10/15/2021   HGB 14.9 10/15/2021   HCT 44.2 10/15/2021   MCV 82.2 10/15/2021   PLT 181 10/15/2021   Lab Results  Component Value Date   CREATININE 0.99 10/15/2021   BUN 20 10/15/2021   NA 139 10/15/2021   K 3.5 10/15/2021   CL 104 10/15/2021   CO2 22 10/15/2021   Lab Results  Component Value Date   ALT 50 (H) 10/15/2021   AST 31 10/15/2021   ALKPHOS 98 10/15/2021   BILITOT 0.5 10/15/2021   Lab Results  Component Value Date   CHOL 120 07/31/2019   HDL 42 (L) 07/31/2019   LDLCALC 48 07/31/2019   TRIG 242 (H) 07/31/2019   CHOLHDL 2.9 07/31/2019    Lab Results  Component Value Date   HGBA1C 7.7 (A) 12/27/2021    Review of Prior Studies: Coronary CTA 03/26/2020 FINDINGS: Quality: Good (HR 72)   Coronary calcium score: The patient's coronary artery calcium score is 0, which places the patient in the 0 percentile.   Coronary arteries: Normal coronary origins.  Right dominance.   Right Coronary Artery: Dominant vessel.  No significant stenosis.   Left Main Coronary Artery: Normal. Bifurcates into the LAD and LCx arteries.   Left Anterior Descending Coronary Artery: Tortuous vessel that does not reach the apex. No stenosis. 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   [place] Intermedius: Moderate-sized branch without stenosis.   Left Circumflex Artery: Large caliber AV groove vessel without stenosis.   Aorta: Normal size, 28 mm at the mid ascending aorta (level of the PA bifurcation) measured double oblique. No calcifications. No dissection.   Aortic Valve: Trileaflet.  No  calcifications.  Echocardiogram 03/26/2020  1. Left ventricular ejection fraction, by estimation, is 65 to 70%. The  left ventricle has normal function. The left ventricle has no regional  wall motion abnormalities. There is mild left ventricular hypertrophy.  Left ventricular diastolic parameters  are consistent with Grade I diastolic dysfunction (impaired relaxation).   2. Right ventricular systolic function is low normal. The right  ventricular size is normal.   3. Left atrial size was mildly dilated.   4. The mitral valve is grossly normal. Trivial mitral valve  regurgitation.   5. The aortic valve is tricuspid. Aortic valve regurgitation is not  visualized.   Assessment & Plan   1.  ***    Current medicines are reviewed at length with the patient today.  I have spent *** min's  dedicated to the care of this patient on the date of this encounter to include pre-visit review of records, assessment, management and diagnostic testing,with shared decision making. Signed, Phill Myron. West Pugh, ANP, AACC   04/25/2022 3:42 PM    [facility] [address] [address] Office [phone] Fax [phone]  Notice: This dictation was prepared with Dragon dictation along with smaller phrase technology. Any transcriptional errors that result from this process are unintentional and may not be corrected upon review.

## 2022-04-26 ENCOUNTER — Encounter: Payer: Self-pay | Admitting: Pulmonary Disease

## 2022-04-26 DIAGNOSIS — G4733 Obstructive sleep apnea (adult) (pediatric): Secondary | ICD-10-CM

## 2022-04-26 NOTE — Telephone Encounter (Signed)
Okay to send in order to change settings to 5-8

## 2022-04-26 NOTE — Telephone Encounter (Signed)
Please advise if ok to change settings for this patient from 5-8 cm h20 per patient request. Last OV with you was 11/2021.

## 2022-04-27 ENCOUNTER — Ambulatory Visit: Payer: Medicare PPO | Admitting: Adult Health

## 2022-04-29 ENCOUNTER — Encounter: Payer: Self-pay | Admitting: Physical Medicine and Rehabilitation

## 2022-04-30 MED ORDER — DULOXETINE HCL 60 MG PO CPEP: 60.0000 mg | ORAL_CAPSULE | Freq: Every day | ORAL | 1 refills | Status: DC

## 2022-05-09 ENCOUNTER — Ambulatory Visit: Payer: Medicare PPO | Admitting: Physical Medicine and Rehabilitation

## 2022-05-18 ENCOUNTER — Ambulatory Visit: Payer: Medicare PPO | Admitting: Cardiology

## 2022-05-18 DIAGNOSIS — H269 Unspecified cataract: Secondary | ICD-10-CM | POA: Diagnosis not present

## 2022-05-18 DIAGNOSIS — H25811 Combined forms of age-related cataract, right eye: Secondary | ICD-10-CM | POA: Diagnosis not present

## 2022-05-21 ENCOUNTER — Other Ambulatory Visit: Payer: Medicare PPO

## 2022-05-21 DIAGNOSIS — E1142 Type 2 diabetes mellitus with diabetic polyneuropathy: Secondary | ICD-10-CM

## 2022-05-21 DIAGNOSIS — Z794 Long term (current) use of insulin: Secondary | ICD-10-CM | POA: Diagnosis not present

## 2022-05-21 DIAGNOSIS — K760 Fatty (change of) liver, not elsewhere classified: Secondary | ICD-10-CM | POA: Diagnosis not present

## 2022-05-21 DIAGNOSIS — E1169 Type 2 diabetes mellitus with other specified complication: Secondary | ICD-10-CM | POA: Diagnosis not present

## 2022-05-21 DIAGNOSIS — E785 Hyperlipidemia, unspecified: Secondary | ICD-10-CM | POA: Diagnosis not present

## 2022-05-22 ENCOUNTER — Encounter: Payer: Self-pay | Admitting: Family Medicine

## 2022-05-22 LAB — COMPREHENSIVE METABOLIC PANEL
ALT: 57 IU/L — ABNORMAL HIGH (ref 0–32)
AST: 30 IU/L (ref 0–40)
Albumin/Globulin Ratio: 2 (ref 1.2–2.2)
Albumin: 4.7 g/dL (ref 3.8–4.8)
Alkaline Phosphatase: 115 IU/L (ref 44–121)
BUN/Creatinine Ratio: 15 (ref 12–28)
BUN: 18 mg/dL (ref 8–27)
Bilirubin Total: 0.4 mg/dL (ref 0.0–1.2)
CO2: 19 mmol/L — ABNORMAL LOW (ref 20–29)
Calcium: 10.3 mg/dL (ref 8.7–10.3)
Chloride: 103 mmol/L (ref 96–106)
Creatinine, Ser: 1.19 mg/dL — ABNORMAL HIGH (ref 0.57–1.00)
Globulin, Total: 2.4 g/dL (ref 1.5–4.5)
Glucose: 176 mg/dL — ABNORMAL HIGH (ref 70–99)
Potassium: 4.7 mmol/L (ref 3.5–5.2)
Sodium: 140 mmol/L (ref 134–144)
Total Protein: 7.1 g/dL (ref 6.0–8.5)
eGFR: 51 mL/min/{1.73_m2} — ABNORMAL LOW (ref >59)

## 2022-05-22 LAB — HEMOGLOBIN A1C
Est. average glucose Bld gHb Est-mCnc: 171 mg/dL
Hgb A1c MFr Bld: 7.6 % — ABNORMAL HIGH (ref 4.8–5.6)

## 2022-05-23 ENCOUNTER — Telehealth: Payer: Self-pay | Admitting: Family Medicine

## 2022-05-23 NOTE — Telephone Encounter (Signed)
Called patient regarding test results.  Discussed chronically mildly elevated ALT, previous negative hepatitis C 3 years ago, and normal CT abdomen November 2022.  Disclosed that because her alk phos and AST are chronically normal and the only abnormality was the ALT, we can simply keep an eye on this with annual CMP.  If LFTs continue to rise or she develops clinical signs or symptoms of hepatic dysfunction, we can always pursue imaging and further work-up at that time.  Discussed creatinine elevation that does not meet AKI criteria.  She reports she is not hydrating as well as normal.  Discussed upcoming annual nephrology appointment, I will simply let them recheck at the appointment.  She is disappointed that her A1c is not as low as she had hoped, but I did reassure her that her A1c is at goal of less than 8.  She is experiencing some nausea and decreased appetite, likely from the [place].  We discussed cessation of Ozempic at this time due to side effects, however she reports that she is pleased with her A1c and wants to continue with this medication.  She will let us know in the future if the symptoms and side effects began to outweigh the benefit.  I reassured her that her A1c is much better and we can consider different oral medications to help control A1c instead of injections.  She is pleased with her start at physical medicine and rehabilitation.  She is unsure if the interventions are providing much relief, but she is quite hopeful.  She is looking forward to trigger point injections in the future.  Mood is doing okay after the passing of her pet dog recently.  Ezequiel Essex, MD

## 2022-05-25 NOTE — Progress Notes (Unsigned)
Virtual Visit via Video Note  Consent was obtained for video visit:  Yes.   Answered questions that patient had about telehealth interaction:  Yes.   I discussed the limitations, risks, security and privacy concerns of performing an evaluation and management service by telemedicine. I also discussed with the patient that there may be a patient responsible charge related to this service. The patient expressed understanding and agreed to proceed.  Pt location: Home Physician Location: office Name of referring provider:  Ezequiel Essex, MD I connected with Cassandra Reid at patients initiation/request on 05/28/2022 at 10:30 AM EDT by video enabled telemedicine application and verified that I am speaking with the correct person using two identifiers. Pt MRN:  [id number] Pt DOB:  [birth year] Video Participants:  Cassandra Reid   Assessment/Plan:   Paroxysmal tingling needle pain in left temple.  May be a neuralgia involving the scar from her surgery - there isn't anything different I can offer as far as treatment.  She may want to contact her provider prescribing the gabapentin and Cymbalta to see if one of them may be increased.  Otherwise, I would start nortriptyline but would hate to add another medication. Chronic tension-type headache, not intractable - overall stable/manageable now. History of cerebral aneurysm s/p clipping.  Stable - no recurrence Memory deficits - results of neuropsychological evaluation unreliable due to suspected poor effort.  Cerebrovascular changes, chronic pain, poor sleep and psychiatric distress likely factor but cannot rule out underlying neurodegenerative disease. Major Depressive disorder Obstructive sleep apnea     If temporal pain not improved in 4 weeks, she will discuss with her pain specialist about increasing/changes in gabapentin or Cymbalta  Advised to contact psychology to inquire about a provider who provides cognitive behavioral therapy - once  depression is adequately controlled, consider repeat neuropsychological testing if memory still a problem Continue use of CPAP Follow up if temporal neuralgia continues to be a problem  Subjective:  Cassandra Reid is a 66 year old female with chronic kidney disease, diabetes, hypertension, fibromyalgia, and history of cerebral aneurysm status post coiling who follows up for cerebral aneurysm, headaches and memory deficits.   UPDATE: Current medication:  Cymbalta $RemoveB'60mg'fsKgIFzH$  daily (just started 2-3 weeks ago), gabapentin $RemoveBeforeDE'600mg'CMNsmziuwbdcXcH$  BID  For headaches, started on nortriptyline last August.  She currently is not on it.  She cannot remember if she ever started it.  She is currently taking gabapentin and cymbalta which is helping with the pain in neck and shoulder.  The head spasms are manageable.  Now a pins and needles sensation in left temple.  Lasts a few minutes and 10 times a day.  Seeing physical medicine.  Plan is to get trigger point injections later in the summer.     She reports feeling more "teary" and forgetful.  She underwent a competency evaluation in July 2022.  While she did demonstrate memory deficits, it was felt that she can manage her finances.  She underwent neuropsychological evaluation in January 2023 which was terminated early due to patient's transportation concerns but revealed low scores across the board that seemed out of proportion to her cognitive function, suggesting performance likely hindered by poor effort related to significant depression and anxiety.  She has since been diagnosed with OSA and just started a CPAP a couple of weeks ago.   She did speak to a therapist for a few sessions which was ineffective.     HISTORY: She had clipping and then coiling for non-ruptured aneurysm x2  about 14 years ago.  She had a recurrent image several years later.  She reports that they saw another aneurysm but nothing that yet required intervention.     Since the surgeries, she feels a pressure on  top of head as well as paroxysmal stabbing pain all over the head.  The head pressure lasts 1 to 2 days and occurs once a week.  The paroxsymal pain lasts a minute off and on during the day and occurs daily.  She denies associated nausea, vomiting, photophobia, phonophobia, visual disturbance or unilateral numbness or weakness.  she has been having neck pain radiating   She has been evaluated by neurosurgery at [facility].  MRI of cervical spine on 07/26/2020 showed moderate degenerative disc disease at C5-6 with left paracentral disc herniation possibly affecting the left C6 nerve root, as well as moderate to severe degenerative disc disease at C6-7 with bilateral uncovertebral spurs and moderate bilateral neuroforaminal stenosis.   MRA of head from 12/15/2018 personally reviewed and showed artifact from aneurysm clipping obscuring the left circle-of-Willis, as well as a probable 2 x 4 mm supraclinoid right ICA infundibulum (rather than aneurysm).  MRI and MRA of head on 08/25/2020 showed few small remote lacunar infarcts in the right caudated and left internal capsule as well as postoperative changes from previous left pterional craniotomy with aneurysm clip at level of left ICA terminus but no acute intracranial abnormality or new/recurrence aneurysm.  Previous   She also has fibromyalgia.  She also has significant anxiety.    Past medications:  tizanidine (helpful but not covered by insurance), baclofen, naproxen  Past Medical History: Past Medical History:  Diagnosis Date   Allergic rhinitis 09/26/2018   Anosmia    Arthritis    Asthma    Balance problems 09/22/2021   C. difficile colitis 07/18/2018   Cerebral aneurysm without rupture    Chest pain 03/26/2020   Chronic pain syndrome 04/23/2019   CKD stage 3 due to type 2 diabetes mellitus 04/15/2019   DDD (degenerative disc disease), cervical 04/16/2019   Encephalomalacia on imaging study 08/25/2020   Brain MRI 08/25/21: Few scattered areas of  encephalomalacia and gliosis within the overlying left frontal lobe most likely postoperative in nature.   Fatty liver 05/31/2020   Fibromyalgia    Former smoker    Generalized anxiety disorder    GERD (gastroesophageal reflux disease)    Heart murmur    History of chicken pox    History of colon polyps    Hyperlipidemia associated with type 2 diabetes mellitus 07/18/2018   Hypertension associated with diabetes 07/18/2018   IBS (irritable bowel syndrome)    Incontinence 09/22/2021   Bowel and bladder.    Ischemic brain injury 05/29/2021   Lacunar infarction 05/25/2021   Brain MRI 08/25/21: Few small remote lacunar infarcts noted at the right caudate and left internal capsule   Legally blind    Major depressive disorder    Migraines    Myalgia 04/23/2019   OSA (obstructive sleep apnea) 07/18/2018   not using CPAP machine (broken)   Osteoporosis 01/18/2021   Paroxysmal atrial tachycardia    On telemetry   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   Type 2 diabetes mellitus with diabetic polyneuropathy, with long-term current use of insulin 07/18/2018    Medications: Outpatient Encounter Medications as of 05/28/2022  Medication Sig Note   amLODipine (NORVASC) 10 MG tablet TAKE 1  TABLET BY MOUTH EVERY DAY    Ascorbic Acid (VITAMIN C ADULT GUMMIES PO) Take 1 each by mouth daily.    atorvastatin (LIPITOR) 40 MG tablet TAKE 1 TABLET BY MOUTH EVERYDAY AT BEDTIME    baclofen (LIORESAL) 10 MG tablet TAKE 1 TABLET BY MOUTH TWICE A DAY AS NEEDED FOR MUSCLE SPASM    BD PEN NEEDLE NANO 2ND GEN 32G X 4 MM MISC USE WITH INSULIN    blood glucose meter kit and supplies Dispense based on patient and insurance preference. Use up to four times daily as directed. (FOR ICD-10 E10.9, E11.9).    Continuous Blood Gluc Receiver (DEXCOM G6 RECEIVER) DEVI Please use as directed to check blood sugar.    Continuous Blood Gluc  Sensor (DEXCOM G6 SENSOR) MISC Use as directed to check blood sugar.    Continuous Blood Gluc Transmit (DEXCOM G6 TRANSMITTER) MISC Use as directed to check blood sugar.    dapagliflozin propanediol (FARXIGA) 10 MG TABS tablet Take 1 tablet (10 mg total) by mouth daily before breakfast. This is a patient assistance medication. Patient may not be approved and/or have medication. Please ask and verify when performing med review.    dicyclomine (BENTYL) 20 MG tablet Take 1 tablet (20 mg total) by mouth 2 (two) times daily as needed for spasms. (Patient not taking: Reported on 04/13/2022)    DULoxetine (CYMBALTA) 60 MG capsule Take 1 capsule (60 mg total) by mouth daily.    famotidine (PEPCID) 20 MG tablet TAKE 1 TABLET BY MOUTH TWICE A DAY    fluticasone (FLONASE) 50 MCG/ACT nasal spray Place 1 spray into both nostrils daily as needed for allergies or rhinitis.    furosemide (LASIX) 40 MG tablet Take 1 tablet (40 mg total) by mouth daily. 03/12/2022: Taking 2-3 days per week for edema    gabapentin (NEURONTIN) 600 MG tablet TAKE 0.5-1 TABLET (300-600 MG TOTAL) BY MOUTH 3 (THREE) TIMES DAILY.    glucose blood test strip Use as instructed    insulin aspart (FIASP) 100 UNIT/ML FlexTouch Pen Inject 15 Units into the skin in the morning, at noon, and at bedtime. This is a patient assistance medication. Patient may not be approved and/or have medication. Please ask and verify when performing med review.    insulin degludec (TRESIBA) 100 UNIT/ML FlexTouch Pen Inject 45 Units into the skin daily. This is a patient assistance medication. Patient may not be approved and/or have medication. Please ask and verify when performing med review.    KLOR-CON M20 20 MEQ tablet TAKE 1 TABLET BY MOUTH TWICE A DAY 03/12/2022: Taking once daily   lisinopril (ZESTRIL) 10 MG tablet Take 1 tablet (10 mg total) by mouth daily.    loratadine (CLARITIN) 10 MG tablet TAKE 1 TABLET EVERY DAY    LORazepam (ATIVAN) 0.5 MG tablet Take 1  tablet (0.5 mg total) by mouth every 8 (eight) hours as needed for anxiety. Start 03/22/2022    magnesium oxide (MAG-OX) 400 (241.3 Mg) MG tablet Take 1 tablet (400 mg total) by mouth 2 (two) times daily.    metFORMIN (GLUCOPHAGE-XR) 750 MG 24 hr tablet TAKE 1 TABLET (750 MG TOTAL) BY MOUTH 2 (TWO) TIMES DAILY WITH A MEAL.    Multiple Vitamin (MULTIVITAMIN PO) Take 1 tablet by mouth daily.    ondansetron (ZOFRAN) 4 MG tablet Take 1 tablet (4 mg total) by mouth every 6 (six) hours. (Patient not taking: Reported on 5/63/8756)    OneTouch Delica Lancets 43P MISC Please use  to check blood sugar up to 4 times daily. E11.42    pantoprazole (PROTONIX) 40 MG tablet Take 1 tablet (40 mg total) by mouth daily.    Semaglutide, 2 MG/DOSE, (OZEMPIC, 2 MG/DOSE,) 8 MG/3ML SOPN Inject 2 mg into the skin once a week. On Friday    sucralfate (CARAFATE) 1 g tablet TAKE 1 TABLET FOUR TIMES DAILY, WITH MEALS AND AT BEDTIME (OKAY TO TRIAL OFF AS INSTRUCTED)    traZODone (DESYREL) 50 MG tablet TAKE 1/2 TABLET BY MOUTH AT BEDTIME AS NEEDED FOR SLEEP. (Patient not taking: Reported on 04/13/2022)    No facility-administered encounter medications on file as of 05/28/2022.    Allergies: No Known Allergies  Family History: Family History  Problem Relation Age of Onset   Alcohol abuse Mother    Diabetes Mother    Hypertension Mother    Kidney disease Mother    COPD Father    Diabetes Father    Early death Father    Heart disease Father    Hyperlipidemia Father    Hypertension Father    Diabetes Sister    Hypertension Sister    Breast cancer Sister    Diabetes Sister    Hypertension Sister    Kidney disease Sister    Cancer Maternal Grandmother    Alcohol abuse Maternal Grandfather    Cancer Maternal Grandfather    Cancer Paternal Grandmother    Cancer Paternal Grandfather     Observations/Objective:   No acute distress.  Alert and oriented.  Speech fluent and not dysarthric.  Language intact.  Eyes  orthophoric on primary gaze.  Face symmetric.   Follow Up Instructions:    -I discussed the assessment and treatment plan with the patient. The patient was provided an opportunity to ask questions and all were answered. The patient agreed with the plan and demonstrated an understanding of the instructions.   The patient was advised to call back or seek an in-person evaluation if the symptoms worsen or if the condition fails to improve as anticipated.   Dudley Major, DO

## 2022-05-28 ENCOUNTER — Encounter: Payer: Self-pay | Admitting: Neurology

## 2022-05-28 ENCOUNTER — Telehealth (INDEPENDENT_AMBULATORY_CARE_PROVIDER_SITE_OTHER): Payer: Medicare PPO | Admitting: Neurology

## 2022-05-28 VITALS — Ht 61.0 in | Wt 170.0 lb

## 2022-05-28 DIAGNOSIS — F331 Major depressive disorder, recurrent, moderate: Secondary | ICD-10-CM

## 2022-05-28 DIAGNOSIS — Z9889 Other specified postprocedural states: Secondary | ICD-10-CM | POA: Diagnosis not present

## 2022-05-28 DIAGNOSIS — Z8679 Personal history of other diseases of the circulatory system: Secondary | ICD-10-CM | POA: Diagnosis not present

## 2022-05-28 DIAGNOSIS — R413 Other amnesia: Secondary | ICD-10-CM | POA: Diagnosis not present

## 2022-05-28 DIAGNOSIS — M792 Neuralgia and neuritis, unspecified: Secondary | ICD-10-CM

## 2022-05-29 ENCOUNTER — Telehealth: Payer: Self-pay

## 2022-05-29 NOTE — Telephone Encounter (Signed)
Left vm informing pt that novo nordisk order is ready for pickup.  4 boxes of ozempic are labeled and ready in med room fridge

## 2022-05-30 DIAGNOSIS — E1122 Type 2 diabetes mellitus with diabetic chronic kidney disease: Secondary | ICD-10-CM | POA: Diagnosis not present

## 2022-05-30 DIAGNOSIS — Z791 Long term (current) use of non-steroidal anti-inflammatories (NSAID): Secondary | ICD-10-CM | POA: Diagnosis not present

## 2022-05-30 DIAGNOSIS — D631 Anemia in chronic kidney disease: Secondary | ICD-10-CM | POA: Diagnosis not present

## 2022-05-30 DIAGNOSIS — N183 Chronic kidney disease, stage 3 unspecified: Secondary | ICD-10-CM | POA: Diagnosis not present

## 2022-05-30 DIAGNOSIS — I129 Hypertensive chronic kidney disease with stage 1 through stage 4 chronic kidney disease, or unspecified chronic kidney disease: Secondary | ICD-10-CM | POA: Diagnosis not present

## 2022-05-30 DIAGNOSIS — N2581 Secondary hyperparathyroidism of renal origin: Secondary | ICD-10-CM | POA: Diagnosis not present

## 2022-05-31 DIAGNOSIS — N183 Chronic kidney disease, stage 3 unspecified: Secondary | ICD-10-CM | POA: Diagnosis not present

## 2022-05-31 NOTE — Telephone Encounter (Signed)
Patient presents to clinic to pick up Ozempic samples. Provided with shipment per [employer].   Patient is also asking about [place] and [employer] shipments. Advised that I was unable to find these in the med refrigerator. Informed patient that I would reach out to [employer] for status of shipment.   Veronda Prude, RN

## 2022-06-05 ENCOUNTER — Other Ambulatory Visit: Payer: Self-pay | Admitting: *Deleted

## 2022-06-05 ENCOUNTER — Telehealth: Payer: Self-pay

## 2022-06-05 DIAGNOSIS — K219 Gastro-esophageal reflux disease without esophagitis: Secondary | ICD-10-CM

## 2022-06-05 MED ORDER — FAMOTIDINE 20 MG PO TABS: 20.0000 mg | ORAL_TABLET | Freq: Every day | ORAL | 3 refills | Status: AC

## 2022-06-05 NOTE — Telephone Encounter (Signed)
LEFT MESSAGE FOR PT INFORMING HER THAT HER OTHER 2 NOVO NORDISK MEDICATIONS ARE READY FOR PICKUP.  [place] and Evaristo Bury are labeled and ready in med room fridge.

## 2022-06-12 NOTE — Telephone Encounter (Signed)
Patient returns call to nurse line. Advised that medication is ready for pick up.   Veronda Prude, RN

## 2022-06-14 DIAGNOSIS — E1142 Type 2 diabetes mellitus with diabetic polyneuropathy: Secondary | ICD-10-CM | POA: Diagnosis not present

## 2022-06-19 ENCOUNTER — Other Ambulatory Visit: Payer: Self-pay | Admitting: *Deleted

## 2022-06-19 DIAGNOSIS — E1169 Type 2 diabetes mellitus with other specified complication: Secondary | ICD-10-CM

## 2022-06-19 MED ORDER — ATORVASTATIN CALCIUM 40 MG PO TABS: ORAL_TABLET | ORAL | 3 refills | Status: DC

## 2022-06-21 NOTE — Progress Notes (Unsigned)
Cardiology Office Note:    Date:  06/22/2022   ID:  Cassandra Reid, DOB 09/11/1956, MRN [id number]  PCP:  Ezequiel Essex, [place] Prov[facility] Cardiologist:  Kirk Ruths, MD { Click to update primary MD,subspecialty MD or APP then REFRESH:1}    Referring MD: Ezequiel Essex, MD    CC: DOE, palpitations, and orthostatic dizziness  History of Present Illness:    Cassandra Reid is a 66 y.o. female with a hx of the following:   HTN T2DM with diabetic polyneuropathy, with long-term use of insulin CKD stage 3 d/t T2DM HLD d/t T2DM Paroxysmal Atrial Tachycardia Cerebral aneurysm without rupture Complex sleep apnea syndrome GERD Fatty Liver Encephalomalacia Lacunar infarction Legally blind Obesity Fibromyalgia Balance problems Anxiety Major Depressive Disorder  She was admitted to the hospital with chest pain in May 2021.  Troponins were normal.  Echocardiogram at that time showed normal LV function, mild LVH, grade 1 diastolic dysfunction, mild left atrial enlargement and trace mitral regurgitation.  A monitor performed on June 2021 showed sinus with PACs, PVCs and brief atrial tachycardia.  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  She was last seen in the office by Dr. Stanford Breed on June 13, 2021.  She reported she occasionally has some dyspnea on exertion but denied any PND, orthopnea, or pedal edema.  She reported she continues to have chronic occasional atypical chest pain in the left axillary area, and it was unchanged.  Twelve-lead EKG revealed normal sinus rhythm, normal axis, no ST changes.  No further ischemic evaluation was pursued at that point.  Most recent echocardiogram did not show evidence of mitral valve prolapse.  Her blood pressure was elevated at 148/80, Dr. Stanford Breed decided to increase her lisinopril to 20 mg once daily.  He was told to follow-up in 12  months.  Today she presents for follow-up.  Chief complaint today is dyspnea on exertion, palpitations, and orthostatic dizziness.  Says the symptoms have mainly been occurring for the past couple months.  She says she notices shortness of breath with exertion while walking and says she noticed palpitations occasionally while standing up and with exertion.  Says she can tell this on her Apple Watch with max heart rate going up to the 120s.  Continues to have chronic chest pain with pinching sensation on her left axilla under her left breast tissue and muscle spasms along the left upper side of her chest, says she has had this for years.  Attributes this to disc problems and myofascial pain dysfunction syndrome.  States she has some aches in her legs occasionally and has some pain with walking sometimes more on, she says heat and rest and elevation helps relieve this achiness in her legs.  Only takes Lasix as needed for swelling in her legs.  Tolerating her current medications okay.  Blood pressure is well controlled at home.  Does admit to feeling dizzy when changing positions, but denies any syncope, presyncope swelling, or significant weight changes.  She recently got a CPAP and says she is trying to wear this every day, however she occasionally finds herself ripping off her CPAP at night.  Denies any orthopnea or PND. Denies any other questions or concerns.   Past Medical History:  Diagnosis Date   Allergic rhinitis 09/26/2018   Anosmia    Arthritis    Asthma    Balance problems 09/22/2021  C. difficile colitis 07/18/2018   Cerebral aneurysm without rupture    Chest pain 03/26/2020   Chronic pain syndrome 04/23/2019   CKD stage 3 due to type 2 diabetes mellitus 04/15/2019   DDD (degenerative disc disease), cervical 04/16/2019   Encephalomalacia on imaging study 08/25/2020   Brain MRI 08/25/21: Few scattered areas of encephalomalacia and gliosis within the overlying left frontal lobe most likely  postoperative in nature.   Fatty liver 05/31/2020   Fibromyalgia    Former smoker    Generalized anxiety disorder    GERD (gastroesophageal reflux disease)    Heart murmur    History of chicken pox    History of colon polyps    Hyperlipidemia associated with type 2 diabetes mellitus 07/18/2018   Hypertension associated with diabetes 07/18/2018   IBS (irritable bowel syndrome)    Incontinence 09/22/2021   Bowel and bladder.    Ischemic brain injury 05/29/2021   Lacunar infarction 05/25/2021   Brain MRI 08/25/21: Few small remote lacunar infarcts noted at the right caudate and left internal capsule   Legally blind    Major depressive disorder    Migraines    Myalgia 04/23/2019   OSA (obstructive sleep apnea) 07/18/2018   not using CPAP machine (broken)   Osteoporosis 01/18/2021   Paroxysmal atrial tachycardia    On telemetry   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   Type 2 diabetes mellitus with diabetic polyneuropathy, with long-term current use of insulin 07/18/2018    Past Surgical History:  Procedure Laterality Date   ABDOMINAL HYSTERECTOMY  2012   APPENDECTOMY  1972   CEREBRAL ANEURYSM REPAIR Left 2004   Aneurysm clip in place near the level of the left ICA on Brain MRI 08/2020. Also coiling performed   CHOLECYSTECTOMY  2015   CRANIOTOMY Left    left pterional craniotomy   MENISCUS REPAIR Bilateral     Current Medications: Current Meds  Medication Sig   Ascorbic Acid (VITAMIN C ADULT GUMMIES PO) Take 1 each by mouth daily.   atorvastatin (LIPITOR) 40 MG tablet TAKE 1 TABLET BY MOUTH EVERYDAY AT BEDTIME   baclofen (LIORESAL) 10 MG tablet TAKE 1 TABLET BY MOUTH TWICE A DAY AS NEEDED FOR MUSCLE SPASM   BD PEN NEEDLE NANO 2ND GEN 32G X 4 MM MISC USE WITH INSULIN   blood glucose meter kit and supplies Dispense based on patient and insurance preference. Use up to four times daily as  directed. (FOR ICD-10 E10.9, E11.9).   Continuous Blood Gluc Receiver (DEXCOM G6 RECEIVER) DEVI Please use as directed to check blood sugar.   Continuous Blood Gluc Sensor (DEXCOM G6 SENSOR) MISC Use as directed to check blood sugar.   Continuous Blood Gluc Transmit (DEXCOM G6 TRANSMITTER) MISC Use as directed to check blood sugar.   dapagliflozin propanediol (FARXIGA) 10 MG TABS tablet Take 1 tablet (10 mg total) by mouth daily before breakfast. This is a patient assistance medication. Patient may not be approved and/or have medication. Please ask and verify when performing med review.   dicyclomine (BENTYL) 20 MG tablet Take 1 tablet (20 mg total) by mouth 2 (two) times daily as needed for spasms.   DULoxetine (CYMBALTA) 60 MG capsule Take 1 capsule (60 mg total) by mouth daily.   famotidine (PEPCID) 20 MG tablet Take 1 tablet (20 mg total) by mouth daily.   fluticasone (FLONASE) 50 MCG/ACT nasal spray Place 1  spray into both nostrils daily as needed for allergies or rhinitis.   gabapentin (NEURONTIN) 600 MG tablet TAKE 0.5-1 TABLET (300-600 MG TOTAL) BY MOUTH 3 (THREE) TIMES DAILY.   glucose blood test strip Use as instructed   insulin aspart (FIASP) 100 UNIT/ML FlexTouch Pen Inject 15 Units into the skin in the morning, at noon, and at bedtime. This is a patient assistance medication. Patient may not be approved and/or have medication. Please ask and verify when performing med review.   insulin degludec (TRESIBA) 100 UNIT/ML FlexTouch Pen Inject 45 Units into the skin daily. This is a patient assistance medication. Patient may not be approved and/or have medication. Please ask and verify when performing med review.   loratadine (CLARITIN) 10 MG tablet TAKE 1 TABLET EVERY DAY   LORazepam (ATIVAN) 0.5 MG tablet Take 1 tablet (0.5 mg total) by mouth every 8 (eight) hours as needed for anxiety. Start 03/22/2022   magnesium oxide (MAG-OX) 400 (241.3 Mg) MG tablet Take 1 tablet (400 mg total) by mouth 2  (two) times daily.   metFORMIN (GLUCOPHAGE-XR) 750 MG 24 hr tablet TAKE 1 TABLET (750 MG TOTAL) BY MOUTH 2 (TWO) TIMES DAILY WITH A MEAL.   Multiple Vitamin (MULTIVITAMIN PO) Take 1 tablet by mouth daily.   ondansetron (ZOFRAN) 4 MG tablet Take 1 tablet (4 mg total) by mouth every 6 (six) hours.   OneTouch Delica Lancets 15V MISC Please use to check blood sugar up to 4 times daily. E11.42   pantoprazole (PROTONIX) 40 MG tablet Take 1 tablet (40 mg total) by mouth daily.   Semaglutide, 2 MG/DOSE, (OZEMPIC, 2 MG/DOSE,) 8 MG/3ML SOPN Inject 2 mg into the skin once a week. On Friday   sucralfate (CARAFATE) 1 g tablet TAKE 1 TABLET FOUR TIMES DAILY, WITH MEALS AND AT BEDTIME (OKAY TO TRIAL OFF AS INSTRUCTED)   traZODone (DESYREL) 50 MG tablet TAKE 1/2 TABLET BY MOUTH AT BEDTIME AS NEEDED FOR SLEEP.   [DISCONTINUED] amLODipine (NORVASC) 10 MG tablet TAKE 1 TABLET BY MOUTH EVERY DAY   [DISCONTINUED] furosemide (LASIX) 40 MG tablet Take 1 tablet (40 mg total) by mouth daily. (Patient taking differently: Take 40 mg by mouth daily. As needed)   [DISCONTINUED] KLOR-CON M20 20 MEQ tablet TAKE 1 TABLET BY MOUTH TWICE A DAY   [DISCONTINUED] lisinopril (ZESTRIL) 10 MG tablet Take 1 tablet (10 mg total) by mouth daily.     Allergies:   Patient has no known allergies.   Social History   Socioeconomic History   Marital status: Divorced    Spouse name: John   Number of children: 3   Years of education: 14   Highest education level: Associate degree: academic program  Occupational History   Occupation: disabled  Tobacco Use   Smoking status: Former    Packs/day: 1.00    Years: 3.00    Total pack years: 3.00    Types: Cigarettes    Quit date: 06/1967    Years since quitting: 55.0    Passive exposure: Past   Smokeless tobacco: Never  Vaping Use   Vaping Use: Never used  Substance and Sexual Activity   Alcohol use: Never   Drug use: Not Currently   Sexual activity: Not Currently  Other Topics  Concern   Not on file  Social History Narrative   Patient lives in [place].    Patient walks her dog daily for exercise.    Patient is separated from her husband.   Patient does not  drive. Uses home delivery services and ub[employer].    Right handed   One story apartment   Caffeine on daily   Social Determinants of Health   Financial Resource Strain: Low Risk  (03/19/2022)   Overall Financial Resource Strain (CARDIA)    Difficulty of Paying Living Expenses: Not hard at all  Food Insecurity: No Food Insecurity (03/19/2022)   Hunger Vital Sign    Worried About Running Out of Food in the Last Year: Never true    Ran Out of Food in the Last Year: Never true  Transportation Needs: No Transportation Needs (03/19/2022)   PRAPARE - Hydrologist (Medical): No    Lack of Transportation (Non-Medical): No  Physical Activity: Sufficiently Active (03/19/2022)   Exercise Vital Sign    Days of Exercise per Week: 7 days    Minutes of Exercise per Session: 30 min  Stress: Stress Concern Present (03/19/2022)   Page    Feeling of Stress : Very much  Social Connections: Socially Isolated (03/19/2022)   Social Connection and Isolation Panel [NHANES]    Frequency of Communication with Friends and Family: Once a week    Frequency of Social Gatherings with Friends and Family: Once a week    Attends Religious Services: Never    Marine scientist or Organizations: No    Attends Music therapist: Never    Marital Status: Separated     Family History: The patient's family history includes Alcohol abuse in her maternal grandfather and mother; Breast cancer in her sister; COPD in her father; Cancer in her maternal grandfather, maternal grandmother, paternal grandfather, and paternal grandmother; Diabetes in her father, mother, sister, and sister; Early death in her father; Heart disease in her father;  Hyperlipidemia in her father; Hypertension in her father, mother, sister, and sister; Kidney disease in her mother and sister.  ROS:   Review of Systems  Constitutional:  Negative for chills, diaphoresis, fever, malaise/fatigue and weight loss.  HENT: Negative.    Eyes: Negative.   Respiratory:  Positive for shortness of breath. Negative for cough, hemoptysis, sputum production and wheezing.        See HPI.  Cardiovascular:  Positive for chest pain. Negative for palpitations, orthopnea, claudication, leg swelling and PND.       Chronic CP that is not cardiac related - See HPI.  Gastrointestinal: Negative.   Genitourinary: Negative.   Musculoskeletal:  Positive for myalgias. Negative for back pain, falls, joint pain and neck pain.       See HPI.  Skin:  Negative for itching and rash.  Neurological:  Positive for dizziness. Negative for tingling, tremors, sensory change, speech change, focal weakness, seizures, weakness and headaches.  Endo/Heme/Allergies:  Negative for environmental allergies and polydipsia. Does not bruise/bleed easily.  Psychiatric/Behavioral: Negative.     Please see the history of present illness.    All other systems reviewed and are negative.  EKGs/Labs/Other Studies Reviewed:    The following studies were reviewed today:   EKG:  EKG is ordered today.  The ekg ordered today demonstrates NSR, 85 bpm, low voltage QRS, Nonspecific T wave abnormality, otherwise no acute changes.   Cardiac telemetry monitor on May 03, 2020: 1.  Normal sinus rhythm with sinus bradycardia and sinus tachycardia. 2.  Frequent PACs and normal sinus atrial tachycardia 3.  Rare PVCs 4.  No atrial fibrillation 5.  No prolonged pauses 6.  Noise artifact.   Coronary CT on Mar 28, 2020: 1.  No evidence of coronary artery disease, CAD RADS = 0.  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 2.  Coronary calcium score of 0. 3.  Normal coronary origin with  right dominance. 4.  Hepatic steatosis noted.   2D echo completed on Mar 26, 2020: 1.  LVEF is 65 to 70%.  The left ventricle has normal function.  The left ventricle has no regional wall motion abnormalities.  There is mild left ventricular hypertrophy.  Left ventricular diastolic parameters are consistent with grade 1 diastolic dysfunction (impaired relaxation). 2.  Right ventricular systolic function is low normal.  The right ventricular size is normal. 3.  Left atrial size is mildly dilated. 4.  The mitral valve is grossly normal.  Trivial mitral valve regurgitation. 5.  The aortic valve is tricuspid.  Aortic valve regurgitation is not visualized.  Recent Labs: 10/15/2021: Hemoglobin 14.9; Platelets 181 05/21/2022: ALT 57; BUN 18; Creatinine, Ser 1.19; Potassium 4.7; Sodium 140  Recent Lipid Panel    Component Value Date/Time   CHOL 120 07/31/2019 1339   TRIG 242 (H) 07/31/2019 1339   HDL 42 (L) 07/31/2019 1339   CHOLHDL 2.9 07/31/2019 1339   VLDL 38.2 07/18/2018 0940   LDLCALC 48 07/31/2019 1339    Physical Exam:    VS:  BP 136/60   Pulse 85   Ht _0  (1.549 m)   Wt 175 lb (79.4 kg)   SpO2 97%   BMI 33.07 kg/m     Orthostatic VS for the past 24 hrs (Last 3 readings):  BP- Lying Pulse- Lying BP- Sitting Pulse- Sitting BP- Standing at 0 minutes Pulse- Standing at 0 minutes BP- Standing at 3 minutes Pulse- Standing at 3 minutes  06/22/22 1420 143/83 83 130/79 83 106/69 90 119/79 96     Wt Readings from Last 3 Encounters:  06/22/22 175 lb (79.4 kg)  05/28/22 170 lb (77.1 kg)  04/17/22 175 lb (79.4 kg)     GEN: Well nourished, well developed in no acute distress HEENT: Normal NECK: No JVD; No carotid bruits CARDIAC: RRR, no murmurs, rubs, gallops; 2+ pulses throughout, strong and equal bilaterally RESPIRATORY:  Clear to auscultation without rales, wheezing or rhonchi  ABDOMEN: Soft, non-tender, non-distended, bowel sounds X 4 MUSCULOSKELETAL:  No edema; No deformity   SKIN: Warm and dry NEUROLOGIC:  Alert and oriented x 3 PSYCHIATRIC:  Normal affect   ASSESSMENT:    1. Dyspnea on exertion   2. DOE (dyspnea on exertion)   3. Orthostatic hypertension   4. CKD stage 3 due to type 2 diabetes mellitus ([place])   5. Hypokalemia due to excessive renal loss of potassium   6. Essential (primary) hypertension    PLAN:    In order of problems listed above:  Dyspnea on exertion - acute, stable Says she notices this with activity, states this has been stable over the past couple of months.  Notices most with walking.  We will repeat 2D complete echo and obtain TSH and CBC.  Discussed to seek ED care if shortness of breath worsens or does not improve.  She verbalizes understanding.  2.  Palpitations-acute, stable She has been noticing this when she stands up and can see this on her Apple Watch.  I asked her if she was able to pull the readings off her Apple Watch but she cannot do this at this time.  I  explained that there is a way that she can transfer the readings from her iPhone and send this to [place] via MyChart and she states that she will try to do this.  We will arrange a 14-day ZIO monitor to evaluate for any arrhythmias and will obtain magnesium, CBC, and BMET level.  We will continue to monitor.  3.  Orthostatic hypotension-acute, stable Orthostatic blood pressures were obtained and were positive as noted above.  We will decrease amlodipine from 10 mg to 5 mg daily and increase lisinopril from 10 mg to 20 mg daily.  Discussed conservative measures of changing positions slowly and wearing compression stockings during the day.  I told her to monitor her blood pressure for the next 2 weeks and let me know her readings via MyChart. She verbalizes understanding.    4. Hypertension - chronic, stable Systolic blood pressures are in the mid 130s.  We will adjust her amlodipine and lisinopril as mentioned above. Discussed to monitor BP at home at least 2 hours after  medications and sitting for 5-10 minutes.  She will monitor her blood pressure at home for the next 2 weeks and let me know her readings via MyChart.  We will check BMET.  5. CKD stage 3 - chronic, stable Most recent creatinine level on May 21, 2022 was 1.190 with EGFR at 51.  We will recheck be met today. Lasix will be changed to as needed as well as potassium as needed. Increase lisinopril as mentioned above and decrease amlodipine as mentioned above.  We will continue to monitor.  Continue Lipitor 40 mg daily.  6. Hyperlipidemia - chronic, stable Last LDL was stable at 48 in 2020.  We will recheck fasting lipid panel.   7. Disposition: F/U in 4-6 months or sooner if anything changes.   Medication Adjustments/Labs and Tests Ordered: Current medicines are reviewed at length with the patient today.  Concerns regarding medicines are outlined above.  Orders Placed This Encounter  Procedures   TSH   CBC   Magnesium   Lipid Profile   Basic metabolic panel   LONG TERM MONITOR (3-14 DAYS)   EKG 12-Lead   ECHOCARDIOGRAM COMPLETE   Meds ordered this encounter  Medications   furosemide (LASIX) 40 MG tablet    Sig: Take 1 tablet (40 mg total) by mouth as needed for edema.    Dispense:  90 tablet    Refill:  3   potassium chloride SA (KLOR-CON M20) 20 MEQ tablet    Sig: Take 1 tablet (20 mEq total) by mouth as needed (TAKE WITH LASIX PRN EDEMA).    Dispense:  180 tablet    Refill:  1   amLODipine (NORVASC) 5 MG tablet    Sig: Take 1 tablet (5 mg total) by mouth daily.    Dispense:  30 tablet    Refill:  9   lisinopril (ZESTRIL) 20 MG tablet    Sig: Take 1 tablet (20 mg total) by mouth daily.    Dispense:  30 tablet    Refill:  9    Patient Instructions  Medication Instructions:  TAKE FUROSEMIDE AND POTASSIUM AS NEEDED-FOR SWELLING  DECREASE AMLODIPINE 5MG DAILY  INCREASE LISINOPRIL 20MG DAILY   *If you need a refill on your cardiac medications before your next appointment,  please call your pharmacy*  Lab Work: FASTING LIPID, TSH, MAG AND CBC WITH YOUR OTHER LABS  If you have labs (blood work) drawn today and your tests are completely normal, you  will receive your results only by: [facility] (if you have MyChart) OR A paper copy in the mail If you have any lab test that is abnormal or we need to change your treatment, we will call you to review the results.  Testing/Procedures: Your physician has requested you wear a ZIO patch monitor for 14 days. SEE BELOW  Echocardiogram (SCHEDULE AFTER MONITOR ABOUT 6 WEEKS) - Your physician has requested that you have an echocardiogram. Echocardiography is a painless test that uses sound waves to create images of your heart. It provides your doctor with information about the size and shape of your heart and how well your heart's chambers and valves are working. This procedure takes approximately one hour. There are no restrictions for this procedure.   Special Instructions PLEASE PURCHASE AND WEAR COMPRESSION STOCKINGS DAILY AND TAKE OFF AT BEDTIME. Compression stockings are elastic socks that squeeze the legs. They help to increase blood flow to the legs and to decrease swelling in the legs from fluid retention, and reduce the chance of developing blood clots in the lower legs. Please put on in the AM when dressing and off at night when dressing for bed.  LET THEM KNOW THAT YOU NEED KNEE HIGH'S WITH COMPRESSION OF 15-20 mmhg.  [employer]  [employer] ([place] [phone]); [place], [place] [address]; [phone]  EMAIL   [email or web address].  TAKE AND LOG YOUR BLOOD PRESSURE-CONTACT WITH READING IN 2 WEEKS  Follow-Up: Your next appointment:  4-6 month(s) In Person with Kirk Ruths, MD   At [facility], you and your health needs are our priority.  As part of our continuing mission to provide you with exceptional heart care, we have created designated Provider Care Teams.  These Care Teams include  your primary Cardiologist (physician) and Advanced Practice Providers (APPs -  Physician Assistants and Nurse Practitioners) who all work together to provide you with the care you need, when you need it.  Important Information About Sugar      ZIO XT- Long Term Monitor Instructions  Your physician has requested you wear a ZIO patch monitor for 14 days.  This is a single patch monitor. [employer] supplies one patch monitor per enrollment. Additional stickers are not available. Please do not apply patch if you will be having a Nuclear Stress Test,  Echocardiogram, Cardiac CT, MRI, or Chest Xray during the period you would be wearing the  monitor. The patch cannot be worn during these tests. You cannot remove and re-apply the  ZIO XT patch monitor.  Your ZIO patch monitor will be mailed 3 day USPS to your address on file. It may take 3-5 days  to receive your monitor after you have been enrolled.  Once you have received your monitor, please review the enclosed instructions. Your monitor  has already been registered assigning a specific monitor serial # to you.  Billing and Patient Assistance Program Information  We have supplied [employer] with any of your insurance information on file for billing purposes. [employer] offers a sliding scale Patient Assistance Program for patients that do not have  insurance, or whose insurance does not completely cover the cost of the ZIO monitor.  You must apply for the Patient Assistance Program to qualify for this discounted rate.  To apply, please call [employer] at [phone], select option 4, select option 2, ask to apply for  Patient Assistance Program. Theodore Demark will ask your household income, and how many people  are in your household. They will  quote your out-of-pocket cost based on that information.  [employer] will also be able to set up a 69-month interest-free payment plan if needed.  Applying the monitor   Shave hair from upper left chest.  Hold  abrader disc by orange tab. Rub abrader in 40 strokes over the upper left chest as  indicated in your monitor instructions.  Clean area with 4 enclosed alcohol pads. Let dry.  Apply patch as indicated in monitor instructions. Patch will be placed under collarbone on left  side of chest with arrow pointing upward.  Rub patch adhesive wings for 2 minutes. Remove white label marked "1". Remove the white  label marked "2". Rub patch adhesive wings for 2 additional minutes.  While looking in a mirror, press and release button in center of patch. A small green light will  flash 3-4 times. This will be your only indicator that the monitor has been turned on.  Do not shower for the first 24 hours. You may shower after the first 24 hours.  Press the button if you feel a symptom. You will hear a small click. Record Date, Time and  Symptom in the Patient Logbook.  When you are ready to remove the patch, follow instructions on the last 2 pages of Patient  Logbook. Stick patch monitor onto the last page of Patient Logbook.  Place Patient Logbook in the blue and white box. Use locking tab on box and tape box closed  securely. The blue and white box has prepaid postage on it. Please place it in the mailbox as  soon as possible. Your physician should have your test results approximately 7 days after the  [facility]  Call [place]at [phone] you have questions regarding  your ZIO XT patch monitor. Call them immediately if you see an orange light blinking on your  monitor.  If your monitor falls off in less than 4 days, contact our Monitor department at [phone]  If your monitor becomes loose or falls off after 4 days call [employer] at [phone]  suggestions on securing your monitor    Signed, EFinis Bud NP  06/22/2022 2:25 PM    [place]

## 2022-06-22 ENCOUNTER — Encounter: Payer: Self-pay | Admitting: General Practice

## 2022-06-22 ENCOUNTER — Ambulatory Visit: Payer: Medicare PPO | Admitting: Nurse Practitioner

## 2022-06-22 ENCOUNTER — Ambulatory Visit (INDEPENDENT_AMBULATORY_CARE_PROVIDER_SITE_OTHER): Payer: Medicare PPO

## 2022-06-22 VITALS — BP 136/60 | HR 85 | Ht 61.0 in | Wt 175.0 lb

## 2022-06-22 DIAGNOSIS — E785 Hyperlipidemia, unspecified: Secondary | ICD-10-CM | POA: Diagnosis not present

## 2022-06-22 DIAGNOSIS — E1122 Type 2 diabetes mellitus with diabetic chronic kidney disease: Secondary | ICD-10-CM | POA: Diagnosis not present

## 2022-06-22 DIAGNOSIS — E876 Hypokalemia: Secondary | ICD-10-CM | POA: Diagnosis not present

## 2022-06-22 DIAGNOSIS — N183 Chronic kidney disease, stage 3 unspecified: Secondary | ICD-10-CM

## 2022-06-22 DIAGNOSIS — I10 Essential (primary) hypertension: Secondary | ICD-10-CM

## 2022-06-22 DIAGNOSIS — R0609 Other forms of dyspnea: Secondary | ICD-10-CM

## 2022-06-22 MED ORDER — LISINOPRIL 20 MG PO TABS: 20.0000 mg | ORAL_TABLET | Freq: Every day | ORAL | 9 refills | Status: DC

## 2022-06-22 MED ORDER — POTASSIUM CHLORIDE CRYS ER 20 MEQ PO TBCR: 20.0000 meq | EXTENDED_RELEASE_TABLET | ORAL | 1 refills | Status: DC | PRN

## 2022-06-22 MED ORDER — AMLODIPINE BESYLATE 5 MG PO TABS: 5.0000 mg | ORAL_TABLET | Freq: Every day | ORAL | 9 refills | Status: DC

## 2022-06-22 MED ORDER — FUROSEMIDE 40 MG PO TABS: 40.0000 mg | ORAL_TABLET | ORAL | 3 refills | Status: DC | PRN

## 2022-06-22 NOTE — Patient Instructions (Signed)
Medication Instructions:  TAKE FUROSEMIDE AND POTASSIUM AS NEEDED-FOR SWELLING  DECREASE AMLODIPINE 5MG  DAILY  INCREASE LISINOPRIL 20MG  DAILY   *If you need a refill on your cardiac medications before your next appointment, please call your pharmacy*  Lab Work: FASTING LIPID, TSH, MAG AND CBC WITH YOUR OTHER LABS  If you have labs (blood work) drawn today and your tests are completely normal, you will receive your results only by: MyChart Message (if you have MyChart) OR A paper copy in the mail If you have any lab test that is abnormal or we need to change your treatment, we will call you to review the results.  Testing/Procedures: Your physician has requested you wear a ZIO patch monitor for 14 days. SEE BELOW  Echocardiogram (SCHEDULE AFTER MONITOR ABOUT 6 WEEKS) - Your physician has requested that you have an echocardiogram. Echocardiography is a painless test that uses sound waves to create images of your heart. It provides your doctor with information about the size and shape of your heart and how well your heart's chambers and valves are working. This procedure takes approximately one hour. There are no restrictions for this procedure.   Special Instructions PLEASE PURCHASE AND WEAR COMPRESSION STOCKINGS DAILY AND TAKE OFF AT BEDTIME. Compression stockings are elastic socks that squeeze the legs. They help to increase blood flow to the legs and to decrease swelling in the legs from fluid retention, and reduce the chance of developing blood clots in the lower legs. Please put on in the AM when dressing and off at night when dressing for bed.  LET THEM KNOW THAT YOU NEED KNEE HIGH'S WITH COMPRESSION OF 15-20 mmhg.  [employer]  [address] ([phone]); [place], [address]; [phone]  EMAIL   [email or web address].  TAKE AND LOG YOUR BLOOD PRESSURE-CONTACT WITH READING IN 2 WEEKS  Follow-Up: Your next appointment:  4-6 month(s) In Person with [id number], MD   At [facility], you and your health needs are our priority.  As part of our continuing mission to provide you with exceptional heart care, we have created designated Provider Care Teams.  These Care Teams include your primary Cardiologist (physician) and Advanced Practice Providers (APPs -  Physician Assistants and Nurse Practitioners) who all work together to provide you with the care you need, when you need it.  Important Information About Sugar      ZIO XT- Long Term Monitor Instructions  Your physician has requested you wear a ZIO patch monitor for 14 days.  This is a single patch monitor. [employer] supplies one patch monitor per enrollment. Additional stickers are not available. Please do not apply patch if you will be having a Nuclear Stress Test,  Echocardiogram, Cardiac CT, MRI, or Chest Xray during the period you would be wearing the  monitor. The patch cannot be worn during these tests. You cannot remove and re-apply the  ZIO XT patch monitor.  Your ZIO patch monitor will be mailed 3 day USPS to your address on file. It may take 3-5 days  to receive your monitor after you have been enrolled.  Once you have received your monitor, please review the enclosed instructions. Your monitor  has already been registered assigning a specific monitor serial # to you.  Billing and Patient Assistance Program Information  We have supplied [employer] with any of your insurance information on file for billing purposes. [employer] offers a sliding scale Patient Assistance Program for patients that do not have  insurance, or whose insurance does not completely cover the cost of the ZIO monitor.  You must apply for the Patient Assistance Program to qualify for this discounted rate.  To apply, please call [employer] at [phone], select option 4, select option 2, ask to apply for  Patient Assistance Program. Meredeth Ide will ask your household income, and how many people  are in your  household. They will quote your out-of-pocket cost based on that information.  [employer] will also be able to set up a 70-month, interest-free payment plan if needed.  Applying the monitor   Shave hair from upper left chest.  Hold abrader disc by orange tab. Rub abrader in 40 strokes over the upper left chest as  indicated in your monitor instructions.  Clean area with 4 enclosed alcohol pads. Let dry.  Apply patch as indicated in monitor instructions. Patch will be placed under collarbone on left  side of chest with arrow pointing upward.  Rub patch adhesive wings for 2 minutes. Remove white label marked "1". Remove the white  label marked "2". Rub patch adhesive wings for 2 additional minutes.  While looking in a mirror, press and release button in center of patch. A small green light will  flash 3-4 times. This will be your only indicator that the monitor has been turned on.  Do not shower for the first 24 hours. You may shower after the first 24 hours.  Press the button if you feel a symptom. You will hear a small click. Record Date, Time and  Symptom in the Patient Logbook.  When you are ready to remove the patch, follow instructions on the last 2 pages of Patient  Logbook. Stick patch monitor onto the last page of Patient Logbook.  Place Patient Logbook in the blue and white box. Use locking tab on box and tape box closed  securely. The blue and white box has prepaid postage on it. Please place it in the mailbox as  soon as possible. Your physician should have your test results approximately 7 days after the  monitor has been mailed back to [facility].  Call [facility] Customer Care at [phone] if you have questions regarding  your ZIO XT patch monitor. Call them immediately if you see an orange light blinking on your  monitor.  If your monitor falls off in less than 4 days, contact our Monitor department at [phone].  If your monitor becomes loose or falls off after  4 days call [employer] at [phone] for  suggestions on securing your monitor

## 2022-06-22 NOTE — Progress Notes (Unsigned)
ZIO XT 14 day mailed to pt's home address.   DOD to read

## 2022-06-26 DIAGNOSIS — R0609 Other forms of dyspnea: Secondary | ICD-10-CM | POA: Diagnosis not present

## 2022-06-26 DIAGNOSIS — I10 Essential (primary) hypertension: Secondary | ICD-10-CM | POA: Diagnosis not present

## 2022-06-26 DIAGNOSIS — E785 Hyperlipidemia, unspecified: Secondary | ICD-10-CM | POA: Diagnosis not present

## 2022-06-26 DIAGNOSIS — E1122 Type 2 diabetes mellitus with diabetic chronic kidney disease: Secondary | ICD-10-CM | POA: Diagnosis not present

## 2022-06-26 DIAGNOSIS — N183 Chronic kidney disease, stage 3 unspecified: Secondary | ICD-10-CM | POA: Diagnosis not present

## 2022-06-26 LAB — BASIC METABOLIC PANEL
BUN/Creatinine Ratio: 14 (ref 12–28)
BUN: 16 mg/dL (ref 8–27)
CO2: 20 mmol/L (ref 20–29)
Calcium: 9.6 mg/dL (ref 8.7–10.3)
Chloride: 105 mmol/L (ref 96–106)
Creatinine, Ser: 1.16 mg/dL — ABNORMAL HIGH (ref 0.57–1.00)
Glucose: 105 mg/dL — ABNORMAL HIGH (ref 70–99)
Potassium: 3.8 mmol/L (ref 3.5–5.2)
Sodium: 143 mmol/L (ref 134–144)
eGFR: 53 mL/min/{1.73_m2} — ABNORMAL LOW (ref >59)

## 2022-06-27 DIAGNOSIS — R0609 Other forms of dyspnea: Secondary | ICD-10-CM

## 2022-06-27 LAB — CBC
Hematocrit: 46.9 % — ABNORMAL HIGH (ref 34.0–46.6)
Hemoglobin: 15 g/dL (ref 11.1–15.9)
MCH: 28.3 pg (ref 26.6–33.0)
MCHC: 32 g/dL (ref 31.5–35.7)
MCV: 89 fL (ref 79–97)
Platelets: 167 10*3/uL (ref 150–450)
RBC: 5.3 x10E6/uL — ABNORMAL HIGH (ref 3.77–5.28)
RDW: 14.2 % (ref 11.7–15.4)
WBC: 5.8 10*3/uL (ref 3.4–10.8)

## 2022-06-27 LAB — LIPID PANEL
Chol/HDL Ratio: 3.8 ratio (ref 0.0–4.4)
Cholesterol, Total: 184 mg/dL (ref 100–199)
HDL: 48 mg/dL (ref >39)
LDL Chol Calc (NIH): 92 mg/dL (ref 0–99)
Triglycerides: 266 mg/dL — ABNORMAL HIGH (ref 0–149)
VLDL Cholesterol Cal: 44 mg/dL — ABNORMAL HIGH (ref 5–40)

## 2022-06-27 LAB — TSH: TSH: 1.5 u[IU]/mL (ref 0.450–4.500)

## 2022-06-27 LAB — MAGNESIUM: Magnesium: 2.1 mg/dL (ref 1.6–2.3)

## 2022-07-09 DIAGNOSIS — I951 Orthostatic hypotension: Secondary | ICD-10-CM

## 2022-07-10 ENCOUNTER — Encounter: Payer: Self-pay | Admitting: Family Medicine

## 2022-07-10 DIAGNOSIS — I951 Orthostatic hypotension: Secondary | ICD-10-CM | POA: Insufficient documentation

## 2022-07-10 NOTE — Telephone Encounter (Signed)
Forward for review and response 

## 2022-07-13 ENCOUNTER — Ambulatory Visit: Payer: Medicare PPO | Admitting: Pulmonary Disease

## 2022-07-13 MED ORDER — LISINOPRIL 40 MG PO TABS: 40.0000 mg | ORAL_TABLET | Freq: Every day | ORAL | 3 refills | Status: DC

## 2022-07-13 NOTE — Telephone Encounter (Signed)
Spoke to Patient , per Lanora Manis,  instruction  given  for medication changes, continue taking blood pressure readings Patient verbalized understanding.

## 2022-07-13 NOTE — Telephone Encounter (Signed)
   Thanks for sending this over to me.  When I last saw her in the office, I diagnosed her with orthostatic hypotension.  Lets stop amlodipine and increase lisinopril from 20 mg to 40 mg daily.  Continue to have her monitor her blood pressures daily as she's been doing, including orthostatics.  If she still notices positive orthostatics within 2 weeks time, she needs to let me know via MyChart.  Lets continue to encourage her to do conservative measures including adequate hydration, wearing compression stockings, and changing positions slowly.   If her symptoms worsen or change, she needs to let us know.   Thanks!   Kind Regards,  Sharlene Dory, NP

## 2022-07-16 ENCOUNTER — Telehealth (INDEPENDENT_AMBULATORY_CARE_PROVIDER_SITE_OTHER): Payer: Medicare PPO | Admitting: Family Medicine

## 2022-07-16 DIAGNOSIS — K760 Fatty (change of) liver, not elsewhere classified: Secondary | ICD-10-CM

## 2022-07-16 DIAGNOSIS — E1142 Type 2 diabetes mellitus with diabetic polyneuropathy: Secondary | ICD-10-CM

## 2022-07-16 DIAGNOSIS — N183 Chronic kidney disease, stage 3 unspecified: Secondary | ICD-10-CM | POA: Diagnosis not present

## 2022-07-16 DIAGNOSIS — E785 Hyperlipidemia, unspecified: Secondary | ICD-10-CM | POA: Diagnosis not present

## 2022-07-16 DIAGNOSIS — E1122 Type 2 diabetes mellitus with diabetic chronic kidney disease: Secondary | ICD-10-CM

## 2022-07-16 DIAGNOSIS — Z794 Long term (current) use of insulin: Secondary | ICD-10-CM | POA: Diagnosis not present

## 2022-07-16 DIAGNOSIS — E1169 Type 2 diabetes mellitus with other specified complication: Secondary | ICD-10-CM | POA: Diagnosis not present

## 2022-07-16 MED ORDER — EZETIMIBE 10 MG PO TABS: 10.0000 mg | ORAL_TABLET | Freq: Every day | ORAL | 3 refills | Status: DC

## 2022-07-16 NOTE — Patient Instructions (Signed)
It was wonderful to see you virtually today!  Cholesterol Keep up your great exercise routine!  I am happy to hear you are enjoying working out in the pool.  Keep up your diet changes as well.  Your blood sugar on the labs look much better than it has before.  Start Zetia 10 mg daily.  I have sent the medication into your mail-in pharmacy.  We will recheck your cholesterol in about 3 months, which should be in November.  Please come back for lab only appointment at that time.  A1c-we will check your A1c when you come in for the cholesterol check too.     Please bring all of your medications to every appointment!  If you have any questions or concerns, please do not hesitate to contact us via phone or MyChart message.   Fayette Pho, MD

## 2022-07-16 NOTE — Assessment & Plan Note (Signed)
LDL above goal, currently 92.  Patient tolerating atorvastatin 40 mg well.  She recalls being reduced from 80 mg of atorvastatin to 40 mg due to side effects.  She is doing quite well with increased exercise and healthier diet.  After discussion and shared decision making, she would like to try Zetia in addition to atorvastatin 40 mg.  Recheck LDL in 3 months.

## 2022-07-16 NOTE — Progress Notes (Signed)
[facility] Telemedicine Visit  Patient consented to have virtual visit and was identified by name and date of birth. Method of visit: Video  Encounter participants: Patient: Cassandra Reid - located at home Provider: Fayette Pho - located at [facility] Others (if applicable): None  Chief Complaint: Follow-up directed by cardiology provider  HPI: [name] is a pleasant 66 year old female who presents for follow-up at the direction of her cardiology nurse practitioner.  In early August, cardiology office obtained labs including BMP, CBC, TSH, magnesium, and lipid profile.  At that visit, her cardiology provider decreased amlodipine from 10 mg to 5 mg and increased lisinopril from 10 mg to 40 mg.  Subsequent MyChart message indicates based on home blood pressure measurements, cardiology NP decided to stop amlodipine and increase lisinopril to 40 mg.  Today patient reports doing well with these changes.  No adverse side effects.  She is presenting today regarding abnormal labs. When she asked questions about abnormalities - mainly RBC, hematocrit, and triglycerides - the cardiology NP directed her to follow-up with her PCP.  Values below. Lab Results  Component Value Date   CHOL 184 06/26/2022   HDL 48 06/26/2022   LDLCALC 92 06/26/2022   TRIG 266 (H) 06/26/2022   CHOLHDL 3.8 06/26/2022      Component Value Date/Time   WBC 5.8 06/26/2022 0824   WBC 8.8 10/15/2021 1430   RBC 5.30 (H) 06/26/2022 0824   RBC 5.38 (H) 10/15/2021 1430   HGB 15.0 06/26/2022 0824   HCT 46.9 (H) 06/26/2022 0824   PLT 167 06/26/2022 0824   MCV 89 06/26/2022 0824      Latest Ref Rng & Units 06/26/2022    8:23 AM 05/21/2022   12:10 PM 10/15/2021    2:30 PM  BMP  Glucose 70 - 99 mg/dL 161  096  045   BUN 8 - 27 mg/dL 16  18  20    Creatinine 0.57 - 1.00 mg/dL  4.09  8.11   BUN/Creat Ratio 12 - 28 14  15     Sodium 134 - 144 mmol/L 143  140  139    Potassium 3.5 - 5.2 mmol/L 3.8  4.7  3.5   Chloride 96 - 106 mmol/L 105  103  104   CO2 20 - 29 mmol/L 20  19  22    Calcium 8.7 - 10.3 mg/dL 9.6  9.14     ROS: per HPI  Pertinent PMHx: HTN, cerebral aneurysm without rupture, lacunar infarct, encephalomalacia on imaging, orthostatic hypotension, paroxysmal atrial tachycardia, T2DM, HLD, CKD 3, fatty liver  Exam:  There were no vitals taken for this visit.  Respiratory: Speaking in full sentences, no respiratory distress  Assessment/Plan:  HLD, secondary prevention, LDL goal <70 LDL above goal, currently 92.  Patient tolerating atorvastatin 40 mg well.  She recalls being reduced from 80 mg of atorvastatin to 40 mg due to side effects.  She is doing quite well with increased exercise and healthier diet.  After discussion and shared decision making, she would like to try Zetia in addition to atorvastatin 40 mg.  Recheck LDL in 3 months.  CKD stage 3 due to type 2 diabetes mellitus Creatinine stable from last check, only up 0.1 from a year ago (likely due to lisinopril).  No changes to regimen indicated at this time.  Discussed avoidance of NSAIDs.  Plan to recheck creatinine every 6 months or so, although cardiology provider will likely  check sooner due to recent lisinopril change.  Type 2 diabetes mellitus with diabetic polyneuropathy, with long-term current use of insulin Last A1c in July.  We can plan to recheck A1c in 3 months with her direct LDL.  Will order future lab.    Time spent during visit with patient: 12 minutes  Fayette Pho, MD  PGY-3 [facility]

## 2022-07-16 NOTE — Assessment & Plan Note (Signed)
Creatinine stable from last check, only up 0.1 from a year ago (likely due to lisinopril).  No changes to regimen indicated at this time.  Discussed avoidance of NSAIDs.  Plan to recheck creatinine every 6 months or so, although cardiology provider will likely check sooner due to recent lisinopril change.

## 2022-07-16 NOTE — Assessment & Plan Note (Signed)
Last A1c in July.  We can plan to recheck A1c in 3 months with her direct LDL.  Will order future lab.

## 2022-07-18 DIAGNOSIS — R0609 Other forms of dyspnea: Secondary | ICD-10-CM | POA: Diagnosis not present

## 2022-07-19 DIAGNOSIS — M797 Fibromyalgia: Secondary | ICD-10-CM | POA: Diagnosis not present

## 2022-07-19 DIAGNOSIS — I10 Essential (primary) hypertension: Secondary | ICD-10-CM | POA: Diagnosis not present

## 2022-07-19 DIAGNOSIS — M545 Low back pain, unspecified: Secondary | ICD-10-CM | POA: Diagnosis not present

## 2022-07-19 DIAGNOSIS — E119 Type 2 diabetes mellitus without complications: Secondary | ICD-10-CM | POA: Diagnosis not present

## 2022-07-20 ENCOUNTER — Telehealth: Payer: Self-pay | Admitting: Nurse Practitioner

## 2022-07-20 DIAGNOSIS — Z79899 Other long term (current) drug therapy: Secondary | ICD-10-CM

## 2022-07-20 DIAGNOSIS — I10 Essential (primary) hypertension: Secondary | ICD-10-CM

## 2022-07-20 NOTE — Telephone Encounter (Deleted)
Called and spoke with patient and updated her regarding her 14-day ZIO monitor.  Very reassuring result, was mainly in normal sinus rhythm.  Some SVT runs were noted however image results did not appear to be true SVT and actually rather appeared to be artifact.  Says she has had no more chest pain for the past several weeks.  Orthostatic dizziness is improving and blood pressures are stable per her report.  She is wearing compression stockings and limiting salt in her diet as discussed at last office visit.  She had a mechanical fall and lost her balance a little while ago and went to urgent care, did not have any fracture or fall but threw out her back.  Says she will refer to urgent care if she needs a physical therapy referral in the next few weeks.  She is on muscle relaxer and wearing a brace.  Updated regarding plan of care and would like to repeat BMET within 1 next Friday.  She verbalized understanding of our conversation and was appreciative of my call.  Sharlene Dory, NP

## 2022-07-20 NOTE — Telephone Encounter (Signed)
Called and spoke with patient and updated her regarding her 14-day ZIO monitor.  Very reassuring result, was mainly in normal sinus rhythm.  Some SVT runs were noted however image results did not appear to be true SVT and actually rather appeared to be artifact.  Says she has had no more chest pain for the past several weeks.  Orthostatic dizziness is improving and blood pressures are stable per her report.  She is wearing compression stockings and limiting salt in her diet as discussed at last office visit.  She had a mechanical fall and lost her balance a little while ago and went to urgent care, did not have any fracture but threw out her back.  Says she will refer to urgent care if she needs a physical therapy referral in the next few weeks.  She is on muscle relaxer and wearing a brace.  Updated regarding plan of care and would like to repeat BMET within 1 next Friday.  She verbalized understanding of our conversation and was appreciative of my call.

## 2022-07-24 ENCOUNTER — Other Ambulatory Visit: Payer: Self-pay | Admitting: *Deleted

## 2022-07-24 DIAGNOSIS — M797 Fibromyalgia: Secondary | ICD-10-CM

## 2022-07-24 DIAGNOSIS — F32A Depression, unspecified: Secondary | ICD-10-CM

## 2022-07-24 DIAGNOSIS — G894 Chronic pain syndrome: Secondary | ICD-10-CM

## 2022-07-24 MED ORDER — GABAPENTIN 600 MG PO TABS: ORAL_TABLET | ORAL | 3 refills | Status: DC

## 2022-07-24 MED ORDER — PANTOPRAZOLE SODIUM 40 MG PO TBEC
40.0000 mg | DELAYED_RELEASE_TABLET | Freq: Every day | ORAL | 0 refills | Status: DC
Start: 2022-07-24 — End: 2022-12-10

## 2022-07-24 MED ORDER — LORAZEPAM 0.5 MG PO TABS: 0.5000 mg | ORAL_TABLET | Freq: Three times a day (TID) | ORAL | 0 refills | Status: DC | PRN

## 2022-08-03 ENCOUNTER — Encounter: Payer: Medicare PPO | Admitting: Physical Medicine and Rehabilitation

## 2022-08-03 ENCOUNTER — Other Ambulatory Visit: Payer: Self-pay

## 2022-08-03 ENCOUNTER — Telehealth: Payer: Self-pay | Admitting: Cardiology

## 2022-08-03 ENCOUNTER — Ambulatory Visit (HOSPITAL_COMMUNITY): Payer: Medicare PPO | Attending: Nurse Practitioner

## 2022-08-03 ENCOUNTER — Encounter: Payer: Self-pay | Admitting: Family Medicine

## 2022-08-03 DIAGNOSIS — I10 Essential (primary) hypertension: Secondary | ICD-10-CM | POA: Diagnosis not present

## 2022-08-03 DIAGNOSIS — Z79899 Other long term (current) drug therapy: Secondary | ICD-10-CM | POA: Diagnosis not present

## 2022-08-03 DIAGNOSIS — R0609 Other forms of dyspnea: Secondary | ICD-10-CM | POA: Insufficient documentation

## 2022-08-03 LAB — ECHOCARDIOGRAM COMPLETE
Area-P 1/2: 3.48 cm2
S' Lateral: 2.5 cm

## 2022-08-03 NOTE — Addendum Note (Signed)
Addended by: Alyson Ingles on: 08/03/2022 12:22 PM   Modules accepted: Orders

## 2022-08-03 NOTE — Telephone Encounter (Signed)
Patient is at [facility] and is following up on order for lab work.

## 2022-08-03 NOTE — Telephone Encounter (Signed)
Pt is at the [facility] st lab and they need lab orders. Entered an released today. Lab will call back is they need anything.

## 2022-08-04 LAB — BASIC METABOLIC PANEL
BUN/Creatinine Ratio: 19 (ref 12–28)
BUN: 20 mg/dL (ref 8–27)
CO2: 20 mmol/L (ref 20–29)
Calcium: 9.9 mg/dL (ref 8.7–10.3)
Chloride: 105 mmol/L (ref 96–106)
Creatinine, Ser: 1.05 mg/dL — ABNORMAL HIGH (ref 0.57–1.00)
Glucose: 153 mg/dL — ABNORMAL HIGH (ref 70–99)
Potassium: 4.2 mmol/L (ref 3.5–5.2)
Sodium: 141 mmol/L (ref 134–144)
eGFR: 59 mL/min/{1.73_m2} — ABNORMAL LOW (ref >59)

## 2022-08-09 ENCOUNTER — Telehealth: Payer: Self-pay | Admitting: Nurse Practitioner

## 2022-08-09 NOTE — Telephone Encounter (Signed)
S/w patient regarding her recent concern and went over 2D echo results with her. Went over the causes for orthostatic hypotension. Pt states compression stockings are helping her orthostatic hypotension. Says she is doing very well. Takes her time to change positions slowly. No cardiac complaints or concerns. Discussed with her that she will follow-up in our office in December 2023. She verbalized understanding of our phone conversation and was appreciative of my call.   Finis Bud, NP

## 2022-08-15 ENCOUNTER — Ambulatory Visit: Payer: Medicare PPO | Admitting: Pulmonary Disease

## 2022-08-17 DIAGNOSIS — Z1159 Encounter for screening for other viral diseases: Secondary | ICD-10-CM | POA: Diagnosis not present

## 2022-08-21 ENCOUNTER — Other Ambulatory Visit: Payer: Self-pay | Admitting: Physical Medicine and Rehabilitation

## 2022-08-22 NOTE — Telephone Encounter (Signed)
I cannot refill until she has appointment on the books- if we can call her to do so, then I can send in refill- thanks ML

## 2022-08-27 ENCOUNTER — Encounter: Payer: Self-pay | Admitting: Family Medicine

## 2022-08-28 ENCOUNTER — Other Ambulatory Visit: Payer: Self-pay | Admitting: Family Medicine

## 2022-08-28 DIAGNOSIS — Z13820 Encounter for screening for osteoporosis: Secondary | ICD-10-CM

## 2022-08-29 ENCOUNTER — Telehealth: Payer: Self-pay

## 2022-08-29 NOTE — Telephone Encounter (Signed)
Left mssg informing pt that her novo nordisk shipment is ready for pickup.  4 boxes of [place] & 4 boxes of Fiasp are labeled and ready for pickup.

## 2022-08-30 ENCOUNTER — Telehealth: Payer: Self-pay | Admitting: Family Medicine

## 2022-08-30 DIAGNOSIS — J309 Allergic rhinitis, unspecified: Secondary | ICD-10-CM | POA: Diagnosis not present

## 2022-08-30 DIAGNOSIS — J341 Cyst and mucocele of nose and nasal sinus: Secondary | ICD-10-CM | POA: Diagnosis not present

## 2022-08-30 DIAGNOSIS — R0981 Nasal congestion: Secondary | ICD-10-CM | POA: Diagnosis not present

## 2022-08-30 NOTE — Telephone Encounter (Signed)
Clinical info completed on Statement of Disability form.  Placed form in Dr Arby Barrette box for completion.    When form is completed, please route note to "RN Team" and place in wall pocket in front office.   Ottis Stain, CMA

## 2022-08-30 NOTE — Telephone Encounter (Signed)
Patient dropped off form at front desk for Statement of Disability.  Verified that patient section of form has been completed.  Last DOS/WCC with PCP was 07/16/22.  Placed form in green team folder to be completed by clinical staff.  Cassandra Reid

## 2022-08-30 NOTE — Telephone Encounter (Signed)
Called patient regarding her request for forms.  Discussed purpose of forms and went over functional capacity questions with her.  I will complete these forms and placed in the RN triage box.   Ezequiel Essex, MD

## 2022-09-03 NOTE — Telephone Encounter (Signed)
Attempted to call patient to inform that paperwork is ready. She did not answer and VM is not set up.   Copy made and placed in batch scanning.   Will attempt to call patient later.   Talbot Grumbling, RN

## 2022-09-06 IMAGING — MR MR MRA HEAD W/O CM
1 series · 20 of 48 positions shown · non-contrast
Comparison: Prior MRA from 12/15/2018.

CLINICAL DATA: Initial evaluation for increased frequency of
headaches, history of aneurysm, status post clipping.

EXAM:
MRI HEAD WITHOUT CONTRAST
MRA HEAD WITHOUT CONTRAST
TECHNIQUE: Multiplanar, multiecho pulse sequences of the brain and surrounding
structures were obtained without intravenous contrast. Angiographic
images of the head were obtained using MRA technique without
contrast.

[Series 3: tof_3d_multi-slab · axial · 0.7mm · 0.35mm/px · z∈[-68,+26]mm · 20 of 143 slices shown]
[im 1/143]
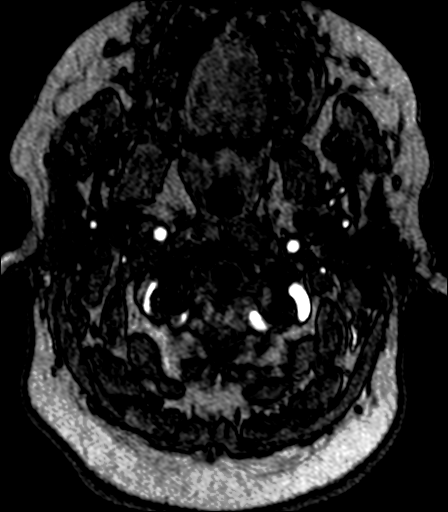
[im 4/143]
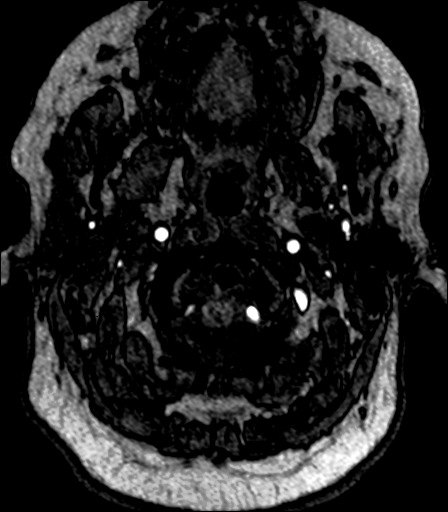
[im 7/143]
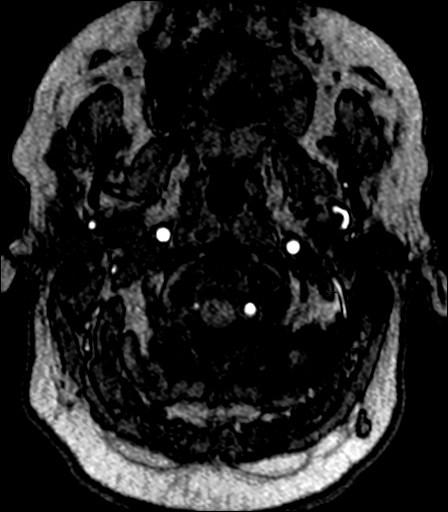
[im 10/143]
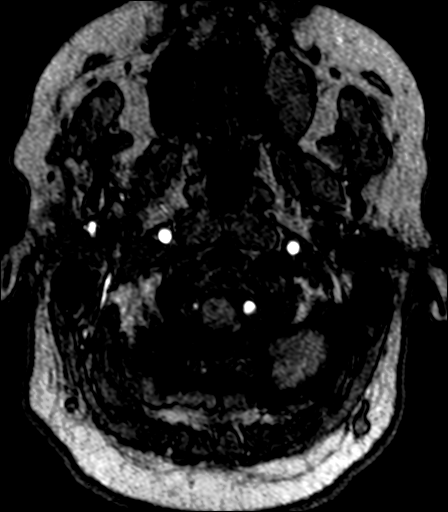
[im 13/143]
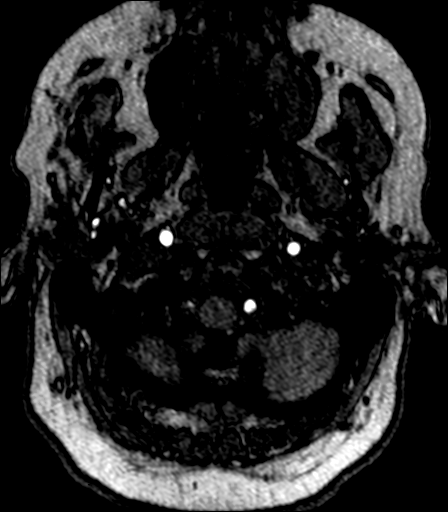
[im 16/143]
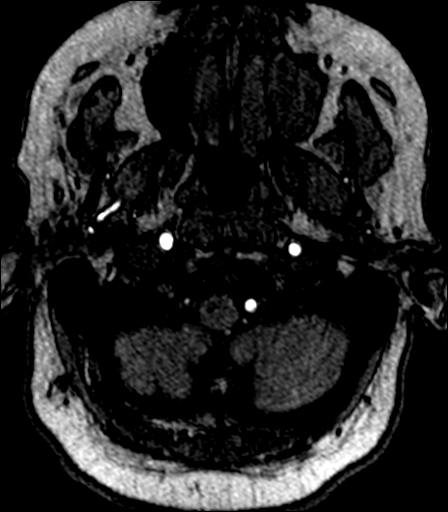
[im 19/143]
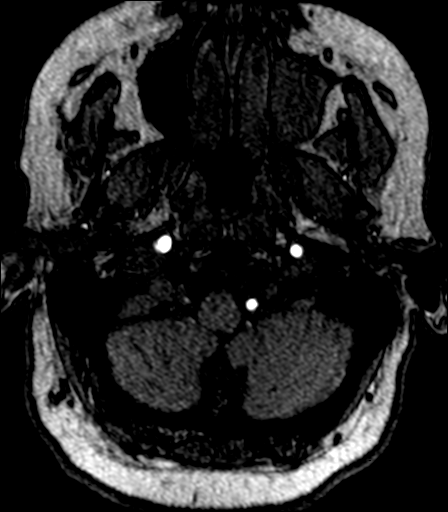
[im 22/143]
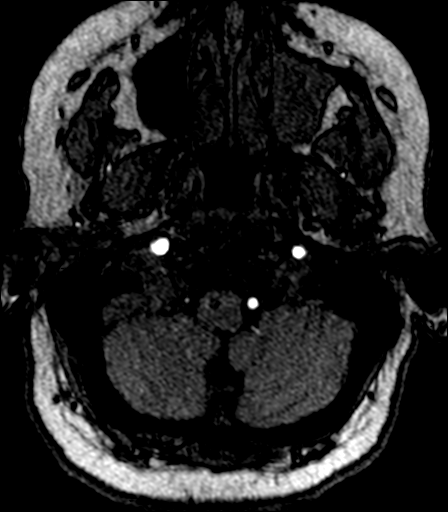
[im 25/143]
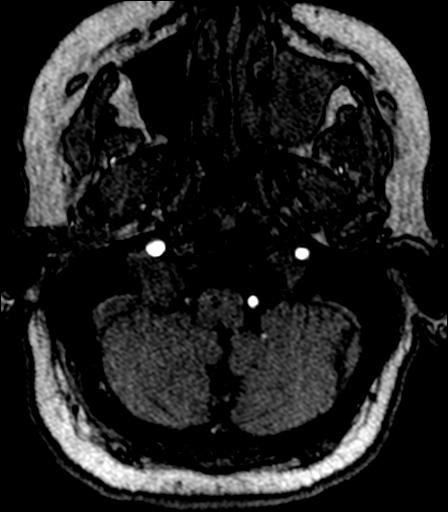
[im 28/143]
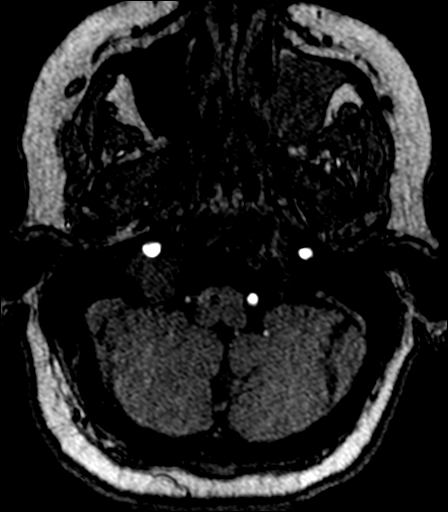
[im 31/143]
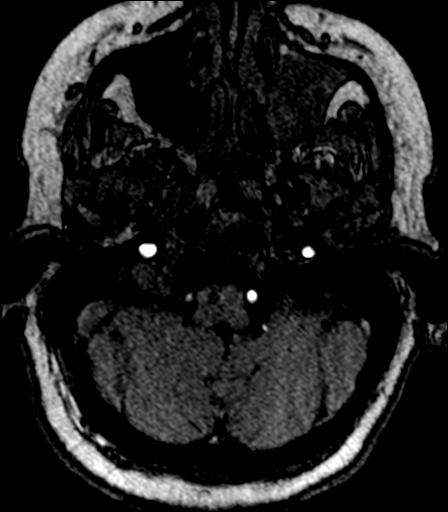
[im 34/143]
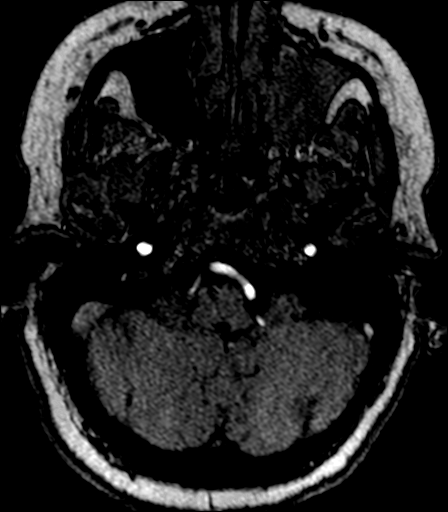
[im 46/143]
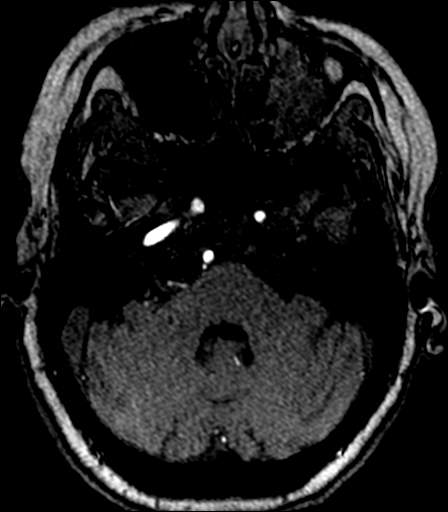
[im 64/143]
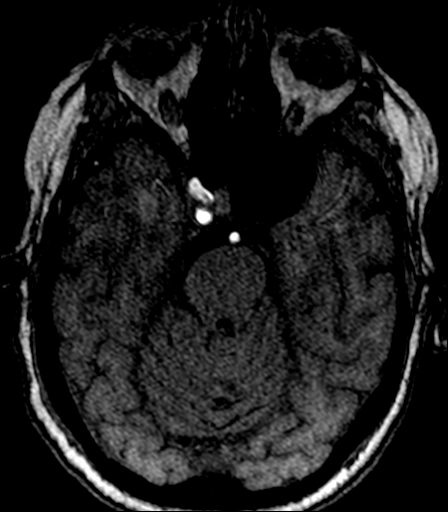
[im 73/143]
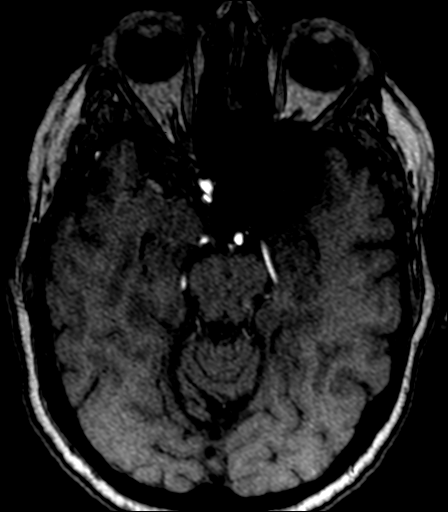
[im 82/143]
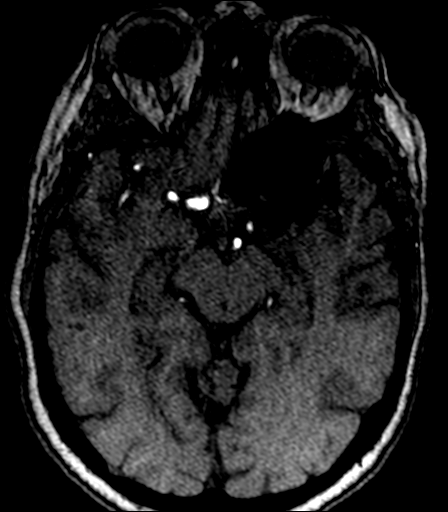
[im 100/143]
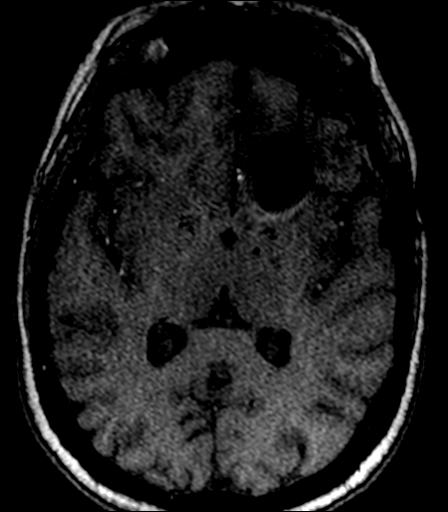
[im 118/143]
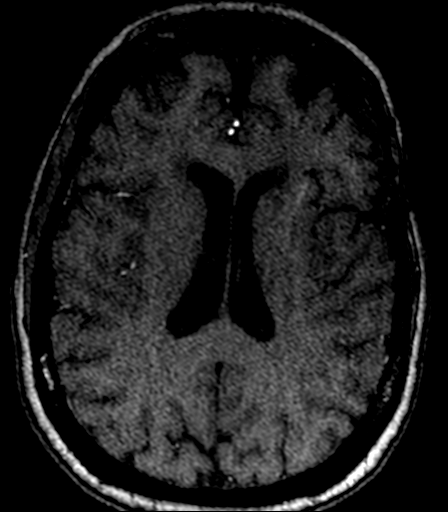
[im 121/143]
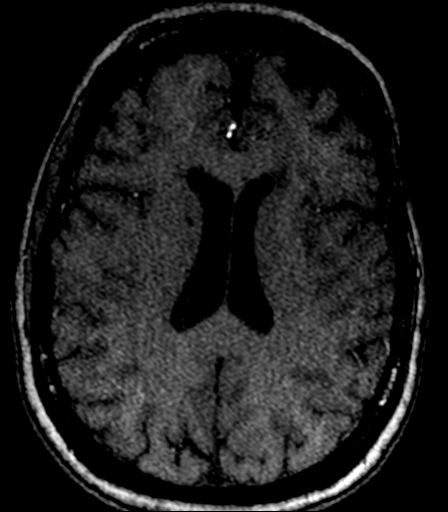
[im 136/143]
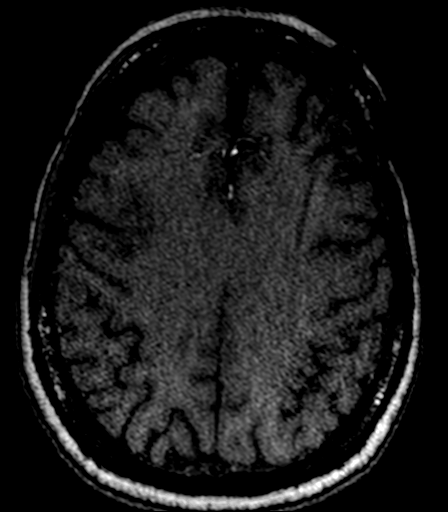

[20 of 48 positions shown; findings below may reference images not displayed]

FINDINGS: MRI HEAD FINDINGS

Brain: Surgical clip in place near the level of the left ICA
terminus with associated susceptibility artifact, mildly limiting
evaluation. Postoperative changes from overlying left pterional
craniotomy. Few scattered areas of encephalomalacia and gliosis
within the overlying left frontal lobe most likely postoperative in
nature.

Underlying cerebral volume within normal limits. Minimal T2/FLAIR
hyperintensity within the periventricular white matter, most likely
related chronic microvascular ischemic disease, felt to be within
normal limits for age. Few small remote lacunar infarcts noted at
the right caudate and left internal capsule.

No abnormal foci of restricted diffusion to suggest acute or
subacute ischemia. Gray-white matter differentiation otherwise
maintained. No other areas of encephalomalacia to suggest chronic
cortical infarction. No foci of susceptibility artifact to suggest
acute or chronic intracranial hemorrhage.

No mass lesion, mass effect, or midline shift. No hydrocephalus or
extra-axial fluid collection. Pituitary gland and suprasellar region
within normal limits. Midline structures intact and normal.

Vascular: Aneurysm clip in place near the level of the left ICA
terminus. Visualized intracranial vascular flow voids are
maintained.

Skull and upper cervical spine: Craniocervical junction within
normal limits. Bone marrow signal intensity normal. Post craniotomy
changes at the left frontotemporal calvarium. No other scalp soft
tissue abnormality.

Sinuses/Orbits: Globes and orbital soft tissues within normal
limits. Large retention cyst largely fills the left maxillary sinus.
Paranasal sinuses are otherwise clear. No mastoid effusion. Inner
ear structures grossly normal.

Other: None.

MRA HEAD FINDINGS

ANTERIOR CIRCULATION:

Visualized distal cervical segments of the internal carotid arteries
are widely patent with antegrade flow. Petrous segments patent
bilaterally. The left circle-of-Willis is obscured due to the
aneurysm clip. Cavernous and supraclinoid right ICA widely patent. 2
x 4 mm focal outpouching arising from the supraclinoid right ICA
again seen, unchanged from prior exam. Morphology is fairly conical,
favoring a infundibulum (series 3, image 74). Right A1 patent.
Visualized anterior communicating artery complex grossly normal.
Both ACAs appear widely patent to their distal aspects.

Left M1 segment and left MCA bifurcation are largely obscured.
Visualized left MCA branches well perfused.

Right M1 widely patent. Normal right MCA bifurcation. Distal right
MCA branches well perfused.

POSTERIOR CIRCULATION:

Dominant left vertebral artery widely patent to the vertebrobasilar
junction. Hypoplastic right vertebral artery patent as well. Left
PICA patent. Right PICA not seen. Basilar widely patent. Superior
cerebellar and posterior cerebral arteries widely patent
bilaterally.

Overall, appearance of the intracranial circulation is stable from
previous.
IMPRESSION: MRI HEAD IMPRESSION:

1. No acute intracranial abnormality.
2. Postoperative changes from previous left pterional craniotomy
with aneurysm clip in place at the level of the left ICA terminus.
Associated mild postoperative left frontal encephalomalacia and
gliosis.
3. Few small remote lacunar infarcts involving the right caudate and
left internal capsule.

MRA HEAD IMPRESSION:

Stable intracranial MRA. 2 x 4 mm focal outpouching arising from the
supraclinoid right ICA favored to reflect an infundibulum. No new
aneurysm.

## 2022-09-12 ENCOUNTER — Telehealth: Payer: Self-pay

## 2022-09-12 DIAGNOSIS — E1142 Type 2 diabetes mellitus with diabetic polyneuropathy: Secondary | ICD-10-CM | POA: Diagnosis not present

## 2022-09-12 NOTE — Telephone Encounter (Signed)
Left message informing patient that I was attempting to work on her re-enrollment with novo nordisk however patient will most likely need to schedule an appt to update medication info.

## 2022-09-13 ENCOUNTER — Telehealth: Payer: Self-pay | Admitting: Pharmacist

## 2022-09-13 MED ORDER — INSULIN DEGLUDEC 100 UNIT/ML ~~LOC~~ SOPN: 40.0000 [IU] | PEN_INJECTOR | Freq: Every day | SUBCUTANEOUS | Status: DC

## 2022-09-13 NOTE — Telephone Encounter (Signed)
Contacted patient to communicate that her medication assistance supply of Ozempic (semaglutide)  had arrived ( 4 pens of 2mg ).   Prior to calling I reviewed her CGM report (dexcom).   Date of review:  today % Time CGM is active: 99.8% Average Glucose: 163 mg/dL Glucose Management Indicator: 7.2  Glucose Variability: 21% (goal <36%) Time in Goal:  - Time in range 70-180: 69% - Time above range: 31% (all 180-250) - Time below range: 0% Observed patterns:  Good Control  However, patient reports she is administering glucose tablets due to low readings multiple times per week.   I verified insulin doses and then reduced Tresidba from 45 to 40 units once daily.  No change to Fiasp (insulin aspart) 15 units prior to each of 3 meals daily.   Patient verbalized change to treatment plan to avoid low reading/symptoms.   Follow-up next with Dr. Jeani Hawking - 11/3 (next week).

## 2022-09-19 NOTE — Telephone Encounter (Signed)
Submitted RE-ENROLLMENT application for OZEMPIC, FIASP, & TRESIBA to [place] for patient assistance.   Phone: [phone]

## 2022-09-21 ENCOUNTER — Ambulatory Visit (INDEPENDENT_AMBULATORY_CARE_PROVIDER_SITE_OTHER): Payer: Medicare PPO | Admitting: Family Medicine

## 2022-09-21 ENCOUNTER — Encounter: Payer: Self-pay | Admitting: Family Medicine

## 2022-09-21 VITALS — BP 128/76 | HR 78 | Ht 61.0 in | Wt 174.2 lb

## 2022-09-21 DIAGNOSIS — E1142 Type 2 diabetes mellitus with diabetic polyneuropathy: Secondary | ICD-10-CM | POA: Diagnosis not present

## 2022-09-21 DIAGNOSIS — Z794 Long term (current) use of insulin: Secondary | ICD-10-CM | POA: Diagnosis not present

## 2022-09-21 LAB — POCT GLYCOSYLATED HEMOGLOBIN (HGB A1C): HbA1c, POC (controlled diabetic range): 6.8 % (ref 0.0–7.0)

## 2022-09-21 NOTE — Patient Instructions (Addendum)
It was wonderful to see you today. Thank you for allowing me to be a part of your care. Below is a short summary of what we discussed at your visit today:  Diabetes A1c today was 6.8. THIS IS AMAZING!  THIS IS THE LOWEST IT HAS BEEN IN YEARS. WooHoo!  Reduce your mealtime insulin (Fiasp) at dinnertime from 15 units to 12 units at dinner. Keep doing the mealtime insulin Fiasp 15 units before your breakfast and lunch.  Keep your daily insulin Tresiba at 40 units a day.  Follow-up with either myself or Dr. Valentina Lucks in 2 weeks.    If you have any questions or concerns, please do not hesitate to contact us via phone or MyChart message.   Ezequiel Essex, MD

## 2022-09-21 NOTE — Progress Notes (Unsigned)
    SUBJECTIVE:   CHIEF COMPLAINT / HPI:   Type 2 diabetes Current regimen includes Farxiga 10 mg, atorvastatin 40 mg, insulin aspart (Fiasp), insulin degludec Tyler Aas), Ozempic 2 mg, lisinopril 40 mg.   Last evaluation via phone with Dr. Valentina Lucks 10/26.  CGM indicated time in range 69%, out of range 31% (glucose 180-250).  No lows noted on CGM.  Dr. Valentina Lucks reduced Tyler Aas daily insulin from 45 to 40 units daily.  No changes were made to the [facility] mealtime insulin, still 15 units daily.  Today Ms. Folden reports she is feeling fine with her reduced insulin dose, although has only been handful of days.  She reports getting low notifications about once per week.  She always double checks with a fingerstick glucose and finds the FSBS usually to be about 10 points higher than the CGM.  The lowest hypoglycemia episodes she has corrected recently was in the 3s.  These lows usually happen in the evenings between 10 PM and midnight.  She is taking 15 units of mealtime insulin before each of her 3 meals in the day.  She is also having a snack around 9 PM.  She reports her morning sugars are her main issue.  Around 5 or 6 AM her sugar will be around 170 and if she does not take her insulin by 8 AM it can range up to 200.  Lab Results  Component Value Date   HGBA1C 7.6 (H) 05/21/2022   HGBA1C 7.7 (A) 12/27/2021   HGBA1C 8.9 (A) 07/28/2021   Lab Results  Component Value Date   MICROALBUR 150 11/10/2020   Elias-Fela Solis 92 06/26/2022   CREATININE 1.05 (H) 08/03/2022   PERTINENT  PMH / PSH: T2DM, diabetic polyneuropathy, HLD, CKD 3, cerebral aneurysm without rupture, MDD, GAD, history of lacunar infarction, osteoporosis, fatty liver, degenerative disc disease, fibromyalgia, myofascial pain dysfunction syndrome  OBJECTIVE:   BP 128/76   Pulse 78   Ht 5\' 1"  (1.549 m)   Wt 174 lb 3.2 oz (79 kg)   SpO2 95%   BMI 32.91 kg/m    General: Awake, alert, somewhat tired appearing but overall no acute  distress Cardiac: Regular rate and rhythm, no murmurs, brisk cap refill, normal skin turgor Respiratory: CTAB  ASSESSMENT/PLAN:   Type 2 diabetes mellitus with diabetic polyneuropathy, with long-term current use of insulin Doing exceptionally well with her current regimen, A1c today 6.8 down significantly from 7.6 in July.  If we are able to get her A1c stable at goal, I would love to get her off injection insulin and onto an oral medication regimen.  Plan for today is to reduce dinnertime insulin to address nighttime lows.  Both Ozempic and Wilder Glade are currently at max dose.  Could consider switching Ozempic for [facility] or reinitiating metformin. - Continue Ozempic 2 mg weekly, Farxiga 10 mg, atorvastatin 40 mg, lisinopril 40 mg - Continue Tresiba daily 40 units - Fiasp mealtime insulin: 15 units breakfast, 15 units lunch, 12 units dinner - Follow-up with either myself or Dr. Everitt Amber in about 2 weeks for diabetes management - Next A1c in 3 months    Ezequiel Essex, MD [place]

## 2022-09-22 LAB — MICROALBUMIN / CREATININE URINE RATIO
Creatinine, Urine: 29.1 mg/dL
Microalb/Creat Ratio: <10 mg/g creat (ref 0–29)
Microalbumin, Urine: <3 ug/mL

## 2022-09-22 MED ORDER — INSULIN ASPART (W/NIACINAMIDE) 100 UNIT/ML ~~LOC~~ SOPN: PEN_INJECTOR | SUBCUTANEOUS | 0 refills | Status: DC

## 2022-09-22 NOTE — Assessment & Plan Note (Signed)
Doing exceptionally well with her current regimen, A1c today 6.8 down significantly from 7.6 in July.  If we are able to get her A1c stable at goal, I would love to get her off injection insulin and onto an oral medication regimen.  Plan for today is to reduce dinnertime insulin to address nighttime lows.  Both Ozempic and Wilder Glade are currently at max dose.  Could consider switching Ozempic for [facility] or reinitiating metformin. - Continue Ozempic 2 mg weekly, Farxiga 10 mg, atorvastatin 40 mg, lisinopril 40 mg - Continue Tresiba daily 40 units - Fiasp mealtime insulin: 15 units breakfast, 15 units lunch, 12 units dinner - Follow-up with either myself or Dr. Everitt Amber in about 2 weeks for diabetes management - Next A1c in 3 months

## 2022-09-25 DIAGNOSIS — R519 Headache, unspecified: Secondary | ICD-10-CM | POA: Diagnosis not present

## 2022-09-25 DIAGNOSIS — J341 Cyst and mucocele of nose and nasal sinus: Secondary | ICD-10-CM | POA: Diagnosis not present

## 2022-10-02 NOTE — Telephone Encounter (Signed)
Mailed [employer]me re-enrollment application to pt's home.

## 2022-10-04 ENCOUNTER — Ambulatory Visit
Admission: RE | Admit: 2022-10-04 | Discharge: 2022-10-04 | Disposition: A | Discharge status: Home / Self Care | Payer: Medicare PPO | Source: Ambulatory Visit | Attending: Family Medicine | Admitting: Family Medicine

## 2022-10-04 DIAGNOSIS — M85852 Other specified disorders of bone density and structure, left thigh: Secondary | ICD-10-CM | POA: Diagnosis not present

## 2022-10-04 DIAGNOSIS — Z13820 Encounter for screening for osteoporosis: Secondary | ICD-10-CM

## 2022-10-04 DIAGNOSIS — Z780 Asymptomatic menopausal state: Secondary | ICD-10-CM | POA: Diagnosis not present

## 2022-10-05 ENCOUNTER — Encounter: Payer: Self-pay | Admitting: Family Medicine

## 2022-10-08 ENCOUNTER — Other Ambulatory Visit: Payer: Self-pay | Admitting: Otolaryngology

## 2022-10-13 ENCOUNTER — Other Ambulatory Visit: Payer: Self-pay | Admitting: Family Medicine

## 2022-10-13 DIAGNOSIS — F32A Depression, unspecified: Secondary | ICD-10-CM

## 2022-10-15 NOTE — Progress Notes (Deleted)
Cardiology Clinic Note   Patient Name: Cassandra Reid Date of Encounter: 10/15/2022  Primary Care Provider:  Ezequiel Essex, MD Primary Cardiologist:  Kirk Ruths, MD  Patient Profile     Cassandra Reid 66 year old female presents to the clinic today for follow-up evaluation of her paroxysmal atrial tachycardia.  Past Medical History    Past Medical History:  Diagnosis Date   Allergic rhinitis 09/26/2018   Anosmia    Arthritis    Asthma    Balance problems 09/22/2021   C. difficile colitis 07/18/2018   Cerebral aneurysm without rupture    Chest pain 03/26/2020   Chronic pain syndrome 04/23/2019   CKD stage 3 due to type 2 diabetes mellitus 04/15/2019   DDD (degenerative disc disease), cervical 04/16/2019   Encephalomalacia on imaging study 08/25/2020   Brain MRI 08/25/21: Few scattered areas of encephalomalacia and gliosis within the overlying left frontal lobe most likely postoperative in nature.   Fatty liver 05/31/2020   Fibromyalgia    Former smoker    Generalized anxiety disorder    GERD (gastroesophageal reflux disease)    Heart murmur    History of chicken pox    History of colon polyps    Hyperlipidemia associated with type 2 diabetes mellitus 07/18/2018   Hypertension associated with diabetes 07/18/2018   IBS (irritable bowel syndrome)    Incontinence 09/22/2021   Bowel and bladder.    Ischemic brain injury 05/29/2021   Lacunar infarction 05/25/2021   Brain MRI 08/25/21: Few small remote lacunar infarcts noted at the right caudate and left internal capsule   Legally blind    Major depressive disorder    Migraines    Myalgia 04/23/2019   OSA (obstructive sleep apnea) 07/18/2018   not using CPAP machine (broken)   Osteoporosis 01/18/2021   Paroxysmal atrial tachycardia    On telemetry   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   Type 2 diabetes mellitus with  diabetic polyneuropathy, with long-term current use of insulin 07/18/2018   Past Surgical History:  Procedure Laterality Date   ABDOMINAL HYSTERECTOMY  2012   APPENDECTOMY  1972   CEREBRAL ANEURYSM REPAIR Left 2004   Aneurysm clip in place near the level of the left ICA on Brain MRI 08/2020. Also coiling performed   CHOLECYSTECTOMY  2015   CRANIOTOMY Left    left pterional craniotomy   MENISCUS REPAIR Bilateral     Allergies  No Known Allergies  History of Present Illness    Cassandra Reid is a PMH of hypertension, type 2 diabetes, CKD stage III, hyperlipidemia, paroxysmal atrial tachycardia, cerebral aneurysm without rupture, OSA, GERD, urinary infarct, blindness, obesity, fibromyalgia, balance problems, anxiety, and major depressive disorder.  She was admitted with chest pain on 5/21.  Her troponins at that time were normal.  Her echocardiogram showed normal LV function, G1 DD, mild left atrial enlargement, and trace mitral regurgitation.  A cardiac event monitor 6/21 showed sinus rhythm with PACs, PVCs, and brief atrial tachycardia.  She had a coronary CTA 5/21 which showed calcium score of 0 and no CAD with myocardial bridging mid-distal LAD.  She was seen in follow-up by Dr. Stanford Breed on 7/22.  She reported occasional dyspnea on exertion but denied PND orthopnea and lower extremity swelling.  She reported continued chronic occasional chest discomfort in her left axillary region which was unchanged from prior.  Her EKG showed normal  sinus rhythm with no acute changes.  No further ischemic evaluation was pursued.  Follow-up echocardiogram showed no evidence of mitral valve prolapse.  Her blood pressure at that time was 148/80.  Her lisinopril was increased to 20 mg daily.  Follow-up was planned for 12 months.  She was seen in follow-up by Finis Bud, NP-C on 06/22/2022.  She noted dyspnea on exertion, palpitations, orthostatic dizziness.  Her symptoms have been present for past couple  months.  She noted shortness of breath with exertion while walking and reported palpitations occasionally while she would stand up and with exertion.  Her Apple Watch showed a maximum heart rate which was elevated to 120.  She continued to have chronic chest pain with a pinching sensation in her left axillary region.  This discomfort has been present for years.  She attributed to spinal disc problems and myofascial pain dysfunction syndrome.  She also noted some aching in her legs which was occasional.  She noted some pain with walking however, she did note relief from her symptoms with leg elevation and heat application.  She was taking Lasix as needed.  She was tolerating medications well.  Her blood pressure at home is well-controlled.  She did note episodes of dizziness with changing positions but denied syncope, presyncope, lower extremity swelling, and weight changes.  She she was compliant with CPAP therapy.  08/03/2022 BMP was stable and follow-up echocardiogram 08/03/2022 showed an EF of 60 to 65%, G1 DD, and no significant valvular abnormalities.  She presents to the clinic today for follow-up evaluation and states***  *** denies chest pain, shortness of breath, lower extremity edema, fatigue, palpitations, melena, hematuria, hemoptysis, diaphoresis, weakness, presyncope, syncope, orthopnea, and PND.  DOE-underwent echocardiogram 08/03/2022 which showed normal LVEF and G1 DD with no significant valvular abnormalities.  Details above.  Lab work at that time was unremarkable.  Appears to be related to body habitus versus deconditioning. Increase physical activity as tolerated Heart healthy low-sodium diet No plans for further cardiac evaluation at this time  Palpitations-denies recent episodes.  Continue to monitor with Apple Watch. Avoid triggers caffeine, chocolate, EtOH, dehydration etc.  Cardiac event monitor 06/22/2022 showed sinus bradycardiac, sinus rhythm, sinus tachycardia PACs, brief runs  of PAT, PVCs and a 3 beat run of NSVT. Increase physical activity as tolerated  Essential hypertension-BP today***.  Previously had amlodipine decreased to 5 mg daily and lisinopril increased from 10 mg to 20 mg. Continue current medical therapy Heart healthy low-sodium diet Increase physical activity as tolerated  Orthostatic dizziness-does note some improvement with changing positions slowly and medication adjustment. Recommended lower extremity support stockings  Stage III CKD-stable. Follows with PCP  Disposition: Follow-up with Dr. Stanford Breed in 6-9 months.   Disposition: Follow-up with Dr. Stanford Breed in 12 months.  Home Medications    Prior to Admission medications   Medication Sig Start Date End Date Taking? Authorizing Provider  amLODipine (NORVASC) 5 MG tablet Take 1 tablet (5 mg total) by mouth daily. Patient not taking: Reported on 07/16/2022 06/22/22   Finis Bud, NP  Ascorbic Acid (VITAMIN C ADULT GUMMIES PO) Take 1 each by mouth daily.    [provider]  atorvastatin (LIPITOR) 40 MG tablet TAKE 1 TABLET BY MOUTH EVERYDAY AT BEDTIME 06/19/22   Ezequiel Essex, MD  baclofen (LIORESAL) 10 MG tablet TAKE 1 TABLET BY MOUTH TWICE A DAY AS NEEDED FOR MUSCLE SPASM 12/26/21   Ezequiel Essex, MD  BD PEN NEEDLE NANO 2ND GEN 32G X 4 MM  [place] USE WITH INSULIN 02/21/21   Ezequiel Essex, MD  blood glucose meter kit and supplies Dispense based on patient and insurance preference. Use up to four times daily as directed. (FOR ICD-10 E10.9, E11.9). 03/09/22   Ezequiel Essex, MD  Continuous Blood Gluc Receiver ([place]) DEVI Please use as directed to check blood sugar. 01/17/22   Ezequiel Essex, MD  Continuous Blood Gluc Sensor (DEXCOM G6 SENSOR) MISC Use as directed to check blood sugar. 01/17/22   Ezequiel Essex, MD  Continuous Blood Gluc Transmit (DEXCOM G6 TRANSMITTER) MISC Use as directed to check blood sugar. 01/17/22   Ezequiel Essex, MD  dapagliflozin propanediol  (FARXIGA) 10 MG TABS tablet Take 1 tablet (10 mg total) by mouth daily before breakfast. This is a patient assistance medication. Patient may not be approved and/or have medication. Please ask and verify when performing med review. 09/27/21   [name], MD  dicyclomine (BENTYL) 20 MG tablet Take 1 tablet (20 mg total) by mouth 2 (two) times daily as needed for spasms. 10/15/21   [name], PA-C  DULoxetine (CYMBALTA) 60 MG capsule Take 1 capsule (60 mg total) by mouth daily. 04/30/22   Lovorn, [name], MD  ezetimibe (ZETIA) 10 MG tablet Take 1 tablet (10 mg total) by mouth daily. 07/16/22   Ezequiel Essex, MD  famotidine (PEPCID) 20 MG tablet Take 1 tablet (20 mg total) by mouth daily. 06/05/22   Ezequiel Essex, MD  fluticasone [facility]) 50 MCG/ACT nasal spray Place 1 spray into both nostrils daily as needed for allergies or rhinitis. 08/09/21   [name], DO  furosemide (LASIX) 40 MG tablet Take 1 tablet (40 mg total) by mouth as needed for edema. 06/22/22   Finis Bud, NP  gabapentin (NEURONTIN) 600 MG tablet TAKE 0.5-1 TABLET (300-600 MG TOTAL) BY MOUTH 3 (THREE) TIMES DAILY. 07/24/22   Ezequiel Essex, MD  glucose blood test strip Use as instructed 03/09/22   Ezequiel Essex, MD  insulin aspart (FIASP) 100 UNIT/ML FlexTouch Pen Inject 15 Units into the skin daily with breakfast AND 15 Units daily with lunch AND 12 Units daily with supper. This is a patient assistance medication. Patient may not be approved and/or have medication. Please ask and verify when performing med review.. 09/22/22   Ezequiel Essex, MD  insulin degludec (TRESIBA) 100 UNIT/ML FlexTouch Pen Inject 40 Units into the skin daily. This is a patient assistance medication. Patient may not be approved and/or have medication. Please ask and verify when performing med review. 09/13/22   Leavy Cella, RPH-CPP  lisinopril (ZESTRIL) 40 MG tablet Take 1 tablet (40 mg total) by mouth daily. 07/13/22   Finis Bud, NP   loratadine (CLARITIN) 10 MG tablet TAKE 1 TABLET EVERY DAY 01/18/22   Ezequiel Essex, MD  LORazepam (ATIVAN) 0.5 MG tablet Take 1 tablet (0.5 mg total) by mouth every 8 (eight) hours as needed for anxiety. Start 03/22/2022 07/24/22   Ezequiel Essex, MD  magnesium oxide (MAG-OX) 400 (241.3 Mg) MG tablet Take 1 tablet (400 mg total) by mouth 2 (two) times daily. 03/27/20   Regalado, [name], MD  metFORMIN (GLUCOPHAGE-XR) 750 MG 24 hr tablet TAKE 1 TABLET (750 MG TOTAL) BY MOUTH 2 (TWO) TIMES DAILY WITH A MEAL. 02/07/22   Ezequiel Essex, MD  Multiple Vitamin (MULTIVITAMIN PO) Take 1 tablet by mouth daily.    [provider]  ondansetron (ZOFRAN) 4 MG tablet Take 1 tablet (4 mg total) by mouth every 6 (six) hours.  10/15/21   [name], PA-C  OneTouch Delica Lancets 38V MISC Please use to check blood sugar up to 4 times daily. E11.42 03/09/22   Ezequiel Essex, MD  pantoprazole (PROTONIX) 40 MG tablet Take 1 tablet (40 mg total) by mouth daily. 07/24/22   Ezequiel Essex, MD  potassium chloride SA (KLOR-CON M20) 20 MEQ tablet Take 1 tablet (20 mEq total) by mouth as needed (TAKE WITH LASIX PRN EDEMA). 06/22/22   Finis Bud, NP  Semaglutide, 2 MG/DOSE, (OZEMPIC, 2 MG/DOSE,) 8 MG/3ML SOPN Inject 2 mg into the skin once a week. On Friday 02/12/22   [name], MD  sucralfate (CARAFATE) 1 g tablet TAKE 1 TABLET FOUR TIMES DAILY, WITH MEALS AND AT BEDTIME (OKAY TO TRIAL OFF AS INSTRUCTED) 03/13/22   Ezequiel Essex, MD  traZODone (DESYREL) 50 MG tablet TAKE 1/2 TABLET BY MOUTH AT BEDTIME AS NEEDED FOR SLEEP. 01/17/22   Ezequiel Essex, MD    Family History    Family History  Problem Relation Age of Onset   Alcohol abuse Mother    Diabetes Mother    Hypertension Mother    Kidney disease Mother    COPD Father    Diabetes Father    Early death Father    Heart disease Father    Hyperlipidemia Father    Hypertension Father    Diabetes Sister    Hypertension Sister    Breast cancer  Sister    Diabetes Sister    Hypertension Sister    Kidney disease Sister    Cancer Maternal Grandmother    Alcohol abuse Maternal Grandfather    Cancer Maternal Grandfather    Cancer Paternal Grandmother    Cancer Paternal Grandfather    She indicated that her mother is deceased. She indicated that her father is deceased. She indicated that only one of her two sisters is alive. She indicated that her maternal grandmother is deceased. She indicated that her maternal grandfather is deceased. She indicated that her paternal grandmother is deceased. She indicated that her paternal grandfather is deceased.  Social History    Social History   Socioeconomic History   Marital status: Divorced    Spouse name: John   Number of children: 3   Years of education: 14   Highest education level: Associate degree: academic program  Occupational History   Occupation: disabled  Tobacco Use   Smoking status: Former    Packs/day: 1.00    Years: 3.00    Total pack years: 3.00    Types: Cigarettes    Quit date: 06/1967    Years since quitting: 55.3    Passive exposure: Past   Smokeless tobacco: Never  Vaping Use   Vaping Use: Never used  Substance and Sexual Activity   Alcohol use: Never   Drug use: Not Currently   Sexual activity: Not Currently  Other Topics Concern   Not on file  Social History Narrative   Patient lives in [place].    Patient walks her dog daily for exercise.    Patient is separated from her husband.   Patient does not drive. Uses home delivery services and [employer].    Right handed   One story apartment   Caffeine on daily   Social Determinants of Health   Financial Resource Strain: Low Risk  (03/19/2022)   Overall Financial Resource Strain (CARDIA)    Difficulty of Paying Living Expenses: Not hard at all  Food Insecurity: No Food Insecurity (03/19/2022)   Hunger  Vital Sign    Worried About Charity fundraiser in the Last Year: Never true    Ran Out of Food in the  Last Year: Never true  Transportation Needs: No Transportation Needs (03/19/2022)   PRAPARE - Hydrologist (Medical): No    Lack of Transportation (Non-Medical): No  Physical Activity: Sufficiently Active (03/19/2022)   Exercise Vital Sign    Days of Exercise per Week: 7 days    Minutes of Exercise per Session: 30 min  Stress: Stress Concern Present (03/19/2022)   [place]    Feeling of Stress : Very much  Social Connections: Socially Isolated (03/19/2022)   Social Connection and Isolation Panel [NHANES]    Frequency of Communication with Friends and Family: Once a week    Frequency of Social Gatherings with Friends and Family: Once a week    Attends Religious Services: Never    Marine scientist or Organizations: No    Attends Archivist Meetings: Never    Marital Status: Separated  Intimate Partner Violence: Not At Risk (03/19/2022)   Humiliation, Afraid, Rape, and Kick questionnaire    Fear of Current or Ex-Partner: No    Emotionally Abused: No    Physically Abused: No    Sexually Abused: No     Review of Systems    General:  No chills, fever, night sweats or weight changes.  Cardiovascular:  No chest pain, dyspnea on exertion, edema, orthopnea, palpitations, paroxysmal nocturnal dyspnea. Dermatological: No rash, lesions/masses Respiratory: No cough, dyspnea Urologic: No hematuria, dysuria Abdominal:   No nausea, vomiting, diarrhea, bright red blood per rectum, melena, or hematemesis Neurologic:  No visual changes, wkns, changes in mental status. All other systems reviewed and are otherwise negative except as noted above.  Physical Exam    VS:  There were no vitals taken for this visit. , BMI There is no height or weight on file to calculate BMI. GEN: Well nourished, well developed, in no acute distress. HEENT: normal. Neck: Supple, no JVD, carotid bruits, or  masses. Cardiac: RRR, no murmurs, rubs, or gallops. No clubbing, cyanosis, edema.  Radials/DP/PT 2+ and equal bilaterally.  Respiratory:  Respirations regular and unlabored, clear to auscultation bilaterally. GI: Soft, nontender, nondistended, BS + x 4. MS: no deformity or atrophy. Skin: warm and dry, no rash. Neuro:  Strength and sensation are intact. Psych: Normal affect.  Accessory Clinical Findings    Recent Labs: 05/21/2022: ALT 57 06/26/2022: Hemoglobin 15.0; Magnesium 2.1; Platelets 167; TSH 1.500 08/03/2022: BUN 20; Creatinine, Ser 1.05; Potassium 4.2; Sodium 141   Recent Lipid Panel    Component Value Date/Time   CHOL 184 06/26/2022 0824   TRIG 266 (H) 06/26/2022 0824   HDL 48 06/26/2022 0824   CHOLHDL 3.8 06/26/2022 0824   CHOLHDL 2.9 07/31/2019 1339   VLDL 38.2 07/18/2018 0940   LDLCALC 92 06/26/2022 0824   LDLCALC 48 07/31/2019 1339    No BP recorded.  {Refresh Note OR Click here to enter BP  :1}***    ECG personally reviewed by me today- *** - No acute changes  Cardiac event monitor 8/23 Patch Wear Time:  10 days and 0 hours (2023-08-09T16:38:26-0400 to 2023-08-19T16:58:30-0400)   Patient had a min HR of 55 bpm, max HR of 210 bpm, and avg HR of 75 bpm. Predominant underlying rhythm was Sinus Rhythm. 17 Supraventricular Tachycardia runs occurred, the run with the fastest interval lasting  5 beats with a max rate of 210 bpm, the  longest lasting 10.0 secs with an avg rate of 127 bpm. Some episode of Supraventricular Tachycardia may be possible Atrial Tachycardia with variable block. Supraventricular Tachycardia was detected within +/- 45 seconds of symptomatic patient event(s).  Isolated SVEs were rare (<1.0%), SVE Couplets were rare (<1.0%), and SVE Triplets were rare (<1.0%). Isolated VEs were rare (<1.0%, 835), VE Couplets were rare (<1.0%, 2), and VE Triplets were rare (<1.0%, 1).     Sinus bradycardia, NSR, sinus tachycardia; PACs, brief runs of PAT, PVCs and 3  beats NSVT Kirk Ruths  Echocardiogram 08/03/2022  IMPRESSIONS     1. Left ventricular ejection fraction, by estimation, is 60 to 65%. The  left ventricle has normal function. The left ventricle has no regional  wall motion abnormalities. Left ventricular diastolic parameters are  consistent with Grade I diastolic  dysfunction (impaired relaxation). The average left ventricular global  longitudinal strain is -20.5 %. The global longitudinal strain is normal.   2. Right ventricular systolic function is normal. The right ventricular  size is normal.   3. The mitral valve is normal in structure. No evidence of mitral valve  regurgitation. No evidence of mitral stenosis.   4. The aortic valve is normal in structure. Aortic valve regurgitation is  not visualized. No aortic stenosis is present.   5. The inferior vena cava is normal in size with greater than 50%  respiratory variability, suggesting right atrial pressure of 3 mmHg.   Comparison(s): No significant change from prior study. Prior images  reviewed side by side. 03/26/20 EF 65-70%.   FINDINGS   Left Ventricle: Left ventricular ejection fraction, by estimation, is 60  to 65%. The left ventricle has normal function. The left ventricle has no  regional wall motion abnormalities. The average left ventricular global  longitudinal strain is -20.5 %.  The global longitudinal strain is normal. The left ventricular internal  cavity size was normal in size. There is no left ventricular hypertrophy.  Left ventricular diastolic parameters are consistent with Grade I  diastolic dysfunction (impaired  relaxation). Normal left ventricular filling pressure.   Right Ventricle: The right ventricular size is normal. No increase in  right ventricular wall thickness. Right ventricular systolic function is  normal.   Left Atrium: Left atrial size was normal in size.   Right Atrium: Right atrial size was normal in size.   Pericardium: There  is no evidence of pericardial effusion.   Mitral Valve: The mitral valve is normal in structure. No evidence of  mitral valve regurgitation. No evidence of mitral valve stenosis.   Tricuspid Valve: The tricuspid valve is normal in structure. Tricuspid  valve regurgitation is not demonstrated. No evidence of tricuspid  stenosis.   Aortic Valve: The aortic valve is normal in structure. Aortic valve  regurgitation is not visualized. No aortic stenosis is present.   Pulmonic Valve: The pulmonic valve was normal in structure. Pulmonic valve  regurgitation is not visualized. No evidence of pulmonic stenosis.   Aorta: The aortic root is normal in size and structure.   Venous: The inferior vena cava is normal in size with greater than 50%  respiratory variability, suggesting right atrial pressure of 3 mmHg.   IAS/Shunts: No atrial level shunt detected by color flow Doppler.   Assessment & Plan   1.  ***   Jossie Ng. Remi Rester NP-C     10/15/2022, [place]  [address] [phone] Fax [phone]    I spent***minutes examining this patient, reviewing medications, and using patient centered shared decision making involving her cardiac care.  Prior to her visit I spent greater than 20 minutes reviewing her past medical history,  medications, and prior cardiac tests.

## 2022-10-22 ENCOUNTER — Ambulatory Visit: Payer: Medicare PPO | Admitting: General Practice

## 2022-10-31 NOTE — Progress Notes (Deleted)
Cardiology Clinic Note   Patient Name: Cassandra Reid Date of Encounter: 10/31/2022  Primary Care Provider:  Ezequiel Essex, MD Primary Cardiologist:  Kirk Ruths, MD  Patient Profile     Cassandra Reid 66 year old female presents to the clinic today for follow-up evaluation of her paroxysmal atrial tachycardia.  Past Medical History    Past Medical History:  Diagnosis Date   Allergic rhinitis 09/26/2018   Anosmia    Arthritis    Asthma    Balance problems 09/22/2021   C. difficile colitis 07/18/2018   Cerebral aneurysm without rupture    Chest pain 03/26/2020   Chronic pain syndrome 04/23/2019   CKD stage 3 due to type 2 diabetes mellitus 04/15/2019   DDD (degenerative disc disease), cervical 04/16/2019   Encephalomalacia on imaging study 08/25/2020   Brain MRI 08/25/21: Few scattered areas of encephalomalacia and gliosis within the overlying left frontal lobe most likely postoperative in nature.   Fatty liver 05/31/2020   Fibromyalgia    Former smoker    Generalized anxiety disorder    GERD (gastroesophageal reflux disease)    Heart murmur    History of chicken pox    History of colon polyps    Hyperlipidemia associated with type 2 diabetes mellitus 07/18/2018   Hypertension associated with diabetes 07/18/2018   IBS (irritable bowel syndrome)    Incontinence 09/22/2021   Bowel and bladder.    Ischemic brain injury 05/29/2021   Lacunar infarction 05/25/2021   Brain MRI 08/25/21: Few small remote lacunar infarcts noted at the right caudate and left internal capsule   Legally blind    Major depressive disorder    Migraines    Myalgia 04/23/2019   OSA (obstructive sleep apnea) 07/18/2018   not using CPAP machine (broken)   Osteoporosis 01/18/2021   Paroxysmal atrial tachycardia    On telemetry   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   Type 2 diabetes mellitus with  diabetic polyneuropathy, with long-term current use of insulin 07/18/2018   Past Surgical History:  Procedure Laterality Date   ABDOMINAL HYSTERECTOMY  2012   APPENDECTOMY  1972   CEREBRAL ANEURYSM REPAIR Left 2004   Aneurysm clip in place near the level of the left ICA on Brain MRI 08/2020. Also coiling performed   CHOLECYSTECTOMY  2015   CRANIOTOMY Left    left pterional craniotomy   MENISCUS REPAIR Bilateral     Allergies  No Known Allergies  History of Present Illness    ELSE HABERMANN has a PMH of hypertension, type 2 diabetes, CKD stage III, hyperlipidemia, paroxysmal atrial tachycardia, cerebral aneurysm without rupture, OSA, GERD, urinary infarct, blindness, obesity, fibromyalgia, balance problems, anxiety, and major depressive disorder.  She was admitted with chest pain on 5/21.  Her troponins at that time were normal.  Her echocardiogram showed normal LV function, G1 DD, mild left atrial enlargement, and trace mitral regurgitation.  A cardiac event monitor 6/21 showed sinus rhythm with PACs, PVCs, and brief atrial tachycardia.  She had a coronary CTA 5/21 which showed calcium score of 0 and no CAD with myocardial bridging mid-distal LAD.  She was seen in follow-up by Dr. Stanford Breed on 7/22.  She reported occasional dyspnea on exertion but denied PND orthopnea and lower extremity swelling.  She reported continued chronic occasional chest discomfort in her left axillary region which was unchanged from prior.  Her EKG showed normal  sinus rhythm with no acute changes.  No further ischemic evaluation was pursued.  Follow-up echocardiogram showed no evidence of mitral valve prolapse.  Her blood pressure at that time was 148/80.  Her lisinopril was increased to 20 mg daily.  Follow-up was planned for 12 months.  She was seen in follow-up by [name], NP-C on 06/22/2022.  She noted dyspnea on exertion, palpitations, orthostatic dizziness.  Her symptoms have been present for past couple  months.  She noted shortness of breath with exertion while walking and reported palpitations occasionally while she would stand up and with exertion.  Her Apple Watch showed a maximum heart rate which was elevated to 120.  She continued to have chronic chest pain with a pinching sensation in her left axillary region.  This discomfort has been present for years.  She attributed to spinal disc problems and myofascial pain dysfunction syndrome.  She also noted some aching in her legs which was occasional.  She noted some pain with walking however, she did note relief from her symptoms with leg elevation and heat application.  She was taking Lasix as needed.  She was tolerating medications well.  Her blood pressure at home is well-controlled.  She did note episodes of dizziness with changing positions but denied syncope, presyncope, lower extremity swelling, and weight changes.  She she was compliant with CPAP therapy.  08/03/2022 BMP was stable and follow-up echocardiogram 08/03/2022 showed an EF of 60 to 65%, G1 DD, and no significant valvular abnormalities.  She presents to the clinic today for follow-up evaluation and states***  *** denies chest pain, shortness of breath, lower extremity edema, fatigue, palpitations, melena, hematuria, hemoptysis, diaphoresis, weakness, presyncope, syncope, orthopnea, and PND.  DOE-underwent echocardiogram 08/03/2022 which showed normal LVEF and G1 DD with no significant valvular abnormalities.  Details above.  Lab work at that time was unremarkable.  Appears to be related to body habitus versus deconditioning. Increase physical activity as tolerated Heart healthy low-sodium diet No plans for further cardiac evaluation at this time  Palpitations-denies recent episodes.  Continue to monitor with Apple Watch. Avoid triggers caffeine, chocolate, EtOH, dehydration etc.  Cardiac event monitor 06/22/2022 showed sinus bradycardiac, sinus rhythm, sinus tachycardia PACs, brief runs  of PAT, PVCs and a 3 beat run of NSVT. Increase physical activity as tolerated  Essential hypertension-BP today***.  Previously had amlodipine decreased to 5 mg daily and lisinopril increased from 10 mg to 20 mg. Continue current medical therapy Heart healthy low-sodium diet Increase physical activity as tolerated  Orthostatic dizziness-does note some improvement with changing positions slowly and medication adjustment. Recommended lower extremity support stockings  Stage III CKD-stable. Follows with PCP  Disposition: Follow-up with Dr. Crenshaw in 6-9 months.     Home Medications    Prior to Admission medications   Medication Sig Start Date End Date Taking? Authorizing Provider  amLODipine (NORVASC) 5 MG tablet Take 1 tablet (5 mg total) by mouth daily. Patient not taking: Reported on 07/16/2022 06/22/22   Peck, Elizabeth, NP  Ascorbic Acid (VITAMIN C ADULT GUMMIES PO) Take 1 each by mouth daily.    [provider]  atorvastatin (LIPITOR) 40 MG tablet TAKE 1 TABLET BY MOUTH EVERYDAY AT BEDTIME 06/19/22   Lynn, Catherine, MD  baclofen (LIORESAL) 10 MG tablet TAKE 1 TABLET BY MOUTH TWICE A DAY AS NEEDED FOR MUSCLE SPASM 12/26/21   Lynn, Catherine, MD  BD PEN NEEDLE NANO 2ND GEN 32G X 4 MM MISC USE WITH INSULIN 02/21/21     Ezequiel Essex, MD  blood glucose meter kit and supplies Dispense based on patient and insurance preference. Use up to four times daily as directed. (FOR ICD-10 E10.9, E11.9). 03/09/22   Ezequiel Essex, MD  Continuous Blood Gluc Receiver ([place]) DEVI Please use as directed to check blood sugar. 01/17/22   Ezequiel Essex, MD  Continuous Blood Gluc Sensor (DEXCOM G6 SENSOR) MISC Use as directed to check blood sugar. 01/17/22   Ezequiel Essex, MD  Continuous Blood Gluc Transmit (DEXCOM G6 TRANSMITTER) MISC Use as directed to check blood sugar. 01/17/22   Ezequiel Essex, MD  dapagliflozin propanediol (FARXIGA) 10 MG TABS tablet Take 1 tablet (10 mg total) by  mouth daily before breakfast. This is a patient assistance medication. Patient may not be approved and/or have medication. Please ask and verify when performing med review. 09/27/21   [name], MD  dicyclomine (BENTYL) 20 MG tablet Take 1 tablet (20 mg total) by mouth 2 (two) times daily as needed for spasms. 10/15/21   [name], PA-C  DULoxetine (CYMBALTA) 60 MG capsule Take 1 capsule (60 mg total) by mouth daily. 04/30/22   Lovorn, [name], MD  ezetimibe (ZETIA) 10 MG tablet Take 1 tablet (10 mg total) by mouth daily. 07/16/22   Ezequiel Essex, MD  famotidine (PEPCID) 20 MG tablet Take 1 tablet (20 mg total) by mouth daily. 06/05/22   Ezequiel Essex, MD  fluticasone [facility]) 50 MCG/ACT nasal spray Place 1 spray into both nostrils daily as needed for allergies or rhinitis. 08/09/21   [name], DO  furosemide (LASIX) 40 MG tablet Take 1 tablet (40 mg total) by mouth as needed for edema. 06/22/22   Finis Bud, NP  gabapentin (NEURONTIN) 600 MG tablet TAKE 0.5-1 TABLET (300-600 MG TOTAL) BY MOUTH 3 (THREE) TIMES DAILY. 07/24/22   Ezequiel Essex, MD  glucose blood test strip Use as instructed 03/09/22   Ezequiel Essex, MD  insulin aspart (FIASP) 100 UNIT/ML FlexTouch Pen Inject 15 Units into the skin daily with breakfast AND 15 Units daily with lunch AND 12 Units daily with supper. This is a patient assistance medication. Patient may not be approved and/or have medication. Please ask and verify when performing med review.. 09/22/22   Ezequiel Essex, MD  insulin degludec (TRESIBA) 100 UNIT/ML FlexTouch Pen Inject 40 Units into the skin daily. This is a patient assistance medication. Patient may not be approved and/or have medication. Please ask and verify when performing med review. 09/13/22   Leavy Cella, RPH-CPP  lisinopril (ZESTRIL) 40 MG tablet Take 1 tablet (40 mg total) by mouth daily. 07/13/22   Finis Bud, NP  loratadine (CLARITIN) 10 MG tablet TAKE 1 TABLET EVERY DAY  01/18/22   Ezequiel Essex, MD  LORazepam (ATIVAN) 0.5 MG tablet Take 1 tablet (0.5 mg total) by mouth every 8 (eight) hours as needed for anxiety. Start 03/22/2022 07/24/22   Ezequiel Essex, MD  magnesium oxide (MAG-OX) 400 (241.3 Mg) MG tablet Take 1 tablet (400 mg total) by mouth 2 (two) times daily. 03/27/20   Regalado, [name], MD  metFORMIN (GLUCOPHAGE-XR) 750 MG 24 hr tablet TAKE 1 TABLET (750 MG TOTAL) BY MOUTH 2 (TWO) TIMES DAILY WITH A MEAL. 02/07/22   Ezequiel Essex, MD  Multiple Vitamin (MULTIVITAMIN PO) Take 1 tablet by mouth daily.    [provider]  ondansetron (ZOFRAN) 4 MG tablet Take 1 tablet (4 mg total) by mouth every 6 (six) hours. 10/15/21   [name], PA-C  OneTouch Delica Lancets 33G MISC Please use to check blood sugar up to 4 times daily. E11.42 03/09/22   Lynn, Catherine, MD  pantoprazole (PROTONIX) 40 MG tablet Take 1 tablet (40 mg total) by mouth daily. 07/24/22   Lynn, Catherine, MD  potassium chloride SA (KLOR-CON M20) 20 MEQ tablet Take 1 tablet (20 mEq total) by mouth as needed (TAKE WITH LASIX PRN EDEMA). 06/22/22   Peck, Elizabeth, NP  Semaglutide, 2 MG/DOSE, (OZEMPIC, 2 MG/DOSE,) 8 MG/3ML SOPN Inject 2 mg into the skin once a week. On Friday 02/12/22   Hensel, William A, MD  sucralfate (CARAFATE) 1 g tablet TAKE 1 TABLET FOUR TIMES DAILY, WITH MEALS AND AT BEDTIME (OKAY TO TRIAL OFF AS INSTRUCTED) 03/13/22   Lynn, Catherine, MD  traZODone (DESYREL) 50 MG tablet TAKE 1/2 TABLET BY MOUTH AT BEDTIME AS NEEDED FOR SLEEP. 01/17/22   Lynn, Catherine, MD    Family History    Family History  Problem Relation Age of Onset   Alcohol abuse Mother    Diabetes Mother    Hypertension Mother    Kidney disease Mother    COPD Father    Diabetes Father    Early death Father    Heart disease Father    Hyperlipidemia Father    Hypertension Father    Diabetes Sister    Hypertension Sister    Breast cancer Sister    Diabetes Sister    Hypertension Sister    Kidney  disease Sister    Cancer Maternal Grandmother    Alcohol abuse Maternal Grandfather    Cancer Maternal Grandfather    Cancer Paternal Grandmother    Cancer Paternal Grandfather    She indicated that her mother is deceased. She indicated that her father is deceased. She indicated that only one of her two sisters is alive. She indicated that her maternal grandmother is deceased. She indicated that her maternal grandfather is deceased. She indicated that her paternal grandmother is deceased. She indicated that her paternal grandfather is deceased.  Social History    Social History   Socioeconomic History   Marital status: Divorced    Spouse name: John   Number of children: 3   Years of education: 14   Highest education level: Associate degree: academic program  Occupational History   Occupation: disabled  Tobacco Use   Smoking status: Former    Packs/day: 1.00    Years: 3.00    Total pack years: 3.00    Types: Cigarettes    Quit date: 06/1967    Years since quitting: 55.4    Passive exposure: Past   Smokeless tobacco: Never  Vaping Use   Vaping Use: Never used  Substance and Sexual Activity   Alcohol use: Never   Drug use: Not Currently   Sexual activity: Not Currently  Other Topics Concern   Not on file  Social History Narrative   Patient lives in [place].    Patient walks her dog daily for exercise.    Patient is separated from her husband.   Patient does not drive. Uses home delivery services and [employer].    Right handed   One story apartment   Caffeine on daily   Social Determinants of Health   Financial Resource Strain: Low Risk  (03/19/2022)   Overall Financial Resource Strain (CARDIA)    Difficulty of Paying Living Expenses: Not hard at all  Food Insecurity: No Food Insecurity (03/19/2022)   Hunger Vital Sign    Worried About Running   Out of Food in the Last Year: Never true    Ran Out of Food in the Last Year: Never true  Transportation Needs: No  Transportation Needs (03/19/2022)   PRAPARE - Transportation    Lack of Transportation (Medical): No    Lack of Transportation (Non-Medical): No  Physical Activity: Sufficiently Active (03/19/2022)   Exercise Vital Sign    Days of Exercise per Week: 7 days    Minutes of Exercise per Session: 30 min  Stress: Stress Concern Present (03/19/2022)   [employer] - Occupational Stress Questionnaire    Feeling of Stress : Very much  Social Connections: Socially Isolated (03/19/2022)   Social Connection and Isolation Panel [NHANES]    Frequency of Communication with Friends and Family: Once a week    Frequency of Social Gatherings with Friends and Family: Once a week    Attends Religious Services: Never    Active Member of Clubs or Organizations: No    Attends Club or Organization Meetings: Never    Marital Status: Separated  Intimate Partner Violence: Not At Risk (03/19/2022)   Humiliation, Afraid, Rape, and Kick questionnaire    Fear of Current or Ex-Partner: No    Emotionally Abused: No    Physically Abused: No    Sexually Abused: No     Review of Systems    General:  No chills, fever, night sweats or weight changes.  Cardiovascular:  No chest pain, dyspnea on exertion, edema, orthopnea, palpitations, paroxysmal nocturnal dyspnea. Dermatological: No rash, lesions/masses Respiratory: No cough, dyspnea Urologic: No hematuria, dysuria Abdominal:   No nausea, vomiting, diarrhea, bright red blood per rectum, melena, or hematemesis Neurologic:  No visual changes, wkns, changes in mental status. All other systems reviewed and are otherwise negative except as noted above.  Physical Exam    VS:  There were no vitals taken for this visit. , BMI There is no height or weight on file to calculate BMI. GEN: Well nourished, well developed, in no acute distress. HEENT: normal. Neck: Supple, no JVD, carotid bruits, or masses. Cardiac: RRR, no murmurs, rubs, or gallops. No  clubbing, cyanosis, edema.  Radials/DP/PT 2+ and equal bilaterally.  Respiratory:  Respirations regular and unlabored, clear to auscultation bilaterally. GI: Soft, nontender, nondistended, BS + x 4. MS: no deformity or atrophy. Skin: warm and dry, no rash. Neuro:  Strength and sensation are intact. Psych: Normal affect.  Accessory Clinical Findings    Recent Labs: 05/21/2022: ALT 57 06/26/2022: Hemoglobin 15.0; Magnesium 2.1; Platelets 167; TSH 1.500 08/03/2022: BUN 20; Creatinine, Ser 1.05; Potassium 4.2; Sodium 141   Recent Lipid Panel    Component Value Date/Time   CHOL 184 06/26/2022 0824   TRIG 266 (H) 06/26/2022 0824   HDL 48 06/26/2022 0824   CHOLHDL 3.8 06/26/2022 0824   CHOLHDL 2.9 07/31/2019 1339   VLDL 38.2 07/18/2018 0940   LDLCALC 92 06/26/2022 0824   LDLCALC 48 07/31/2019 1339    No BP recorded.  {Refresh Note OR Click here to enter BP  :1}***    ECG personally reviewed by me today- *** - No acute changes  Cardiac event monitor 8/23 Patch Wear Time:  10 days and 0 hours (2023-08-09T16:38:26-0400 to 2023-08-19T16:58:30-0400)   Patient had a min HR of 55 bpm, max HR of 210 bpm, and avg HR of 75 bpm. Predominant underlying rhythm was Sinus Rhythm. 17 Supraventricular Tachycardia runs occurred, the run with the fastest interval lasting 5 beats with a max rate of 210   bpm, the  longest lasting 10.0 secs with an avg rate of 127 bpm. Some episode of Supraventricular Tachycardia may be possible Atrial Tachycardia with variable block. Supraventricular Tachycardia was detected within +/- 45 seconds of symptomatic patient event(s).  Isolated SVEs were rare (<1.0%), SVE Couplets were rare (<1.0%), and SVE Triplets were rare (<1.0%). Isolated VEs were rare (<1.0%, 835), VE Couplets were rare (<1.0%, 2), and VE Triplets were rare (<1.0%, 1).     Sinus bradycardia, NSR, sinus tachycardia; PACs, brief runs of PAT, PVCs and 3 beats NSVT Brian Crenshaw  Echocardiogram  08/03/2022  IMPRESSIONS     1. Left ventricular ejection fraction, by estimation, is 60 to 65%. The  left ventricle has normal function. The left ventricle has no regional  wall motion abnormalities. Left ventricular diastolic parameters are  consistent with Grade I diastolic  dysfunction (impaired relaxation). The average left ventricular global  longitudinal strain is -20.5 %. The global longitudinal strain is normal.   2. Right ventricular systolic function is normal. The right ventricular  size is normal.   3. The mitral valve is normal in structure. No evidence of mitral valve  regurgitation. No evidence of mitral stenosis.   4. The aortic valve is normal in structure. Aortic valve regurgitation is  not visualized. No aortic stenosis is present.   5. The inferior vena cava is normal in size with greater than 50%  respiratory variability, suggesting right atrial pressure of 3 mmHg.   Comparison(s): No significant change from prior study. Prior images  reviewed side by side. 03/26/20 EF 65-70%.   FINDINGS   Left Ventricle: Left ventricular ejection fraction, by estimation, is 60  to 65%. The left ventricle has normal function. The left ventricle has no  regional wall motion abnormalities. The average left ventricular global  longitudinal strain is -20.5 %.  The global longitudinal strain is normal. The left ventricular internal  cavity size was normal in size. There is no left ventricular hypertrophy.  Left ventricular diastolic parameters are consistent with Grade I  diastolic dysfunction (impaired  relaxation). Normal left ventricular filling pressure.   Right Ventricle: The right ventricular size is normal. No increase in  right ventricular wall thickness. Right ventricular systolic function is  normal.   Left Atrium: Left atrial size was normal in size.   Right Atrium: Right atrial size was normal in size.   Pericardium: There is no evidence of pericardial effusion.    Mitral Valve: The mitral valve is normal in structure. No evidence of  mitral valve regurgitation. No evidence of mitral valve stenosis.   Tricuspid Valve: The tricuspid valve is normal in structure. Tricuspid  valve regurgitation is not demonstrated. No evidence of tricuspid  stenosis.   Aortic Valve: The aortic valve is normal in structure. Aortic valve  regurgitation is not visualized. No aortic stenosis is present.   Pulmonic Valve: The pulmonic valve was normal in structure. Pulmonic valve  regurgitation is not visualized. No evidence of pulmonic stenosis.   Aorta: The aortic root is normal in size and structure.   Venous: The inferior vena cava is normal in size with greater than 50%  respiratory variability, suggesting right atrial pressure of 3 mmHg.   IAS/Shunts: No atrial level shunt detected by color flow Doppler.   Assessment & Plan   1.  ***   Jesse M. Cleaver NP-C     10/31/2022, 9:31 AM [facility] [address] Office [phone] Fax [phone]      I spent***minutes examining this patient, reviewing medications, and using patient centered shared decision making involving her cardiac care.  Prior to her visit I spent greater than 20 minutes reviewing her past medical history,  medications, and prior cardiac tests. 

## 2022-11-01 ENCOUNTER — Ambulatory Visit: Payer: Medicare PPO | Admitting: General Practice

## 2022-11-13 ENCOUNTER — Encounter: Payer: Self-pay | Admitting: Family Medicine

## 2022-11-13 DIAGNOSIS — Z794 Long term (current) use of insulin: Secondary | ICD-10-CM

## 2022-11-15 MED ORDER — DAPAGLIFLOZIN PROPANEDIOL 10 MG PO TABS: 10.0000 mg | ORAL_TABLET | Freq: Every day | ORAL | 3 refills | Status: DC

## 2022-11-15 NOTE — Addendum Note (Signed)
Addended by: Valetta Close on: 11/15/2022 12:36 AM   Modules accepted: Orders

## 2022-11-22 ENCOUNTER — Encounter (HOSPITAL_COMMUNITY): Payer: Self-pay | Admitting: Otolaryngology

## 2022-11-22 NOTE — Anesthesia Preprocedure Evaluation (Addendum)
Anesthesia Evaluation  Patient identified by MRN, date of birth, ID band Patient awake    Reviewed: Allergy & Precautions, NPO status , Patient's Chart, lab work & pertinent test results  Airway Mallampati: II  TM Distance: >3 FB Neck ROM: Full    Dental no notable dental hx. (+) Teeth Intact, Dental Advisory Given   Pulmonary asthma , sleep apnea (non compliant with CPAP) , former smoker   Pulmonary exam normal breath sounds clear to auscultation       Cardiovascular hypertension, Pt. on medications Normal cardiovascular exam+ dysrhythmias (atrial tachycardia)  Rhythm:Regular Rate:Normal  TTE 2023  1. Left ventricular ejection fraction, by estimation, is 60 to 65%. The  left ventricle has normal function. The left ventricle has no regional  wall motion abnormalities. Left ventricular diastolic parameters are  consistent with Grade I diastolic  dysfunction (impaired relaxation). The average left ventricular global  longitudinal strain is -20.5 %. The global longitudinal strain is normal.   2. Right ventricular systolic function is normal. The right ventricular  size is normal.   3. The mitral valve is normal in structure. No evidence of mitral valve  regurgitation. No evidence of mitral stenosis.   4. The aortic valve is normal in structure. Aortic valve regurgitation is  not visualized. No aortic stenosis is present.   5. The inferior vena cava is normal in size with greater than 50%  respiratory variability, suggesting right atrial pressure of 3 mmHg.     Neuro/Psych  Headaches PSYCHIATRIC DISORDERS Anxiety Depression       GI/Hepatic Neg liver ROS,GERD  ,,  Endo/Other  diabetes (dexcom LUE), Type 2, Insulin Dependent, Oral Hypoglycemic Agents    Renal/GU Renal InsufficiencyRenal disease  negative genitourinary   Musculoskeletal  (+) Arthritis ,  Fibromyalgia -  Abdominal   Peds  Hematology negative hematology  ROS (+)   Anesthesia Other Findings   Reproductive/Obstetrics                             Anesthesia Physical Anesthesia Plan  ASA: 3  Anesthesia Plan: General   Post-op Pain Management: Tylenol PO (pre-op)*   Induction: Intravenous  PONV Risk Score and Plan: 3 and Midazolam, Dexamethasone and Ondansetron  Airway Management Planned: Oral ETT  Additional Equipment:   Intra-op Plan:   Post-operative Plan: Extubation in OR  Informed Consent: I have reviewed the patients History and Physical, chart, labs and discussed the procedure including the risks, benefits and alternatives for the proposed anesthesia with the patient or authorized representative who has indicated his/her understanding and acceptance.     Dental advisory given  Plan Discussed with: CRNA  Anesthesia Plan Comments: (    )        Anesthesia Quick Evaluation

## 2022-11-22 NOTE — Progress Notes (Signed)
Anesthesia Chart Review: Same day workup  Follows with cardiology for history of paroxysmal atrial tachycardia, complex sleep apnea syndrome, palpitations, HTN, orthostatic hypotension, DOE. She was admitted to the hospital with chest pain in May 2021. Troponins were normal. Echocardiogram at that time showed normal LV function, mild LVH, grade 1 diastolic dysfunction, mild left atrial enlargement and trace mitral regurgitation. A monitor performed on June 2021 showed sinus with PACs, PVCs and brief atrial tachycardia. 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  Last seen by Finis Bud, NP 06/22/2022 and at that time reported more DOE and palpitations.  Echo and 14-day ZIO monitor were ordered.  Echo showed EF 60 to 65%, grade 1 DD, normal valves.  Event monitor showed sinus bradycardia, NSR, sinus tachycardia, PACs, brief runs of PAT, PVCs and 3 beats of NSVT.  History of CKD 3.  IDDM 2.A1c 6.8 on 09/21/2022.  Patient is also on once weekly GLP-1 agonist Ozempic.  Patient will need day of surgery labs and evaluation.  EKG 06/22/2022: NSR.  Rate 85.  Low voltage QRS.  Nonspecific T wave normality.  Event monitor 07/20/2022: Patient had a min HR of 55 bpm, max HR of 210 bpm, and avg HR of 75 bpm. Predominant underlying rhythm was Sinus Rhythm. 17 Supraventricular Tachycardia runs occurred, the run with the fastest interval lasting 5 beats with a max rate of 210 bpm, the  longest lasting 10.0 secs with an avg rate of 127 bpm. Some episode of Supraventricular Tachycardia may be possible Atrial Tachycardia with variable block. Supraventricular Tachycardia was detected within +/- 45 seconds of symptomatic patient event(s).  Isolated SVEs were rare (<1.0%), SVE Couplets were rare (<1.0%), and SVE Triplets were rare (<1.0%). Isolated VEs were rare (<1.0%, 835), VE Couplets were rare (<1.0%, 2), and VE Triplets were rare (<1.0%, 1).      Sinus bradycardia, NSR, sinus tachycardia; PACs, brief runs of PAT, PVCs and 3 beats NSVT Aaron Edelman Crenshaw  TTE 08/03/2022:  1. Left ventricular ejection fraction, by estimation, is 60 to 65%. The  left ventricle has normal function. The left ventricle has no regional  wall motion abnormalities. Left ventricular diastolic parameters are  consistent with Grade I diastolic  dysfunction (impaired relaxation). The average left ventricular global  longitudinal strain is -20.5 %. The global longitudinal strain is normal.   2. Right ventricular systolic function is normal. The right ventricular  size is normal.   3. The mitral valve is normal in structure. No evidence of mitral valve  regurgitation. No evidence of mitral stenosis.   4. The aortic valve is normal in structure. Aortic valve regurgitation is  not visualized. No aortic stenosis is present.   5. The inferior vena cava is normal in size with greater than 50%  respiratory variability, suggesting right atrial pressure of 3 mmHg.   Comparison(s): No significant change from prior study. Prior images  reviewed side by side. 03/26/20 EF 65-70%.   Coronary CT 03/26/2020: IMPRESSION: 1. No evidence of CAD, CADRADS = 0. 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   2. Coronary calcium score of 0.   3. Normal coronary origin with right dominance.    Wynonia Musty [facility] Stay [facility]/Anesthesiology Phone [phone] 11/22/2022 9:50 AM

## 2022-11-22 NOTE — Progress Notes (Signed)
PCP - Dr Ezequiel Essex Cardiologist - Dr Kirk Ruths Neurology - Metta Clines, DO  Chest x-ray - n/a EKG - 06/22/22 Stress Test - Yes - yrs ago ECHO - 08/03/22 Cardiac Cath - n/a  ICD Pacemaker/Loop - n/a  Sleep Study -  Yes CPAP - does not use CPAP  Do not take Metformin or Farxiga on the morning of surgery.  Hold Farxiga 72 hours prior to surgery.  Last Farxiga dose was on 11/16/22.  THE NIGHT BEFORE SURGERY, do not take Insulin Aspart Insulin bedtime dose.      THE MORNING OF SURGERY, do not take Insulin Aspart Insulin unless your CBG is greater than 220 mg/dL.  If your CBG is greater than 220 mg/dL, you may take  of your sliding scale (correction) dose of insulin.    If your blood sugar is less than 70 mg/dL, you will need to treat for low blood sugar: Treat a low blood sugar (less than 70 mg/dL) with  cup of clear juice (cranberry or apple), 4 glucose tablets, OR glucose gel. Recheck blood sugar in 15 minutes after treatment (to make sure it is greater than 70 mg/dL). If your blood sugar is not greater than 70 mg/dL on recheck, call [phone] for further instructions.  ERAS: Clear liquids til 6:25 AM DOS  Anesthesia review: Yes  STOP now taking any Aspirin (unless otherwise instructed by your surgeon), Aleve, Naproxen, Ibuprofen, Motrin, Advil, Goody's, BC's, all herbal medications, fish oil, and all vitamins.   Coronavirus Screening Covid test on DOS Do you have any of the following symptoms:  Cough yes/no: No Fever (>100.64F)  yes/no: No Runny nose yes/no: No Sore throat yes/no: No Difficulty breathing/shortness of breath  yes/no: No  Have you traveled in the last 14 days and where? yes/no: No  Patient verbalized understanding of instructions that were given via phone.

## 2022-11-23 ENCOUNTER — Encounter (HOSPITAL_COMMUNITY): Payer: Self-pay | Admitting: Otolaryngology

## 2022-11-23 ENCOUNTER — Ambulatory Visit (HOSPITAL_COMMUNITY): Payer: Medicare PPO | Admitting: Physician Assistant

## 2022-11-23 ENCOUNTER — Other Ambulatory Visit: Payer: Self-pay

## 2022-11-23 ENCOUNTER — Encounter (HOSPITAL_COMMUNITY)
Admission: RE | Disposition: A | Discharge status: Home / Self Care | Payer: Self-pay | Source: Ambulatory Visit | Attending: Otolaryngology

## 2022-11-23 ENCOUNTER — Observation Stay (HOSPITAL_COMMUNITY)
Admission: RE | Admit: 2022-11-23 | Discharge: 2022-11-24 | Disposition: A | Discharge status: Home / Self Care | Payer: Medicare PPO | Source: Ambulatory Visit | Attending: Otolaryngology | Admitting: Otolaryngology

## 2022-11-23 ENCOUNTER — Ambulatory Visit (HOSPITAL_BASED_OUTPATIENT_CLINIC_OR_DEPARTMENT_OTHER): Payer: Medicare PPO | Admitting: Physician Assistant

## 2022-11-23 DIAGNOSIS — R0981 Nasal congestion: Secondary | ICD-10-CM | POA: Insufficient documentation

## 2022-11-23 DIAGNOSIS — N183 Chronic kidney disease, stage 3 unspecified: Secondary | ICD-10-CM | POA: Insufficient documentation

## 2022-11-23 DIAGNOSIS — Z79899 Other long term (current) drug therapy: Secondary | ICD-10-CM | POA: Insufficient documentation

## 2022-11-23 DIAGNOSIS — I10 Essential (primary) hypertension: Secondary | ICD-10-CM

## 2022-11-23 DIAGNOSIS — Z87891 Personal history of nicotine dependence: Secondary | ICD-10-CM | POA: Diagnosis not present

## 2022-11-23 DIAGNOSIS — J343 Hypertrophy of nasal turbinates: Secondary | ICD-10-CM | POA: Diagnosis not present

## 2022-11-23 DIAGNOSIS — E1122 Type 2 diabetes mellitus with diabetic chronic kidney disease: Secondary | ICD-10-CM | POA: Insufficient documentation

## 2022-11-23 DIAGNOSIS — Z7984 Long term (current) use of oral hypoglycemic drugs: Secondary | ICD-10-CM | POA: Insufficient documentation

## 2022-11-23 DIAGNOSIS — J309 Allergic rhinitis, unspecified: Secondary | ICD-10-CM

## 2022-11-23 DIAGNOSIS — I129 Hypertensive chronic kidney disease with stage 1 through stage 4 chronic kidney disease, or unspecified chronic kidney disease: Secondary | ICD-10-CM | POA: Insufficient documentation

## 2022-11-23 DIAGNOSIS — J320 Chronic maxillary sinusitis: Secondary | ICD-10-CM | POA: Insufficient documentation

## 2022-11-23 DIAGNOSIS — J45909 Unspecified asthma, uncomplicated: Secondary | ICD-10-CM | POA: Insufficient documentation

## 2022-11-23 DIAGNOSIS — G4730 Sleep apnea, unspecified: Secondary | ICD-10-CM | POA: Diagnosis not present

## 2022-11-23 DIAGNOSIS — J341 Cyst and mucocele of nose and nasal sinus: Secondary | ICD-10-CM | POA: Diagnosis not present

## 2022-11-23 DIAGNOSIS — J329 Chronic sinusitis, unspecified: Secondary | ICD-10-CM | POA: Diagnosis not present

## 2022-11-23 DIAGNOSIS — Z794 Long term (current) use of insulin: Secondary | ICD-10-CM | POA: Diagnosis not present

## 2022-11-23 DIAGNOSIS — K116 Mucocele of salivary gland: Secondary | ICD-10-CM

## 2022-11-23 HISTORY — PX: SINUS ENDO W/FUSION: SHX777

## 2022-11-23 HISTORY — PX: NASAL TURBINATE REDUCTION: SHX2072

## 2022-11-23 HISTORY — PX: MAXILLARY ANTROSTOMY: SHX2003

## 2022-11-23 HISTORY — PX: ETHMOIDECTOMY: SHX5197

## 2022-11-23 LAB — CBC
HCT: 49.4 % — ABNORMAL HIGH (ref 36.0–46.0)
Hemoglobin: 17 g/dL — ABNORMAL HIGH (ref 12.0–15.0)
MCH: 29.7 pg (ref 26.0–34.0)
MCHC: 34.4 g/dL (ref 30.0–36.0)
MCV: 86.4 fL (ref 80.0–100.0)
Platelets: 182 10*3/uL (ref 150–400)
RBC: 5.72 MIL/uL — ABNORMAL HIGH (ref 3.87–5.11)
RDW: 13.6 % (ref 11.5–15.5)
WBC: 8.2 10*3/uL (ref 4.0–10.5)
nRBC: 0 % (ref 0.0–0.2)

## 2022-11-23 LAB — GLUCOSE, CAPILLARY
Glucose-Capillary: 129 mg/dL — ABNORMAL HIGH (ref 70–99)
Glucose-Capillary: 130 mg/dL — ABNORMAL HIGH (ref 70–99)
Glucose-Capillary: 153 mg/dL — ABNORMAL HIGH (ref 70–99)
Glucose-Capillary: 166 mg/dL — ABNORMAL HIGH (ref 70–99)
Glucose-Capillary: 240 mg/dL — ABNORMAL HIGH (ref 70–99)
Glucose-Capillary: 270 mg/dL — ABNORMAL HIGH (ref 70–99)
Glucose-Capillary: 317 mg/dL — ABNORMAL HIGH (ref 70–99)

## 2022-11-23 LAB — COMPREHENSIVE METABOLIC PANEL
ALT: 49 U/L — ABNORMAL HIGH (ref 0–44)
AST: 32 U/L (ref 15–41)
Albumin: 4.1 g/dL (ref 3.5–5.0)
Alkaline Phosphatase: 92 U/L (ref 38–126)
Anion gap: 9 (ref 5–15)
BUN: 13 mg/dL (ref 8–23)
CO2: 23 mmol/L (ref 22–32)
Calcium: 9.5 mg/dL (ref 8.9–10.3)
Chloride: 104 mmol/L (ref 98–111)
Creatinine, Ser: 0.96 mg/dL (ref 0.44–1.00)
GFR, Estimated: >60 mL/min (ref >60)
Glucose, Bld: 147 mg/dL — ABNORMAL HIGH (ref 70–99)
Potassium: 3.6 mmol/L (ref 3.5–5.1)
Sodium: 136 mmol/L (ref 135–145)
Total Bilirubin: 0.7 mg/dL (ref 0.3–1.2)
Total Protein: 7 g/dL (ref 6.5–8.1)

## 2022-11-23 LAB — HEMOGLOBIN A1C
Hgb A1c MFr Bld: 7.1 % — ABNORMAL HIGH (ref 4.8–5.6)
Mean Plasma Glucose: 157 mg/dL

## 2022-11-23 SURGERY — SINUS SURGERY, ENDOSCOPIC, USING COMPUTER-ASSISTED NAVIGATION
Anesthesia: General | Laterality: Left

## 2022-11-23 MED ORDER — LIDOCAINE 2% (20 MG/ML) 5 ML SYRINGE
INTRAMUSCULAR | Status: AC
  Filled 2022-11-23: qty 5

## 2022-11-23 MED ORDER — ONDANSETRON HCL 4 MG/2ML IJ SOLN: 4.0000 mg | INTRAMUSCULAR | Status: DC | PRN

## 2022-11-23 MED ORDER — LISINOPRIL 20 MG PO TABS
40.0000 mg | ORAL_TABLET | Freq: Every day | ORAL | Status: DC
  Administered 2022-11-24: 40 mg via ORAL
  Filled 2022-11-23: qty 2

## 2022-11-23 MED ORDER — LIDOCAINE-EPINEPHRINE 1 %-1:100000 IJ SOLN
INTRAMUSCULAR | Status: AC
  Filled 2022-11-23: qty 1

## 2022-11-23 MED ORDER — FENTANYL CITRATE (PF) 100 MCG/2ML IJ SOLN
INTRAMUSCULAR | Status: AC
  Filled 2022-11-23: qty 2

## 2022-11-23 MED ORDER — PROPOFOL 10 MG/ML IV BOLUS
INTRAVENOUS | Status: DC | PRN
  Administered 2022-11-23: 50 mg via INTRAVENOUS
  Administered 2022-11-23: 100 mg via INTRAVENOUS
  Administered 2022-11-23: 50 mg via INTRAVENOUS

## 2022-11-23 MED ORDER — AMLODIPINE BESYLATE 5 MG PO TABS: 5.0000 mg | ORAL_TABLET | Freq: Every day | ORAL | Status: DC

## 2022-11-23 MED ORDER — SUCRALFATE 1 G PO TABS
1.0000 g | ORAL_TABLET | Freq: Three times a day (TID) | ORAL | Status: DC
  Administered 2022-11-23 – 2022-11-24 (×2): 1 g via ORAL
  Filled 2022-11-23 (×3): qty 1

## 2022-11-23 MED ORDER — PREDNISONE 10 MG PO TABS: ORAL_TABLET | ORAL | 0 refills | Status: AC

## 2022-11-23 MED ORDER — SALINE SPRAY 0.65 % NA SOLN
2.0000 | NASAL | Status: DC
  Administered 2022-11-23 – 2022-11-24 (×4): 2 via NASAL
  Filled 2022-11-23: qty 44

## 2022-11-23 MED ORDER — ACETAMINOPHEN 160 MG/5ML PO SOLN: 650.0000 mg | ORAL | Status: DC | PRN

## 2022-11-23 MED ORDER — PROPOFOL 10 MG/ML IV BOLUS
INTRAVENOUS | Status: AC
  Filled 2022-11-23: qty 20

## 2022-11-23 MED ORDER — ONDANSETRON HCL 4 MG PO TABS: 4.0000 mg | ORAL_TABLET | ORAL | Status: DC | PRN

## 2022-11-23 MED ORDER — EPINEPHRINE 1 MG/10ML IJ SOSY
PREFILLED_SYRINGE | INTRAMUSCULAR | Status: AC
  Filled 2022-11-23: qty 10

## 2022-11-23 MED ORDER — CHLORHEXIDINE GLUCONATE 0.12 % MT SOLN
OROMUCOSAL | Status: AC
  Administered 2022-11-23: 15 mL via OROMUCOSAL
  Filled 2022-11-23: qty 15

## 2022-11-23 MED ORDER — SUCCINYLCHOLINE CHLORIDE 200 MG/10ML IV SOSY
PREFILLED_SYRINGE | INTRAVENOUS | Status: AC
  Filled 2022-11-23: qty 10

## 2022-11-23 MED ORDER — OXYMETAZOLINE HCL 0.05 % NA SOLN
3.0000 | NASAL | Status: DC | PRN
  Administered 2022-11-23: 3 via NASAL
  Filled 2022-11-23 (×2): qty 30

## 2022-11-23 MED ORDER — DEXAMETHASONE SODIUM PHOSPHATE 10 MG/ML IJ SOLN
INTRAMUSCULAR | Status: DC | PRN
  Administered 2022-11-23: 4 mg via INTRAVENOUS

## 2022-11-23 MED ORDER — EZETIMIBE 10 MG PO TABS
10.0000 mg | ORAL_TABLET | Freq: Every day | ORAL | Status: DC
  Administered 2022-11-24: 10 mg via ORAL
  Filled 2022-11-23: qty 1

## 2022-11-23 MED ORDER — SODIUM CHLORIDE (PF) 0.9 % IJ SOLN
INTRAMUSCULAR | Status: AC
  Filled 2022-11-23: qty 20

## 2022-11-23 MED ORDER — HYDROCODONE-ACETAMINOPHEN 5-325 MG PO TABS: 1.0000 | ORAL_TABLET | Freq: Four times a day (QID) | ORAL | 0 refills | Status: AC | PRN

## 2022-11-23 MED ORDER — LACTATED RINGERS IV SOLN: INTRAVENOUS | Status: DC

## 2022-11-23 MED ORDER — PHENYLEPHRINE 80 MCG/ML (10ML) SYRINGE FOR IV PUSH (FOR BLOOD PRESSURE SUPPORT)
PREFILLED_SYRINGE | INTRAVENOUS | Status: AC
  Filled 2022-11-23: qty 10

## 2022-11-23 MED ORDER — LORAZEPAM 0.5 MG PO TABS
0.5000 mg | ORAL_TABLET | Freq: Three times a day (TID) | ORAL | Status: DC | PRN
  Administered 2022-11-23: 0.5 mg via ORAL
  Filled 2022-11-23: qty 1

## 2022-11-23 MED ORDER — FENTANYL CITRATE (PF) 250 MCG/5ML IJ SOLN
INTRAMUSCULAR | Status: AC
  Filled 2022-11-23: qty 5

## 2022-11-23 MED ORDER — FENTANYL CITRATE (PF) 250 MCG/5ML IJ SOLN
INTRAMUSCULAR | Status: DC | PRN
  Administered 2022-11-23: 100 ug via INTRAVENOUS

## 2022-11-23 MED ORDER — OXYMETAZOLINE HCL 0.05 % NA SOLN
NASAL | Status: AC
  Filled 2022-11-23: qty 30

## 2022-11-23 MED ORDER — HYDROCODONE-ACETAMINOPHEN 5-325 MG PO TABS
1.0000 | ORAL_TABLET | Freq: Four times a day (QID) | ORAL | Status: DC | PRN
  Administered 2022-11-23: 1 via ORAL
  Filled 2022-11-23: qty 1

## 2022-11-23 MED ORDER — ATORVASTATIN CALCIUM 10 MG PO TABS: 10.0000 mg | ORAL_TABLET | Freq: Every day | ORAL | Status: DC

## 2022-11-23 MED ORDER — IBUPROFEN 200 MG PO TABS: 600.0000 mg | ORAL_TABLET | Freq: Four times a day (QID) | ORAL | Status: DC | PRN

## 2022-11-23 MED ORDER — 0.9 % SODIUM CHLORIDE (POUR BTL) OPTIME
TOPICAL | Status: DC | PRN
  Administered 2022-11-23: 500 mL

## 2022-11-23 MED ORDER — LIDOCAINE-EPINEPHRINE 1 %-1:100000 IJ SOLN
INTRAMUSCULAR | Status: DC | PRN
  Administered 2022-11-23: 10 mL

## 2022-11-23 MED ORDER — EPINEPHRINE HCL (NASAL) 0.1 % NA SOLN
NASAL | Status: AC
  Filled 2022-11-23: qty 30

## 2022-11-23 MED ORDER — INSULIN ASPART 100 UNIT/ML IJ SOLN
0.0000 [IU] | INTRAMUSCULAR | Status: DC
  Administered 2022-11-23: 5 [IU] via SUBCUTANEOUS
  Administered 2022-11-23: 11 [IU] via SUBCUTANEOUS
  Administered 2022-11-23: 3 [IU] via SUBCUTANEOUS
  Administered 2022-11-23: 8 [IU] via SUBCUTANEOUS
  Administered 2022-11-24 (×2): 3 [IU] via SUBCUTANEOUS

## 2022-11-23 MED ORDER — POTASSIUM CHLORIDE CRYS ER 20 MEQ PO TBCR: 20.0000 meq | EXTENDED_RELEASE_TABLET | ORAL | Status: DC | PRN

## 2022-11-23 MED ORDER — ROCURONIUM BROMIDE 10 MG/ML (PF) SYRINGE
PREFILLED_SYRINGE | INTRAVENOUS | Status: AC
  Filled 2022-11-23: qty 10

## 2022-11-23 MED ORDER — PHENYLEPHRINE 80 MCG/ML (10ML) SYRINGE FOR IV PUSH (FOR BLOOD PRESSURE SUPPORT)
PREFILLED_SYRINGE | INTRAVENOUS | Status: DC | PRN
  Administered 2022-11-23: 160 ug via INTRAVENOUS
  Administered 2022-11-23: 80 ug via INTRAVENOUS

## 2022-11-23 MED ORDER — DULOXETINE HCL 60 MG PO CPEP
60.0000 mg | ORAL_CAPSULE | Freq: Every day | ORAL | Status: DC
  Administered 2022-11-24: 60 mg via ORAL
  Filled 2022-11-23: qty 1

## 2022-11-23 MED ORDER — SUGAMMADEX SODIUM 200 MG/2ML IV SOLN
INTRAVENOUS | Status: DC | PRN
  Administered 2022-11-23: 200 mg via INTRAVENOUS

## 2022-11-23 MED ORDER — DICYCLOMINE HCL 20 MG PO TABS: 20.0000 mg | ORAL_TABLET | Freq: Two times a day (BID) | ORAL | Status: DC | PRN

## 2022-11-23 MED ORDER — PANTOPRAZOLE SODIUM 40 MG PO TBEC
40.0000 mg | DELAYED_RELEASE_TABLET | Freq: Every day | ORAL | Status: DC
  Administered 2022-11-24: 40 mg via ORAL
  Filled 2022-11-23: qty 1

## 2022-11-23 MED ORDER — ORAL CARE MOUTH RINSE: 15.0000 mL | Freq: Once | OROMUCOSAL | Status: AC

## 2022-11-23 MED ORDER — FUROSEMIDE 40 MG PO TABS: 40.0000 mg | ORAL_TABLET | ORAL | Status: DC | PRN

## 2022-11-23 MED ORDER — CEFADROXIL 500 MG PO CAPS: 500.0000 mg | ORAL_CAPSULE | Freq: Two times a day (BID) | ORAL | 0 refills | Status: AC

## 2022-11-23 MED ORDER — PHENYLEPHRINE HCL-NACL 20-0.9 MG/250ML-% IV SOLN
INTRAVENOUS | Status: DC | PRN
  Administered 2022-11-23: 25 ug/min via INTRAVENOUS

## 2022-11-23 MED ORDER — ONDANSETRON HCL 4 MG/2ML IJ SOLN
INTRAMUSCULAR | Status: DC | PRN
  Administered 2022-11-23: 4 mg via INTRAVENOUS

## 2022-11-23 MED ORDER — MIDAZOLAM HCL 2 MG/2ML IJ SOLN
INTRAMUSCULAR | Status: DC | PRN
  Administered 2022-11-23: 2 mg via INTRAVENOUS

## 2022-11-23 MED ORDER — SENNOSIDES-DOCUSATE SODIUM 8.6-50 MG PO TABS: 1.0000 | ORAL_TABLET | Freq: Every evening | ORAL | Status: DC | PRN

## 2022-11-23 MED ORDER — ONDANSETRON HCL 4 MG/2ML IJ SOLN
INTRAMUSCULAR | Status: AC
  Filled 2022-11-23: qty 2

## 2022-11-23 MED ORDER — MIDAZOLAM HCL 2 MG/2ML IJ SOLN
INTRAMUSCULAR | Status: AC
  Filled 2022-11-23: qty 2

## 2022-11-23 MED ORDER — GABAPENTIN 300 MG PO CAPS
600.0000 mg | ORAL_CAPSULE | Freq: Two times a day (BID) | ORAL | Status: DC
  Administered 2022-11-23 – 2022-11-24 (×2): 600 mg via ORAL
  Filled 2022-11-23 (×2): qty 2

## 2022-11-23 MED ORDER — DEXAMETHASONE SODIUM PHOSPHATE 10 MG/ML IJ SOLN
INTRAMUSCULAR | Status: AC
  Filled 2022-11-23: qty 1

## 2022-11-23 MED ORDER — ACETAMINOPHEN 500 MG PO TABS
1000.0000 mg | ORAL_TABLET | Freq: Once | ORAL | Status: AC
  Administered 2022-11-23: 1000 mg via ORAL
  Filled 2022-11-23: qty 2

## 2022-11-23 MED ORDER — CHLORHEXIDINE GLUCONATE 0.12 % MT SOLN: 15.0000 mL | Freq: Once | OROMUCOSAL | Status: AC

## 2022-11-23 MED ORDER — ROCURONIUM BROMIDE 10 MG/ML (PF) SYRINGE
PREFILLED_SYRINGE | INTRAVENOUS | Status: DC | PRN
  Administered 2022-11-23: 80 mg via INTRAVENOUS

## 2022-11-23 MED ORDER — ATROPINE SULFATE 0.4 MG/ML IV SOLN
INTRAVENOUS | Status: AC
  Filled 2022-11-23: qty 1

## 2022-11-23 MED ORDER — CEFAZOLIN SODIUM-DEXTROSE 2-4 GM/100ML-% IV SOLN
2.0000 g | INTRAVENOUS | Status: AC
  Administered 2022-11-23: 2 g via INTRAVENOUS
  Filled 2022-11-23: qty 100

## 2022-11-23 MED ORDER — TRAZODONE HCL 50 MG PO TABS: 50.0000 mg | ORAL_TABLET | Freq: Every evening | ORAL | Status: DC | PRN

## 2022-11-23 MED ORDER — DEXMEDETOMIDINE HCL IN NACL 80 MCG/20ML IV SOLN
INTRAVENOUS | Status: AC
  Filled 2022-11-23: qty 20

## 2022-11-23 MED ORDER — ACETAMINOPHEN 650 MG RE SUPP: 650.0000 mg | RECTAL | Status: DC | PRN

## 2022-11-23 MED ORDER — LIDOCAINE 2% (20 MG/ML) 5 ML SYRINGE
INTRAMUSCULAR | Status: DC | PRN
  Administered 2022-11-23: 60 mg via INTRAVENOUS

## 2022-11-23 MED ORDER — FENTANYL CITRATE (PF) 100 MCG/2ML IJ SOLN
25.0000 ug | INTRAMUSCULAR | Status: DC | PRN
  Administered 2022-11-23 (×2): 50 ug via INTRAVENOUS

## 2022-11-23 SURGICAL SUPPLY — 48 items
ATTRACTOMAT 16X20 MAGNETIC DRP (DRAPES) IMPLANT
BAG COUNTER SPONGE SURGICOUNT (BAG) ×2 IMPLANT
BALLN FRONTAL NUVENT 6X17 (BALLOONS)
BALLOON FRONTAL NUVENT 6X17 (BALLOONS) IMPLANT
BLADE ROTATE RAD 40 4 M4 (BLADE) IMPLANT
BLADE ROTATE TRICUT 4X13 M4 (BLADE) ×2 IMPLANT
BLADE SURG 15 STRL LF DISP TIS (BLADE) IMPLANT
BLADE SURG 15 STRL SS (BLADE)
CANISTER SUCT 3000ML PPV (MISCELLANEOUS) ×4 IMPLANT
COAGULATOR SUCT 8FR VV (MISCELLANEOUS) IMPLANT
COAGULATOR SUCT SWTCH 10FR 6 (ELECTROSURGICAL) IMPLANT
DRAPE HALF SHEET 40X57 (DRAPES) IMPLANT
DRSG NASOPORE 8CM (GAUZE/BANDAGES/DRESSINGS) ×2 IMPLANT
ELECT COATED BLADE 2.86 ST (ELECTRODE) IMPLANT
ELECT REM PT RETURN 9FT ADLT (ELECTROSURGICAL) ×2
ELECTRODE REM PT RTRN 9FT ADLT (ELECTROSURGICAL) ×2 IMPLANT
FILTER ARTHROSCOPY CONVERTOR (FILTER) IMPLANT
GLOVE BIO SURGEON STRL SZ 6.5 (GLOVE) ×2 IMPLANT
HEMOSTAT ARISTA ABSORB 3G PWDR (HEMOSTASIS) IMPLANT
INFLATOR BALLN (BALLOONS)
INFLATOR BALLOON W/TUBE (BALLOONS) IMPLANT
KIT BASIN OR (CUSTOM PROCEDURE TRAY) ×2 IMPLANT
KIT TURNOVER KIT B (KITS) ×2 IMPLANT
NDL HYPO 25GX1X1/2 BEV (NEEDLE) ×4 IMPLANT
NDL SPNL 25GX3.5 QUINCKE BL (NEEDLE) ×2 IMPLANT
NEEDLE HYPO 25GX1X1/2 BEV (NEEDLE) ×4 IMPLANT
NEEDLE SPNL 25GX3.5 QUINCKE BL (NEEDLE) ×2 IMPLANT
NS IRRIG 1000ML POUR BTL (IV SOLUTION) ×2 IMPLANT
PAD ARMBOARD 7.5X6 YLW CONV (MISCELLANEOUS) ×4 IMPLANT
PATTIES SURGICAL .5 X3 (DISPOSABLE) ×2 IMPLANT
PENCIL SMOKE EVACUATOR (MISCELLANEOUS) IMPLANT
SOL ANTI FOG 6CC (MISCELLANEOUS) ×2 IMPLANT
SPLINT NASAL DOYLE BI-VL (GAUZE/BANDAGES/DRESSINGS) IMPLANT
SPLINT NASAL POSISEP X .6X2 (GAUZE/BANDAGES/DRESSINGS) ×2 IMPLANT
SUT CHROMIC 4 0 P 3 18 (SUTURE) IMPLANT
SUT PLAIN 4 0 ~~LOC~~ 1 (SUTURE) IMPLANT
SUT SILK 2 0 SH (SUTURE) ×2 IMPLANT
SWAB COLLECTION DEVICE MRSA (MISCELLANEOUS) IMPLANT
SWAB CULTURE ESWAB REG 1ML (MISCELLANEOUS) IMPLANT
SYR CONTROL 10ML LL (SYRINGE) IMPLANT
SYR TB 1ML LUER SLIP (SYRINGE) ×4 IMPLANT
TOWEL GREEN STERILE FF (TOWEL DISPOSABLE) ×2 IMPLANT
TRACKER ENT INSTRUMENT (MISCELLANEOUS) ×4 IMPLANT
TRACKER ENT PATIENT (MISCELLANEOUS) ×2 IMPLANT
TRAY ENT MC OR (CUSTOM PROCEDURE TRAY) ×2 IMPLANT
TUBE CONNECTING 12X1/4 (SUCTIONS) ×2 IMPLANT
TUBING EXTENTION W/L.L. (IV SETS) IMPLANT
TUBING STRAIGHTSHOT EPS 5PK (TUBING) ×2 IMPLANT

## 2022-11-23 NOTE — Transfer of Care (Signed)
Immediate Anesthesia Transfer of Care Note  Patient: Cassandra Reid  Procedure(s) Performed: Functional endoscopic sinus surgery with navigation. (Left) Bilateral turbinate reduction (Bilateral) MAXILLARY ANTROSTOMY (Left) LEFT ANTERIOR ETHMOIDECTOMY (Left)  Patient Location: PACU  Anesthesia Type:General  Level of Consciousness: awake and alert   Airway & Oxygen Therapy: Patient Spontanous Breathing  Post-op Assessment: Report given to RN and Post -op Vital signs reviewed and stable  Post vital signs: Reviewed and stable  Last Vitals:  Vitals Value Taken Time  BP 174/75 11/23/22 1055  Temp 36.3 C 11/23/22 1055  Pulse 97 11/23/22 1056  Resp 20 11/23/22 1056  SpO2 96 % 11/23/22 1056  Vitals shown include unvalidated device data.  Last Pain:  Vitals:   11/23/22 0648  TempSrc:   PainSc: 0-No pain         Complications: No notable events documented.

## 2022-11-23 NOTE — Progress Notes (Signed)
Contacted Dr. Lanetta Inch regarding hyperglycemia protocol for pt. CBG 153 upon arrival, pt took 8 units of FIASP at 0400. Per Dr. Lanetta Inch stop protocol and continue to monitor CBG. Pt has a dexcom to LUE.   Jacqlyn Larsen, RN

## 2022-11-23 NOTE — Discharge Instructions (Signed)
[facility] ([facility]) Post Operative Instructions  Office: [phone]  The Surgery Itself Endoscopic sinus surgery (with or without septoplasty and turbinate reduction) involves general anesthesia, typically for one to two hours. Patients may be sedated for several hours after surgery and may remain sleepy for the better part of the day. Nausea and vomiting are occasionally seen, and usually resolve by the evening of surgery - even without additional medications. Almost all patients can go home the day of surgery.  After Surgery  Facial pressure and fullness similar to a sinus infection/headache is normal after surgery. Breathing through your nose is also difficult due to swelling. A humidifier or vaporizer can be used in the bedroom to prevent throat pain with mouth breathing.   Bloody nasal drainage is normal after this surgery for 5-7 days, usually decreasing in volume with each day that passes. Drainage will flow from the front of the nose and down the back of the throat. Make sure you spit out blood drainage that drips down the back of your throat to prevent nausea/vomiting. You will have a nasal drip pad/sling with gauze to catch drainage from the front of your nose. The dressing may need to be changed frequently during the first 24 hours following surgery. In case of profuse nasal bleeding, you may apply ice to the bridge of the nose and pinch the nose just above the tip and hold for 10 minutes; if bleeding continues, contact the doctors office.   Frequent hot showers or saline nasal rinses (NeilMed) will help break up congestion and clear any clot or mucus that builds up within the nose after surgery. This can be started the day after surgery.   It is more comfortable to sleep with extra pillows or in a recliner for the first few days after surgery until the drainage begins to resolve.    Do not blow your nose for 2 weeks after surgery.   Avoid lifting > 10 lbs. and no  vigorous exercise for 2 weeks after Surgery.   Avoid airplane travel for 2 weeks following sinus surgery; the cabin pressure changes can cause pain and swelling within the nose/sinuses.   Sense of smell and taste are often diminished for several weeks after surgery. There may be some tenderness or numbness in your upper front teeth, which is normal after surgery. You may express old clot, discolored mucus or very large nasal crusts from your nose for up to 3-4 weeks after surgery; depending on how frequently and how effectively you irrigate your nose with the saltwater spray.   You may have absorbable sutures inside of your nose after surgery that will slowly dissolve in 2-3 weeks. Be careful when clearing crusts from the nose since they may be attached to these sutures.  Medications  Pain medication can be used for pain as prescribed. Pain and pressure in the nose is expected after surgery. As the surgical site heals, pain will resolve over the course of a week. Pain medications can cause nausea, which can be prevented if you take them with food or milk.   You may be given an antibiotic for one week after surgery to prevent infection. Take this medication with food to prevent nausea or vomiting.   You can use 2 nasal sprays after surgery: Afrin can be used up to 2 times a day for up to 5 days after surgery (best before bed) to reduce bloody drainage from the nose for the first few days after surgery. Saline/salt   water spray can be used as often as you would like starting the day after surgery to prevent crusting inside of the nose.   Take all of your routine medications as prescribed, unless told otherwise by your surgeon. Any medications that thin the blood should be avoided. This includes aspirin. Avoid aspirin-like products for the first 72 hours after surgery (Advil, Motrin, Excedrin, Alieve, Celebrex, Naprosyn), but you may use them as needed for pain after 72 hours.   

## 2022-11-23 NOTE — Anesthesia Postprocedure Evaluation (Signed)
Anesthesia Post Note  Patient: Cassandra Reid  Procedure(s) Performed: Functional endoscopic sinus surgery with navigation. (Left) Bilateral turbinate reduction (Bilateral) MAXILLARY ANTROSTOMY (Left) LEFT ANTERIOR ETHMOIDECTOMY (Left)     Patient location during evaluation: PACU Anesthesia Type: General Level of consciousness: awake and alert Pain management: pain level controlled Vital Signs Assessment: post-procedure vital signs reviewed and stable Respiratory status: spontaneous breathing, nonlabored ventilation, respiratory function stable and patient connected to nasal cannula oxygen Cardiovascular status: blood pressure returned to baseline and stable Postop Assessment: no apparent nausea or vomiting Anesthetic complications: no  No notable events documented.  Last Vitals:  Vitals:   11/23/22 1415 11/23/22 1437  BP: (!) 172/73 (!) 192/82  Pulse: 80 88  Resp: 18 18  Temp: 36.7 C 36.8 C  SpO2: 97% 95%    Last Pain:  Vitals:   11/23/22 1437  TempSrc: Axillary  PainSc:                  [name] L Tobin Cadiente

## 2022-11-23 NOTE — Op Note (Signed)
OPERATIVE NOTE  Cassandra Reid Date/Time of Admission: 11/23/2022  5:37 AM  CSN: [id number] Attending Provider: Ebbie Latus A, DO Room/Bed: MCPO/NONE DOB: 1956/08/13 Age: 67 y.o.   Pre-Op Diagnosis: Mucous retention cyst of maxillary sinus  Nasal congestion Allergic rhinitis, unspecified seasonality, unspecified trigger  Post-Op Diagnosis: Mucous retention cyst of maxillary sinus  Nasal congestion Allergic rhinitis, unspecified seasonality, unspecified trigger  Procedure: Procedure(s): Functional endoscopic sinus surgery with navigation. MAXILLARY ANTROSTOMY WITH TISSUE REMOVAL LEFT ANTERIOR ETHMOIDECTOMY BILATERAL INFERIOR TURBINATE REDUCTION  Anesthesia: General  Surgeon(s): D[place], DO  Staff: Circulator: Everhart, Donnie Aho, RN Scrub Person: Amedeo Plenty, Amy E Circulator Assistant: Celene Squibb, RN  Implants: * No implants in log *  Specimens: ID Type Source Tests Collected by Time Destination  1 : LEFT SINUS CONTENTS Tissue PATH Sinus Contents/Nasal Polyps SURGICAL PATHOLOGY Lyrical Sowle, [place] A, DO 03/19/8840 [id number]     Complications: None  EBL: <10 ML  Condition: stable  Operative Findings:  Large mucous retention cyst occupying the entirety of the left maxillary sinus, bilateral inferior turbinate reduction  Description of Operation: Once operative consent was obtained and the site and surgery were confirmed with the patient and the operating room team, the patient was brought back to the operating room and general endotracheal anesthesia was obtained. The patient was turned over to the ENT service, at which time the image-guided system was attached and noted to be in good calibration. Lidocaine 1% with 1:100,000 epinephrine was injected into the inferior turbinates bilaterally, the left middle turbinate, and the axilla between the medial turbinate and the lateral nasal wall. Afrin-soaked pledgets were placed into the nasal cavity,  and the patient was prepped and draped in sterile fashion. Attention turned to the left-sided sinonasal cavity. The middle turbinate was medialized and a ball-tipped seeker was used to slightly anterior fracture the uncinate process. The uncinate process was completely fractured anteriorly and then removed with a combination of backbiter and a microdebrider. A ball-tipped seeker was used to identify the natural os of the maxillary sinus, and it was widened with combination of the backbiter, the microdebrider, the olive tip suction, and the straight TruCut until it was widely patent. Additional polyp tissue was removed using the shaver. A 60 degree shaver blade was then used to remove the remaining polyp tissue more anteriorly. Following this, inspection with a 45 degree Hopkins rods demonstrated no significant residual polyp tissue. Utilizing image-guided suction, the medial inferior quadrant of the ethmoid bulla was entered bluntly, the walls were fractured and the bulla was removed utilizing a microdebrider. Anterior ethmoidectomy was completed in a standard fashion by first locating the basal lamella of the middle turbinate and then following the skull base and the lamina papyracea to fully take down all anterior ethmoid cells. This was done utilizing a combination of the straight suction, the J curette, the ball-tipped seeker, and the microdebrider. Attention was then turned to the inferior turbinates. They were outfractured and then submucous resection was performed by making an incision in the leading edge with a 15 blade, separating the mucosa from bone with a Cottle elevator and then using the micro debrider via a turbinate blade to remove bone. Epinephrine soaked pledgets were placed in the left sinonasal cavity.  Pledgets were removed, copious irrigation was placed in the sinonasal cavities and Posisep absorbable nasal pack was placed lateral to the middle turbinate in the axilla between it and the lateral  nasal wall on the left. An orogastric tube was placed and the  stomach cavity was suctioned to reduce postoperative nausea. The patient was turned over to anesthesia service and was extubated in the operating room and transferred to the PACU in stable condition.    Cassandra Reid, [facility]  11/23/2022

## 2022-11-23 NOTE — H&P (Signed)
Cassandra Reid is an 67 y.o. female.    Chief Complaint:  Left facial pain  HPI: Patient presents today for planned elective procedure.  She denies any interval change in history since office visit on 08/30/2022:  Cassandra Reid is Cassandra 67 y.o. female who presents as Cassandra return consult, referred by Alesia Banda*, for evaluation and treatment of facial pain and mucous retention cyst. Patient was last seen in our office on 02/06/2021 for follow-up of her symptoms. She had had Cassandra CT scan at that time which demonstrated persistence of large maxillary retention cyst. We had discussed option of left maxillary antrostomy and bilateral inferior turbinate reduction, however patient had opted to hold off. She states that since that time, her mental health has improved and she has continued to follow with neurology. She is on new medications for neuropathic pain, but endorses persistent facial pressure, which radiates into her teeth. She states that her sense of smell has been disrupted since her brain surgery, but she endorses normal sense of taste. She has not been treated with any oral antibiotics in the last 12 months for sinusitis. She has had Cassandra sleep study, and is currently on CPAP with good adherence.   Past Medical History:  Diagnosis Date   Allergic rhinitis 09/26/2018   Anosmia    Arthritis    Asthma    Balance problems 09/22/2021   C. difficile colitis 07/18/2018   Cerebral aneurysm without rupture    Chest pain 03/26/2020   Chronic pain syndrome 04/23/2019   CKD stage 3 due to type 2 diabetes mellitus 04/15/2019   DDD (degenerative disc disease), cervical 04/16/2019   Encephalomalacia on imaging study 08/25/2020   Brain MRI 08/25/21: Few scattered areas of encephalomalacia and gliosis within the overlying left frontal lobe most likely postoperative in nature.   Fatty liver 05/31/2020   Fibromyalgia    Former smoker    Generalized anxiety disorder    GERD (gastroesophageal reflux disease)     Heart murmur    History of chicken pox    History of colon polyps    Hyperlipidemia associated with type 2 diabetes mellitus 07/18/2018   Hypertension associated with diabetes 07/18/2018   IBS (irritable bowel syndrome)    Incontinence 09/22/2021   Bowel and bladder.    Ischemic brain injury 05/29/2021   Lacunar infarction 05/25/2021   Brain MRI 08/25/21: Few small remote lacunar infarcts noted at the right caudate and left internal capsule   Legally blind    Major depressive disorder    Migraines    Myalgia 04/23/2019   OSA (obstructive sleep apnea) 07/18/2018   not using CPAP machine (broken)   Osteoporosis 01/18/2021   Paroxysmal atrial tachycardia    On telemetry   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   Type 2 diabetes mellitus with diabetic polyneuropathy, with long-term current use of insulin 07/18/2018   has dexcom G7  - sensor left upper arm    Past Surgical History:  Procedure Laterality Date   ABDOMINAL HYSTERECTOMY  2012   APPENDECTOMY  1972   CEREBRAL ANEURYSM REPAIR Left 2004   Aneurysm clip in place near the level of the left ICA on Brain MRI 08/2020. Also coiling performed   CHOLECYSTECTOMY  2015   COLONOSCOPY  11/24/2020   in [facility]   CRANIOTOMY Left    left pterional craniotomy   MENISCUS REPAIR Bilateral    UPPER  GI ENDOSCOPY  11/24/2020   in [facility]    Family History  Problem Relation Age of Onset   Alcohol abuse Mother    Diabetes Mother    Hypertension Mother    Kidney disease Mother    COPD Father    Diabetes Father    Early death Father    Heart disease Father    Hyperlipidemia Father    Hypertension Father    Diabetes Sister    Hypertension Sister    Breast cancer Sister    Diabetes Sister    Hypertension Sister    Kidney disease Sister    Cancer Maternal Grandmother    Alcohol abuse Maternal Grandfather    Cancer Maternal Grandfather    Cancer  Paternal Grandmother    Cancer Paternal Grandfather     Social History:  reports that she quit smoking about 55 years ago. Her smoking use included cigarettes. She has Cassandra 3.00 pack-year smoking history. She has been exposed to tobacco smoke. She has never used smokeless tobacco. She reports that she does not currently use drugs. She reports that she does not drink alcohol.  Allergies: No Known Allergies  Medications Prior to Admission  Medication Sig Dispense Refill   Ascorbic Acid (VITAMIN C ADULT GUMMIES PO) Take 1 each by mouth daily in the afternoon.     dapagliflozin propanediol (FARXIGA) 10 MG TABS tablet Take 1 tablet (10 mg total) by mouth daily. 90 tablet 3   DULoxetine (CYMBALTA) 60 MG capsule Take 1 capsule (60 mg total) by mouth daily. (Patient taking differently: Take 60 mg by mouth daily in the afternoon.) 90 capsule 1   ezetimibe (ZETIA) 10 MG tablet Take 1 tablet (10 mg total) by mouth daily. 90 tablet 3   FIBER SELECT GUMMIES PO Take 1 tablet by mouth daily in the afternoon.     fluticasone (FLONASE) 50 MCG/ACT nasal spray Place 1 spray into both nostrils daily as needed for allergies or rhinitis. 9.9 mL 1   furosemide (LASIX) 40 MG tablet Take 1 tablet (40 mg total) by mouth as needed for edema. (Patient taking differently: Take 40 mg by mouth daily as needed (ankle swelling.).) 90 tablet 3   gabapentin (NEURONTIN) 600 MG tablet TAKE 0.5-1 TABLET (300-600 MG TOTAL) BY MOUTH 3 (THREE) TIMES DAILY. (Patient taking differently: Take 600 mg by mouth 2 (two) times daily.) 270 tablet 3   insulin aspart (FIASP) 100 UNIT/ML FlexTouch Pen Inject 15 Units into the skin daily with breakfast AND 15 Units daily with lunch AND 12 Units daily with supper. This is Cassandra patient assistance medication. Patient may not be approved and/or have medication. Please ask and verify when performing med review.. 15 mL 0   insulin degludec (TRESIBA) 100 UNIT/ML FlexTouch Pen Inject 40 Units into the skin daily.  This is Cassandra patient assistance medication. Patient may not be approved and/or have medication. Please ask and verify when performing med review. (Patient taking differently: Inject 44 Units into the skin daily in the afternoon. This is Cassandra patient assistance medication. Patient may not be approved and/or have medication. Please ask and verify when performing med review.  Patient takes this medication in the afternoon.)     lisinopril (ZESTRIL) 40 MG tablet Take 1 tablet (40 mg total) by mouth daily. (Patient taking differently: Take 20 mg by mouth in the morning.) 90 tablet 3   loratadine (CLARITIN) 10 MG tablet TAKE 1 TABLET EVERY DAY 90 tablet 3   LORazepam (ATIVAN) 0.5 MG  tablet Take 1 tablet (0.5 mg total) by mouth every 8 (eight) hours as needed for anxiety. Happy holidays! 90 tablet 0   Magnesium 500 MG TABS Take 500 mg by mouth daily in the afternoon.     Multiple Vitamin (MULTIVITAMIN WITH MINERALS) TABS tablet Take 1 tablet by mouth in the morning.     Oyster Shell (OYSTER CALCIUM) 500 MG TABS tablet Take 500 mg of elemental calcium by mouth daily in the afternoon.     pantoprazole (PROTONIX) 40 MG tablet Take 1 tablet (40 mg total) by mouth daily. 90 tablet 0   potassium chloride SA (KLOR-CON M20) 20 MEQ tablet Take 1 tablet (20 mEq total) by mouth as needed (TAKE WITH LASIX PRN EDEMA). (Patient taking differently: Take 20 mEq by mouth 2 (two) times daily.) 180 tablet 1   Semaglutide, 2 MG/DOSE, (OZEMPIC, 2 MG/DOSE,) 8 MG/3ML SOPN Inject 2 mg into the skin once Cassandra week. On Friday (Patient taking differently: Inject 2 mg into the skin every Sunday.)     sucralfate (CARAFATE) 1 g tablet TAKE 1 TABLET FOUR TIMES DAILY, WITH MEALS AND AT BEDTIME (OKAY TO TRIAL OFF AS INSTRUCTED) (Patient taking differently: Take 1 g by mouth in the morning and at bedtime.) 240 tablet 1   amLODipine (NORVASC) 5 MG tablet Take 1 tablet (5 mg total) by mouth daily. (Patient not taking: Reported on 07/16/2022) 30 tablet 9    atorvastatin (LIPITOR) 40 MG tablet TAKE 1 TABLET BY MOUTH EVERYDAY AT BEDTIME (Patient not taking: Reported on 11/21/2022) 90 tablet 3   baclofen (LIORESAL) 10 MG tablet TAKE 1 TABLET BY MOUTH TWICE Cassandra DAY AS NEEDED FOR MUSCLE SPASM (Patient not taking: Reported on 11/21/2022) 180 tablet 1   BD PEN NEEDLE NANO 2ND GEN 32G X 4 MM MISC USE WITH INSULIN 100 each 4   blood glucose meter kit and supplies Dispense based on patient and insurance preference. Use up to four times daily as directed. (FOR ICD-10 E10.9, E11.9). 1 each 0   Continuous Blood Gluc Receiver ([place]) DEVI Please use as directed to check blood sugar. 1 each 12   Continuous Blood Gluc Sensor (DEXCOM G6 SENSOR) MISC Use as directed to check blood sugar. 4 each 11   Continuous Blood Gluc Transmit (DEXCOM G6 TRANSMITTER) MISC Use as directed to check blood sugar. 1 each 12   dicyclomine (BENTYL) 20 MG tablet Take 1 tablet (20 mg total) by mouth 2 (two) times daily as needed for spasms. 20 tablet 0   famotidine (PEPCID) 20 MG tablet Take 1 tablet (20 mg total) by mouth daily. (Patient not taking: Reported on 11/21/2022) 90 tablet 3   glucose blood test strip Use as instructed 100 each 12   metFORMIN (GLUCOPHAGE-XR) 750 MG 24 hr tablet TAKE 1 TABLET (750 MG TOTAL) BY MOUTH 2 (TWO) TIMES DAILY WITH Cassandra MEAL. (Patient not taking: Reported on 11/21/2022) 180 tablet 3   OneTouch Delica Lancets 17P MISC Please use to check blood sugar up to 4 times daily. E11.42 100 each 12   traZODone (DESYREL) 50 MG tablet TAKE 1/2 TABLET BY MOUTH AT BEDTIME AS NEEDED FOR SLEEP. (Patient not taking: Reported on 11/21/2022) 45 tablet 1    Results for orders placed or performed during the hospital encounter of 11/23/22 (from the past 48 hour(s))  Glucose, capillary     Status: Abnormal   Collection Time: 11/23/22  6:16 AM  Result Value Ref Range   Glucose-Capillary 153 (H) 70 -  99 mg/dL    Comment: Glucose reference range applies only to samples taken  after fasting for at least 8 hours.  Comprehensive metabolic panel per protocol     Status: Abnormal   Collection Time: 11/23/22  6:25 AM  Result Value Ref Range   Sodium 136 135 - 145 mmol/L   Potassium 3.6 3.5 - 5.1 mmol/L   Chloride 104 98 - 111 mmol/L   CO2 23 22 - 32 mmol/L   Glucose, Bld 147 (H) 70 - 99 mg/dL    Comment: Glucose reference range applies only to samples taken after fasting for at least 8 hours.   BUN 13 8 - 23 mg/dL   Creatinine, Ser 8.37 0.44 - 1.00 mg/dL   Calcium 9.5 8.9 - 29.0 mg/dL   Total Protein 7.0 6.5 - 8.1 g/dL   Albumin 4.1 3.5 - 5.0 g/dL   AST 32 15 - 41 U/L   ALT 49 (H) 0 - 44 U/L   Alkaline Phosphatase 92 38 - 126 U/L   Total Bilirubin 0.7 0.3 - 1.2 mg/dL   GFR, Estimated >21 >11 mL/min    Comment: (NOTE) Calculated using the CKD-EPI Creatinine Equation (2021)    Anion gap 9 5 - 15    Comment: Performed at [facility], [address] [place], [place] [address]  CBC per protocol     Status: Abnormal   Collection Time: 11/23/22  6:25 AM  Result Value Ref Range   WBC 8.2 4.0 - 10.5 K/uL   RBC 5.72 (H) 3.87 - 5.11 MIL/uL   Hemoglobin 17.0 (H) 12.0 - 15.0 g/dL   HCT 02.2 (H) 33.6 - 12.2 %   MCV 86.4 80.0 - 100.0 fL   MCH 29.7 26.0 - 34.0 pg   MCHC 34.4 30.0 - 36.0 g/dL   RDW 44.9 75.3 - 00.5 %   Platelets 182 150 - 400 K/uL   nRBC 0.0 0.0 - 0.2 %    Comment: Performed at [facility], [address], [place], [place] [address]   No results found.  ROS: ROS  Blood pressure (!) 187/79, pulse 72, temperature 97.9 F (36.6 C), temperature source Oral, resp. rate 17, height 5\' 1"  (1.549 m), weight 81.6 kg, SpO2 100 %.  PHYSICAL EXAM: Physical Exam Constitutional:      Appearance: Normal appearance.  HENT:     Right Ear: External ear normal.     Left Ear: External ear normal.     Mouth/Throat:     Mouth: Mucous membranes are moist.  Pulmonary:     Effort: Pulmonary effort is normal.  Neurological:     General: No focal  deficit present.     Mental Status: She is alert.     Studies Reviewed: CT Sinus reviewed   Assessment/Plan Cassandra Reid is Cassandra 67 y.o. female with PMH of chronic kidney disease, diabetes, hypertension, fibromyalgia, history of cerebral aneurysm s/p coiling about 14 years ago with incidental finding of left maxillary retention cyst on MRI of the brain performed in October 2021. She had CT sinus performed in 2022 which demonstrated persistent large left maxillary retention cyst without obstruction of the middle meatus.  -To OR today for functional endoscopic sinus surgery with left maxillary antrostomy, left anterior ethmoidectomy and bilateral turbinate reduction with image guidance. Risks of surgery were reviewed, and patient expressed understanding and agreement. As patient arrived via 2023 and has no one to stay with her overnight, she will be admitted postop for  observation.     Cassandra Reid Cassandra Reid 11/23/2022, 7:28 AM

## 2022-11-23 NOTE — Anesthesia Procedure Notes (Signed)
Procedure Name: Intubation Date/Time: 11/23/2022 9:52 AM  Performed by: Inda Coke, CRNAPre-anesthesia Checklist: Patient identified, Emergency Drugs available, Suction available, Timeout performed and Patient being monitored Patient Re-evaluated:Patient Re-evaluated prior to induction Oxygen Delivery Method: Circle system utilized Preoxygenation: Pre-oxygenation with 100% oxygen Induction Type: IV induction Ventilation: Mask ventilation without difficulty Laryngoscope Size: Mac and 3 Grade View: Grade I Tube type: Oral Tube size: 7.0 mm Number of attempts: 1 Airway Equipment and Method: Stylet Placement Confirmation: ETT inserted through vocal cords under direct vision, positive ETCO2, CO2 detector and breath sounds checked- equal and bilateral Secured at: 21 cm Tube secured with: Tape Dental Injury: Teeth and Oropharynx as per pre-operative assessment

## 2022-11-24 ENCOUNTER — Encounter (HOSPITAL_COMMUNITY): Payer: Self-pay | Admitting: Otolaryngology

## 2022-11-24 DIAGNOSIS — R0981 Nasal congestion: Secondary | ICD-10-CM | POA: Diagnosis not present

## 2022-11-24 DIAGNOSIS — J45909 Unspecified asthma, uncomplicated: Secondary | ICD-10-CM | POA: Diagnosis not present

## 2022-11-24 DIAGNOSIS — Z87891 Personal history of nicotine dependence: Secondary | ICD-10-CM | POA: Diagnosis not present

## 2022-11-24 DIAGNOSIS — J341 Cyst and mucocele of nose and nasal sinus: Secondary | ICD-10-CM | POA: Diagnosis not present

## 2022-11-24 DIAGNOSIS — I129 Hypertensive chronic kidney disease with stage 1 through stage 4 chronic kidney disease, or unspecified chronic kidney disease: Secondary | ICD-10-CM | POA: Diagnosis not present

## 2022-11-24 DIAGNOSIS — E1122 Type 2 diabetes mellitus with diabetic chronic kidney disease: Secondary | ICD-10-CM | POA: Diagnosis not present

## 2022-11-24 DIAGNOSIS — J309 Allergic rhinitis, unspecified: Secondary | ICD-10-CM | POA: Diagnosis not present

## 2022-11-24 DIAGNOSIS — N183 Chronic kidney disease, stage 3 unspecified: Secondary | ICD-10-CM | POA: Diagnosis not present

## 2022-11-24 DIAGNOSIS — J320 Chronic maxillary sinusitis: Secondary | ICD-10-CM | POA: Diagnosis not present

## 2022-11-24 LAB — GLUCOSE, CAPILLARY
Glucose-Capillary: 198 mg/dL — ABNORMAL HIGH (ref 70–99)
Glucose-Capillary: 167 mg/dL — ABNORMAL HIGH (ref 70–99)

## 2022-11-24 NOTE — Discharge Summary (Signed)
Physician Discharge Summary  Patient ID: Cassandra Reid MRN: [id number] DOB/AGE: 1955/12/26 67 y.o.  Admit date: 11/23/2022 Discharge date: 11/24/2022  Admission Diagnoses: Chronic sinusitis  Discharge Diagnoses:  Principal Problem:   Chronic sinusitis Active Problems:   Chronic left maxillary sinusitis   Discharged Condition: good  Hospital Course: No complications  Consults: none  Significant Diagnostic Studies: none  Treatments: surgery: Endoscopic maxillary antrostomy  Discharge Exam: Blood pressure (!) 143/57, pulse 66, temperature 98.2 F (36.8 C), temperature source Axillary, resp. rate 16, height 5\' 1"  (1.549 m), weight 81.6 kg, SpO2 96 %. PHYSICAL EXAM: Awake and alert, breathing well, no bleeding.  Disposition: Discharge disposition: 01-Home or Self Care       Discharge Instructions     Diet - low sodium heart healthy   Complete by: As directed    Increase activity slowly   Complete by: As directed       Allergies as of 11/24/2022   No Known Allergies      Medication List     TAKE these medications    amLODipine 5 MG tablet Commonly known as: NORVASC Take 1 tablet (5 mg total) by mouth daily.   atorvastatin 40 MG tablet Commonly known as: LIPITOR TAKE 1 TABLET BY MOUTH EVERYDAY AT BEDTIME   baclofen 10 MG tablet Commonly known as: LIORESAL TAKE 1 TABLET BY MOUTH TWICE A DAY AS NEEDED FOR MUSCLE SPASM   BD Pen Needle Nano 2nd Gen 32G X 4 MM Misc Generic drug: Insulin Pen Needle USE WITH INSULIN   blood glucose meter kit and supplies Dispense based on patient and insurance preference. Use up to four times daily as directed. (FOR ICD-10 E10.9, E11.9).   cefadroxil 500 MG capsule Commonly known as: DURICEF Take 1 capsule (500 mg total) by mouth 2 (two) times daily for 7 days.   dapagliflozin propanediol 10 MG Tabs tablet Commonly known as: FARXIGA Take 1 tablet (10 mg total) by mouth daily.   Dexcom G6 Receiver Devi Please use as  directed to check blood sugar.   Dexcom G6 Sensor Misc Use as directed to check blood sugar.   Dexcom G6 Transmitter Misc Use as directed to check blood sugar.   dicyclomine 20 MG tablet Commonly known as: BENTYL Take 1 tablet (20 mg total) by mouth 2 (two) times daily as needed for spasms.   DULoxetine 60 MG capsule Commonly known as: Cymbalta Take 1 capsule (60 mg total) by mouth daily. What changed: when to take this   ezetimibe 10 MG tablet Commonly known as: Zetia Take 1 tablet (10 mg total) by mouth daily.   famotidine 20 MG tablet Commonly known as: PEPCID Take 1 tablet (20 mg total) by mouth daily.   FIBER SELECT GUMMIES PO Take 1 tablet by mouth daily in the afternoon.   fluticasone 50 MCG/ACT nasal spray Commonly known as: FLONASE Place 1 spray into both nostrils daily as needed for allergies or rhinitis.   furosemide 40 MG tablet Commonly known as: LASIX Take 1 tablet (40 mg total) by mouth as needed for edema. What changed:  when to take this reasons to take this   gabapentin 600 MG tablet Commonly known as: NEURONTIN TAKE 0.5-1 TABLET (300-600 MG TOTAL) BY MOUTH 3 (THREE) TIMES DAILY. What changed:  how much to take how to take this when to take this additional instructions   glucose blood test strip Use as instructed   HYDROcodone-acetaminophen 5-325 MG tablet Commonly known as: NORCO/VICODIN Take 1 tablet  by mouth every 6 (six) hours as needed for up to 3 days for moderate pain or severe pain.   insulin aspart 100 UNIT/ML FlexTouch Pen Commonly known as: FIASP Inject 15 Units into the skin daily with breakfast AND 15 Units daily with lunch AND 12 Units daily with supper. This is a patient assistance medication. Patient may not be approved and/or have medication. Please ask and verify when performing med review..   insulin degludec 100 UNIT/ML FlexTouch Pen Commonly known as: TRESIBA Inject 40 Units into the skin daily. This is a patient  assistance medication. Patient may not be approved and/or have medication. Please ask and verify when performing med review. What changed:  how much to take when to take this additional instructions   lisinopril 40 MG tablet Commonly known as: ZESTRIL Take 1 tablet (40 mg total) by mouth daily. What changed:  how much to take when to take this   loratadine 10 MG tablet Commonly known as: CLARITIN TAKE 1 TABLET EVERY DAY   LORazepam 0.5 MG tablet Commonly known as: ATIVAN Take 1 tablet (0.5 mg total) by mouth every 8 (eight) hours as needed for anxiety. Happy holidays!   Magnesium 500 MG Tabs Take 500 mg by mouth daily in the afternoon.   metFORMIN 750 MG 24 hr tablet Commonly known as: GLUCOPHAGE-XR TAKE 1 TABLET (750 MG TOTAL) BY MOUTH 2 (TWO) TIMES DAILY WITH A MEAL.   multivitamin with minerals Tabs tablet Take 1 tablet by mouth in the morning.   OneTouch Delica Lancets 40X Misc Please use to check blood sugar up to 4 times daily. E11.42   oyster calcium 500 MG Tabs tablet Take 500 mg of elemental calcium by mouth daily in the afternoon.   Ozempic (2 MG/DOSE) 8 MG/3ML Sopn Generic drug: Semaglutide (2 MG/DOSE) Inject 2 mg into the skin once a week. On Friday What changed:  when to take this additional instructions   pantoprazole 40 MG tablet Commonly known as: PROTONIX Take 1 tablet (40 mg total) by mouth daily.   potassium chloride SA 20 MEQ tablet Commonly known as: Klor-Con M20 Take 1 tablet (20 mEq total) by mouth as needed (TAKE WITH LASIX PRN EDEMA). What changed: when to take this   predniSONE 10 MG tablet Commonly known as: DELTASONE Take 4 tablets (40 mg total) by mouth daily for 2 days, THEN 3 tablets (30 mg total) daily for 1 day, THEN 2 tablets (20 mg total) daily for 1 day, THEN 1 tablet (10 mg total) daily for 1 day. Start taking on: November 24, 2022   sucralfate 1 g tablet Commonly known as: CARAFATE TAKE 1 TABLET FOUR TIMES DAILY, WITH  MEALS AND AT BEDTIME (OKAY TO TRIAL OFF AS INSTRUCTED) What changed: See the new instructions.   traZODone 50 MG tablet Commonly known as: DESYREL TAKE 1/2 TABLET BY MOUTH AT BEDTIME AS NEEDED FOR SLEEP.   VITAMIN C ADULT GUMMIES PO Take 1 each by mouth daily in the afternoon.        Follow-up Information     Skotnicki, Meghan A, DO. Go on 11/29/2022.   Specialty: Otolaryngology Why: Follow up as scheduled on 11/29/2021 Contact information: [address] [place] [address] [phone]                 Signed: Izora Gala 11/24/2022, 8:09 AM

## 2022-11-26 LAB — SURGICAL PATHOLOGY

## 2022-12-06 DIAGNOSIS — R0981 Nasal congestion: Secondary | ICD-10-CM | POA: Diagnosis not present

## 2022-12-06 DIAGNOSIS — J341 Cyst and mucocele of nose and nasal sinus: Secondary | ICD-10-CM | POA: Diagnosis not present

## 2022-12-07 ENCOUNTER — Ambulatory Visit (INDEPENDENT_AMBULATORY_CARE_PROVIDER_SITE_OTHER): Payer: Medicare PPO | Admitting: Family Medicine

## 2022-12-07 ENCOUNTER — Encounter: Payer: Self-pay | Admitting: Family Medicine

## 2022-12-07 VITALS — BP 132/80 | HR 105 | Ht 65.5 in | Wt 179.6 lb

## 2022-12-07 DIAGNOSIS — E1142 Type 2 diabetes mellitus with diabetic polyneuropathy: Secondary | ICD-10-CM

## 2022-12-07 DIAGNOSIS — M26609 Unspecified temporomandibular joint disorder, unspecified side: Secondary | ICD-10-CM | POA: Diagnosis not present

## 2022-12-07 DIAGNOSIS — Z794 Long term (current) use of insulin: Secondary | ICD-10-CM | POA: Diagnosis not present

## 2022-12-07 MED ORDER — MOUNJARO 2.5 MG/0.5ML ~~LOC~~ SOAJ: 2.5000 mg | SUBCUTANEOUS | 0 refills | Status: DC

## 2022-12-07 NOTE — Patient Instructions (Signed)
It was wonderful to see you today. Thank you for allowing me to be a part of your care. Below is a short summary of what we discussed at your visit today:  Diabetes Increase your daily insulin Tyler Aas) from 40 units daily to 46 units daily. If your blood sugars start to get closer to the 120 mark, decrease your insulin by 2 units/day until you are back at 40 units daily. Call me if you experience low blood sugars.  Stop Ozempic. Start Spurgeon on your next once weekly injection day. Follow-up either virtual or in person in 3 to 4 weeks to assess how you are tolerating the Mounjaro.  TMJ Lets start with some exercises you can do, and use things like Tylenol.  We cannot use any NSAIDs in you, but we can use things like Tylenol.  See the handout provided for more exercises.  Come back if you do not have improvement.   Please bring all of your medications to every appointment!  If you have any questions or concerns, please do not hesitate to contact us via phone or MyChart message.   Ezequiel Essex, MD

## 2022-12-07 NOTE — Progress Notes (Signed)
SUBJECTIVE:   CHIEF COMPLAINT / HPI:   Elevated glucose Recently underwent ENT surgery for sinus issues. Subsequently she has been on antibiotics and nasal spray. ENT tried to place her steroids, but she became ill after one dose so stopped taking them. Since surgery, she has noticed elevated glucose levels on her CGM and finger sticks. Blood glucose can be as high as 250 or 300, even when she has not eaten. Morning sugars can be this high, but her measurements improve throughout the day - her evening sugars are usually the best. Currently on tresiba 40 units daily, fiasp 15/15/12 at mealtime.   Lab Results  Component Value Date   HGBA1C 7.1 (H) 11/23/2022   HGBA1C 6.8 09/21/2022   HGBA1C 7.6 (H) 05/21/2022   Lab Results  Component Value Date   MICROALBUR 150 11/10/2020   LDLCALC 92 06/26/2022   CREATININE 0.96 11/23/2022   Right ear and jaw pain After surgery, began to notice right ear and jaw discomfort and clicking. Some tenderness to touch and her right cheek appears more erythematous than the left. No overlying rash or other lesions.   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   Chronic left maxillary sinusitis 11/23/2022   Chronic sinusitis 11/23/2022   Orthostatic hypotension 07/10/2022   Myofascial pain dysfunction syndrome 04/13/2022   Acute pain of left shoulder 04/03/2022   Paroxysmal atrial tachycardia 11/24/2021   Cerebral aneurysm without rupture    Major depressive disorder    Generalized anxiety disorder    Balance problems 09/22/2021   Incontinence 09/22/2021   Obesity (BMI 30.0-34.9) 05/25/2021   Lacunar infarction 05/25/2021   Anosmia    Osteoporosis 01/18/2021   Encephalomalacia on imaging study 08/25/2020   Fatty liver 05/31/2020   Chronic pain syndrome 04/23/2019   DDD (degenerative disc disease), cervical 04/16/2019   CKD stage 3 due to type 2 diabetes mellitus 04/15/2019    Hypertension associated with diabetes 07/18/2018   GERD (gastroesophageal reflux disease) 07/18/2018   Complex sleep apnea syndrome 07/18/2018   Type 2 diabetes mellitus with diabetic polyneuropathy, with long-term current use of insulin 07/18/2018   HLD, secondary prevention, LDL goal <70 07/18/2018   Fibromyalgia    Legally blind 09/01/2003    OBJECTIVE:   BP 132/80   Pulse (!) 105   Ht 5' 5.5" (1.664 m)   Wt 179 lb 9.6 oz (81.5 kg)   SpO2 92%   BMI 29.43 kg/m    PHQ-9:     12/07/2022   11:36 AM 09/21/2022    3:52 PM 04/13/2022    2:55 PM  Depression screen PHQ 2/9  Decreased Interest 2 3 3   Down, Depressed, Hopeless 2 2 3   PHQ - 2 Score 4 5 6   Altered sleeping 3 2 1   Tired, decreased energy 3 2 1   Change in appetite 0 1 0  Feeling bad or failure about yourself  2 3 3   Trouble concentrating 2 1 0  Moving slowly or fidgety/restless 0 1 0  Suicidal thoughts 0 0 0  PHQ-9 Score 14 15 11   Difficult doing work/chores Very difficult Very difficult     GAD-7:     06/05/2021    1:40 PM 09/08/2020    7:36 AM 08/11/2020    8:37 AM 05/26/2020    9:59 AM  GAD 7 : Generalized Anxiety Score  Nervous, Anxious, on Edge 3 3 3 3   Control/stop  worrying 3 3 3 3   Worry too much - different things 3 3 3 3   Trouble relaxing 3 3 3 3   Restless 0 3  1  Easily annoyed or irritable 2 0 0 1  Afraid - awful might happen 3 3 3 1   Total GAD 7 Score 17 18  15   Anxiety Difficulty  Very difficult  Somewhat difficult   Physical Exam General: Awake, alert, oriented HEENT: BL TM pearly pink, nares unremarkable, BL auditory canals clear and without lesion, oral mucosa moist and without lesion, some TTP pre- and post-aurically on right, palpable and audible clicking of TM joint BL with opening of jaw Neck: no palpable lymphadenopathy, no TTP over right lateral neck Cardiovascular: Regular rate and rhythm, S1 and S2 present, no murmurs auscultated Respiratory: Lung fields clear to auscultation  bilaterally  ASSESSMENT/PLAN:   Type 2 diabetes mellitus with diabetic polyneuropathy, with long-term current use of insulin A1c today only slightly increased from prior, 6.8>7.1. Wonder if elevated sugars sequelae from physiologic stress of surgery? Will increase long acting insulin from 40 units to 46 daily. Hypoglycemia precautions discussed. Plan to taper back down as sugars are better controlled. Already at max dose Ozempic, will trial switch to [facility] for more dosing options for glucose control.   TMJ dysfunction TM joint tenderness to palpation and palpable and audible clicking on exam. Cannot use NSAIDs in this patient. Provided TMJ rehab and strengthening exercises. Can do tylenol PRN. If no relief, could consider injections.      Ezequiel Essex, MD [place]

## 2022-12-10 ENCOUNTER — Encounter: Payer: Self-pay | Admitting: Family Medicine

## 2022-12-10 ENCOUNTER — Telehealth: Payer: Self-pay

## 2022-12-10 ENCOUNTER — Other Ambulatory Visit: Payer: Self-pay | Admitting: Family Medicine

## 2022-12-10 ENCOUNTER — Other Ambulatory Visit: Payer: Self-pay | Admitting: Nurse Practitioner

## 2022-12-10 DIAGNOSIS — E876 Hypokalemia: Secondary | ICD-10-CM

## 2022-12-10 DIAGNOSIS — M26609 Unspecified temporomandibular joint disorder, unspecified side: Secondary | ICD-10-CM | POA: Insufficient documentation

## 2022-12-10 DIAGNOSIS — E1142 Type 2 diabetes mellitus with diabetic polyneuropathy: Secondary | ICD-10-CM

## 2022-12-10 DIAGNOSIS — F32A Depression, unspecified: Secondary | ICD-10-CM

## 2022-12-10 MED ORDER — DULOXETINE HCL 60 MG PO CPEP: 60.0000 mg | ORAL_CAPSULE | Freq: Every day | ORAL | 3 refills | Status: DC

## 2022-12-10 MED ORDER — OZEMPIC (2 MG/DOSE) 8 MG/3ML ~~LOC~~ SOPN: 2.0000 mg | PEN_INJECTOR | SUBCUTANEOUS | 3 refills | Status: DC

## 2022-12-10 NOTE — Addendum Note (Signed)
Addended by: Renard Hamper on: 12/10/2022 06:48 PM   Modules accepted: Orders

## 2022-12-10 NOTE — Assessment & Plan Note (Signed)
TM joint tenderness to palpation and palpable and audible clicking on exam. Cannot use NSAIDs in this patient. Provided TMJ rehab and strengthening exercises. Can do tylenol PRN. If no relief, could consider injections.

## 2022-12-10 NOTE — Telephone Encounter (Signed)
Received notification from [employer] regarding RE-ENROLLMENT approval for [place], [place], & FIASP. Patient assistance approved from 12/06/22 to 11/19/23.  ALL MEDICATIONS WILL SHIP TO OFFICE. PLEASE ALLOW 4-6 WEEKS FOR SHIPMENT.  Phone: [phone]

## 2022-12-10 NOTE — Assessment & Plan Note (Signed)
A1c today only slightly increased from prior, 6.8>7.1. Wonder if elevated sugars sequelae from physiologic stress of surgery? Will increase long acting insulin from 40 units to 46 daily. Hypoglycemia precautions discussed. Plan to taper back down as sugars are better controlled. Already at max dose Ozempic, will trial switch to [facility] for more dosing options for glucose control.

## 2022-12-12 DIAGNOSIS — E1142 Type 2 diabetes mellitus with diabetic polyneuropathy: Secondary | ICD-10-CM | POA: Diagnosis not present

## 2022-12-12 DIAGNOSIS — M5481 Occipital neuralgia: Secondary | ICD-10-CM | POA: Diagnosis not present

## 2022-12-13 DIAGNOSIS — R0981 Nasal congestion: Secondary | ICD-10-CM | POA: Diagnosis not present

## 2022-12-13 DIAGNOSIS — J341 Cyst and mucocele of nose and nasal sinus: Secondary | ICD-10-CM | POA: Diagnosis not present

## 2022-12-25 DIAGNOSIS — M5481 Occipital neuralgia: Secondary | ICD-10-CM | POA: Diagnosis not present

## 2022-12-26 NOTE — Telephone Encounter (Signed)
Patient presents to clinic for medication shipment. Provided per note from [place].   Talbot Grumbling, RN

## 2022-12-28 ENCOUNTER — Telehealth: Payer: Self-pay | Admitting: Family Medicine

## 2022-12-28 DIAGNOSIS — Z794 Long term (current) use of insulin: Secondary | ICD-10-CM

## 2022-12-28 NOTE — Telephone Encounter (Signed)
Patient called asking for a virtual visit stating she has been doing really good with her sugar levels and is still doing the same thing, but the last 3 weeks are levels will shoot up to around 300. She stated she is wanting to do a virtual visit because she don't think it needs to be hands on, she thinks it is the medicine. She was requesting it be with either Dr. Valentina Lucks or Dr. Jeani Hawking so I am sending it to both of you guys to see if yall think it is appropriate.   Please Advise.   Thanks!

## 2022-12-31 ENCOUNTER — Encounter: Payer: Self-pay | Admitting: Family Medicine

## 2023-01-04 MED ORDER — INSULIN ASPART (W/NIACINAMIDE) 100 UNIT/ML ~~LOC~~ SOPN: PEN_INJECTOR | SUBCUTANEOUS | 0 refills | Status: DC

## 2023-01-04 NOTE — Telephone Encounter (Signed)
Phone call to patient  Discussed blood sugar elevations post sinus surgery 11/24/2022  She reports no pain in her surgery site but reports some congestion that is variable in intensity over the last 4 weeks.   She reports her blood sugars are elevated and is concerned about her readings.   [employer] CGM - results.   Date of Download: 01/04/2023 % Time CGM is active: 99.5% Average Glucose: 195 mg/dL Glucose Management Indicator: 8.0  Glucose Variability: 25.4% (goal <36%) Time in Goal:  - Time in range 70-180: 41% - Time above range: 59% with 12% higher than 267m/dl - Time below range: 0% Observed patterns:  Current regimen per patient: Ozempic (semaglutide) 258mweekly Tresiba (insulin degludec) 46 units daily Fiasp (insulin aspart) 15 - 15- 12 units.   Following discussion, patient agreed to ADD 2 units of Fiasp (bolus) to her current doses IF her pre-meal blood sugar was > 18074ml.   She verbalized understanding of treatment plan.   She also agreed to call for an appointment to be seen for her continued sinus congestion.   Discussed plan with PCP, Dr. LynJeani HawkingD

## 2023-01-04 NOTE — Telephone Encounter (Signed)
Reviewed and agree with Dr Graylin Shiver plan.

## 2023-01-07 ENCOUNTER — Ambulatory Visit (INDEPENDENT_AMBULATORY_CARE_PROVIDER_SITE_OTHER): Payer: Medicare PPO | Admitting: Student

## 2023-01-07 ENCOUNTER — Encounter: Payer: Self-pay | Admitting: Cardiology

## 2023-01-07 ENCOUNTER — Encounter: Payer: Self-pay | Admitting: Student

## 2023-01-07 ENCOUNTER — Ambulatory Visit (HOSPITAL_COMMUNITY)
Admission: RE | Admit: 2023-01-07 | Discharge: 2023-01-07 | Disposition: A | Discharge status: Home / Self Care | Payer: Medicare PPO | Source: Ambulatory Visit | Attending: Family Medicine | Admitting: Family Medicine

## 2023-01-07 VITALS — BP 128/52 | HR 80 | Ht 61.0 in | Wt 180.6 lb

## 2023-01-07 DIAGNOSIS — R5383 Other fatigue: Secondary | ICD-10-CM | POA: Diagnosis not present

## 2023-01-07 DIAGNOSIS — R079 Chest pain, unspecified: Secondary | ICD-10-CM | POA: Insufficient documentation

## 2023-01-07 LAB — POCT HEMOGLOBIN: Hemoglobin: 16.7 g/dL — AB (ref 11–14.6)

## 2023-01-07 LAB — GLUCOSE, POCT (MANUAL RESULT ENTRY): POC Glucose: 118 mg/dl — AB (ref 70–99)

## 2023-01-07 NOTE — Assessment & Plan Note (Signed)
No abnormalities in TSH or last BMP. POCT hgb to assess for anemia and CBG to assess for hypoglycemia.

## 2023-01-07 NOTE — Progress Notes (Signed)
  SUBJECTIVE:   CHIEF COMPLAINT / HPI:   Chest Pain  With fatigue onset 1 week ago Chest pinching sensation; onset 1 week ago, not occurring with exercise but intermittent at random times  Feels somewhat short of breath constantly Cardiac history: paroxysmal atrial tachycardia--follows with cardiology  No illnesses lately Had surgery in 11/2022 >4 weeks ago, occipital nerve blocks Anxiety: Lorazepam 0.5, last dose on Friday.  No changes in the medications   PERTINENT  PMH / PSH: Cerebral aneurysm, HTN, paroxysmal atrial tachycardia, chronic left maxillary sinusitis, Sleep apnea, GERD, fatty liver, CKD, DM2, legally blind, obesity, depression, GAD, Fibromyalgia, chronic pain  OBJECTIVE:  BP (!) 128/52   Pulse 80   Ht 5' 1"$  (1.549 m)   Wt 180 lb 9.6 oz (81.9 kg)   SpO2 99%   BMI 34.12 kg/m  Physical Exam  General: Alert and oriented in no apparent distress Heart: Regular rate and rhythm with no murmurs appreciated Lungs: CTA bilaterally, no wheezing Abdomen: Bowel sounds present, no abdominal pain Skin: Warm and dry Extremities: No lower extremity edema   ASSESSMENT/PLAN:  Chest pain, unspecified type Assessment & Plan: Perc 1 for age. HEART score 4. Low risk for PE, VSS. Moderate HEART score for age and comorbidities, right sided chest pain while hemodynamically intact reassuring. EKG NSR with low voltage, no acute ST segment changes. Possibly related to anxiety. Last echo 6 mo ago overall unremarkable. TSH nml 6 mo. CBG to assess metabolic cause, Hgb to assess for anemia. Follow up with cardiologist, who she is established with. No need for UTD echo given recent normal exam. Strict return precautions provided.   Orders: -     EKG 12-Lead -     POCT glucose (manual entry) -     POCT hemoglobin  Other fatigue Assessment & Plan: No abnormalities in TSH or last BMP. POCT hgb to assess for anemia and CBG to assess for hypoglycemia.   Orders: -     POCT glucose (manual  entry) -     POCT hemoglobin  Other orders -     EKG 12-Lead      Return in about 3 weeks (around 01/28/2023) for chest pain. Erskine Emery, MD 01/07/2023, 1:00 PM PGY-2, [place]

## 2023-01-07 NOTE — Patient Instructions (Addendum)
It was great to see you today! Thank you for choosing [facility] for your primary care. Cassandra Reid was seen for sick visit.  Today we addressed: We will check some labs today  EKG is normal  See your cardiologist ASAP for this pain, [place] [facility] Please return to healthcare provider if you experience worsening shortness of breath or chest pain, leg swelling  If you haven't already, sign up for My Chart to have easy access to your labs results, and communication with your primary care physician.  I recommend that you always bring your medications to each appointment as this makes it easy to ensure you are on the correct medications and helps [place] not miss refills when you need them. Call the clinic at [phone] if your symptoms worsen or you have any concerns.  You should return to our clinic Return in about 3 weeks (around 01/28/2023) for chest pain. Please arrive 15 minutes before your appointment to ensure smooth check in process.  We appreciate your efforts in making this happen.  Thank you for allowing me to participate in your care, Erskine Emery, MD 01/07/2023, 11:33 AM PGY-2, [place]

## 2023-01-07 NOTE — Assessment & Plan Note (Addendum)
Perc 1 for age. HEART score 4. Low risk for PE, VSS. Moderate HEART score for age and comorbidities, right sided chest pain while hemodynamically intact reassuring. EKG NSR with low voltage, no acute ST segment changes. Possibly related to anxiety. Last echo 6 mo ago overall unremarkable. TSH nml 6 mo. CBG to assess metabolic cause, Hgb to assess for anemia. Follow up with cardiologist, who she is established with. No need for UTD echo given recent normal exam. Strict return precautions provided.

## 2023-01-11 ENCOUNTER — Telehealth: Payer: Self-pay | Admitting: Licensed Clinical Social Worker

## 2023-01-11 ENCOUNTER — Telehealth (HOSPITAL_BASED_OUTPATIENT_CLINIC_OR_DEPARTMENT_OTHER): Payer: Self-pay | Admitting: Family

## 2023-01-11 ENCOUNTER — Ambulatory Visit (HOSPITAL_BASED_OUTPATIENT_CLINIC_OR_DEPARTMENT_OTHER): Payer: Medicare PPO | Admitting: Family

## 2023-01-11 ENCOUNTER — Encounter (HOSPITAL_BASED_OUTPATIENT_CLINIC_OR_DEPARTMENT_OTHER): Payer: Self-pay | Admitting: Family

## 2023-01-11 ENCOUNTER — Telehealth: Payer: Self-pay

## 2023-01-11 VITALS — BP 150/60 | HR 81 | Ht 61.0 in | Wt 182.0 lb

## 2023-01-11 DIAGNOSIS — I10 Essential (primary) hypertension: Secondary | ICD-10-CM | POA: Diagnosis not present

## 2023-01-11 DIAGNOSIS — R072 Precordial pain: Secondary | ICD-10-CM | POA: Diagnosis not present

## 2023-01-11 DIAGNOSIS — E782 Mixed hyperlipidemia: Secondary | ICD-10-CM | POA: Diagnosis not present

## 2023-01-11 DIAGNOSIS — H548 Legal blindness, as defined in USA: Secondary | ICD-10-CM

## 2023-01-11 DIAGNOSIS — Q245 Malformation of coronary vessels: Secondary | ICD-10-CM

## 2023-01-11 DIAGNOSIS — N1831 Chronic kidney disease, stage 3a: Secondary | ICD-10-CM

## 2023-01-11 DIAGNOSIS — R002 Palpitations: Secondary | ICD-10-CM

## 2023-01-11 MED ORDER — METOPROLOL SUCCINATE ER 25 MG PO TB24: 25.0000 mg | ORAL_TABLET | Freq: Every day | ORAL | 1 refills | Status: DC

## 2023-01-11 MED ORDER — METOPROLOL TARTRATE 100 MG PO TABS: ORAL_TABLET | ORAL | 0 refills | Status: DC

## 2023-01-11 NOTE — Telephone Encounter (Signed)
   Telephone encounter was:  Successful.  01/11/2023 Name: [name] MRN: [id number] DOB: [birth year]  Cassandra Reid is a 67 y.o. year old female who is a primary care patient of Ezequiel Essex, MD . The community resource team was consulted for assistance with Transportation Needs   Care guide performed the following interventions: Patient provided with information about care guide support team and interviewed to confirm resource needs.Patient need transportation resources mailed to her. No transportation needs at this time   Follow Up Plan:  No further follow up planned at this time. The patient has been provided with needed resources.    [name] [phone] [address], [place], [place] [address] Phone: [phone] Email: [email or web address]

## 2023-01-11 NOTE — Telephone Encounter (Signed)
H&V Care Navigation CSW Progress Note  LCSW familiar with pt from previous assessment in 2022- at that time pt was using [employer] and [employer]. [facility] no longer available, I have f/u with team and they share that they think she has been ubering to appts. Since she is a [facility] patient I will refer to [place] for transportation. We remain available as needed.   Patient is participating in a Managed Medicaid Plan:  No,   [place]: No Food Insecurity (11/23/2022)  Housing: Low Risk  (11/23/2022)  Transportation Needs: No Transportation Needs (11/23/2022)  Utilities: Not At Risk (11/23/2022)  Alcohol Screen: Low Risk  (03/19/2022)  Depression (PHQ2-9): High Risk (01/07/2023)  Financial Resource Strain: Low Risk  (03/19/2022)  Physical Activity: Sufficiently Active (03/19/2022)  Social Connections: Socially Isolated (03/19/2022)  Stress: Stress Concern Present (03/19/2022)  Tobacco Use: Medium Risk (01/11/2023)   Westley Hummer, MSW, LCSW Clinical Social Worker [place]  [phone]- work cell phone (preferred) [phone]- desk phone

## 2023-01-11 NOTE — Progress Notes (Signed)
Office Visit    Patient Name: Cassandra Reid Date of Encounter: 01/11/2023  PCP:  Ezequiel Essex, [facility]  Cardiologist:  Kirk Ruths, MD  Advanced Practice Provider:  No care team member to display Electrophysiologist:  None      Chief Complaint    Cassandra Reid is a 67 y.o. female presents today for chest pain, dyspnea.   Past Medical History    Past Medical History:  Diagnosis Date   Acute pain of left shoulder 04/03/2022   Allergic rhinitis 09/26/2018   Anosmia    Anosmia    Arthritis    Asthma    Balance problems 09/22/2021   C. difficile colitis 07/18/2018   Cerebral aneurysm without rupture    Chest pain 03/26/2020   Chronic pain syndrome 04/23/2019   CKD stage 3 due to type 2 diabetes mellitus 04/15/2019   DDD (degenerative disc disease), cervical 04/16/2019   Encephalomalacia on imaging study 08/25/2020   Brain MRI 08/25/21: Few scattered areas of encephalomalacia and gliosis within the overlying left frontal lobe most likely postoperative in nature.   Fatty liver 05/31/2020   Fibromyalgia    Former smoker    Generalized anxiety disorder    GERD (gastroesophageal reflux disease)    Heart murmur    History of chicken pox    History of colon polyps    Hyperlipidemia associated with type 2 diabetes mellitus 07/18/2018   Hypertension associated with diabetes 07/18/2018   IBS (irritable bowel syndrome)    Incontinence 09/22/2021   Bowel and bladder.    Ischemic brain injury 05/29/2021   Lacunar infarction 05/25/2021   Brain MRI 08/25/21: Few small remote lacunar infarcts noted at the right caudate and left internal capsule   Legally blind    Major depressive disorder    Migraines    Myalgia 04/23/2019   OSA (obstructive sleep apnea) 07/18/2018   not using CPAP machine (broken)   Osteoporosis 01/18/2021   Paroxysmal atrial tachycardia    On telemetry   Retention cyst of nasal sinus 08/25/2020   Brain MRI 08/25/20:  Large retention cyst largely fills the left maxillary sinus.   Type 2 diabetes mellitus with diabetic polyneuropathy, with long-term current use of insulin 07/18/2018   has dexcom G7  - sensor left upper arm   Past Surgical History:  Procedure Laterality Date   ABDOMINAL HYSTERECTOMY  2012   APPENDECTOMY  1972   CEREBRAL ANEURYSM REPAIR Left 2004   Aneurysm clip in place near the level of the left ICA on Brain MRI 08/2020. Also coiling performed   CHOLECYSTECTOMY  2015   COLONOSCOPY  11/24/2020   in [facility]   CRANIOTOMY Left    left pterional craniotomy   ETHMOIDECTOMY Left 11/23/2022   Procedure: LEFT ANTERIOR ETHMOIDECTOMY;  Surgeon: Jason Coop, DO;  Location: [facility];  Service: ENT;  Laterality: Left;   MAXILLARY ANTROSTOMY Left 11/23/2022   Procedure: MAXILLARY ANTROSTOMY;  Surgeon: Jason Coop, DO;  Location: [place];  Service: ENT;  Laterality: Left;   MENISCUS REPAIR Bilateral    NASAL TURBINATE REDUCTION Bilateral 11/23/2022   Procedure: Bilateral turbinate reduction;  Surgeon: Jason Coop, DO;  Location: [facility];  Service: ENT;  Laterality: Bilateral;   SINUS ENDO W/FUSION Left 11/23/2022   Procedure: Functional endoscopic sinus surgery with navigation.;  Surgeon: Jason Coop, DO;  Location: [place];  Service: ENT;  Laterality: Left;   UPPER GI ENDOSCOPY  11/24/2020  in [facility]    Allergies  No Known Allergies  History of Present Illness    Cassandra Reid is a 67 y.o. female with a hx of cerebral aneurysm w/o rupture, hypertension, PAT, chronic left maxillary sinusitis, OSA, GERD, fatty liver, CKD3, DM2, legally blind, obesity, depression, GAD, fibromyalgia, chronic pain.  Last seen 06/22/2022 by Finis Bud, NP.  Admitted to the hospital May 2021.  Troponins normal.  Echo LVEF 65-70%., mild LVH, grade 1 diastolic dysfunction, mild left atrial enlargement, trace MR. 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 Saw Dr. Stanford Breed  05/2021 at which time her lisinopril was increased to 20 mg daily.  Last seen 06/22/2022 by Finis Bud, NP.  She noted exertional dyspnea, palpitations, orthostatic dizziness over the past couple of months.  He was taking Lasix as needed for lower extremity edema.  She was working to get used to CPAP.  Subsequent monitor 07/18/2022 predominantly normal sinus rhythm, 17 runs of SVT longest 10 seconds, 3 beats of NSVT.  Echocardiogram 08/03/2022 LVEF 60 to [id number], grade 1 diastolic dysfunction, RV normal, no significant valvular abnormalities.  Saw primary care 01/07/2023 with fatigue, chest pinching for 1 week as well as constant dyspnea.  EKG NSR with no acute ST/T wave changes independently reviewed.  She presents today for follow up. She reports a pinching sensation to her left chest. Then it started happening in her right chest. She also reports a sharp pain in the back of her left calf which is of similar character to her chest pain. Pain happens at rest or with activity.  It is non pleuritic in nature. She notes she is short of breath and she is not sure if that is related to her weight. Has had mild cough with yellow productive cough. No fever, wheeze. She is also having night sweats which has been going on for a couple of weeks despite sleeping in light layers and keeping her house cool.   He exercise tolerance is limited by her chronic pain and walks in the parking lot for 10 minutes once or twice per day.  She does follow a low-sodium diet and eats predominantly at home.  Notes she does not drive as she is legally blind and is having to often pay $45 for transportation to doctors visits.  Interested in discussing with social work team.  BP range at home 140-150/50-60. Noes she has episodes of lightheadedness. Most often when changing position. No dizziness with spinning.  She does wear compression stockings regularly.Notes her blood sugars have been very labile - she describes as "shooting up and  dropping down".   EKGs/Labs/Other Studies Reviewed:   The following studies were reviewed today:  Echo 08/03/22   1. Left ventricular ejection fraction, by estimation, is 60 to 65%. The  left ventricle has normal function. The left ventricle has no regional  wall motion abnormalities. Left ventricular diastolic parameters are  consistent with Grade I diastolic  dysfunction (impaired relaxation). The average left ventricular global  longitudinal strain is -20.5 %. The global longitudinal strain is normal.   2. Right ventricular systolic function is normal. The right ventricular  size is normal.   3. The mitral valve is normal in structure. No evidence of mitral valve  regurgitation. No evidence of mitral stenosis.   4. The aortic valve is normal in structure. Aortic valve regurgitation is  not visualized. No aortic stenosis is present.   5. The inferior vena cava is normal in  size with greater than 50%  respiratory variability, suggesting right atrial pressure of 3 mmHg.   Comparison(s): No significant change from prior study. Prior images  reviewed side by side. 03/26/20 EF 65-70%.  Monitor 07/18/2022 Patch Wear Time:  10 days and 0 hours (2023-08-09T16:38:26-0400 to 2023-08-19T16:58:30-0400)   Patient had a min HR of 55 bpm, max HR of 210 bpm, and avg HR of 75 bpm. Predominant underlying rhythm was Sinus Rhythm. 17 Supraventricular Tachycardia runs occurred, the run with the fastest interval lasting 5 beats with a max rate of 210 bpm, the  longest lasting 10.0 secs with an avg rate of 127 bpm. Some episode of Supraventricular Tachycardia may be possible Atrial Tachycardia with variable block. Supraventricular Tachycardia was detected within +/- 45 seconds of symptomatic patient event(s).  Isolated SVEs were rare (<1.0%), SVE Couplets were rare (<1.0%), and SVE Triplets were rare (<1.0%). Isolated VEs were rare (<1.0%, 835), VE Couplets were rare (<1.0%, 2), and VE Triplets were rare  (<1.0%, 1).     Sinus bradycardia, NSR, sinus tachycardia; PACs, brief runs of PAT, PVCs and 3 beats NSVT  Cardiac telemetry monitor on May 03, 2020: 1.  Normal sinus rhythm with sinus bradycardia and sinus tachycardia. 2.  Frequent PACs and normal sinus atrial tachycardia 3.  Rare PVCs 4.  No atrial fibrillation 5.  No prolonged pauses 6.  Noise artifact.     Coronary CT on Mar 28, 2020: 1.  No evidence of coronary artery disease, CAD RADS = 0.  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 2.  Coronary calcium score of 0. 3.  Normal coronary origin with right dominance. 4.  Hepatic steatosis noted.     2D echo completed on Mar 26, 2020: 1.  LVEF is 65 to 70%.  The left ventricle has normal function.  The left ventricle has no regional wall motion abnormalities.  There is mild left ventricular hypertrophy.  Left ventricular diastolic parameters are consistent with grade 1 diastolic dysfunction (impaired relaxation). 2.  Right ventricular systolic function is low normal.  The right ventricular size is normal. 3.  Left atrial size is mildly dilated. 4.  The mitral valve is grossly normal.  Trivial mitral valve regurgitation. 5.  The aortic valve is tricuspid.  Aortic valve regurgitation is not visualized.  EKG:  EKG is not ordered today.   Recent Labs: 06/26/2022: Magnesium 2.1; TSH 1.500 11/23/2022: ALT 49; BUN 13; Creatinine, Ser 0.96; Platelets 182; Potassium 3.6; Sodium 136 01/07/2023: Hemoglobin 16.7  Recent Lipid Panel    Component Value Date/Time   CHOL 184 06/26/2022 0824   TRIG 266 (H) 06/26/2022 0824   HDL 48 06/26/2022 0824   CHOLHDL 3.8 06/26/2022 0824   CHOLHDL 2.9 07/31/2019 1339   VLDL 38.2 07/18/2018 0940   LDLCALC 92 06/26/2022 0824   LDLCALC 48 07/31/2019 1339     Home Medications   Current Meds  Medication Sig   Ascorbic Acid (VITAMIN C ADULT GUMMIES PO) Take 1 each by mouth daily in the afternoon.   BD PEN NEEDLE NANO 2ND  GEN 32G X 4 MM MISC USE WITH INSULIN   blood glucose meter kit and supplies Dispense based on patient and insurance preference. Use up to four times daily as directed. (FOR ICD-10 E10.9, E11.9).   Continuous Blood Gluc Receiver (DEXCOM G6 RECEIVER) DEVI Please use as directed to check blood sugar.   Continuous Blood Gluc Sensor (DEXCOM G6 SENSOR) MISC Use as directed to  check blood sugar.   Continuous Blood Gluc Transmit (DEXCOM G6 TRANSMITTER) MISC Use as directed to check blood sugar.   dapagliflozin propanediol (FARXIGA) 10 MG TABS tablet Take 1 tablet (10 mg total) by mouth daily.   dicyclomine (BENTYL) 20 MG tablet Take 1 tablet (20 mg total) by mouth 2 (two) times daily as needed for spasms.   DULoxetine (CYMBALTA) 60 MG capsule Take 1 capsule (60 mg total) by mouth daily.   ezetimibe (ZETIA) 10 MG tablet Take 1 tablet (10 mg total) by mouth daily.   famotidine (PEPCID) 20 MG tablet Take 1 tablet (20 mg total) by mouth daily.   FIBER SELECT GUMMIES PO Take 1 tablet by mouth daily in the afternoon.   fluticasone (FLONASE) 50 MCG/ACT nasal spray Place 1 spray into both nostrils daily as needed for allergies or rhinitis.   furosemide (LASIX) 40 MG tablet Take 1 tablet (40 mg total) by mouth as needed for edema. (Patient taking differently: Take 40 mg by mouth daily as needed (ankle swelling.).)   gabapentin (NEURONTIN) 600 MG tablet TAKE 0.5-1 TABLET (300-600 MG TOTAL) BY MOUTH 3 (THREE) TIMES DAILY. (Patient taking differently: Take 600 mg by mouth 2 (two) times daily.)   glucose blood test strip Use as instructed   insulin aspart (FIASP) 100 UNIT/ML FlexTouch Pen Inject 15-17 Units into the skin daily with breakfast AND 15-17 Units daily with lunch AND 12-14 Units daily with supper. This is a patient assistance medication. Patient may not be approved and/or have medication. Please ask and verify when performing med review..   insulin degludec (TRESIBA) 100 UNIT/ML FlexTouch Pen Inject 40 Units  into the skin daily. This is a patient assistance medication. Patient may not be approved and/or have medication. Please ask and verify when performing med review. (Patient taking differently: Inject 46 Units into the skin daily in the afternoon. This is a patient assistance medication. Patient may not be approved and/or have medication. Please ask and verify when performing med review.  Patient takes this medication in the afternoon.)   lisinopril (ZESTRIL) 40 MG tablet Take 1 tablet (40 mg total) by mouth daily. (Patient taking differently: Take 20 mg by mouth in the morning.)   loratadine (CLARITIN) 10 MG tablet TAKE 1 TABLET EVERY DAY   LORazepam (ATIVAN) 0.5 MG tablet TAKE 1 TABLET EVERY 8 HOURS AS NEEDED FOR ANXIETY   Magnesium 500 MG TABS Take 500 mg by mouth daily in the afternoon.   metoprolol succinate (TOPROL XL) 25 MG 24 hr tablet Take 1 tablet (25 mg total) by mouth daily.   metoprolol tartrate (LOPRESSOR) 100 MG tablet TAKE 1 TABLET 2 HOURS PRIOR TO CT   Multiple Vitamin (MULTIVITAMIN WITH MINERALS) TABS tablet Take 1 tablet by mouth in the morning.   OneTouch Delica Lancets 99991111 MISC Please use to check blood sugar up to 4 times daily. E11.42   Oyster Shell (OYSTER CALCIUM) 500 MG TABS tablet Take 500 mg of elemental calcium by mouth daily in the afternoon.   pantoprazole (PROTONIX) 40 MG tablet TAKE 1 TABLET EVERY DAY   potassium chloride SA (KLOR-CON M) 20 MEQ tablet TAKE 1 TABLET AS NEEDED (TAKE AS DIRECTED WITH LASIX AS NEEDED FOR EDEMA)   Semaglutide, 2 MG/DOSE, (OZEMPIC, 2 MG/DOSE,) 8 MG/3ML SOPN Inject 2 mg into the skin once a week.   sucralfate (CARAFATE) 1 g tablet TAKE 1 TABLET FOUR TIMES DAILY, WITH MEALS AND AT BEDTIME (OKAY TO TRIAL OFF AS INSTRUCTED) (Patient taking differently: Take  1 g by mouth in the morning and at bedtime.)   traZODone (DESYREL) 50 MG tablet TAKE 1/2 TABLET BY MOUTH AT BEDTIME AS NEEDED FOR SLEEP.     Review of Systems      All other systems  reviewed and are otherwise negative except as noted above.  Physical Exam    VS:  BP (!) 150/60 Comment: home BP  Pulse 81   Ht '5\' 1"'$  (1.549 m)   Wt 182 lb (82.6 kg)   BMI 34.39 kg/m  , BMI Body mass index is 34.39 kg/m.  Wt Readings from Last 3 Encounters:  01/11/23 182 lb (82.6 kg)  01/07/23 180 lb 9.6 oz (81.9 kg)  12/07/22 179 lb 9.6 oz (81.5 kg)    GEN: Well nourished, overweight, well developed, in no acute distress. HEENT: normal. Neck: Supple, no JVD, carotid bruits, or masses. Cardiac: RRR, no murmurs, rubs, or gallops. No clubbing, cyanosis, edema.  Radials/PT 2+ and equal bilaterally.  Respiratory:  Respirations regular and unlabored, clear to auscultation bilaterally. GI: Soft, nontender, nondistended. MS: No deformity or atrophy. Skin: Warm and dry, no rash. Neuro:  Strength and sensation are intact. Psych: Normal affect.  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  She notes atypical chest pain and the sensation of being sharp.  Also notes some discomfort in her left calf.  However, given risk factors we will plan for repeat cardiac CTA.  Add Toprol 25 mg daily as antianginal agent given myocardial bridge.  Update BMP, magnesium today to rule out electrolyte abnormality as contributory to possible spasm causing chest discomfort.  Palpitations -prior monitor with brief episodes of SVT.  Add metoprolol, as above.  Hypertension -BP elevated in clinic.  Add metoprolol, as above.  Hesitant to add multiple agents as she also reports some orthostatic symptoms.  She will continue to wear her compression stockings.  Continue present dose lisinopril.  CKD 3- Careful titration of diuretic and antihypertensive.  Follows with [employer]   HLD- 06/2022 LDL 92. Continue Zetia 10 mg daily.  HYPERTENSION CONTROL Vitals:   01/11/23 0825 01/11/23  1349  BP: (!) 148/70 (!) 150/60    The patient's blood pressure is elevated above target today.  In order to address the patient's elevated BP: A new medication was prescribed today.; Follow up with general cardiology has been recommended.         Disposition: Follow up in 6 week(s) with Kirk Ruths, MD or APP.  Signed, Loel Dubonnet, NP 01/11/2023, 1:50 PM [facility]

## 2023-01-11 NOTE — Telephone Encounter (Signed)
Left message for patient to call and discuss rescheduling the Friday 02/22/23 appointment with Laurann Montana, NP (provider not in

## 2023-01-11 NOTE — Patient Instructions (Addendum)
Medication Instructions:  START METOPROLOL SUCC 25 MG DAILY   TAKE METOPROLOL TART 100 MG 2 HOURS PRIOR TO CT   Labwork: BMET/MAGNESIUM TODAY   Testing/Procedures: Your physician has requested that you have cardiac CT. Cardiac computed tomography (CT) is a painless test that uses an x-ray machine to take clear, detailed pictures of your heart. For further information please visit [email or web address]. Please follow instruction sheet as given. THE OFFICE WILL CALL YOU TO ARRANGE ONCE INSURANCE HAS BEEN REVIEWED. IF YOU DO NOT HEAR IN 2 WEEKS CALL THE OFFICE TO FOLLOW UP  Follow-Up: 6 WEEKS   Any Other Special Instructions Will Be Listed Below (If Applicable).  Your cardiac CT will be scheduled at one of the below locations:   [facility] [address] [place], [place] [address] (336) [place] [address] [place], [place] [address] [phone]  [facility] [place], [place] [address] [phone]  If scheduled at [facility], please arrive at the [facility] and Children's Entrance (Entrance C2) of [facility] 30 minutes prior to test start time. You can use the FREE valet parking offered at entrance C (encouraged to control the heart rate for the test)  Proceed to the [facility] Radiology Department (first floor) to check-in and test prep.  All radiology patients and guests should use entrance C2 at [facility], accessed from [facility], even though the hospital's physical address listed is [address].    If scheduled at [facility] or [facility], please arrive 15 mins early for check-in and test prep.   Please follow these instructions carefully (unless otherwise directed):  Hold all erectile dysfunction medications at least 3 days (72 hrs) prior to test.  (Ie viagra, cialis, sildenafil, tadalafil, etc) We will administer nitroglycerin during this exam.   On the Night Before the Test: Be sure to Drink plenty of water. Do not consume any caffeinated/decaffeinated beverages or chocolate 12 hours prior to your test. Do not take any antihistamines 12 hours prior to your test.  On the Day of the Test: Drink plenty of water until 1 hour prior to the test. Do not eat any food 1 hour prior to test. You may take your regular medications prior to the test.  Take metoprolol (Lopressor) two hours prior to test. If you take Furosemide/Hydrochlorothiazide/Spironolactone, please HOLD on the morning of the test. FEMALES- please wear underwire-free bra if available, avoid dresses & tight clothing      After the Test: Drink plenty of water. After receiving IV contrast, you may experience a mild flushed feeling. This is normal. On occasion, you may experience a mild rash up to 24 hours after the test. This is not dangerous. If this occurs, you can take Benadryl 25 mg and increase your fluid intake. If you experience trouble breathing, this can be serious. If it is severe call 911 IMMEDIATELY. If it is mild, please call our office. If you take any of these medications: Glipizide/Metformin, Avandament, Glucavance, please do not take 48 hours after completing test unless otherwise instructed.  We will call to schedule your test 2-4 weeks out understanding that some insurance companies will need an authorization prior to the service being performed.   For non-scheduling related questions, please contact the cardiac imaging nurse navigator should you have any questions/concerns: Marchia Bond, Cardiac Imaging Nurse Navigator Kerin Ransom  Jearld Shines, Cardiac Imaging Nurse Navigator [facility] Direct Office Dial: [phone]   For scheduling needs, including cancellations and rescheduling, please call [place], [phone].  Cardiac CT  Angiogram A cardiac CT angiogram is a procedure to look at the heart and the area around the heart. It may be done to help find the cause of chest pains or other symptoms of heart disease. During this procedure, a substance called contrast dye is injected into a vein in the arm. The contrast highlights the blood vessels in the area to be checked. A large X-ray machine (CT scanner), then takes detailed pictures of the heart and the surrounding area. The procedure is also sometimes called a coronary CT angiogram, coronary artery scanning, or CTA. A cardiac CT angiogram allows the health care provider to see how well blood is flowing to and from the heart. The provider will be able to see if there are any problems, such as: Blockage or narrowing of the arteries in the heart. Fluid around the heart. Signs of weakness or disease in the muscles, valves, and tissues of the heart. Tell a health care provider about: Any allergies you have. This is especially important if you have had a previous allergic reaction to medicines, contrast dye, or iodine. All medicines you are taking, including vitamins, herbs, eye drops, creams, and over-the-counter medicines. Any bleeding problems you have. Any surgeries you have had. Any medical conditions you have, including kidney problems or kidney failure. Whether you are pregnant or may be pregnant. Any anxiety disorders, chronic pain, or other conditions you have. These may increase your stress or prevent you from lying still. Any history of abnormal heart rhythms or heart procedures. What are the risks? Your provider will talk with you about risks. These may include: Bleeding. Infection. Allergic reactions to medicines or dyes. Damage to other structures or organs. Kidney damage from the contrast dye. Increased risk of cancer from radiation exposure. This risk is low. Talk with your provider about: The risks and benefits of testing. How you can receive the lowest  dose of radiation. What happens before the procedure? Wear comfortable clothing and remove any jewelry, glasses, dentures, and hearing aids. Follow instructions from your provider about eating and drinking. These may include: 12 hours before the procedure Avoid caffeine. This includes tea, coffee, soda, energy drinks, and diet pills. Drink plenty of water or other fluids that do not have caffeine in them. Being well hydrated can prevent complications. 4-6 hours before the procedure Stop eating and drinking. This will reduce the risk of nausea from the contrast dye. Ask your provider about changing or stopping your regular medicines. These include: Diabetes medicines. Medicines to treat problems with erections (erectile dysfunction). If you have kidney problems, you may need to receive IV hydration before and after the test. What happens during the procedure?  Hair on your chest may need to be removed so that small sticky patches called electrodes can be placed on your chest. These will transmit information that helps to monitor your heart during the procedure. An IV will be inserted into one of your veins. You might be given a medicine to control your heart rate during the procedure. This will help to ensure that good images are obtained. You will be asked to lie on an exam table. This table will slide in and out of the CT machine during the procedure. Contrast dye will be injected into the IV. You might feel warm, or you may get a  metallic taste in your mouth. You may be given medicines to relax or dilate the arteries in your heart. If you are allergic to contrast dyes or iodine you may be given medicine before the test to reduce the risk of an allergic reaction. The table that you are lying on will move into the CT machine tunnel for the scan. The person running the machine will give you instructions while the scans are being done. You may be asked to: Keep your arms above your head. Hold  your breath for short periods. Stay very still, even if the table is moving. The procedure may vary among providers and hospitals. What can I expect after the procedure? After your procedure, it is common to have: A metallic taste in your mouth from the contrast dye. A feeling of warmth. A headache from the heart medicine. Follow these instructions at home: Take over-the-counter and prescription medicines only as told by your provider. If you are told, drink enough fluid to keep your pee pale yellow. This will help to flush the contrast dye out of your body. Most people can return to their normal activities right after the procedure. Ask your provider what activities are safe for you. It is up to you to get the results of your procedure. Ask your provider, or the department that is doing the procedure, when your results will be ready. Contact a health care provider if: You have any symptoms of allergy to the contrast dye. These include: Shortness of breath. Rash or hives. A racing heartbeat. You notice a change in your peeing (urination). This information is not intended to replace advice given to you by your health care provider. Make sure you discuss any questions you have with your health care provider. Document Revised: 06/08/2022 Document Reviewed: 06/08/2022 Elsevier Patient Education  [place].

## 2023-01-12 LAB — BASIC METABOLIC PANEL
BUN/Creatinine Ratio: 24 (ref 12–28)
BUN: 28 mg/dL — ABNORMAL HIGH (ref 8–27)
CO2: 19 mmol/L — ABNORMAL LOW (ref 20–29)
Calcium: 9.8 mg/dL (ref 8.7–10.3)
Chloride: 106 mmol/L (ref 96–106)
Creatinine, Ser: 1.17 mg/dL — ABNORMAL HIGH (ref 0.57–1.00)
Glucose: 135 mg/dL — ABNORMAL HIGH (ref 70–99)
Potassium: 4.6 mmol/L (ref 3.5–5.2)
Sodium: 143 mmol/L (ref 134–144)
eGFR: 52 mL/min/{1.73_m2} — ABNORMAL LOW (ref >59)

## 2023-01-12 LAB — MAGNESIUM: Magnesium: 2.2 mg/dL (ref 1.6–2.3)

## 2023-01-18 ENCOUNTER — Other Ambulatory Visit: Payer: Self-pay | Admitting: Family Medicine

## 2023-01-18 ENCOUNTER — Telehealth (HOSPITAL_COMMUNITY): Payer: Self-pay | Admitting: Emergency Medicine

## 2023-01-18 DIAGNOSIS — Z794 Long term (current) use of insulin: Secondary | ICD-10-CM

## 2023-01-18 NOTE — Telephone Encounter (Signed)
Reaching out to patient to offer assistance regarding upcoming cardiac imaging study; pt verbalizes understanding of appt date/time, parking situation and where to check in, pre-test NPO status and medications ordered, and verified current allergies; name and call back number provided for further questions should they arise Cassandra Bond RN Navigator Cardiac Imaging [facility] [phone] office [phone] cell  Arrival [address] entrance Denies iv issues '100mg'$  metoprolol tartrate Holding lasix Aware contrast/nitr

## 2023-01-18 NOTE — Telephone Encounter (Signed)
Attempted to call patient regarding upcoming cardiac CT appointment. °Left message on voicemail with name and callback number °Marquasia Schmieder RN Navigator Cardiac Imaging °[facility] °[phone] Office °[phone] Cell ° °

## 2023-01-22 ENCOUNTER — Ambulatory Visit (HOSPITAL_COMMUNITY)
Admission: RE | Admit: 2023-01-22 | Discharge: 2023-01-22 | Disposition: A | Discharge status: Home / Self Care | Payer: Medicare PPO | Source: Ambulatory Visit | Attending: Family | Admitting: Family

## 2023-01-22 ENCOUNTER — Other Ambulatory Visit: Payer: Self-pay | Admitting: Family Medicine

## 2023-01-22 DIAGNOSIS — H6992 Unspecified Eustachian tube disorder, left ear: Secondary | ICD-10-CM

## 2023-01-22 DIAGNOSIS — R072 Precordial pain: Secondary | ICD-10-CM | POA: Diagnosis not present

## 2023-01-22 DIAGNOSIS — I10 Essential (primary) hypertension: Secondary | ICD-10-CM | POA: Insufficient documentation

## 2023-01-22 MED ORDER — IOHEXOL 350 MG/ML SOLN
100.0000 mL | Freq: Once | INTRAVENOUS | Status: AC | PRN
  Administered 2023-01-22: 100 mL via INTRAVENOUS

## 2023-01-22 MED ORDER — METOPROLOL TARTRATE 5 MG/5ML IV SOLN
INTRAVENOUS | Status: AC
  Filled 2023-01-22: qty 10

## 2023-01-22 MED ORDER — DILTIAZEM HCL 25 MG/5ML IV SOLN
10.0000 mg | Freq: Once | INTRAVENOUS | Status: AC
  Administered 2023-01-22: 10 mg via INTRAVENOUS

## 2023-01-22 MED ORDER — DILTIAZEM HCL 25 MG/5ML IV SOLN: 10.0000 mg | Freq: Once | INTRAVENOUS | Status: AC

## 2023-01-22 MED ORDER — NITROGLYCERIN 0.4 MG SL SUBL
SUBLINGUAL_TABLET | SUBLINGUAL | Status: AC
  Administered 2023-01-22: 0.8 mg via SUBLINGUAL
  Filled 2023-01-22: qty 2

## 2023-01-22 MED ORDER — DILTIAZEM HCL 25 MG/5ML IV SOLN
INTRAVENOUS | Status: AC
  Administered 2023-01-22: 10 mg via INTRAVENOUS
  Filled 2023-01-22: qty 5

## 2023-01-22 MED ORDER — NITROGLYCERIN 0.4 MG SL SUBL: 0.8000 mg | SUBLINGUAL_TABLET | Freq: Once | SUBLINGUAL | Status: AC

## 2023-01-22 MED ORDER — METOPROLOL TARTRATE 5 MG/5ML IV SOLN
10.0000 mg | INTRAVENOUS | Status: DC | PRN
  Administered 2023-01-22: 10 mg via INTRAVENOUS

## 2023-01-24 ENCOUNTER — Encounter (HOSPITAL_BASED_OUTPATIENT_CLINIC_OR_DEPARTMENT_OTHER): Payer: Self-pay

## 2023-02-08 DIAGNOSIS — E1122 Type 2 diabetes mellitus with diabetic chronic kidney disease: Secondary | ICD-10-CM | POA: Diagnosis not present

## 2023-02-08 DIAGNOSIS — N2581 Secondary hyperparathyroidism of renal origin: Secondary | ICD-10-CM | POA: Diagnosis not present

## 2023-02-08 DIAGNOSIS — I129 Hypertensive chronic kidney disease with stage 1 through stage 4 chronic kidney disease, or unspecified chronic kidney disease: Secondary | ICD-10-CM | POA: Diagnosis not present

## 2023-02-08 DIAGNOSIS — N183 Chronic kidney disease, stage 3 unspecified: Secondary | ICD-10-CM | POA: Diagnosis not present

## 2023-02-08 DIAGNOSIS — D631 Anemia in chronic kidney disease: Secondary | ICD-10-CM | POA: Diagnosis not present

## 2023-02-10 LAB — LAB REPORT - SCANNED
Creatinine, POC: 82.6 mg/dL
EGFR: 45
Microalbumin, Urine: 6.8

## 2023-02-11 ENCOUNTER — Encounter (HOSPITAL_BASED_OUTPATIENT_CLINIC_OR_DEPARTMENT_OTHER): Payer: Self-pay

## 2023-02-12 ENCOUNTER — Emergency Department (HOSPITAL_BASED_OUTPATIENT_CLINIC_OR_DEPARTMENT_OTHER): Payer: Medicare PPO | Admitting: Radiology

## 2023-02-12 ENCOUNTER — Other Ambulatory Visit: Payer: Self-pay

## 2023-02-12 ENCOUNTER — Encounter (HOSPITAL_BASED_OUTPATIENT_CLINIC_OR_DEPARTMENT_OTHER): Payer: Self-pay | Admitting: Emergency Medicine

## 2023-02-12 ENCOUNTER — Telehealth (INDEPENDENT_AMBULATORY_CARE_PROVIDER_SITE_OTHER): Payer: Medicare PPO | Admitting: Family Medicine

## 2023-02-12 ENCOUNTER — Emergency Department (HOSPITAL_BASED_OUTPATIENT_CLINIC_OR_DEPARTMENT_OTHER)
Admission: EM | Admit: 2023-02-12 | Discharge: 2023-02-12 | Disposition: A | Discharge status: Home / Self Care | Payer: Medicare PPO | Attending: Emergency Medicine | Admitting: Emergency Medicine

## 2023-02-12 DIAGNOSIS — J45909 Unspecified asthma, uncomplicated: Secondary | ICD-10-CM | POA: Insufficient documentation

## 2023-02-12 DIAGNOSIS — I129 Hypertensive chronic kidney disease with stage 1 through stage 4 chronic kidney disease, or unspecified chronic kidney disease: Secondary | ICD-10-CM | POA: Diagnosis not present

## 2023-02-12 DIAGNOSIS — R0602 Shortness of breath: Secondary | ICD-10-CM | POA: Diagnosis not present

## 2023-02-12 DIAGNOSIS — R0609 Other forms of dyspnea: Secondary | ICD-10-CM | POA: Insufficient documentation

## 2023-02-12 DIAGNOSIS — R072 Precordial pain: Secondary | ICD-10-CM

## 2023-02-12 DIAGNOSIS — N183 Chronic kidney disease, stage 3 unspecified: Secondary | ICD-10-CM | POA: Insufficient documentation

## 2023-02-12 DIAGNOSIS — Z79899 Other long term (current) drug therapy: Secondary | ICD-10-CM | POA: Diagnosis not present

## 2023-02-12 DIAGNOSIS — Z7951 Long term (current) use of inhaled steroids: Secondary | ICD-10-CM | POA: Diagnosis not present

## 2023-02-12 LAB — BASIC METABOLIC PANEL
Anion gap: 11 (ref 5–15)
BUN: 17 mg/dL (ref 8–23)
CO2: 24 mmol/L (ref 22–32)
Calcium: 10.3 mg/dL (ref 8.9–10.3)
Chloride: 104 mmol/L (ref 98–111)
Creatinine, Ser: 1.06 mg/dL — ABNORMAL HIGH (ref 0.44–1.00)
GFR, Estimated: 58 mL/min — ABNORMAL LOW (ref >60)
Glucose, Bld: 121 mg/dL — ABNORMAL HIGH (ref 70–99)
Potassium: 3.9 mmol/L (ref 3.5–5.1)
Sodium: 139 mmol/L (ref 135–145)

## 2023-02-12 LAB — TROPONIN I (HIGH SENSITIVITY): Troponin I (High Sensitivity): 3 ng/L (ref <18)

## 2023-02-12 LAB — CBC
HCT: 49.1 % — ABNORMAL HIGH (ref 36.0–46.0)
Hemoglobin: 16.6 g/dL — ABNORMAL HIGH (ref 12.0–15.0)
MCH: 29.4 pg (ref 26.0–34.0)
MCHC: 33.8 g/dL (ref 30.0–36.0)
MCV: 87.1 fL (ref 80.0–100.0)
Platelets: 188 10*3/uL (ref 150–400)
RBC: 5.64 MIL/uL — ABNORMAL HIGH (ref 3.87–5.11)
RDW: 14.1 % (ref 11.5–15.5)
WBC: 8.1 10*3/uL (ref 4.0–10.5)
nRBC: 0 % (ref 0.0–0.2)

## 2023-02-12 LAB — D-DIMER, QUANTITATIVE: D-Dimer, Quant: 0.35 ug/mL-FEU (ref 0.00–0.50)

## 2023-02-12 LAB — BRAIN NATRIURETIC PEPTIDE: B Natriuretic Peptide: 57 pg/mL (ref 0.0–100.0)

## 2023-02-12 NOTE — Discharge Instructions (Addendum)
Your laboratories also are within normal limits today.  Your chest x-ray did not show any acute findings.  Follow-up with your primary care physician at your earliest convenience.  Please schedule an appointment with your pulmonologist for further evaluation of your ongoing breathing.

## 2023-02-12 NOTE — Progress Notes (Signed)
[facility] Telemedicine Visit  Patient consented to have virtual visit and was identified by name and date of birth. 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 Tach, GERD, fatty liver, T2DM, CKD3, HLD, osteoporosis, TMJ  Exam:  There were no vitals taken for this visit.  Respiratory: speaking clearly in full sentences  Assessment/Plan:  Chest pain Ongoing worsening dyspnea over the last month. No clear inciting event. Has seen cardiology, repeat CT cardiac w/ calcium score of 0. Cards added metoprolol as anti-anginal although patient has not seen any benefit. Wonder if subacute PE given CP w/ DOE. Precepted with attending Dr. Owens Shark, who recommended ED evaluation. Will send patient to ED. Pending ED work up, would plan for pulmonary function testing with Dr. Valentina Lucks here in clinic. Patient voices understanding and will go to ED.   Time spent during visit with patient: 20 minutes  Ezequiel Essex, MD  PGY-3 [facility]

## 2023-02-12 NOTE — ED Provider Notes (Signed)
Cassandra Reid Provider Note   CSN: [id number] Arrival date & time: 02/12/23  1608     History Asthma, CKD, Fibromyalgia  Chief Complaint  Patient presents with   Shortness of Breath     With exertion    Cassandra Reid is a 67 y.o. female.  67 year old female with a past medical history of asthma, fibromyalgia, CKD presents to the ED with a chief complaint of dyspnea on exertion for the past month.  Patient had a telehealth visit today with PCP who recommended she be evaluated in the emergency department obtain a hemoglobin along with a chest x-ray.  Patient reports her symptoms have worsened over the past month, she is unable to get from 1 place to another without feeling very short of breath, reports her daughter also noticed this going on.  She has taken Lasix in the past, reports he is only able to take this twice a week as her kidney function is not the best.  She also reports seeing a cardiologist previously Dr. Gilford Rile who performed a cardiac scan but she does not know these results yet.  She endorses a slight cough, like her throat was tickling and like allergies.  She did smoke for 6 years, smoking about a half a pack a day, she quit 37 years ago.  Denies any swelling to her legs, no chest pain, no fevers.  The history is provided by the patient and medical records.  Shortness of Breath Associated symptoms: no abdominal pain, no chest pain, no fever, no headaches, no sore throat and no vomiting        Home Medications Prior to Admission medications   Medication Sig Start Date End Date Taking? Authorizing Provider  Ascorbic Acid (VITAMIN C ADULT GUMMIES PO) Take 1 each by mouth daily in the afternoon.    [provider]  BD PEN NEEDLE NANO 2ND GEN 32G X 4 MM MISC USE WITH INSULIN 02/21/21   Ezequiel Essex, MD  blood glucose meter kit and supplies Dispense based on patient and insurance preference. Use up to four times daily as  directed. (FOR ICD-10 E10.9, E11.9). 03/09/22   Ezequiel Essex, MD  Continuous Blood Gluc Receiver ([place]) DEVI Please use as directed to check blood sugar. 01/17/22   Ezequiel Essex, MD  Continuous Blood Gluc Sensor (DEXCOM G6 SENSOR) MISC Use as directed to check blood sugar. 01/17/22   Ezequiel Essex, MD  Continuous Blood Gluc Transmit (DEXCOM G6 TRANSMITTER) MISC Use as directed to check blood sugar. 01/17/22   Ezequiel Essex, MD  dapagliflozin propanediol (FARXIGA) 10 MG TABS tablet Take 1 tablet (10 mg total) by mouth daily. 11/15/22   Ezequiel Essex, MD  dicyclomine (BENTYL) 20 MG tablet Take 1 tablet (20 mg total) by mouth 2 (two) times daily as needed for spasms. 10/15/21   [name], PA-C  DULoxetine (CYMBALTA) 60 MG capsule Take 1 capsule (60 mg total) by mouth daily. 12/10/22   Ezequiel Essex, MD  ezetimibe (ZETIA) 10 MG tablet Take 1 tablet (10 mg total) by mouth daily. 07/16/22   Ezequiel Essex, MD  famotidine (PEPCID) 20 MG tablet Take 1 tablet (20 mg total) by mouth daily. 06/05/22   Ezequiel Essex, MD  FIBER SELECT GUMMIES PO Take 1 tablet by mouth daily in the afternoon.    [provider]  fluticasone (FLONASE) 50 MCG/ACT nasal spray Place 1 spray into both nostrils daily as needed for allergies or rhinitis. 08/09/21  Brimage, Vondra, DO  furosemide (LASIX) 40 MG tablet Take 1 tablet (40 mg total) by mouth as needed for edema. Patient taking differently: Take 40 mg by mouth daily as needed (ankle swelling.). 06/22/22   Finis Bud, NP  gabapentin (NEURONTIN) 600 MG tablet TAKE 0.5-1 TABLET (300-600 MG TOTAL) BY MOUTH 3 (THREE) TIMES DAILY. Patient taking differently: Take 600 mg by mouth 2 (two) times daily. 07/24/22   Ezequiel Essex, MD  insulin aspart (FIASP) 100 UNIT/ML FlexTouch Pen Inject 15-17 Units into the skin daily with breakfast AND 15-17 Units daily with lunch AND 12-14 Units daily with supper. This is a patient assistance medication.  Patient may not be approved and/or have medication. Please ask and verify when performing med review.. 01/04/23   Leavy Cella, RPH-CPP  insulin degludec (TRESIBA) 100 UNIT/ML FlexTouch Pen Inject 40 Units into the skin daily. This is a patient assistance medication. Patient may not be approved and/or have medication. Please ask and verify when performing med review. Patient taking differently: Inject 46 Units into the skin daily in the afternoon. This is a patient assistance medication. Patient may not be approved and/or have medication. Please ask and verify when performing med review.  Patient takes this medication in the afternoon. 09/13/22   Leavy Cella, RPH-CPP  lisinopril (ZESTRIL) 40 MG tablet Take 1 tablet (40 mg total) by mouth daily. Patient taking differently: Take 20 mg by mouth in the morning. 07/13/22   Finis Bud, NP  loratadine (CLARITIN) 10 MG tablet TAKE 1 TABLET EVERY DAY 01/22/23   Ezequiel Essex, MD  LORazepam (ATIVAN) 0.5 MG tablet TAKE 1 TABLET EVERY 8 HOURS AS NEEDED FOR ANXIETY 12/10/22   Ezequiel Essex, MD  Magnesium 500 MG TABS Take 500 mg by mouth daily in the afternoon.    [provider]  metoprolol succinate (TOPROL XL) 25 MG 24 hr tablet Take 1 tablet (25 mg total) by mouth daily. 01/11/23   Loel Dubonnet, NP  metoprolol tartrate (LOPRESSOR) 100 MG tablet TAKE 1 TABLET 2 HOURS PRIOR TO CT 01/11/23   Loel Dubonnet, NP  Multiple Vitamin (MULTIVITAMIN WITH MINERALS) TABS tablet Take 1 tablet by mouth in the morning.    [provider]  OneTouch Delica Lancets [id number] MISC Please use to check blood sugar up to 4 times daily. E11.42 03/09/22   Ezequiel Essex, MD  [place] (OYSTER CALCIUM) 500 MG TABS tablet Take 500 mg of elemental calcium by mouth daily in the afternoon.    [provider]  pantoprazole (PROTONIX) 40 MG tablet TAKE 1 TABLET EVERY DAY 12/10/22   Ezequiel Essex, MD  potassium chloride SA (KLOR-CON M) 20 MEQ tablet  TAKE 1 TABLET AS NEEDED (TAKE AS DIRECTED WITH LASIX AS NEEDED FOR EDEMA) 12/10/22   Finis Bud, NP  Semaglutide, 2 MG/DOSE, (OZEMPIC, 2 MG/DOSE,) 8 MG/3ML SOPN Inject 2 mg into the skin once a week. 12/10/22   Ezequiel Essex, MD  sucralfate (CARAFATE) 1 g tablet TAKE 1 TABLET FOUR TIMES DAILY, WITH MEALS AND AT BEDTIME (OKAY TO TRIAL OFF AS INSTRUCTED) Patient taking differently: Take 1 g by mouth in the morning and at bedtime. 03/13/22   Ezequiel Essex, MD  traZODone (DESYREL) 50 MG tablet TAKE 1/2 TABLET BY MOUTH AT BEDTIME AS NEEDED FOR SLEEP. 01/17/22   Ezequiel Essex, MD  TRUE METRIX BLOOD GLUCOSE TEST test strip TEST BLOOD SUGAR AS INSTRUCTED 01/21/23   Ezequiel Essex, MD      Allergies  Patient has no known allergies.    Review of Systems   Review of Systems  Constitutional:  Negative for chills and fever.  HENT:  Negative for sore throat.   Respiratory:  Positive for shortness of breath.   Cardiovascular:  Negative for chest pain and leg swelling.  Gastrointestinal:  Negative for abdominal pain, nausea and vomiting.  Genitourinary:  Negative for flank pain.  Musculoskeletal:  Negative for back pain.  Neurological:  Negative for light-headedness and headaches.  All other systems reviewed and are negative.   Physical Exam Updated Vital Signs BP (!) 165/75 (BP Location: Right Arm)   Pulse 91   Temp 97.8 F (36.6 C)   Resp 16   Ht 5\' 1"  (1.549 m)   Wt 79.8 kg   SpO2 100%   BMI 33.25 kg/m  Physical Exam Vitals and nursing note reviewed.  Constitutional:      Appearance: She is well-developed.  HENT:     Head: Normocephalic and atraumatic.     Comments: Facial telangiectasias   Cardiovascular:     Rate and Rhythm: Normal rate.  Pulmonary:     Effort: Pulmonary effort is normal.     Breath sounds: No decreased breath sounds or wheezing.     Comments: Lungs are clear without any wheezing, rhonchi or rales. Chest:     Chest wall: No tenderness.  Abdominal:      Palpations: Abdomen is soft.     Tenderness: There is no abdominal tenderness.  Musculoskeletal:     Cervical back: Normal range of motion and neck supple.     Right lower leg: No edema.     Left lower leg: No edema.     Comments: No bilateral pitting edema, no calf tenderness bilaterally.  Skin:    General: Skin is warm and dry.  Neurological:     Mental Status: She is alert and oriented to person, place, and time.     ED Results / Procedures / Treatments   Labs (all labs ordered are listed, but only abnormal results are displayed) Labs Reviewed  BASIC METABOLIC PANEL - Abnormal; Notable for the following components:      Result Value   Glucose, Bld 121 (*)    Creatinine, Ser 1.06 (*)    GFR, Estimated 58 (*)    All other components within normal limits  CBC - Abnormal; Notable for the following components:   RBC 5.64 (*)    Hemoglobin 16.6 (*)    HCT 49.1 (*)    All other components within normal limits  BRAIN NATRIURETIC PEPTIDE  D-DIMER, QUANTITATIVE  TROPONIN I (HIGH SENSITIVITY)    EKG EKG Interpretation  Date/Time:  Tuesday February 12 2023 16:17:34 EDT Ventricular Rate:  96 PR Interval:  170 QRS Duration: 74 QT Interval:  326 QTC Calculation: 411 R Axis:   83 Text Interpretation: Normal sinus rhythm Low voltage QRS Nonspecific ST abnormality nonspecific ST segments similar to priors Confirmed by Sherwood Gambler [phone]) on 02/12/2023 4:24:45 PM  Radiology DG Chest 2 View  Result Date: 02/12/2023 CLINICAL DATA:  Shortness of breath. EXAM: CHEST - 2 VIEW COMPARISON:  October 15, 2021. FINDINGS: The heart size and mediastinal contours are within normal limits. Both lungs are clear. The visualized skeletal structures are unremarkable. IMPRESSION: No active cardiopulmonary disease. Electronically Signed   By: Marijo Conception M.D.   On: 02/12/2023 16:44    Procedures Procedures    Medications Ordered in ED Medications - No data to display  ED Course/ Medical  Decision Making/ A&P                             Medical Decision Making Amount and/or Complexity of Data Reviewed Labs: ordered. Radiology: ordered.   This patient presents to the ED for concern of DOE, this involves a number of treatment options, and is a complaint that carries with it a high risk of complications and morbidity.  The differential diagnosis includes CHF, PE versus undiagnosed chronic respiratory etiology.    Co morbidities: Discussed in HPI   Brief History:  Patient with underlying history of asthma here with dyspnea on exertion for the past month, seen by PCP via telehealth today and referred to the ED for further workup.  Exacerbated with any type of movement, alleviated somewhat at rest.  No underlying CHF, quit smoking approximately 37 years ago.  EMR reviewed including pt PMHx, past surgical history and past visits to ER.   See HPI for more details   Lab Tests:  I ordered and independently interpreted labs.  The pertinent results include:    Labs notable for CBC with a elevation in her hemoglobin at 16.6, this was requested per prior PCP who saw her via telehealth.  BMP without any electrolyte derangement, creatinine level slightly elevated but she has no history of CKD 3.  First troponin was 3, she is currently not having any active chest pain I do not suspect ACS.  BNP is negative, no prior history of CHF.  Dimer was also negative.   Imaging Studies:  NAD. I personally reviewed all imaging studies and no acute abnormality found. I agree with radiology interpretation.   Cardiac Monitoring:  The patient was maintained on a cardiac monitor.  I personally viewed and interpreted the cardiac monitored which showed an underlying rhythm of: NSR 96 EKG non-ischemic   Medicines ordered:  N/A  Critical Interventions:   ambulated with pulse ox, no increase in her heart rate, no hypoxia.  Reevaluation:  After the interventions noted above I  re-evaluated patient and found that they have :stayed the same   Social Determinants of Health:  The patient's social determinants of health were a factor in the care of this patient   Problem List / ED Course:  Patient prior history of CKD 3 hypertension here with dyspnea on exertion worsening over the last month.  Had a telehealth appointment today with PCP who instructed her to have blood work along with chest x-ray done in the ED.  Here labs remarkable for elevated hemoglobin which has continued to be elevated, she did have a prior history of tobacco use but discontinued this 37 years ago.  CMP with no electrolyte derangement.  Creatinine level is slightly elevated however she does have CKD 3.  Concern for pulmonary embolism, without any tachycardia, hypoxia dimer level was obtained which was negative.  Satting at 100% on room air, ambulated without any increase in her heart rate, or hypoxia noted.  BNP is also negative, low suspicion for CHF.  Chest x-ray without any infectious process, she denies any fever, productive cough, do not suspect illness at this time.  She does have a prior pulmonologist she seen in the past due to a CPAP, does not wear any oxygen at home and does not require new oxygen here in the emergency department.  Troponin along with EKG are reassuring, low suspicion for ACS.   Patient also had cardiac CT performed  earlier this month, results were reviewed via MyChart of their extensive chart review, with normal results.  She is not having any chest pain today, no prior history of CAD, no family history of CAD, low suspicion for ACS.  We discussed appropriate follow-up with pulmonology for likely a chronic component to this.  Patient is agreeable to plan and treatment.  Hemodynamically stable for discharge.   Dispostion:  After consideration of the diagnostic results and the patients response to treatment, I feel that the patent would benefit from follow up with pulmonology.     Portions of this note were generated with Lobby[employer]. Dictation errors may occur despite best attempts at proofreading.   Final Clinical Impression(s) / ED Diagnoses Final diagnoses:  Dyspnea on exertion    Rx / DC Orders ED Discharge Orders     None         Janeece Fitting, PA-C 02/12/23 1919    Sherwood Gambler, MD 02/16/23 1622

## 2023-02-12 NOTE — ED Triage Notes (Signed)
Pt arrives pov, steady gait to triage, c/o shob with exertion x 1 month. F/U today with pcp , referred by pcp for hgb and XR. Pt denies CP

## 2023-02-12 NOTE — ED Notes (Addendum)
Pulse Ox reading during ambulation remained 97-98 % RA Hr remained 90 bpm.

## 2023-02-13 ENCOUNTER — Telehealth: Payer: Self-pay

## 2023-02-13 ENCOUNTER — Encounter: Payer: Self-pay | Admitting: Family Medicine

## 2023-02-13 DIAGNOSIS — G4731 Primary central sleep apnea: Secondary | ICD-10-CM

## 2023-02-13 DIAGNOSIS — G4739 Other sleep apnea: Secondary | ICD-10-CM

## 2023-02-13 NOTE — Telephone Encounter (Signed)
     Patient  visit on 3/26  at [facility]   Have you been able to follow up with your primary care physician? Yes   The patient was or was not able to obtain any needed medicine or equipment. Yes   Are there diet recommendations that you are having difficulty following? Na   Patient expresses understanding of discharge instructions and education provided has no other needs at this time.  Yes     [facility] [phone] [address], [place], [place] [address] Phone: [phone] Email: [email or web address]

## 2023-02-17 NOTE — Assessment & Plan Note (Signed)
Ongoing worsening dyspnea over the last month. No clear inciting event. Has seen cardiology, repeat CT cardiac w/ calcium score of 0. Cards added metoprolol as anti-anginal although patient has not seen any benefit. Wonder if subacute PE given CP w/ DOE. Precepted with attending Dr. Owens Shark, who recommended ED evaluation. Will send patient to ED. Pending ED work up, would plan for pulmonary function testing with Dr. Valentina Lucks here in clinic. Patient voices understanding and will go to ED.

## 2023-02-20 ENCOUNTER — Ambulatory Visit: Payer: Medicare PPO | Admitting: Pharmacist

## 2023-02-20 ENCOUNTER — Encounter: Payer: Self-pay | Admitting: Family Medicine

## 2023-02-20 ENCOUNTER — Other Ambulatory Visit (HOSPITAL_COMMUNITY): Payer: Self-pay

## 2023-02-20 ENCOUNTER — Other Ambulatory Visit: Payer: Self-pay | Admitting: Family Medicine

## 2023-02-20 ENCOUNTER — Telehealth: Payer: Self-pay

## 2023-02-20 VITALS — BP 136/68 | HR 77 | Wt 180.0 lb

## 2023-02-20 DIAGNOSIS — Z794 Long term (current) use of insulin: Secondary | ICD-10-CM

## 2023-02-20 DIAGNOSIS — R0602 Shortness of breath: Secondary | ICD-10-CM | POA: Diagnosis not present

## 2023-02-20 DIAGNOSIS — R0600 Dyspnea, unspecified: Secondary | ICD-10-CM | POA: Insufficient documentation

## 2023-02-20 DIAGNOSIS — F32A Depression, unspecified: Secondary | ICD-10-CM

## 2023-02-20 DIAGNOSIS — E1142 Type 2 diabetes mellitus with diabetic polyneuropathy: Secondary | ICD-10-CM | POA: Diagnosis not present

## 2023-02-20 MED ORDER — INSULIN DEGLUDEC 100 UNIT/ML ~~LOC~~ SOPN: 42.0000 [IU] | PEN_INJECTOR | Freq: Every day | SUBCUTANEOUS | 3 refills | Status: DC

## 2023-02-20 MED ORDER — INSULIN ASPART (W/NIACINAMIDE) 100 UNIT/ML ~~LOC~~ SOPN: PEN_INJECTOR | SUBCUTANEOUS | 3 refills | Status: DC

## 2023-02-20 MED ORDER — BUDESONIDE-FORMOTEROL FUMARATE 80-4.5 MCG/ACT IN AERO: 2.0000 | INHALATION_SPRAY | Freq: Two times a day (BID) | RESPIRATORY_TRACT | 3 refills | Status: DC

## 2023-02-20 NOTE — Progress Notes (Signed)
S:     Chief Complaint  Patient presents with  . Medication Management    Pulmonary function test   Cassandra Reid is a 67 y.o. female who presents for lung function evaluation.  PMH is significant for Shortness of Breath.  Patient was referred and last seen by Primary Care Provider, Dr. Jeani Hawking, on 12/07/22.  Last time pharmacy team spoke with patient was 09/13/22 over telephone. Tresiba (insulin degludec) was reduced from 45 to 40 units once daily. Fiasp (insulin aspart) was unchanged at 15 units prior to each of 3 meals daily.  Patient reports breathing has been difficult. She reports that she is out of breath often, even during low exertion ADLs. Patient does not report any significant anxiety events that would attribute to breathlessness. Smoked 1/2 ppd for 7 years, quit 32 years ago. Patient reports they have Dexcom G7 CGM and use that to monitor sugar levels.  Patient is adherent to medications. Current T2DM medications: Tresiba (insulin degludec) from 46 to 42 units daily, Fiasp (insulin aspart) 15-15-12 units before meals. If BG readings are >180, increase the dose by 2 units  Current COPD medications: None  O: Review of Systems  Respiratory:  Positive for shortness of breath.   All other systems reviewed and are negative.   Physical Exam Constitutional:      Appearance: Normal appearance.  Pulmonary:     Effort: Respiratory distress present.  Neurological:     Mental Status: She is alert.  Psychiatric:        Mood and Affect: Mood normal.        Behavior: Behavior normal.        Thought Content: Thought content normal.        Judgment: Judgment normal.    Vitals:   02/20/23 1032  BP: 136/68  Pulse: 77  SpO2: 96%    mMRC score= *** >2 CAT score= *** See Documentation Flowsheet - CAT/COPD for complete symptom scoring.  See "scanned report" or Documentation Flowsheet (discrete results - PFTs) for Spirometry results. Patient provided good effort while  attempting spirometry.   Lung Age = 76 Albuterol Neb  Lot# ***     Exp. ***  Date of Download: 02/20/23 % Time CGM is active: 99.8% Average Glucose: 149 mg/dL Glucose Management Indicator: 6.9% Glucose Variability: 22.9% (goal <36%) Time in Goal:  - Time in range 70-180: 83% - Time above range: 17% (16% High, 1% Very High) - Time below range: 0% Observed patterns: patient experiences lows quickly after experiencing highs, may be attributable to overcorrection of mealtime insulin.   A/P: PFT Patient has been experiencing shortness of breath for some time, currently not taking any medication.  Spirometry evaluation reveals Mild restrictive lung disease. Post nebulized albuterol tx revealed Significant pre-post change.    -begin symbicort (budesonide/formoterol 80/4.5 mcg  2 puffs BID and 2 puffs as needed for additional symptoms  -Reviewed results of pulmonary function tests.  Pt verbalized understanding of results and education.   -Initiated symbicort (budesonide/formoterol) 2 puffs BID and 2 puffs PRN for additional symptoms. Patient was educated on proper use and adverse effects (thrush). Patient was advised to rinse mouth out after use to avoid thrush.  T2DM -Reviewed and discussed printed CGM report -Decreased dose of Tresiba (insulin degludec) from 46 to 42 units daily -Adjusted dose of Fiasp (insulin aspart) from 15-15-12 units to 14-12-10 units before meals. If BG readings are >180, increase the dose by 2 units - Verbalized goal of avoiding lows  and discussed plan if she does experience lows.  Written patient instructions provided.   Total time in face to face counseling 40 minutes.    Follow-up:  Pharmacist: Patient seen with Gena Fray, PharmD PGY-1 Pharmacy Resident and Estelle June, PharmD Candidate.

## 2023-02-20 NOTE — Telephone Encounter (Signed)
Patient calls nurse line requesting to speak with Rosendo Gros regarding possible medication assistance for Symbicort.   This was prescribed by Dr. Valentina Lucks today and patient states that she is unable to afford this.   Will forward to [place] and pharmacy team.   Thanks.   Talbot Grumbling, RN

## 2023-02-20 NOTE — Patient Instructions (Addendum)
It was great seeing you today!  Here are the changes we are making to your medications:  Decreased dose of Tresiba (insulin degludec) from 46 to 42 units daily  Adjusted dose of Fiasp (insulin aspart) from 15-15-12 units to 14-12-10 units before meals. If blood glucose readings are >180 mg/dL, increase the dose by 2 units  START Symbicort (budesonide/formoterol) 2 puffs 2 times daily, and 2 puffs as needed for any other instances of symptoms.

## 2023-02-21 ENCOUNTER — Telehealth: Payer: Self-pay | Admitting: Pharmacist

## 2023-02-21 ENCOUNTER — Telehealth: Payer: Self-pay | Admitting: Gastroenterology

## 2023-02-21 ENCOUNTER — Other Ambulatory Visit (HOSPITAL_COMMUNITY): Payer: Self-pay

## 2023-02-21 ENCOUNTER — Encounter (HOSPITAL_BASED_OUTPATIENT_CLINIC_OR_DEPARTMENT_OTHER): Payer: Self-pay

## 2023-02-21 ENCOUNTER — Encounter (HOSPITAL_BASED_OUTPATIENT_CLINIC_OR_DEPARTMENT_OTHER): Payer: Self-pay | Admitting: Family

## 2023-02-21 ENCOUNTER — Telehealth (INDEPENDENT_AMBULATORY_CARE_PROVIDER_SITE_OTHER): Payer: Medicare PPO | Admitting: Family

## 2023-02-21 ENCOUNTER — Encounter: Payer: Self-pay | Admitting: Pharmacist

## 2023-02-21 VITALS — BP 138/58 | HR 70 | Ht 61.0 in | Wt 178.0 lb

## 2023-02-21 DIAGNOSIS — Q245 Malformation of coronary vessels: Secondary | ICD-10-CM

## 2023-02-21 DIAGNOSIS — R002 Palpitations: Secondary | ICD-10-CM

## 2023-02-21 DIAGNOSIS — I10 Essential (primary) hypertension: Secondary | ICD-10-CM

## 2023-02-21 DIAGNOSIS — H548 Legal blindness, as defined in USA: Secondary | ICD-10-CM | POA: Diagnosis not present

## 2023-02-21 DIAGNOSIS — R5381 Other malaise: Secondary | ICD-10-CM

## 2023-02-21 MED ORDER — BUDESONIDE-FORMOTEROL FUMARATE 80-4.5 MCG/ACT IN AERO: 2.0000 | INHALATION_SPRAY | Freq: Two times a day (BID) | RESPIRATORY_TRACT | 12 refills | Status: DC

## 2023-02-21 MED ORDER — ALBUTEROL SULFATE HFA 108 (90 BASE) MCG/ACT IN AERS: 2.0000 | INHALATION_SPRAY | Freq: Two times a day (BID) | RESPIRATORY_TRACT | 1 refills | Status: DC

## 2023-02-21 MED ORDER — METOPROLOL SUCCINATE ER 50 MG PO TB24: 50.0000 mg | ORAL_TABLET | Freq: Every day | ORAL | 3 refills | Status: DC

## 2023-02-21 NOTE — Patient Instructions (Signed)
Medication Instructions:  Your physician has recommended you make the following change in your medication:  INCREASE Metoprolol to 50mg  daily  *If you need a refill on your cardiac medications before your next appointment, please call your pharmacy*   Follow-Up: At [facility], you and your health needs are our priority.  As part of our continuing mission to provide you with exceptional heart care, we have created designated Provider Care Teams.  These Care Teams include your primary Cardiologist (physician) and Advanced Practice Providers (APPs -  Physician Assistants and Nurse Practitioners) who all work together to provide you with the care you need, when you need it.  We recommend signing up for the patient portal called "MyChart".  Sign up information is provided on this After Visit Summary.  MyChart is used to connect with patients for Virtual Visits (Telemedicine).  Patients are able to view lab/test results, encounter notes, upcoming appointments, etc.  Non-urgent messages can be sent to your provider as well.   To learn more about what you can do with MyChart, go to [email or web address].    Your next appointment:   In August as scheduled with Dr. Stanford Breed  Other Instructions  To prevent palpitations: Make sure you are adequately hydrated.  Avoid and/or limit caffeine containing beverages like soda or tea. Exercise regularly.  Manage stress well. Some over the counter medications can cause palpitations such as Benadryl, AdvilPM, TylenolPM. Regular Advil or Tylenol do not cause palpitations.

## 2023-02-21 NOTE — Telephone Encounter (Signed)
Following visit 02/20/2023, determined that Symbicort was not agent of choice and was unaffordable for patient.  Alternatives were not ideal.   Plan to attempt use of Breyna (identical ingredients to Symbicort) through manufacture assistance program to be coordinated by [employer], CPhT.   Contact patient to discuss inhaler plan.  In the interval period until we can obtain Breyna Inhaler, we initiated an albuterol inhaler

## 2023-02-21 NOTE — Progress Notes (Signed)
Virtual Visit via Video Note   Because of Cassandra Reid co-morbid illnesses, she is at least at moderate risk for complications without adequate follow up.  This format is felt to be most appropriate for this patient at this time.  All issues noted in this document were discussed and addressed.  A limited physical exam was performed with this format.  Please refer to the patient's chart for her consent to telehealth for [facility].       Date:  02/24/2023   ID:  Cassandra Reid, DOB 11-24-1955, MRN [id number] The patient was identified using 2 identifiers.  Patient Location: Home Provider Location: Office/Clinic   PCP:  Fayette Pho, MD   [facility] Providers Cardiologist:  Olga Millers, MD     Evaluation Performed:  Follow-Up Visit  Chief Complaint:  Follow up after cardiac CTA  History of Present Illness:    Cassandra Reid is a 67 y.o. female with cerebral aneurysm w/o rupture, hypertension, PAT, chronic left maxillary sinusitis, OSA, GERD, fatty liver, CKD3, DM2, legally blind, obesity, depression, GAD, fibromyalgia, chronic pain.  Last seen 01/11/23.   Admitted to the hospital May 2021.  Troponins normal.  Echo LVEF 65-70%., mild LVH, grade 1 diastolic dysfunction, mild left atrial enlargement, trace MR. 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 Saw Dr. Jens Som 05/2021 at which time her lisinopril was increased to 20 mg daily.   Seen 06/22/2022 by Sharlene Dory, NP.  She noted exertional dyspnea, palpitations, orthostatic dizziness over the past couple of months.   Subsequent monitor 07/18/2022 predominantly normal sinus rhythm, 17 runs of SVT longest 10 seconds, 3 beats of NSVT.  Echocardiogram 08/03/2022 LVEF 60 to 65%, grade 1 diastolic dysfunction, RV normal, no significant valvular abnormalities.   Seen 01/11/23 due to chest pain. Subsequent cardiac CTA 01/22/23 coronary calcium score of 0, known mid myocardial  bridge, aortic atherosclerosis.   Follow up via video visit today as has been having some stomach issues. She does have known IBS and gastric reflux. Tells me her shortness of breath is unchanged with exertion. Was reassured by CT scan. PCP has completed PFTs revealing mild restrictive lung disease and inhaler initiated.  Interested in increasing her physical activity but limited by inability to drive to exercise facility due to legal blindness as [employer] cost prohibitive. BP at home most often 130s/60s. Heart rate at hom in the 70s - also monitors heart rate with her smart watch. Notes she feels her heart racing with any amount of exertion. Wearing compression stockings with improvement in edema - taking Furosemide twice per week.   Past Medical History:  Diagnosis Date   Acute pain of left shoulder 04/03/2022   Allergic rhinitis 09/26/2018   Anosmia    Anosmia    Arthritis    Asthma    Balance problems 09/22/2021   C. difficile colitis 07/18/2018   Cerebral aneurysm without rupture    Chest pain 03/26/2020   Chronic pain syndrome 04/23/2019   CKD stage 3 due to type 2 diabetes mellitus 04/15/2019   DDD (degenerative disc disease), cervical 04/16/2019   Encephalomalacia on imaging study 08/25/2020   Brain MRI 08/25/21: Few scattered areas of encephalomalacia and gliosis within the overlying left frontal lobe most likely postoperative in nature.   Fatty liver 05/31/2020   Fibromyalgia    Former smoker    Generalized anxiety disorder    GERD (gastroesophageal reflux disease)    Heart murmur  History of chicken pox    History of colon polyps    Hyperlipidemia associated with type 2 diabetes mellitus 07/18/2018   Hypertension associated with diabetes 07/18/2018   IBS (irritable bowel syndrome)    Incontinence 09/22/2021   Bowel and bladder.    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   Type 2 diabetes mellitus with diabetic polyneuropathy, with long-term current use of insulin 07/18/2018   has dexcom G7  - sensor left upper arm   Past Surgical History:  Procedure Laterality Date   ABDOMINAL HYSTERECTOMY  2012   APPENDECTOMY  1972   CEREBRAL ANEURYSM REPAIR Left 2004   Aneurysm clip in place near the level of the left ICA on Brain MRI 08/2020. Also coiling performed   CHOLECYSTECTOMY  2015   COLONOSCOPY  11/24/2020   in [place]   CRANIOTOMY Left    left pterional craniotomy   ETHMOIDECTOMY Left 11/23/2022   Procedure: LEFT ANTERIOR ETHMOIDECTOMY;  Surgeon: Laren Boom, DO;  Location: [facility] OR;  Service: ENT;  Laterality: Left;   MAXILLARY ANTROSTOMY Left 11/23/2022   Procedure: MAXILLARY ANTROSTOMY;  Surgeon: Laren Boom, DO;  Location: [facility] OR;  Service: ENT;  Laterality: Left;   MENISCUS REPAIR Bilateral    NASAL TURBINATE REDUCTION Bilateral 11/23/2022   Procedure: Bilateral turbinate reduction;  Surgeon: Laren Boom, DO;  Location: [facility] OR;  Service: ENT;  Laterality: Bilateral;   SINUS ENDO W/FUSION Left 11/23/2022   Procedure: Functional endoscopic sinus surgery with navigation.;  Surgeon: Laren Boom, DO;  Location: [facility] OR;  Service: ENT;  Laterality: Left;   UPPER GI ENDOSCOPY  11/24/2020   in [place]     Current Meds  Medication Sig   albuterol (VENTOLIN HFA) 108 (90 Base) MCG/ACT inhaler Inhale 2 puffs into the lungs 2 (two) times daily. Plus 2 puffs prn   Ascorbic Acid (VITAMIN C ADULT GUMMIES PO) Take 1 each by mouth daily in the afternoon.    budesonide-formoterol (BREYNA) 80-4.5 MCG/ACT inhaler Inhale 2 puffs into the lungs in the morning and at bedtime. And PRN shortness of breath.   dapagliflozin propanediol (FARXIGA) 10 MG TABS tablet Take 1 tablet (10 mg total) by mouth daily.   dicyclomine (BENTYL) 20 MG tablet Take 1 tablet (20 mg total) by mouth 2 (two) times daily as needed for spasms.   DULoxetine (CYMBALTA) 60 MG capsule Take 1 capsule (60 mg total) by mouth daily.   ezetimibe (ZETIA) 10 MG tablet Take 1 tablet (10 mg total) by mouth daily.   famotidine (PEPCID) 20 MG tablet Take 1 tablet (20 mg total) by mouth daily.   FIBER SELECT GUMMIES PO Take 1 tablet by mouth daily in the afternoon.   fluticasone (FLONASE) 50 MCG/ACT nasal spray Place 1 spray into both nostrils daily as needed for allergies or rhinitis.   furosemide (LASIX) 40 MG tablet Take 1 tablet (40 mg total) by mouth as needed for edema. (Patient taking differently: Take  40 mg by mouth daily as needed (ankle swelling.).)   gabapentin (NEURONTIN) 600 MG tablet TAKE 0.5-1 TABLET (300-600 MG TOTAL) BY MOUTH 3 (THREE) TIMES DAILY. (Patient taking differently: Take 600 mg by mouth 2 (two) times daily.)   insulin aspart (FIASP) 100 UNIT/ML FlexTouch Pen Inject 14-16 Units into the skin daily with breakfast AND 12-14 Units daily with lunch AND 10-12 Units daily with supper. This is a patient assistance medication. Patient may not be approved and/or have medication. Please ask and verify when performing med review..   insulin degludec (TRESIBA) 100 UNIT/ML FlexTouch Pen Inject 42 Units into the skin daily. This is a patient assistance medication. Patient may not be approved and/or have medication. Please ask and verify when performing med review.   lisinopril (ZESTRIL) 40 MG tablet Take 1 tablet (40 mg total) by mouth daily. (Patient taking differently: Take 40 mg by mouth in the morning.)   loratadine (CLARITIN) 10 MG tablet TAKE 1 TABLET EVERY DAY   LORazepam (ATIVAN) 0.5  MG tablet TAKE 1 TABLET EVERY 8 HOURS AS NEEDED FOR ANXIETY   Magnesium 500 MG TABS Take 500 mg by mouth daily in the afternoon.   metoprolol succinate (TOPROL-XL) 50 MG 24 hr tablet Take 1 tablet (50 mg total) by mouth daily. Take with or immediately following a meal.   Multiple Vitamin (MULTIVITAMIN WITH MINERALS) TABS tablet Take 1 tablet by mouth in the morning.   Oyster Shell (OYSTER CALCIUM) 500 MG TABS tablet Take 500 mg of elemental calcium by mouth daily in the afternoon.   pantoprazole (PROTONIX) 40 MG tablet TAKE 1 TABLET EVERY DAY   potassium chloride SA (KLOR-CON M) 20 MEQ tablet TAKE 1 TABLET AS NEEDED (TAKE AS DIRECTED WITH LASIX AS NEEDED FOR EDEMA)   Semaglutide, 2 MG/DOSE, (OZEMPIC, 2 MG/DOSE,) 8 MG/3ML SOPN Inject 2 mg into the skin once a week.   sucralfate (CARAFATE) 1 g tablet TAKE 1 TABLET FOUR TIMES DAILY, WITH MEALS AND AT BEDTIME (OKAY TO TRIAL OFF AS INSTRUCTED) (Patient taking differently: Take 1 g by mouth in the morning and at bedtime.)   traZODone (DESYREL) 50 MG tablet TAKE 1/2 TABLET BY MOUTH AT BEDTIME AS NEEDED FOR SLEEP.   [DISCONTINUED] metoprolol succinate (TOPROL XL) 25 MG 24 hr tablet Take 1 tablet (25 mg total) by mouth daily.     Allergies:   Patient has no known allergies.   Social History   Tobacco Use   Smoking status: Former    Packs/day: 1.00    Years: 3.00    Additional pack years: 0.00    Total pack years: 3.00    Types: Cigarettes    Quit date: 06/1967    Years since quitting: 55.7    Passive exposure: Past   Smokeless tobacco: Never  Vaping Use   Vaping Use: Never used  Substance Use Topics   Alcohol use: Never   Drug use: Not Currently     Family Hx: The patient's family history includes Alcohol abuse in her maternal grandfather and mother; Breast cancer in her sister; COPD in her father; Cancer in her maternal grandfather, maternal grandmother, paternal grandfather, and paternal grandmother; Diabetes in her father, mother,  sister, and sister; Early death in her father; Heart disease in her father; Hyperlipidemia in her father; Hypertension in her father, mother, sister, and sister; Kidney disease in her mother and sister.  ROS:   Please see the history of present illness.     All other systems reviewed and  are negative.   Prior CV studies:   The following studies were reviewed today:  Cardiac Studies & Procedures       ECHOCARDIOGRAM  ECHOCARDIOGRAM COMPLETE 08/03/2022  Narrative ECHOCARDIOGRAM REPORT    Patient Name:   Cassandra Reid Date of Exam: 08/03/2022 Medical Rec #:  [id number]      Height:       61.0 in Accession #:    [id number]     Weight:       175.0 lb Date of Birth:  January 24, 1956     BSA:          1.785 m Patient Age:    65 years       BP:           136/60 mmHg Patient Gender: F              HR:           71 bpm. Exam Location:  [address]  Procedure: 2D Echo, Cardiac Doppler, Color Doppler and Strain Analysis  Indications:    R06.09 Dyspnea on exertion  History:        Patient has prior history of Echocardiogram examinations, most recent 03/26/2020. Arrythmias:Atrial tachycardia.; Risk Factors:Hypertension, Sleep Apnea, Diabetes and Former Smoker. Orthostatic hypotension. CKD stage 3. Obesity.  Sonographer:    Jorje Guild BS, RDCS Referring Phys: [id number] [name]  IMPRESSIONS   1. Left ventricular ejection fraction, by estimation, is 60 to 65%. The left ventricle has normal function. The left ventricle has no regional wall motion abnormalities. Left ventricular diastolic parameters are consistent with Grade I diastolic dysfunction (impaired relaxation). The average left ventricular global longitudinal strain is -20.5 %. The global longitudinal strain is normal. 2. Right ventricular systolic function is normal. The right ventricular size is normal. 3. The mitral valve is normal in structure. No evidence of mitral valve regurgitation. No evidence of mitral stenosis. 4.  The aortic valve is normal in structure. Aortic valve regurgitation is not visualized. No aortic stenosis is present. 5. The inferior vena cava is normal in size with greater than 50% respiratory variability, suggesting right atrial pressure of 3 mmHg.  Comparison(s): No significant change from prior study. Prior images reviewed side by side. 03/26/20 EF 65-70%.  FINDINGS Left Ventricle: Left ventricular ejection fraction, by estimation, is 60 to 65%. The left ventricle has normal function. The left ventricle has no regional wall motion abnormalities. The average left ventricular global longitudinal strain is -20.5 %. The global longitudinal strain is normal. The left ventricular internal cavity size was normal in size. There is no left ventricular hypertrophy. Left ventricular diastolic parameters are consistent with Grade I diastolic dysfunction (impaired relaxation). Normal left ventricular filling pressure.  Right Ventricle: The right ventricular size is normal. No increase in right ventricular wall thickness. Right ventricular systolic function is normal.  Left Atrium: Left atrial size was normal in size.  Right Atrium: Right atrial size was normal in size.  Pericardium: There is no evidence of pericardial effusion.  Mitral Valve: The mitral valve is normal in structure. No evidence of mitral valve regurgitation. No evidence of mitral valve stenosis.  Tricuspid Valve: The tricuspid valve is normal in structure. Tricuspid valve regurgitation is not demonstrated. No evidence of tricuspid stenosis.  Aortic Valve: The aortic valve is normal in structure. Aortic valve regurgitation is not visualized. No aortic stenosis is present.  Pulmonic Valve: The pulmonic valve was normal in structure. Pulmonic valve regurgitation is not visualized. No evidence of  pulmonic stenosis.  Aorta: The aortic root is normal in size and structure.  Venous: The inferior vena cava is normal in size with greater  than 50% respiratory variability, suggesting right atrial pressure of 3 mmHg.  IAS/Shunts: No atrial level shunt detected by color flow Doppler.   LEFT VENTRICLE PLAX 2D LVIDd:         4.30 cm   Diastology LVIDs:         2.50 cm   LV e' medial:    7.05 cm/s LV PW:         0.80 cm   LV E/e' medial:  7.7 LV IVS:        0.70 cm   LV e' lateral:   6.89 cm/s LVOT diam:     2.10 cm   LV E/e' lateral: 7.8 LV SV:         64 LV SV Index:   36        2D Longitudinal Strain LVOT Area:     3.46 cm  2D Strain GLS (A2C):   -19.2 % 2D Strain GLS (A3C):   -20.9 % 2D Strain GLS (A4C):   -21.4 % 2D Strain GLS Avg:     -20.5 %  RIGHT VENTRICLE            IVC RV Basal diam:  2.80 cm    IVC diam: 1.80 cm RV S prime:     8.11 cm/s TAPSE (M-mode): 1.6 cm  LEFT ATRIUM             Index        RIGHT ATRIUM           Index LA diam:        3.70 cm 2.07 cm/m   RA Pressure: 3.00 mmHg LA Vol (A2C):   35.5 ml 19.89 ml/m  RA Area:     6.77 cm LA Vol (A4C):   26.0 ml 14.57 ml/m  RA Volume:   11.00 ml  6.16 ml/m LA Biplane Vol: 32.1 ml 17.99 ml/m AORTIC VALVE LVOT Vmax:   95.60 cm/s LVOT Vmean:  64.000 cm/s LVOT VTI:    0.186 m  AORTA Ao Root diam: 3.20 cm Ao Asc diam:  3.10 cm  MITRAL VALVE               TRICUSPID VALVE Estimated RAP:  3.00 mmHg MV Decel Time: 218 msec MV E velocity: 54.00 cm/s  SHUNTS MV A velocity: 82.30 cm/s  Systemic VTI:  0.19 m MV E/A ratio:  0.66        Systemic Diam: 2.10 cm  Mihai Croitoru MD Electronically signed by Thurmon Fair MD Signature Date/Time: 08/03/2022/3:09:33 PM    Final    MONITORS  LONG TERM MONITOR (3-14 DAYS) 07/20/2022  Narrative Patch Wear Time:  10 days and 0 hours (2023-08-09T16:38:26-0400 to 2023-08-19T16:58:30-0400)  Patient had a min HR of 55 bpm, max HR of 210 bpm, and avg HR of 75 bpm. Predominant underlying rhythm was Sinus Rhythm. 17 Supraventricular Tachycardia runs occurred, the run with the fastest interval lasting 5 beats with  a max rate of 210 bpm, the longest lasting 10.0 secs with an avg rate of 127 bpm. Some episode of Supraventricular Tachycardia may be possible Atrial Tachycardia with variable block. Supraventricular Tachycardia was detected within +/- 45 seconds of symptomatic patient event(s). Isolated SVEs were rare (<1.0%), SVE Couplets were rare (<1.0%), and SVE Triplets were rare (<1.0%). Isolated VEs were rare (<1.0%, 835), VE Couplets  were rare (<1.0%, 2), and VE Triplets were rare (<1.0%, 1).   Sinus bradycardia, NSR, sinus tachycardia; PACs, brief runs of PAT, PVCs and 3 beats NSVT Olga Millers   CT SCANS  CT CORONARY MORPH W/CTA COR W/SCORE 01/23/2023  Addendum 01/23/2023 10:10 PM ADDENDUM REPORT: 01/23/2023 22:08  EXAM: OVER-READ INTERPRETATION  CT CHEST  The following report is an over-read performed by radiologist Dr. Alcide Clever of [facility] on 01/23/2023. This over-read does not include interpretation of cardiac or coronary anatomy or pathology. The coronary calcium score/coronary CTA interpretation by the cardiologist is attached.  COMPARISON:  03/27/2019  FINDINGS: Cardiovascular: Atherosclerotic calcifications of the thoracic aorta are noted. No evidence of pulmonary emboli.  Mediastinum/Nodes: There are no enlarged lymph nodes within the visualized mediastinum.The esophagus is within normal limits.  Lungs/Pleura: There is no pleural effusion. The visualized lungs appear clear.  Upper abdomen: No significant findings in the visualized upper abdomen.  Musculoskeletal/Chest wall: No chest wall mass or suspicious osseous findings within the visualized chest.  IMPRESSION: Aortic Atherosclerosis (ICD10-I70.0).  No other significant extracardiac findings within the visualized chest.   Electronically Signed By: Alcide Clever M.D. On: 01/23/2023 22:08  Narrative CLINICAL DATA:  67 year old with chest pain.  EXAM: Cardiac/Coronary  CTA  TECHNIQUE: The  patient was scanned on a [employer].  FINDINGS: A 80 kV prospective scan was triggered in the descending thoracic aorta at 111 HU's. Axial non-contrast 3 mm slices were carried out through the heart. The data set was analyzed on a dedicated work station and scored using the Agatson method. Gantry rotation speed was 250 msecs and collimation was .6 mm. 0.8 mg of sl NTG was given. The 3D data set was reconstructed in 5% intervals of the 67-82 % of the R-R cycle. Diastolic phases were analyzed on a dedicated work station using MPR, MIP and VRT modes. The patient received 80 cc of contrast.  Image quality: good  Aorta: Normal size (29 mm ascending). Aortic atherosclerosis. No dissection.  Aortic Valve:  Trileaflet.  No calcifications.  Coronary Arteries:  Normal coronary origin.  Right dominance.  RCA is a large co-dominant artery that gives rise to PDA and PLA. There is no plaque.  Left main is a large artery that gives rise to LAD and LCX arteries.  LAD is a large vessel that has no plaque. Mid LAD myocardial bridge.  LCX is a co-dominant artery.  There is no plaque.  OM1-3, mid PDA branch, small caliber, no plaque  Ramus: Small caliber, no plaque  Other findings:  Normal pulmonary vein drainage into the left atrium.  Normal left atrial appendage without a thrombus.  Normal size of the pulmonary artery.  Please see radiology report for non cardiac findings.  IMPRESSION: 1. Coronary calcium score of 0.  2. Normal coronary origin with co dominance.  3. No evidence of CAD.  4. Mid LAD myocardial bridge.  CAD-RADS 0. No evidence of CAD (0%). Consider non-atherosclerotic causes of chest pain.  Electronically Signed: By: Donato Schultz M.D. On: 01/22/2023 13:27   CT SCANS  CT CORONARY MORPH W/CTA COR W/SCORE 03/28/2020  Addendum 03/28/2020  9:19 AM ADDENDUM REPORT: 03/28/2020 09:17  EXAM: OVER-READ INTERPRETATION  CT CHEST  The following report  is an over-read performed by radiologist Dr. Royal Piedra [facility] on 03/28/2020. This over-read does not include interpretation of cardiac or coronary anatomy or pathology. The coronary calcium score and cardiac CTA interpretation by the cardiologist is  attached.  COMPARISON:  None.  FINDINGS: Within the visualized portions of the thorax there are no suspicious appearing pulmonary nodules or masses, there is no acute consolidative airspace disease, no pleural effusions, no pneumothorax and no lymphadenopathy. Visualized portions of the upper abdomen demonstrates diffuse low attenuation throughout the visualized hepatic parenchyma, indicative of hepatic steatosis. There are no aggressive appearing lytic or blastic lesions noted in the visualized portions of the skeleton.  IMPRESSION: 1. Hepatic steatosis.   Electronically Signed By: Trudie Reed M.D. On: 03/28/2020 09:17  Narrative HISTORY: 67 yo female with chest pain/anginal equiv, 56yr CHD risk 10-20%  EXAM: Cardiac/Coronary CTA  TECHNIQUE: The patient was scanned on a [employer].  PROTOCOL: A 120 kV prospective scan was triggered in the descending thoracic aorta at 111 HU's. Axial non-contrast 3 mm slices were carried out through the heart. The data set was analyzed on a dedicated work station and scored using the Agatson method. Gantry rotation speed was 250 msecs and collimation was .6 mm. Beta blockade and 0.8 mg of sl NTG was given. The 3D data set was reconstructed in 5% intervals of the 67-82 % of the R-R cycle. Diastolic phases were analyzed on a dedicated work station using MPR, MIP and VRT modes. The patient received 80mL OMNIPAQUE IOHEXOL 350 MG/ML SOLN of contrast.  FINDINGS: Quality: Good (HR 72)  Coronary calcium score: The patient's coronary artery calcium score is 0, which places the patient in the 0 percentile.  Coronary arteries: Normal coronary origins.   Right dominance.  Right Coronary Artery: Dominant vessel.  No significant stenosis.  Left Main Coronary Artery: Normal. Bifurcates into the LAD and LCx arteries.  Left Anterior Descending Coronary Artery: Tortuous vessel that does not reach the apex. No stenosis. 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  Ramus Intermedius: Moderate-sized branch without stenosis.  Left Circumflex Artery: Large caliber AV groove vessel without stenosis.  Aorta: Normal size, 28 mm at the mid ascending aorta (level of the PA bifurcation) measured double oblique. No calcifications. No dissection.  Aortic Valve: Trileaflet.  No calcifications.  Other findings:  Normal pulmonary vein drainage into the left atrium.  Normal left atrial appendage without a thrombus.  Normal size of the pulmonary artery.  IMPRESSION: 1. No evidence of CAD, CADRADS = 0. 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  2. Coronary calcium score of 0.  3. Normal coronary origin with right dominance.  Electronically Signed: By: Chrystie Nose M.D. On: 03/26/2020 17:28           Labs/Other Tests and Data Reviewed:    EKG:  No ECG reviewed.  Recent Labs: 06/26/2022: TSH 1.500 11/23/2022: ALT 49 01/11/2023: Magnesium 2.2 02/12/2023: B Natriuretic Peptide 57.0; BUN 17; Creatinine, Ser 1.06; Hemoglobin 16.6; Platelets 188; Potassium 3.9; Sodium 139   Recent Lipid Panel Lab Results  Component Value Date/Time   CHOL 184 06/26/2022 08:24 AM   TRIG 266 (H) 06/26/2022 08:24 AM   HDL 48 06/26/2022 08:24 AM   CHOLHDL 3.8 06/26/2022 08:24 AM   CHOLHDL 2.9 07/31/2019 01:39 PM   LDLCALC 92 06/26/2022 08:24 AM   LDLCALC 48 07/31/2019 01:39 PM    Wt Readings from Last 3 Encounters:  02/21/23 178 lb (80.7 kg)  02/20/23 180 lb (81.6 kg)  02/12/23 176 lb (79.8 kg)          Objective:    Vital Signs:  BP (!) 138/58  Pulse 70   Ht 5\' 1"  (1.549 m)   Wt  178 lb (80.7 kg)   BMI 33.63 kg/m    VITAL SIGNS:  reviewed RESPIRATORY:  normal respiratory effort, symmetric expansion CARDIOVASCULAR:  no peripheral edema  ASSESSMENT & PLAN:    Exertional dyspnea / Physical deconditioning - Cardiac CTA 12/2022 calcium score 0. Echo 07/2022 normal LVEF, gr1DD, no valvular abnormalities. PFT with mild restrictipn which PCP is treating with inhaler. Anticipate deconditioning also contributory - will refer to home health for strengthening and endurance training as she is unable to drive due to being legally blind.    Palpitations - Notable with minimal amounts of activity. Increase Toprol to 50mg  QD.  HTN - Discussed to monitor BP at home at least 2 hours after medications and sitting for 5-10 minutes. Increase Toprol, as above.   CKDIII - Careful titration of diuretic and antihypertensive.  Continue to follow with PCP.   Aortic atherosclerosis / HLD - Continue Zetia.   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 Continue Toprol, as above. Avoid nitrates due to myocardial bridge.             Time:   Today, I have spent 12 minutes with the patient with telehealth technology discussing the above problems.     Medication Adjustments/Labs and Tests Ordered: Current medicines are reviewed at length with the patient today.  Concerns regarding medicines are outlined above.   Tests Ordered: Orders Placed This Encounter  Procedures   Ambulatory referral to Home Health    Medication Changes: Meds ordered this encounter  Medications   metoprolol succinate (TOPROL-XL) 50 MG 24 hr tablet    Sig: Take 1 tablet (50 mg total) by mouth daily. Take with or immediately following a meal.    Dispense:  90 tablet    Refill:  3    Discontinue 25mg  dose    Order Specific Question:   Supervising Provider    Answer:   Jodelle Red [[id number]]    Follow Up:  In Person in 4 month(s)  Signed, Alver Sorrow, NP  02/24/2023 4:47 PM    [facility]

## 2023-02-21 NOTE — Telephone Encounter (Signed)
Virtual is fine.   Adonai Selsor S Kammie Scioli, NP  

## 2023-02-21 NOTE — Telephone Encounter (Signed)
Okay for today's appointment to be virtual

## 2023-02-21 NOTE — Telephone Encounter (Signed)
Good Morning Dr Loletha Carrow,  Supervising MD 4/4 AM  I have received a phone call from this patient wishing to establish care with our office. Patient states she has been experiencing severe belching and constant loose stools along with some abdominal pain. She has previous history with [facility] including procedures in 2022 and an office visit in 2021, but says she is not physically able to travel that far anymore and is looking for somewhere closer. Records are available to view via Care Everywhere, please review them at your earliest convenience and advise on scheduling.   Thank You!

## 2023-02-21 NOTE — Progress Notes (Signed)
Reviewed and agree with Dr Koval's plan.   

## 2023-02-22 ENCOUNTER — Ambulatory Visit (HOSPITAL_BASED_OUTPATIENT_CLINIC_OR_DEPARTMENT_OTHER): Payer: Medicare PPO | Admitting: Family

## 2023-02-22 ENCOUNTER — Encounter: Payer: Self-pay | Admitting: Gastroenterology

## 2023-02-22 NOTE — Telephone Encounter (Signed)
Community message sent to [employer]. Will await response.   Lummie Montijo C Renwick Asman, RN  

## 2023-02-22 NOTE — Telephone Encounter (Signed)
Pt OV scheduled 6/11 at 2:20 pm

## 2023-02-22 NOTE — Telephone Encounter (Signed)
I do not know if I will have anything additional to offer after prior GI care, but I can see her at my next available new patient appointment if she would like.  - HD

## 2023-02-24 ENCOUNTER — Encounter (HOSPITAL_BASED_OUTPATIENT_CLINIC_OR_DEPARTMENT_OTHER): Payer: Self-pay | Admitting: Family

## 2023-02-26 NOTE — Telephone Encounter (Signed)
Mailed application to pts home 

## 2023-02-26 NOTE — Telephone Encounter (Signed)
Medication will be changed to [place], using [employer].    See telephone note from Dr Raymondo Band 02/21/23.

## 2023-03-12 ENCOUNTER — Other Ambulatory Visit: Payer: Self-pay | Admitting: Family Medicine

## 2023-03-12 DIAGNOSIS — Z1231 Encounter for screening mammogram for malignant neoplasm of breast: Secondary | ICD-10-CM

## 2023-03-13 ENCOUNTER — Telehealth: Payer: Self-pay

## 2023-03-13 NOTE — Telephone Encounter (Signed)
Received call from [employer] on nurse line.   They are requesting most recent office notes for patient to receive CGM.   Office notes faxed to [phone].

## 2023-03-18 ENCOUNTER — Encounter: Payer: Self-pay | Admitting: Family Medicine

## 2023-03-18 ENCOUNTER — Ambulatory Visit (INDEPENDENT_AMBULATORY_CARE_PROVIDER_SITE_OTHER): Payer: Medicare PPO | Admitting: Family Medicine

## 2023-03-18 VITALS — BP 153/79 | HR 73 | Ht 61.0 in | Wt 180.8 lb

## 2023-03-18 DIAGNOSIS — E1142 Type 2 diabetes mellitus with diabetic polyneuropathy: Secondary | ICD-10-CM

## 2023-03-18 DIAGNOSIS — D751 Secondary polycythemia: Secondary | ICD-10-CM | POA: Diagnosis not present

## 2023-03-18 DIAGNOSIS — K002 Abnormalities of size and form of teeth: Secondary | ICD-10-CM | POA: Diagnosis not present

## 2023-03-18 DIAGNOSIS — Z794 Long term (current) use of insulin: Secondary | ICD-10-CM | POA: Diagnosis not present

## 2023-03-18 DIAGNOSIS — J984 Other disorders of lung: Secondary | ICD-10-CM | POA: Diagnosis not present

## 2023-03-18 LAB — POCT GLYCOSYLATED HEMOGLOBIN (HGB A1C): HbA1c, POC (controlled diabetic range): 6.5 % (ref 0.0–7.0)

## 2023-03-18 NOTE — Patient Instructions (Addendum)
It was wonderful to see you today. Thank you for allowing me to be a part of your care. Below is a short summary of what we discussed at your visit today:  Cracked teeth Today we are checking your vitamin D and calcium levels.  Continue your vitamin D and calcium supplementation. Talk with your dentist about possible causes.  One could be tooth grinding, especially unconsciously or at night. You could possibly get a generic mouthguard at the pharmacy or obtain a custom one from the dentist.  Diabetes Your A1c today is 6.5, improved from last time!  Keep up the good work!  Elevated hemoglobin Today obtained extra blood work to check in on your hemoglobin.  If it remains high, we will look into it.  Primary care doctor graduating soon! I am graduating June 28th. You will be assigned a new PCP.  Thank you for letting me care for you! It has been wonderful getting to know you.    Please bring all of your medications to every appointment!  If you have any questions or concerns, please do not hesitate to contact us via phone or MyChart message.   Fayette Pho, MD

## 2023-03-18 NOTE — Progress Notes (Unsigned)
SUBJECTIVE:   CHIEF COMPLAINT / HPI:   Lung function follow up Cassandra Reid underwent pulmonary function testing with Dr. Raymondo Band 4/03.  PFT revealed mild restrictive lung disease with significant prepost change s/p nebulized albuterol.  Dr. Raymondo Band recommended symbicort (budesonide/formoterol 80/4.5 mcg  2 puffs BID and 2 puffs as needed for additional symptoms.   However, later it was determined that cost of Symbicort was determined to be cost prohibitive to patient.  Medication assistance initiated with the help of Durward Mallard, could pursue alternative of Breyna (identical ingredients). Recommended to start Albuterol MDI 2 puffs BID + PRN as short term treatment plan until able to obtain Symbicort or Breyna.   Today, patient reports adherence to the albuterol as prescribed. She reports breathing little easier, however is coughing more. Patient did receive medication assistance packet.  She then completed it and mailed it back on the same day.  She has not yet heard from the company.    T2DM At last appointment with Dr. Raymondo Band 4/03, she reported some hypoglycemic episodes, especially overnight.  At that time, her Evaristo Bury was reduced to 42 units daily and her [place] was reduced to 14-12-10 units before meals; if BG readings are >180, increase the dose by 2 units.   Today, she reports that after an initial rise in her CGM reported blood sugars after this, they have since stabilized and are now in range.  She has only had 1 low normal blood sugar down to the 70s which responded quite well to glucose tabs. She is quite pleased with her progress.  A1c today has improved to 6.5.  Lab Results  Component Value Date   HGBA1C 6.5 03/18/2023   HGBA1C 7.1 (H) 11/23/2022   HGBA1C 6.8 09/21/2022   Lab Results  Component Value Date   MICROALBUR 150 11/10/2020   LDLCALC 92 06/26/2022   CREATININE 1.06 (H) 02/12/2023   Teeth issues Patient reports in the last month, she has cracked 3 different teeth and needed  to have them removed by dentist.  All 3 occurrences happened while eating normal soft foods such as a sandwich.  Did not occur while eating hard foods such as nuts or chewing on ice.  She asked her dentist about possible etiologies, they thought perhaps normal aging of the teeth.  No signs of infection and no pain prior to cracking.  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   Chronic left maxillary sinusitis 11/23/2022   Chronic sinusitis 11/23/2022   Orthostatic hypotension 07/10/2022   Myofascial pain dysfunction syndrome 04/13/2022   Paroxysmal atrial tachycardia 11/24/2021   Cerebral aneurysm without rupture    Major depressive disorder    Generalized anxiety disorder    Balance problems 09/22/2021   Incontinence 09/22/2021   Obesity (BMI 30.0-34.9) 05/25/2021   Lacunar infarction 05/25/2021   Osteoporosis 01/18/2021   Encephalomalacia on imaging study 08/25/2020   Fatty liver 05/31/2020   Chest pain 03/26/2020   Chronic pain syndrome 04/23/2019   DDD (degenerative disc disease), cervical 04/16/2019   CKD stage 3 due to type 2 diabetes mellitus 04/15/2019   Hypertension associated with diabetes 07/18/2018   GERD (gastroesophageal reflux disease) 07/18/2018   Complex sleep apnea syndrome 07/18/2018   Type 2 diabetes mellitus with diabetic polyneuropathy, with long-term current  use of insulin 07/18/2018   HLD, secondary prevention, LDL goal <70 07/18/2018   Fibromyalgia    Legally blind 09/01/2003    OBJECTIVE:   BP (!) 153/79   Pulse 73   Ht 5\' 1"  (1.549 m)   Wt 180 lb 12.8 oz (82 kg)   SpO2 100%   BMI 34.16 kg/m    PHQ-9:     03/18/2023   11:16 AM 01/07/2023   10:58 AM 12/07/2022   11:36 AM  Depression screen PHQ 2/9  Decreased Interest 3 2 2    Down, Depressed, Hopeless 2 3 2   PHQ - 2 Score 5 5 4   Altered sleeping 3 3 3   Tired, decreased energy 0 2 3  Change in appetite 0 0 0  Feeling bad or failure about yourself  3 3 2   Trouble concentrating 0 1 2  Moving slowly or fidgety/restless 0 0 0  Suicidal thoughts 0 0 0  PHQ-9 Score 11 14 14   Difficult doing work/chores Extremely dIfficult Extremely dIfficult Very difficult    Physical Exam General: Awake, alert, oriented Cardiovascular: Regular rate and rhythm, S1 and S2 present, no murmurs auscultated Respiratory: Lung fields clear to auscultation bilaterally Neuro: Cranial nerves II through X grossly intact, able to move all extremities spontaneously   ASSESSMENT/PLAN:   Recurrent cracking of teeth Unclear etiology, although patient does have known osteopenia, sleep apnea, and anxiety.  Will check labs to ensure normal vitamin D and calcium.  Discussed with patient possibility of bruxism, recommended generic mouthguard from pharmacy until she can talk with dentist about this.  Erythrocytosis Last 3 CBCs with elevated hemoglobin ranging 16.6-17.0.  Unclear etiology.  Patient does have sleep apnea.  Remote history of smoking, but has not smoked in over 30 years or so.  Today we will obtain repeat CBC with differential and EPO.  Type 2 diabetes mellitus with diabetic polyneuropathy, with long-term current use of insulin A1c improved today to 6.5.  Patient doing well with recent reductions by Dr. Raymondo Band.  Only 1 episode of low normal blood sugars down to the 70s, responded well to glucose tab.  Overall she is quite pleased with her progress and I am too.  No changes at this time.  Restrictive lung disease Will continue with albuterol 2 puffs twice daily plus as needed and short-term treatment plan until we are able to obtain Symbicort or Breyna.  Unfortunately no samples available today in the office. Will follow-up with pharmacy tech Tye about assistance program for [place].      Cassandra Pho, MD [facility]

## 2023-03-19 ENCOUNTER — Encounter: Payer: Self-pay | Admitting: Family Medicine

## 2023-03-19 DIAGNOSIS — D751 Secondary polycythemia: Secondary | ICD-10-CM | POA: Insufficient documentation

## 2023-03-19 DIAGNOSIS — K002 Abnormalities of size and form of teeth: Secondary | ICD-10-CM | POA: Insufficient documentation

## 2023-03-19 DIAGNOSIS — J984 Other disorders of lung: Secondary | ICD-10-CM | POA: Insufficient documentation

## 2023-03-19 NOTE — Telephone Encounter (Signed)
Clinical notes faxed again to [phone].  [employer] reports they never received the first time.

## 2023-03-19 NOTE — Assessment & Plan Note (Addendum)
Will continue with albuterol 2 puffs twice daily plus as needed and short-term treatment plan until we are able to obtain Symbicort or Breyna.  Unfortunately no samples available today in the office. Will follow-up with pharmacy tech Ceylon about assistance program for [place].

## 2023-03-19 NOTE — Assessment & Plan Note (Signed)
Unclear etiology, although patient does have known osteopenia, sleep apnea, and anxiety.  Will check labs to ensure normal vitamin D and calcium.  Discussed with patient possibility of bruxism, recommended generic mouthguard from pharmacy until she can talk with dentist about this.

## 2023-03-19 NOTE — Assessment & Plan Note (Signed)
A1c improved today to 6.5.  Patient doing well with recent reductions by Dr. Raymondo Band.  Only 1 episode of low normal blood sugars down to the 70s, responded well to glucose tab.  Overall she is quite pleased with her progress and I am too.  No changes at this time.

## 2023-03-19 NOTE — Telephone Encounter (Signed)
Submitted application for BREYNA INH to [employer] for patient assistance.   Phone: [phone]

## 2023-03-19 NOTE — Assessment & Plan Note (Signed)
Last 3 CBCs with elevated hemoglobin ranging 16.6-17.0.  Unclear etiology.  Patient does have sleep apnea.  Remote history of smoking, but has not smoked in over 30 years or so.  Today we will obtain repeat CBC with differential and EPO.

## 2023-03-20 LAB — COMPREHENSIVE METABOLIC PANEL
ALT: 70 IU/L — ABNORMAL HIGH (ref 0–32)
AST: 40 IU/L (ref 0–40)
Albumin/Globulin Ratio: 2 (ref 1.2–2.2)
Albumin: 4.6 g/dL (ref 3.9–4.9)
Alkaline Phosphatase: 105 IU/L (ref 44–121)
BUN/Creatinine Ratio: 12 (ref 12–28)
BUN: 13 mg/dL (ref 8–27)
Bilirubin Total: 0.4 mg/dL (ref 0.0–1.2)
CO2: 18 mmol/L — ABNORMAL LOW (ref 20–29)
Calcium: 10.2 mg/dL (ref 8.7–10.3)
Chloride: 104 mmol/L (ref 96–106)
Creatinine, Ser: 1.05 mg/dL — ABNORMAL HIGH (ref 0.57–1.00)
Globulin, Total: 2.3 g/dL (ref 1.5–4.5)
Glucose: 98 mg/dL (ref 70–99)
Potassium: 4.2 mmol/L (ref 3.5–5.2)
Sodium: 141 mmol/L (ref 134–144)
Total Protein: 6.9 g/dL (ref 6.0–8.5)
eGFR: 59 mL/min/{1.73_m2} — ABNORMAL LOW (ref >59)

## 2023-03-20 LAB — CBC WITH DIFFERENTIAL
Basophils Absolute: 0.1 10*3/uL (ref 0.0–0.2)
Basos: 1 %
EOS (ABSOLUTE): 0.2 10*3/uL (ref 0.0–0.4)
Eos: 3 %
Hematocrit: 52 % — ABNORMAL HIGH (ref 34.0–46.6)
Hemoglobin: 17.2 g/dL — ABNORMAL HIGH (ref 11.1–15.9)
Immature Grans (Abs): 0 10*3/uL (ref 0.0–0.1)
Immature Granulocytes: 0 %
Lymphocytes Absolute: 1.2 10*3/uL (ref 0.7–3.1)
Lymphs: 18 %
MCH: 29.5 pg (ref 26.6–33.0)
MCHC: 33.1 g/dL (ref 31.5–35.7)
MCV: 89 fL (ref 79–97)
Monocytes Absolute: 0.5 10*3/uL (ref 0.1–0.9)
Monocytes: 7 %
Neutrophils Absolute: 4.6 10*3/uL (ref 1.4–7.0)
Neutrophils: 71 %
RBC: 5.84 x10E6/uL — ABNORMAL HIGH (ref 3.77–5.28)
RDW: 14.2 % (ref 11.7–15.4)
WBC: 6.5 10*3/uL (ref 3.4–10.8)

## 2023-03-20 LAB — ERYTHROPOIETIN: Erythropoietin: 13 m[IU]/mL (ref 2.6–18.5)

## 2023-03-20 LAB — VITAMIN D 25 HYDROXY (VIT D DEFICIENCY, FRACTURES): Vit D, 25-Hydroxy: 38 ng/mL (ref 30.0–100.0)

## 2023-03-20 LAB — LDL CHOLESTEROL, DIRECT: LDL Direct: 129 mg/dL — ABNORMAL HIGH (ref 0–99)

## 2023-03-21 ENCOUNTER — Encounter: Payer: Self-pay | Admitting: Family Medicine

## 2023-03-21 ENCOUNTER — Telehealth: Payer: Self-pay | Admitting: Family Medicine

## 2023-03-21 DIAGNOSIS — E1122 Type 2 diabetes mellitus with diabetic chronic kidney disease: Secondary | ICD-10-CM

## 2023-03-21 DIAGNOSIS — Z794 Long term (current) use of insulin: Secondary | ICD-10-CM

## 2023-03-21 DIAGNOSIS — E1169 Type 2 diabetes mellitus with other specified complication: Secondary | ICD-10-CM

## 2023-03-21 NOTE — Telephone Encounter (Signed)
Called patient to discuss results.   ALT 70 - repeat lab in 2-4 weeks Creatinine at baseline, 1.05, EGFR 59 Hemoglobin 17.2, up from 16.6 on 3/26 - will reach out to heme Vitamin D 38 LDL 129 - Previously doing well on atorvastatin+zetia, but was told by some doctor to STOP atorvastatin (unclear reason).   The 10-year ASCVD risk score (Arnett DK, et al., 2019) is: 19.6%   Values used to calculate the score:     Age: 67 years     Sex: Female     Is Non-Hispanic African American: No     Diabetic: Yes     Tobacco smoker: No     Systolic Blood Pressure: 153 mmHg     Is BP treated: Yes     HDL Cholesterol: 48 mg/dL     Total Cholesterol: 184 mg/dL

## 2023-03-22 ENCOUNTER — Other Ambulatory Visit: Payer: Self-pay | Admitting: Family Medicine

## 2023-03-22 DIAGNOSIS — E1142 Type 2 diabetes mellitus with diabetic polyneuropathy: Secondary | ICD-10-CM | POA: Diagnosis not present

## 2023-03-22 DIAGNOSIS — D751 Secondary polycythemia: Secondary | ICD-10-CM

## 2023-03-22 NOTE — Progress Notes (Signed)
Chatted with hematologist, who recommends testing for JAK2 mutations as part of erythrocytosis work up. If negative, recommends continuing to monitor.   Order placed. Selected this specific order as algorithm explicitly said to test for exon 14 and exon 12 mutations (variant allele frequency >1% is considered positive).   Will work with lab tech to see if we can add this on to previous or if patient needs to come in for re-draw.   Fayette Pho, MD

## 2023-03-26 ENCOUNTER — Telehealth: Payer: Self-pay | Admitting: Family Medicine

## 2023-03-26 DIAGNOSIS — D751 Secondary polycythemia: Secondary | ICD-10-CM

## 2023-03-26 NOTE — Telephone Encounter (Signed)
Called patient regarding need for additional blood draw in order to obtain a JAK2 testing as part of her erythrocytosis workup.  No answer, left voicemail.  The lab order has been placed as a future order.  She may come in at any time for future blood draw.  Fayette Pho, MD

## 2023-03-27 ENCOUNTER — Other Ambulatory Visit: Payer: Medicare PPO

## 2023-03-27 DIAGNOSIS — D751 Secondary polycythemia: Secondary | ICD-10-CM | POA: Diagnosis not present

## 2023-03-27 NOTE — Telephone Encounter (Signed)
Medication given to patient

## 2023-03-29 NOTE — Telephone Encounter (Signed)
Provided patient with [place].   Veronda Prude, RN

## 2023-04-01 ENCOUNTER — Other Ambulatory Visit: Payer: Self-pay

## 2023-04-03 NOTE — Telephone Encounter (Signed)
Received notification from [employer] regarding patient assistance DENIAL for BREYNA INH.   Reason: patient has insurance  Phone: [phone]  Dr. Raymondo Band, any other inhaler suggestions? I will look into assistance as well.

## 2023-04-04 DIAGNOSIS — Z1231 Encounter for screening mammogram for malignant neoplasm of breast: Secondary | ICD-10-CM

## 2023-04-08 ENCOUNTER — Ambulatory Visit: Payer: Medicare PPO

## 2023-04-08 LAB — NGS JAK2 EXONS 12-15

## 2023-04-10 ENCOUNTER — Telehealth: Payer: Self-pay | Admitting: Family Medicine

## 2023-04-10 DIAGNOSIS — D751 Secondary polycythemia: Secondary | ICD-10-CM

## 2023-04-10 NOTE — Telephone Encounter (Signed)
Called patient regarding lab result for JAK2 mutations as part of erythrocytosis work up.   This lab was negative. Given previous conversation with hematologist, will simply monitor. No referral to heme onc at this time.   No answer, left VM.   Plan to recheck Hgb in 2-3 months. Could send to heme at that time if continues to rise.   Fayette Pho, MD

## 2023-04-16 ENCOUNTER — Ambulatory Visit
Admission: RE | Admit: 2023-04-16 | Discharge: 2023-04-16 | Disposition: A | Discharge status: Home / Self Care | Payer: Medicare PPO | Source: Ambulatory Visit | Attending: Internal Medicine | Admitting: Internal Medicine

## 2023-04-16 DIAGNOSIS — Z1231 Encounter for screening mammogram for malignant neoplasm of breast: Secondary | ICD-10-CM | POA: Diagnosis not present

## 2023-04-17 ENCOUNTER — Other Ambulatory Visit: Payer: Self-pay | Admitting: Internal Medicine

## 2023-04-20 ENCOUNTER — Encounter: Payer: Self-pay | Admitting: Family Medicine

## 2023-04-20 DIAGNOSIS — J984 Other disorders of lung: Secondary | ICD-10-CM

## 2023-04-22 MED ORDER — ALBUTEROL SULFATE HFA 108 (90 BASE) MCG/ACT IN AERS: 2.0000 | INHALATION_SPRAY | Freq: Two times a day (BID) | RESPIRATORY_TRACT | 11 refills | Status: DC

## 2023-04-26 ENCOUNTER — Other Ambulatory Visit (HOSPITAL_COMMUNITY): Payer: Self-pay | Admitting: Family Medicine

## 2023-04-26 DIAGNOSIS — Z1231 Encounter for screening mammogram for malignant neoplasm of breast: Secondary | ICD-10-CM

## 2023-04-30 ENCOUNTER — Telehealth: Payer: Self-pay | Admitting: Gastroenterology

## 2023-04-30 ENCOUNTER — Ambulatory Visit: Payer: Medicare PPO

## 2023-04-30 ENCOUNTER — Ambulatory Visit: Payer: Medicare PPO | Admitting: Gastroenterology

## 2023-04-30 NOTE — Progress Notes (Deleted)
[facility] Consult Note:  History: Cassandra Reid 04/30/2023  Referring provider: Fayette Pho, MD  Reason for consult/chief complaint: No chief complaint on file.   Subjective  HPI: This patient contacted our office requesting to transfer care from previous GI practice in the [facility], citing challenges traveling that far for ongoing care. She has been bothered for years by excessive belching, abdominal pain and loose stool. ED visit late March for worsening dyspnea on exertion, no acute cardiopulmonary cause or urgent lab abnormalities that day. ***   ROS:  Review of Systems   Past Medical History: Past Medical History:  Diagnosis Date   Acute pain of left shoulder 04/03/2022   Allergic rhinitis 09/26/2018   Anosmia    Anosmia    Arthritis    Asthma    Balance problems 09/22/2021   C. difficile colitis 07/18/2018   Cerebral aneurysm without rupture    Chest pain 03/26/2020   Chronic pain syndrome 04/23/2019   CKD stage 3 due to type 2 diabetes mellitus 04/15/2019   DDD (degenerative disc disease), cervical 04/16/2019   Encephalomalacia on imaging study 08/25/2020   Brain MRI 08/25/21: Few scattered areas of encephalomalacia and gliosis within the overlying left frontal lobe most likely postoperative in nature.   Fatty liver 05/31/2020   Fibromyalgia    Former smoker    Generalized anxiety disorder    GERD (gastroesophageal reflux disease)    Heart murmur    History of chicken pox    History of colon polyps    Hyperlipidemia associated with type 2 diabetes mellitus 07/18/2018   Hypertension associated with diabetes 07/18/2018   IBS (irritable bowel syndrome)    Incontinence 09/22/2021   Bowel and bladder.    Ischemic brain injury 05/29/2021   Lacunar infarction 05/25/2021   Brain MRI 08/25/21: Few small remote lacunar infarcts noted at the right caudate and left internal capsule   Legally blind    Major depressive  disorder    Migraines    Myalgia 04/23/2019   OSA (obstructive sleep apnea) 07/18/2018   not using CPAP machine (broken)   Osteoporosis 01/18/2021   Paroxysmal atrial tachycardia    On telemetry   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   Type 2 diabetes mellitus with diabetic polyneuropathy, with long-term current use of insulin 07/18/2018   has dexcom G7  - sensor left upper arm   From digestive health clinic office visit October 2021: "Patient states that she has been having fluctuating bowel habits for several years now. States that she has several days of constipation where she has difficulty passing stools, sensation of incomplete evacuation followed by a few days of loose nonbloody stools. States that during the episodes of constipation she has significant abdominal bloating and cramping. This resolves after passage of a bowel movement. She has been taking an over-the-counter fiber gummy which has been helping some of her symptoms. She also takes lactulose as needed during episodes of constipation that also helps her symptoms but does report that she has increased bloating when taking lactulose.  She denies any blood in her stools. Denies any nocturnal symptoms. Denies any unintentional weight loss, loss of appetite. In addition to above symptoms patient has been having near daily symptoms of reflux. Associated symptoms include severe heartburn, epigastric pain, and globus sensation. Denies any overt dysphagia but states that often has a sensation that something is stuck  in her throat. Takes over-the-counter Tums, which does not help symptoms. Cannot recall if she was on a PPI.  She has had several colonoscopies in her lifetime, most recently 4 years ago in [place] where she previously used to reside. She was told that she has "large polyps" and to have a colonoscopy every couple of years. She does not  have her records with her. "  From endoscopic appointment on 11/24/2020:(Dr. Takala) "EGD:  Normal esophagus  Erosive gastritis, bx obtained Duodenitis, bx obtained  Colonoscopy:  Poor prep, aborted  Plan: Await path results Pt reports taking naproxen weekly- likely has NSAID gastritis. Encouraged avoidance and continue PPI  Poor prep- pt did not respond to 2 day prep. Will start Linzess for chronic constipation. In addition, week of prep- encouraged miralax twice a day in addition to Linzess, and then 2 day prep for colonoscopy."  From endoscopic encounter 01/02/2021: "Impression: Few subcentimeter polyps, removed with cold forceps. Sigmoid diverticulosis Prep fair after extensive lavage.  Plan: Recall colonoscopy based on pathology results, likely 5 years due to prep. Patient symptoms of changes in bowel habits are likely due to chronic constipation. Patient instructed to continue MiraLAX as well as fiber supplement. Patient was not able to pick up Linzess due to insurance related issues. For now patient states that she has been responding well to MiraLAX and fiber supplement. If no improvement will consider alternatives such as Trulance. Follow-up in the office as previously suggested. "   Past Surgical History: Past Surgical History:  Procedure Laterality Date   ABDOMINAL HYSTERECTOMY  2012   APPENDECTOMY  1972   CEREBRAL ANEURYSM REPAIR Left 2004   Aneurysm clip in place near the level of the left ICA on Brain MRI 08/2020. Also coiling performed   CHOLECYSTECTOMY  2015   COLONOSCOPY  11/24/2020   in [place]   CRANIOTOMY Left    left pterional craniotomy   ETHMOIDECTOMY Left 11/23/2022   Procedure: LEFT ANTERIOR ETHMOIDECTOMY;  Surgeon: Laren Boom, DO;  Location: [facility] OR;  Service: ENT;  Laterality: Left;   MAXILLARY ANTROSTOMY Left 11/23/2022   Procedure: MAXILLARY ANTROSTOMY;  Surgeon: Laren Boom, DO;  Location: [facility] OR;  Service: ENT;  Laterality: Left;   MENISCUS  REPAIR Bilateral    NASAL TURBINATE REDUCTION Bilateral 11/23/2022   Procedure: Bilateral turbinate reduction;  Surgeon: Laren Boom, DO;  Location: [facility] OR;  Service: ENT;  Laterality: Bilateral;   SINUS ENDO W/FUSION Left 11/23/2022   Procedure: Functional endoscopic sinus surgery with navigation.;  Surgeon: Laren Boom, DO;  Location: [facility] OR;  Service: ENT;  Laterality: Left;   UPPER GI ENDOSCOPY  11/24/2020   in [place]     Family History: Family History  Problem Relation Age of Onset   Alcohol abuse Mother    Diabetes Mother    Hypertension Mother    Kidney disease Mother    COPD Father    Diabetes Father    Early death Father    Heart disease Father    Hyperlipidemia Father    Hypertension Father    Diabetes Sister    Hypertension Sister    Breast cancer Sister    Diabetes Sister    Hypertension Sister    Kidney disease Sister    Cancer Maternal Grandmother    Alcohol abuse Maternal Grandfather    Cancer Maternal Grandfather    Cancer Paternal Grandmother    Cancer Paternal Grandfather     Social History: Social History  Socioeconomic History   Marital status: Divorced    Spouse name: John   Number of children: 3   Years of education: 14   Highest education level: Associate degree: occupational, Scientist, product/process development, or vocational program  Occupational History   Occupation: disabled  Tobacco Use   Smoking status: Former    Packs/day: 1.00    Years: 3.00    Additional pack years: 0.00    Total pack years: 3.00    Types: Cigarettes    Quit date: 06/1967    Years since quitting: 55.8    Passive exposure: Past   Smokeless tobacco: Never  Vaping Use   Vaping Use: Never used  Substance and Sexual Activity   Alcohol use: Never   Drug use: Not Currently   Sexual activity: Not Currently    Birth control/protection: Surgical    Comment: Hysterectomy  Other Topics Concern   Not on file  Social History Narrative   Patient lives in [place].    Patient walks  her dog daily for exercise.    Patient is separated from her husband.   Patient does not drive. Uses home delivery services and ubers.    Right handed   One story apartment   Caffeine on daily   Social Determinants of Health   Financial Resource Strain: Low Risk  (02/08/2023)   Overall Financial Resource Strain (CARDIA)    Difficulty of Paying Living Expenses: Not very hard  Food Insecurity: No Food Insecurity (02/08/2023)   Hunger Vital Sign    Worried About Running Out of Food in the Last Year: Never true    Ran Out of Food in the Last Year: Never true  Transportation Needs: Unmet Transportation Needs (02/08/2023)   PRAPARE - Transportation    Lack of Transportation (Medical): Yes    Lack of Transportation (Non-Medical): Yes  Physical Activity: Insufficiently Active (02/08/2023)   Exercise Vital Sign    Days of Exercise per Week: 2 days    Minutes of Exercise per Session: 10 min  Stress: Stress Concern Present (02/08/2023)   [employer] of Occupational Health - Occupational Stress Questionnaire    Feeling of Stress : Very much  Social Connections: Socially Isolated (02/08/2023)   Social Connection and Isolation Panel [NHANES]    Frequency of Communication with Friends and Family: Never    Frequency of Social Gatherings with Friends and Family: Never    Attends Religious Services: Never    Database administrator or Organizations: No    Attends Engineer, structural: Not on file    Marital Status: Divorced    Allergies: No Known Allergies  Outpatient Meds: Current Outpatient Medications  Medication Sig Dispense Refill   albuterol (VENTOLIN HFA) 108 (90 Base) MCG/ACT inhaler Inhale 2 puffs into the lungs 2 (two) times daily. Plus 2 puffs prn 18 g 11   Ascorbic Acid (VITAMIN C ADULT GUMMIES PO) Take 1 each by mouth daily in the afternoon.     BD PEN NEEDLE NANO 2ND GEN 32G X 4 MM MISC USE WITH INSULIN 100 each 4   blood glucose meter kit and supplies Dispense based  on patient and insurance preference. Use up to four times daily as directed. (FOR ICD-10 E10.9, E11.9). 1 each 0   budesonide-formoterol (BREYNA) 80-4.5 MCG/ACT inhaler Inhale 2 puffs into the lungs in the morning and at bedtime. And PRN shortness of breath. 1 each 12   Continuous Blood Gluc Sensor (DEXCOM G6 SENSOR) MISC Use as directed to check blood  sugar. 4 each 11   Continuous Blood Gluc Transmit (DEXCOM G6 TRANSMITTER) MISC Use as directed to check blood sugar. 1 each 12   dapagliflozin propanediol (FARXIGA) 10 MG TABS tablet Take 1 tablet (10 mg total) by mouth daily. 90 tablet 3   dicyclomine (BENTYL) 20 MG tablet Take 1 tablet (20 mg total) by mouth 2 (two) times daily as needed for spasms. 20 tablet 0   DULoxetine (CYMBALTA) 60 MG capsule Take 1 capsule (60 mg total) by mouth daily. 90 capsule 3   ezetimibe (ZETIA) 10 MG tablet Take 1 tablet (10 mg total) by mouth daily. 90 tablet 3   famotidine (PEPCID) 20 MG tablet Take 1 tablet (20 mg total) by mouth daily. 90 tablet 3   FIBER SELECT GUMMIES PO Take 1 tablet by mouth daily in the afternoon.     fluticasone (FLONASE) 50 MCG/ACT nasal spray Place 1 spray into both nostrils daily as needed for allergies or rhinitis. 9.9 mL 1   furosemide (LASIX) 40 MG tablet Take 1 tablet (40 mg total) by mouth as needed for edema. (Patient taking differently: Take 40 mg by mouth daily as needed (ankle swelling.).) 90 tablet 3   gabapentin (NEURONTIN) 600 MG tablet TAKE 0.5-1 TABLET (300-600 MG TOTAL) BY MOUTH 3 (THREE) TIMES DAILY. (Patient taking differently: Take 600 mg by mouth 2 (two) times daily.) 270 tablet 3   insulin aspart (FIASP) 100 UNIT/ML FlexTouch Pen Inject 14-16 Units into the skin daily with breakfast AND 12-14 Units daily with lunch AND 10-12 Units daily with supper. This is a patient assistance medication. Patient may not be approved and/or have medication. Please ask and verify when performing med review.. 15 mL 3   insulin degludec  (TRESIBA) 100 UNIT/ML FlexTouch Pen Inject 42 Units into the skin daily. This is a patient assistance medication. Patient may not be approved and/or have medication. Please ask and verify when performing med review. 15 mL 3   lisinopril (ZESTRIL) 40 MG tablet Take 1 tablet (40 mg total) by mouth daily. (Patient taking differently: Take 40 mg by mouth in the morning.) 90 tablet 3   loratadine (CLARITIN) 10 MG tablet TAKE 1 TABLET EVERY DAY 90 tablet 3   LORazepam (ATIVAN) 0.5 MG tablet TAKE 1 TABLET EVERY 8 HOURS AS NEEDED FOR ANXIETY 90 tablet 0   Magnesium 500 MG TABS Take 500 mg by mouth daily in the afternoon.     metoprolol succinate (TOPROL-XL) 50 MG 24 hr tablet Take 1 tablet (50 mg total) by mouth daily. Take with or immediately following a meal. 90 tablet 3   Multiple Vitamin (MULTIVITAMIN WITH MINERALS) TABS tablet Take 1 tablet by mouth in the morning.     OneTouch Delica Lancets 33G MISC Please use to check blood sugar up to 4 times daily. E11.42 100 each 12   Oyster Shell (OYSTER CALCIUM) 500 MG TABS tablet Take 500 mg of elemental calcium by mouth daily in the afternoon.     pantoprazole (PROTONIX) 40 MG tablet TAKE 1 TABLET EVERY DAY 90 tablet 3   potassium chloride SA (KLOR-CON M) 20 MEQ tablet TAKE 1 TABLET AS NEEDED (TAKE AS DIRECTED WITH LASIX AS NEEDED FOR EDEMA) 90 tablet 1   Semaglutide, 2 MG/DOSE, (OZEMPIC, 2 MG/DOSE,) 8 MG/3ML SOPN Inject 2 mg into the skin once a week. 9 mL 3   sucralfate (CARAFATE) 1 g tablet TAKE 1 TABLET FOUR TIMES DAILY, WITH MEALS AND AT BEDTIME (OKAY TO TRIAL OFF AS INSTRUCTED) (  Patient taking differently: Take 1 g by mouth in the morning and at bedtime.) 240 tablet 1   traZODone (DESYREL) 50 MG tablet TAKE 1/2 TABLET BY MOUTH AT BEDTIME AS NEEDED FOR SLEEP. 45 tablet 1   TRUE METRIX BLOOD GLUCOSE TEST test strip TEST BLOOD SUGAR AS INSTRUCTED 350 strip 3   No current facility-administered medications for this visit.       ___________________________________________________________________ Objective   Exam:  There were no vitals taken for this visit. Wt Readings from Last 3 Encounters:  03/18/23 180 lb 12.8 oz (82 kg)  02/21/23 178 lb (80.7 kg)  02/20/23 180 lb (81.6 kg)    General: ***  Eyes: sclera anicteric, no redness ENT: oral mucosa moist without lesions, no cervical or supraclavicular lymphadenopathy CV: ***, no JVD, no peripheral edema Resp: clear to auscultation bilaterally, normal RR and effort noted GI: soft, *** tenderness, with active bowel sounds. No guarding or palpable organomegaly noted. Skin; warm and dry, no rash or jaundice noted Neuro: awake, alert and oriented x 3. Normal gross motor function and fluent speech  Labs:  ***  Radiologic Studies:  ***  Assessment: No diagnosis found.  ***  Plan:  ***  Thank you for the courtesy of this consult.  Please call me with any questions or concerns.  Charlie Pitter III  CC: Referring provider noted above

## 2023-04-30 NOTE — Telephone Encounter (Signed)
Patient has re-scheduled her appointment.

## 2023-04-30 NOTE — Telephone Encounter (Signed)
Inbound call from patient inquiring if her OV for today at 2:20 can be virtual , due to her having a cough. Please advise.   Thank you

## 2023-05-05 ENCOUNTER — Other Ambulatory Visit: Payer: Self-pay | Admitting: Nurse Practitioner

## 2023-05-09 ENCOUNTER — Encounter: Payer: Self-pay | Admitting: Family Medicine

## 2023-05-17 DIAGNOSIS — M5481 Occipital neuralgia: Secondary | ICD-10-CM | POA: Diagnosis not present

## 2023-05-19 ENCOUNTER — Other Ambulatory Visit: Payer: Self-pay | Admitting: Nurse Practitioner

## 2023-05-19 ENCOUNTER — Other Ambulatory Visit: Payer: Self-pay | Admitting: Family Medicine

## 2023-05-19 DIAGNOSIS — E1169 Type 2 diabetes mellitus with other specified complication: Secondary | ICD-10-CM

## 2023-05-19 DIAGNOSIS — E876 Hypokalemia: Secondary | ICD-10-CM

## 2023-05-20 ENCOUNTER — Ambulatory Visit: Payer: Medicare PPO | Admitting: Pharmacist

## 2023-05-20 ENCOUNTER — Other Ambulatory Visit (HOSPITAL_COMMUNITY): Payer: Self-pay

## 2023-05-20 ENCOUNTER — Encounter: Payer: Self-pay | Admitting: Pharmacist

## 2023-05-20 ENCOUNTER — Other Ambulatory Visit: Payer: Self-pay | Admitting: Family Medicine

## 2023-05-20 VITALS — BP 133/60 | HR 70 | Wt 179.0 lb

## 2023-05-20 DIAGNOSIS — D751 Secondary polycythemia: Secondary | ICD-10-CM

## 2023-05-20 DIAGNOSIS — Z794 Long term (current) use of insulin: Secondary | ICD-10-CM | POA: Diagnosis not present

## 2023-05-20 DIAGNOSIS — E1142 Type 2 diabetes mellitus with diabetic polyneuropathy: Secondary | ICD-10-CM

## 2023-05-20 DIAGNOSIS — J984 Other disorders of lung: Secondary | ICD-10-CM

## 2023-05-20 NOTE — Assessment & Plan Note (Signed)
Diabetes longstanding with recent worsening control despite medication adherence.  Elevations in glucose possibly related to stress with recent chronic pain.  Patient is able to verbalize appropriate hypoglycemia management plan. Medication adherence appears good. Control is suboptimal due to inadequate inlulin dosing during times of stress (pain).  Patient planning for neck injection for pain again in ~ 1 week.  -Increased dose of basal insulin Tresiba (insulin degludec) from 44 units to 50 units daily at 4 PM. -Increased dose of rapid insulin Fiasp (insulin aspart) by two units with each meal. Continued 2 unit sliding scale additional dose for any reading > 180.   -Continued GLP-1 Ozempic (semaglutide) at 2mg  weekly.   -Continued SGLT2-I Farxiga (dapagliflozin) 10mg  daily.   -Patient educated on purpose, proper use, and potential adverse effects including management of low readings after effect of stress/pain are controlled at a higher level. .  -Extensively discussed pathophysiology of diabetes, recommended lifestyle interventions, dietary effects on blood sugar control.  -Counseled on s/sx of and management of hypoglycemia.

## 2023-05-20 NOTE — Patient Instructions (Addendum)
It was nice to see you today!  Your goal blood sugar is 80-130 before eating and less than 180 after eating.  Medication Changes: Increase your dosing of Gabapentin to AM and at night.   Increase your Evaristo Bury to 50 units each at 4-6PM  Increase your Candie Mile (short acting) - to 16-18 prior to breakfast, 14-16 prior to lunch, and 12-14 prior to evening meal  PLUS additional 2 units if  > 180 blood sugar.    Please apply to [facility] Med[facility] - online application and determine elegibility.    Keep up the good work with diet and exercise. Aim for a diet full of vegetables, fruit and lean meats (chicken, [place], fish). Try to limit salt intake by eating fresh or frozen vegetables (instead of canned), rinse canned vegetables prior to cooking and do not add any additional salt to meals.

## 2023-05-20 NOTE — Progress Notes (Signed)
S:     Chief Complaint  Patient presents with   Medication Management    Medication Review - Diabetes   67 y.o. female who presents for diabetes evaluation, education, and management. Patient arrives in  good spirits and presents without any assistance.    Patient was referred and last seen by Primary Care Provider, Dr. Larita Fife, on 05/09/2023.  Last seen by Primary Care Provider, Dr. Larita Fife, on 03/18/2023.   PMH is significant for diabetes, and lung disease.  At last contact with Dr. Larita Fife, patient reported worsening glycemic control based on Dexcom Time In Range..   Current T2DM medications:  Tresiba (insulin degludec) 44units daily,  Fiasp (insulin aspart) 16-14-12 units before meals +2 units if > 180 mg/dl blood glucose.    Respiratory medications:  Contnues Albuterol 2 puffs BID  Patient reports adherence to taking all medications as prescribed.  However denies using gabapentin more than once daily due to feer of sedation.   Do you feel that your medications are working for you? no Have you been experiencing any side effects to the medications prescribed? no Do you have any problems obtaining medications due to transportation or finances? yes Insurance coverage: [employer]  Patient denies hypoglycemic events/symptoms but reports a few intermittent readings < 70 (low).    Patient-reported exercise habits: limited periodically by neck/back pain   O:   Review of Systems  Musculoskeletal:  Positive for back pain (and Neck).  Psychiatric/Behavioral:  The patient is nervous/anxious.   All other systems reviewed and are negative.   Physical Exam Constitutional:      Appearance: Normal appearance.  Pulmonary:     Effort: Pulmonary effort is normal.  Neurological:     Mental Status: She is alert.  Psychiatric:        Mood and Affect: Mood normal.        Behavior: Behavior normal.        Thought Content: Thought content normal.        Judgment: Judgment normal.       Dexcom G7 CGM Download: today  % Time CGM is active: 92.5% Average Glucose: 207 mg/dL Glucose Management Indicator: 8.3   Glucose Variability: 20.3 (goal <36%) Time in Goal:  - Time in range 70-180: 27% - Time above range: 73% (15% very  high > 250) - Time below range: 0% Observed patterns: overall elevation, including overnight.   Lab Results  Component Value Date   HGBA1C 6.5 03/18/2023   Vitals:   05/20/23 1024  BP: 133/60  Pulse: 70  SpO2: 98%    Lipid Panel     Component Value Date/Time   CHOL 184 06/26/2022 0824   TRIG 266 (H) 06/26/2022 0824   HDL 48 06/26/2022 0824   CHOLHDL 3.8 06/26/2022 0824   CHOLHDL 2.9 07/31/2019 1339   VLDL 38.2 07/18/2018 0940   LDLCALC 92 06/26/2022 0824   LDLCALC 48 07/31/2019 1339   LDLDIRECT 129 (H) 03/18/2023 1715    Clinical Atherosclerotic Cardiovascular Disease (ASCVD): No  The 10-year ASCVD risk score (Arnett DK, et al., 2019) is: 15.2%   Values used to calculate the score:     Age: 74 years     Sex: Female     Is Non-Hispanic African American: No     Diabetic: Yes     Tobacco smoker: No     Systolic Blood Pressure: 133 mmHg     Is BP treated: Yes     HDL Cholesterol: 48 mg/dL  Total Cholesterol: 184 mg/dL    A/P: Diabetes longstanding with recent worsening control despite medication adherence.  Elevations in glucose possibly related to stress with recent chronic pain.  Patient is able to verbalize appropriate hypoglycemia management plan. Medication adherence appears good. Control is suboptimal due to inadequate inlulin dosing during times of stress (pain).  Patient planning for neck injection for pain again in ~ 1 week.  -Increased dose of basal insulin Tresiba (insulin degludec) from 44 units to 50 units daily at 4 PM. -Increased dose of rapid insulin Fiasp (insulin aspart) by two units with each meal. Continued 2 unit sliding scale additional dose for any reading > 180.   -Continued GLP-1 Ozempic  (semaglutide) at 2mg  weekly.   -Continued SGLT2-I Farxiga (dapagliflozin) 10mg  daily.   -Patient educated on purpose, proper use, and potential adverse effects including management of low readings after effect of stress/pain are controlled at a higher level. .  -Extensively discussed pathophysiology of diabetes, recommended lifestyle interventions, dietary effects on blood sugar control.  -Counseled on s/sx of and management of hypoglycemia.  -Next A1c anticipated meet new provider visit.   Respiratory: Currently adherent with albuterol 2 puffs BID.   Unable to afford controlled inhaler in the past.  Unsure if any insurance issue which has been problematic in the past remains.  -provided option of applying to [facility] Med [facility] for potential help (however may not qualify).  -Will refer to [facility], CPhT to assess if any of her inhaler coverage has changed.   Chronic back/neck pain.  -Encouraged patient to increase dose of gabapentin from once daily at night to BID -Patient apprehensive but agreed to trial of BID dosing of gabapentin.   CBC obtained today in our lab at the request of PCP, Dr. Larita Fife.   Written patient instructions provided. Patient verbalized understanding of treatment plan.  Total time in face to face counseling 33 minutes.    Follow-up:  Pharmacist 4 weeks. PCP clinic visit new provider in the 1-2 months.

## 2023-05-20 NOTE — Assessment & Plan Note (Signed)
Respiratory: Currently adherent with albuterol 2 puffs BID.   Unable to afford controlled inhaler in the past.  Unsure if any insurance issue which has been problematic in the past remains.  -provided option of applying to [facility] Med Assist for potential help (however may not qualify).  -Will refer to [facility], CPhT to assess if any of her inhaler coverage has changed.

## 2023-05-21 ENCOUNTER — Ambulatory Visit: Payer: Medicare PPO | Attending: Internal Medicine

## 2023-05-21 ENCOUNTER — Encounter: Payer: Self-pay | Admitting: Cardiology

## 2023-05-21 LAB — CBC WITH DIFFERENTIAL/PLATELET
Basophils Absolute: 0.1 10*3/uL (ref 0.0–0.2)
Basos: 1 %
EOS (ABSOLUTE): 0.3 10*3/uL (ref 0.0–0.4)
Eos: 3 %
Hematocrit: 52.1 % — ABNORMAL HIGH (ref 34.0–46.6)
Hemoglobin: 17.1 g/dL — ABNORMAL HIGH (ref 11.1–15.9)
Immature Grans (Abs): 0 10*3/uL (ref 0.0–0.1)
Immature Granulocytes: 1 %
Lymphocytes Absolute: 1.4 10*3/uL (ref 0.7–3.1)
Lymphs: 18 %
MCH: 28.5 pg (ref 26.6–33.0)
MCHC: 32.8 g/dL (ref 31.5–35.7)
MCV: 87 fL (ref 79–97)
Monocytes Absolute: 0.5 10*3/uL (ref 0.1–0.9)
Monocytes: 7 %
Neutrophils Absolute: 5.2 10*3/uL (ref 1.4–7.0)
Neutrophils: 70 %
Platelets: 189 10*3/uL (ref 150–450)
RBC: 5.99 x10E6/uL — ABNORMAL HIGH (ref 3.77–5.28)
RDW: 13.4 % (ref 11.7–15.4)
WBC: 7.5 10*3/uL (ref 3.4–10.8)

## 2023-05-21 NOTE — Progress Notes (Signed)
Reviewed and agree with Dr Koval's plan.   

## 2023-05-26 NOTE — Patient Instructions (Incomplete)
Cassandra Reid , Thank you for taking time to come for your Medicare Wellness Visit. I appreciate your ongoing commitment to your health goals. Please review the following plan we discussed and let me know if I can assist you in the future.   These are the goals we discussed:  Goals      Coping Skills Enhanced by connecting with mental health provider     Patient Goals/Self-Care Activities: Over the next 90 days Continue to work with [employer] for counseling  Continue taking Lexapro Review your EMMI Education on Relieving Stress and Getting a Good night sleep I have sent new EMMI education on  Movement: Emotional Health; Managing Anxiety around daily task Continue to connect to your support group Start [employer] program      HEMOGLOBIN A1C < 7     7.26 December 2021        This is a list of the screening recommended for you and due dates:  Health Maintenance  Topic Date Due   Complete foot exam   02/22/2022   COVID-19 Vaccine (6 - 2023-24 season) 09/03/2022   Pneumonia Vaccine (1 of 1 - PCV) Never done   Medicare Annual Wellness Visit  03/20/2023   Flu Shot  06/20/2023   Hemoglobin A1C  09/17/2023   Yearly kidney health urinalysis for diabetes  09/22/2023   Yearly kidney function blood test for diabetes  03/17/2024   Mammogram  04/15/2025   Colon Cancer Screening  01/02/2026   DTaP/Tdap/Td vaccine (2 - Td or Tdap) 01/23/2026   DEXA scan (bone density measurement)  Completed   Hepatitis C Screening  Completed   HIV Screening  Completed   Zoster (Shingles) Vaccine  Completed   HPV Vaccine  Aged Out   Eye exam for diabetics  Discontinued    Advanced directives: We have a copy of your advanced directives available in your record should your provider ever need to access them.  Conditions/risks identified: Aim for 30 minutes of exercise or brisk walking, 6-8 glasses of water, and 5 servings of fruits and vegetables each day.  Next appointment: Follow up in  one year for your annual wellness visit    Preventive Care 65 Years and Older, Female Preventive care refers to lifestyle choices and visits with your health care provider that can promote health and wellness. What does preventive care include? A yearly physical exam. This is also called an annual well check. Dental exams once or twice a year. Routine eye exams. Ask your health care provider how often you should have your eyes checked. Personal lifestyle choices, including: Daily care of your teeth and gums. Regular physical activity. Eating a healthy diet. Avoiding tobacco and drug use. Limiting alcohol use. Practicing safe sex. Taking low-dose aspirin every day. Taking vitamin and mineral supplements as recommended by your health care provider. What happens during an annual well check? The services and screenings done by your health care provider during your annual well check will depend on your age, overall health, lifestyle risk factors, and family history of disease. Counseling  Your health care provider may ask you questions about your: Alcohol use. Tobacco use. Drug use. Emotional well-being. Home and relationship well-being. Sexual activity. Eating habits. History of falls. Memory and ability to understand (cognition). Work and work Astronomer. Reproductive health. Screening  You may have the following tests or measurements: Height, weight, and BMI. Blood pressure. Lipid and cholesterol levels. These may be checked every 5 years, or more frequently  if you are over 86 years old. Skin check. Lung cancer screening. You may have this screening every year starting at age 43 if you have a 30-pack-year history of smoking and currently smoke or have quit within the past 15 years. Fecal occult blood test (FOBT) of the stool. You may have this test every year starting at age 67. Flexible sigmoidoscopy or colonoscopy. You may have a sigmoidoscopy every 5 years or a colonoscopy  every 10 years starting at age 69. Hepatitis C blood test. Hepatitis B blood test. Sexually transmitted disease (STD) testing. Diabetes screening. This is done by checking your blood sugar (glucose) after you have not eaten for a while (fasting). You may have this done every 1-3 years. Bone density scan. This is done to screen for osteoporosis. You may have this done starting at age 50. Mammogram. This may be done every 1-2 years. Talk to your health care provider about how often you should have regular mammograms. Talk with your health care provider about your test results, treatment options, and if necessary, the need for more tests. Vaccines  Your health care provider may recommend certain vaccines, such as: Influenza vaccine. This is recommended every year. Tetanus, diphtheria, and acellular pertussis (Tdap, Td) vaccine. You may need a Td booster every 10 years. Zoster vaccine. You may need this after age 70. Pneumococcal 13-valent conjugate (PCV13) vaccine. One dose is recommended after age 82. Pneumococcal polysaccharide (PPSV23) vaccine. One dose is recommended after age 3. Talk to your health care provider about which screenings and vaccines you need and how often you need them. This information is not intended to replace advice given to you by your health care provider. Make sure you discuss any questions you have with your health care provider. Document Released: 12/02/2015 Document Revised: 07/25/2016 Document Reviewed: 09/06/2015 Elsevier Interactive Patient Education  2017 [employer].  Fall Prevention in the Home Falls can cause injuries. They can happen to people of all ages. There are many things you can do to make your home safe and to help prevent falls. What can I do on the outside of my home? Regularly fix the edges of walkways and driveways and fix any cracks. Remove anything that might make you trip as you walk through a door, such as a raised step or threshold. Trim  any bushes or trees on the path to your home. Use bright outdoor lighting. Clear any walking paths of anything that might make someone trip, such as rocks or tools. Regularly check to see if handrails are loose or broken. Make sure that both sides of any steps have handrails. Any raised decks and porches should have guardrails on the edges. Have any leaves, snow, or ice cleared regularly. Use sand or salt on walking paths during winter. Clean up any spills in your garage right away. This includes oil or grease spills. What can I do in the bathroom? Use night lights. Install grab bars by the toilet and in the tub and shower. Do not use towel bars as grab bars. Use non-skid mats or decals in the tub or shower. If you need to sit down in the shower, use a plastic, non-slip stool. Keep the floor dry. Clean up any water that spills on the floor as soon as it happens. Remove soap buildup in the tub or shower regularly. Attach bath mats securely with double-sided non-slip rug tape. Do not have throw rugs and other things on the floor that can make you trip. What can I  do in the bedroom? Use night lights. Make sure that you have a light by your bed that is easy to reach. Do not use any sheets or blankets that are too big for your bed. They should not hang down onto the floor. Have a firm chair that has side arms. You can use this for support while you get dressed. Do not have throw rugs and other things on the floor that can make you trip. What can I do in the kitchen? Clean up any spills right away. Avoid walking on wet floors. Keep items that you use a lot in easy-to-reach places. If you need to reach something above you, use a strong step stool that has a grab bar. Keep electrical cords out of the way. Do not use floor polish or wax that makes floors slippery. If you must use wax, use non-skid floor wax. Do not have throw rugs and other things on the floor that can make you trip. What can I  do with my stairs? Do not leave any items on the stairs. Make sure that there are handrails on both sides of the stairs and use them. Fix handrails that are broken or loose. Make sure that handrails are as long as the stairways. Check any carpeting to make sure that it is firmly attached to the stairs. Fix any carpet that is loose or worn. Avoid having throw rugs at the top or bottom of the stairs. If you do have throw rugs, attach them to the floor with carpet tape. Make sure that you have a light switch at the top of the stairs and the bottom of the stairs. If you do not have them, ask someone to add them for you. What else can I do to help prevent falls? Wear shoes that: Do not have high heels. Have rubber bottoms. Are comfortable and fit you well. Are closed at the toe. Do not wear sandals. If you use a stepladder: Make sure that it is fully opened. Do not climb a closed stepladder. Make sure that both sides of the stepladder are locked into place. Ask someone to hold it for you, if possible. Clearly mark and make sure that you can see: Any grab bars or handrails. First and last steps. Where the edge of each step is. Use tools that help you move around (mobility aids) if they are needed. These include: Canes. Walkers. Scooters. Crutches. Turn on the lights when you go into a dark area. Replace any light bulbs as soon as they burn out. Set up your furniture so you have a clear path. Avoid moving your furniture around. If any of your floors are uneven, fix them. If there are any pets around you, be aware of where they are. Review your medicines with your doctor. Some medicines can make you feel dizzy. This can increase your chance of falling. Ask your doctor what other things that you can do to help prevent falls. This information is not intended to replace advice given to you by your health care provider. Make sure you discuss any questions you have with your health care  provider. Document Released: 09/01/2009 Document Revised: 04/12/2016 Document Reviewed: 12/10/2014 Elsevier Interactive Patient Education  2017 [employer].

## 2023-05-26 NOTE — Progress Notes (Unsigned)
Subjective:   Cassandra Reid is a 67 y.o. female who presents for Medicare Annual (Subsequent) preventive examination.  Visit Complete: {VISITMETHOD:[phone]}  Patient Medicare AWV questionnaire was completed by the patient on ***; I have confirmed that all information answered by patient is correct and no changes since this date.  Review of Systems    ***       Objective:    There were no vitals filed for this visit. There is no height or weight on file to calculate BMI.     03/18/2023   11:17 AM 02/12/2023    4:21 PM 01/07/2023   11:00 AM 12/07/2022   11:37 AM 11/23/2022    2:33 PM 05/28/2022    9:17 AM 04/17/2022    9:36 AM  Advanced Directives  Does Patient Have a Medical Advance Directive? No No No No Yes Yes Yes  Type of Agricultural consultant;Living will Living will   Does patient want to make changes to medical advance directive?     No - Patient declined    Copy of Healthcare Power of Attorney in Chart?     Yes - validated most recent copy scanned in chart (See row information)      Current Medications (verified) Outpatient Encounter Medications as of 05/27/2023  Medication Sig   albuterol (VENTOLIN HFA) 108 (90 Base) MCG/ACT inhaler INHALE 2 PUFFS INTO THE LUNGS 2 (TWO) TIMES DAILY. PLUS 2 PUFFS AS NEEDED   amLODipine (NORVASC) 5 MG tablet TAKE 1 TABLET EVERY DAY   Ascorbic Acid (VITAMIN C ADULT GUMMIES PO) Take 1 each by mouth daily in the afternoon.   BD PEN NEEDLE NANO 2ND GEN 32G X 4 MM MISC USE WITH INSULIN   blood glucose meter kit and supplies Dispense based on patient and insurance preference. Use up to four times daily as directed. (FOR ICD-10 E10.9, E11.9).   budesonide-formoterol (BREYNA) 80-4.5 MCG/ACT inhaler Inhale 2 puffs into the lungs in the morning and at bedtime. And PRN shortness of breath. (Patient not taking: Reported on 05/20/2023)   Continuous Blood Gluc Sensor (DEXCOM G6 SENSOR) MISC Use as directed to check blood  sugar.   Continuous Blood Gluc Transmit (DEXCOM G6 TRANSMITTER) MISC Use as directed to check blood sugar.   dapagliflozin propanediol (FARXIGA) 10 MG TABS tablet Take 1 tablet (10 mg total) by mouth daily.   dicyclomine (BENTYL) 20 MG tablet Take 1 tablet (20 mg total) by mouth 2 (two) times daily as needed for spasms.   DULoxetine (CYMBALTA) 60 MG capsule Take 1 capsule (60 mg total) by mouth daily.   ezetimibe (ZETIA) 10 MG tablet TAKE 1 TABLET EVERY DAY   famotidine (PEPCID) 20 MG tablet Take 1 tablet (20 mg total) by mouth daily.   FIBER SELECT GUMMIES PO Take 1 tablet by mouth daily in the afternoon.   fluticasone (FLONASE) 50 MCG/ACT nasal spray Place 1 spray into both nostrils daily as needed for allergies or rhinitis.   furosemide (LASIX) 40 MG tablet Take 1 tablet (40 mg total) by mouth as needed for edema. (Patient taking differently: Take 40 mg by mouth daily as needed (ankle swelling.).)   gabapentin (NEURONTIN) 600 MG tablet TAKE 0.5-1 TABLET (300-600 MG TOTAL) BY MOUTH 3 (THREE) TIMES DAILY. (Patient taking differently: Take 600 mg by mouth daily.)   insulin aspart (FIASP) 100 UNIT/ML FlexTouch Pen Inject 14-16 Units into the skin daily with breakfast AND 12-14 Units daily with lunch AND  10-12 Units daily with supper. This is a patient assistance medication. Patient may not be approved and/or have medication. Please ask and verify when performing med review..   insulin degludec (TRESIBA) 100 UNIT/ML FlexTouch Pen Inject 42 Units into the skin daily. This is a patient assistance medication. Patient may not be approved and/or have medication. Please ask and verify when performing med review. (Patient taking differently: Inject 44 Units into the skin daily. This is a patient assistance medication. Patient may not be approved and/or have medication. Please ask and verify when performing med review.)   lisinopril (ZESTRIL) 40 MG tablet Take 1 tablet (40 mg total) by mouth daily. (Patient  taking differently: Take 40 mg by mouth in the morning.)   loratadine (CLARITIN) 10 MG tablet TAKE 1 TABLET EVERY DAY   LORazepam (ATIVAN) 0.5 MG tablet TAKE 1 TABLET EVERY 8 HOURS AS NEEDED FOR ANXIETY   Magnesium 500 MG TABS Take 500 mg by mouth daily in the afternoon.   metoprolol succinate (TOPROL-XL) 50 MG 24 hr tablet Take 1 tablet (50 mg total) by mouth daily. Take with or immediately following a meal.   Multiple Vitamin (MULTIVITAMIN WITH MINERALS) TABS tablet Take 1 tablet by mouth in the morning.   OneTouch Delica Lancets 33G MISC Please use to check blood sugar up to 4 times daily. E11.42   Oyster Shell (OYSTER CALCIUM) 500 MG TABS tablet Take 500 mg of elemental calcium by mouth daily in the afternoon.   pantoprazole (PROTONIX) 40 MG tablet TAKE 1 TABLET EVERY DAY   potassium chloride SA (KLOR-CON M) 20 MEQ tablet TAKE 1 TABLET AS NEEDED (TAKE AS DIRECTED WITH LASIX AS NEEDED FOR EDEMA)   Semaglutide, 2 MG/DOSE, (OZEMPIC, 2 MG/DOSE,) 8 MG/3ML SOPN Inject 2 mg into the skin once a week.   sucralfate (CARAFATE) 1 g tablet TAKE 1 TABLET FOUR TIMES DAILY, WITH MEALS AND AT BEDTIME (OKAY TO TRIAL OFF AS INSTRUCTED) (Patient taking differently: Take 1 g by mouth in the morning and at bedtime.)   traZODone (DESYREL) 50 MG tablet TAKE 1/2 TABLET BY MOUTH AT BEDTIME AS NEEDED FOR SLEEP. (Patient not taking: Reported on 05/20/2023)   TRUE METRIX BLOOD GLUCOSE TEST test strip TEST BLOOD SUGAR AS INSTRUCTED   No facility-administered encounter medications on file as of 05/27/2023.    Allergies (verified) Patient has no known allergies.   History: Past Medical History:  Diagnosis Date   Acute pain of left shoulder 04/03/2022   Allergic rhinitis 09/26/2018   Anosmia    Anosmia    Arthritis    Asthma    Balance problems 09/22/2021   C. difficile colitis 07/18/2018   Cerebral aneurysm without rupture    Chest pain 03/26/2020   Chronic pain syndrome 04/23/2019   CKD stage 3 due to type 2  diabetes mellitus 04/15/2019   DDD (degenerative disc disease), cervical 04/16/2019   Encephalomalacia on imaging study 08/25/2020   Brain MRI 08/25/21: Few scattered areas of encephalomalacia and gliosis within the overlying left frontal lobe most likely postoperative in nature.   Fatty liver 05/31/2020   Fibromyalgia    Former smoker    Generalized anxiety disorder    GERD (gastroesophageal reflux disease)    Heart murmur    History of chicken pox    History of colon polyps    Hyperlipidemia associated with type 2 diabetes mellitus 07/18/2018   Hypertension associated with diabetes 07/18/2018   IBS (irritable bowel syndrome)    Incontinence 09/22/2021  Bowel and bladder.    Ischemic brain injury 05/29/2021   Lacunar infarction 05/25/2021   Brain MRI 08/25/21: Few small remote lacunar infarcts noted at the right caudate and left internal capsule   Legally blind    Major depressive disorder    Migraines    Myalgia 04/23/2019   OSA (obstructive sleep apnea) 07/18/2018   not using CPAP machine (broken)   Osteoporosis 01/18/2021   Paroxysmal atrial tachycardia    On telemetry   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   Type 2 diabetes mellitus with diabetic polyneuropathy, with long-term current use of insulin 07/18/2018   has dexcom G7  - sensor left upper arm   Past Surgical History:  Procedure Laterality Date   ABDOMINAL HYSTERECTOMY  2012   APPENDECTOMY  1972   CEREBRAL ANEURYSM REPAIR Left 2004   Aneurysm clip in place near the level of the left ICA on Brain MRI 08/2020. Also coiling performed   CHOLECYSTECTOMY  2015   COLONOSCOPY  11/24/2020   in [facility]   CRANIOTOMY Left    left pterional craniotomy   ETHMOIDECTOMY Left 11/23/2022   Procedure: LEFT ANTERIOR ETHMOIDECTOMY;  Surgeon: Laren Boom, DO;  Location: [facility] OR;  Service: ENT;  Laterality: Left;   MAXILLARY ANTROSTOMY Left  11/23/2022   Procedure: MAXILLARY ANTROSTOMY;  Surgeon: Laren Boom, DO;  Location: [facility] OR;  Service: ENT;  Laterality: Left;   MENISCUS REPAIR Bilateral    NASAL TURBINATE REDUCTION Bilateral 11/23/2022   Procedure: Bilateral turbinate reduction;  Surgeon: Laren Boom, DO;  Location: [facility] OR;  Service: ENT;  Laterality: Bilateral;   SINUS ENDO W/FUSION Left 11/23/2022   Procedure: Functional endoscopic sinus surgery with navigation.;  Surgeon: Laren Boom, DO;  Location: [facility] OR;  Service: ENT;  Laterality: Left;   UPPER GI ENDOSCOPY  11/24/2020   in [facility]   Family History  Problem Relation Age of Onset   Alcohol abuse Mother    Diabetes Mother    Hypertension Mother    Kidney disease Mother    COPD Father    Diabetes Father    Early death Father    Heart disease Father    Hyperlipidemia Father    Hypertension Father    Diabetes Sister    Hypertension Sister    Breast cancer Sister    Diabetes Sister    Hypertension Sister    Kidney disease Sister    Cancer Maternal Grandmother    Alcohol abuse Maternal Grandfather    Cancer Maternal Grandfather    Cancer Paternal Grandmother    Cancer Paternal Grandfather    Social History   Socioeconomic History   Marital status: Divorced    Spouse name: John   Number of children: 3   Years of education: 14   Highest education level: Associate degree: occupational, Scientist, product/process development, or vocational program  Occupational History   Occupation: disabled  Tobacco Use   Smoking status: Former    Packs/day: 1.00    Years: 3.00    Additional pack years: 0.00    Total pack years: 3.00    Types: Cigarettes    Quit date: 06/1967    Years since quitting: 55.9    Passive exposure: Past   Smokeless tobacco: Never  Vaping Use   Vaping Use: Never used  Substance and Sexual Activity   Alcohol use: Never   Drug use: Not Currently   Sexual  activity: Not Currently    Birth control/protection: Surgical    Comment: Hysterectomy  Other  Topics Concern   Not on file  Social History Narrative   Patient lives in [place].    Patient walks her dog daily for exercise.    Patient is separated from her husband.   Patient does not drive. Uses home delivery services and ubers.    Right handed   One story apartment   Caffeine on daily   Social Determinants of Health   Financial Resource Strain: Low Risk  (02/08/2023)   Overall Financial Resource Strain (CARDIA)    Difficulty of Paying Living Expenses: Not very hard  Food Insecurity: No Food Insecurity (02/08/2023)   Hunger Vital Sign    Worried About Running Out of Food in the Last Year: Never true    Ran Out of Food in the Last Year: Never true  Transportation Needs: Unmet Transportation Needs (02/08/2023)   PRAPARE - Transportation    Lack of Transportation (Medical): Yes    Lack of Transportation (Non-Medical): Yes  Physical Activity: Insufficiently Active (02/08/2023)   Exercise Vital Sign    Days of Exercise per Week: 2 days    Minutes of Exercise per Session: 10 min  Stress: Stress Concern Present (02/08/2023)   [employer] of Occupational Health - Occupational Stress Questionnaire    Feeling of Stress : Very much  Social Connections: Socially Isolated (02/08/2023)   Social Connection and Isolation Panel [NHANES]    Frequency of Communication with Friends and Family: Never    Frequency of Social Gatherings with Friends and Family: Never    Attends Religious Services: Never    Database administrator or Organizations: No    Attends Engineer, structural: Not on file    Marital Status: Divorced    Tobacco Counseling Counseling given: Not Answered   Clinical Intake:              How often do you need to have someone help you when you read instructions, pamphlets, or other written materials from your doctor or pharmacy?: (P) 1 - Never         Activities of Daily Living    05/24/2023    9:49 AM 11/23/2022    2:33 PM  In your present  state of health, do you have any difficulty performing the following activities:  Hearing? 0 0  Vision? 0 0  Difficulty concentrating or making decisions? 1 0  Walking or climbing stairs? 1 0  Dressing or bathing? 0 0  Doing errands, shopping? 1 0  Preparing Food and eating ? N   Using the Toilet? N   In the past six months, have you accidently leaked urine? Y   Do you have problems with loss of bowel control? Y   Managing your Medications? Y   Managing your Finances? N   Housekeeping or managing your Housekeeping? N     Patient Care Team: Glendale Chard, DO as PCP - General (Family Medicine) Jens Som Madolyn Frieze, MD as PCP - Cardiology (Cardiology) Shamleffer, Konrad Dolores, MD as Consulting Physician (Endocrinology) Drema Dallas, DO as Consulting Physician (Neurology) Tomma Lightning, MD as Consulting Physician (Pulmonary Disease) Hilbert Corrigan, PsyD (Inactive) (Psychology)  Indicate any recent Medical Services you may have received from other than [facility] providers in the past year (date may be approximate).     Assessment:   This is a routine wellness examination for Cienna.  Hearing/Vision screen No results found.  Dietary issues and exercise activities discussed:     Goals Addressed   None    Depression Screen    03/18/2023   11:16 AM 01/07/2023   10:58 AM 12/07/2022   11:36 AM 09/21/2022    3:52 PM 04/13/2022    2:55 PM 03/19/2022   12:40 PM 01/08/2022    2:12 PM  PHQ 2/9 Scores  PHQ - 2 Score 5 5 4 5 6 6 6   PHQ- 9 Score 11 14 14 15 11 13 13     Fall Risk    05/24/2023    9:49 AM 01/07/2023   10:59 AM 09/21/2022    3:44 PM 05/28/2022    9:16 AM 04/13/2022    2:55 PM  Fall Risk   Falls in the past year? 1 1 1 1  0  Number falls in past yr: 1 0 1 1 0  Injury with Fall? 1 0 1 1 0  Risk for fall due to :   History of fall(s)    Follow up   Falls evaluation completed      MEDICARE RISK AT HOME:   TIMED UP AND GO:  Was the test performed?  No     Cognitive Function:      05/25/2021    4:16 PM  Montreal Cognitive Assessment   Visuospatial/ Executive (0/5) 0  Naming (0/3) 0  Attention: Read list of digits (0/2) 0  Attention: Read list of letters (0/1) 1  Attention: Serial 7 subtraction starting at 100 (0/3) 3  Language: Repeat phrase (0/2) 0  Language : Fluency (0/1) 1  Abstraction (0/2) 1  Delayed Recall (0/5) 2  Orientation (0/6) 6  Total 14  Adjusted Score (based on education) 14      03/19/2022   12:47 PM  6CIT Screen  What Year? 0 points  What month? 0 points  What time? 0 points  Count back from 20 0 points  Months in reverse 0 points  Repeat phrase 0 points  Total Score 0 points    Immunizations Immunization History  Administered Date(s) Administered   Influenza, Quadrivalent, Recombinant, Inj, Pf 07/30/2019   Influenza,inj,Quad PF,6+ Mos 07/18/2018, 07/28/2021   Influenza-Unspecified 07/30/2019, 09/14/2020   PFIZER(Purple Top)SARS-COV-2 Vaccination 02/03/2020, 02/24/2020, 09/15/2020, 04/05/2021   Pfizer Covid-19 Vaccine Bivalent Booster 56yrs & up 07/09/2022   Respiratory Syncytial Virus Vaccine,Recomb Aduvanted(Arexvy) 07/16/2022   Tdap 01/24/2016   Zoster Recombinant(Shingrix) 01/23/2022, 04/24/2022    TDAP status: Up to date  {Pneumococcal vaccine status:[id number]}  Covid-19 vaccine status: Information provided on how to obtain vaccines.   Qualifies for Shingles Vaccine? Yes   Zostavax completed No   Shingrix Completed?: Yes  Screening Tests Health Maintenance  Topic Date Due   FOOT EXAM  02/22/2022   COVID-19 Vaccine (6 - 2023-24 season) 09/03/2022   Pneumonia Vaccine 80+ Years old (1 of 1 - PCV) Never done   Medicare Annual Wellness (AWV)  03/20/2023   INFLUENZA VACCINE  06/20/2023   HEMOGLOBIN A1C  09/17/2023   Diabetic kidney evaluation - Urine ACR  09/22/2023   Diabetic kidney evaluation - eGFR measurement  03/17/2024   MAMMOGRAM  04/15/2025   Colonoscopy  01/02/2026    DTaP/Tdap/Td (2 - Td or Tdap) 01/23/2026   DEXA SCAN  Completed   Hepatitis C Screening  Completed   HIV Screening  Completed   Zoster Vaccines- Shingrix  Completed   HPV VACCINES  Aged Out   OPHTHALMOLOGY EXAM  Discontinued    Health Maintenance  Health Maintenance  Due  Topic Date Due   FOOT EXAM  02/22/2022   COVID-19 Vaccine (6 - 2023-24 season) 09/03/2022   Pneumonia Vaccine 76+ Years old (1 of 1 - PCV) Never done   Medicare Annual Wellness (AWV)  03/20/2023    Colorectal cancer screening: Type of screening: Colonoscopy. Completed 01/02/21. Repeat every 5 years  Mammogram status: Completed 04/16/23. Repeat every year  Bone Density status: Completed 10/04/22. Results reflect: Bone density results: OSTEOPENIA. Repeat every 2 years.  Lung Cancer Screening: (Low Dose CT Chest recommended if Age 60-80 years, 20 pack-year currently smoking OR have quit w/in 15years.) does not qualify.   Lung Cancer Screening Referral: n/a  Additional Screening:  Hepatitis C Screening: does qualify; Completed 09/26/18  Vision Screening: Recommended annual ophthalmology exams for early detection of glaucoma and other disorders of the eye. Is the patient up to date with their annual eye exam?  {YES/NO:21197} Who is the provider or what is the name of the office in which the patient attends annual eye exams? *** If pt is not established with a provider, would they like to be referred to a provider to establish care? {YES/NO:21197}.   Dental Screening: Recommended annual dental exams for proper oral hygiene  Diabetic Foot Exam: Diabetic Foot Exam: Overdue, Pt has been advised about the importance in completing this exam. Pt is scheduled for diabetic foot exam on at next office visit .  Community Resource Referral / Chronic Care Management: CRR required this visit?  {YES/NO:21197}  CCM required this visit?  {CCM Required [phone]}     Plan:     I have personally reviewed and noted  the following in the patient's chart:   Medical and social history Use of alcohol, tobacco or illicit drugs  Current medications and supplements including opioid prescriptions. {Opioid [phone]} Functional ability and status Nutritional status Physical activity Advanced directives List of other physicians Hospitalizations, surgeries, and ER visits in previous 12 months Vitals Screenings to include cognitive, depression, and falls Referrals and appointments  In addition, I have reviewed and discussed with patient certain preventive protocols, quality metrics, and best practice recommendations. A written personalized care plan for preventive services as well as general preventive health recommendations were provided to patient.     Kandis Fantasia [place], [place]   4/0/9811   After Visit Summary: {CHL AMB AWV After Visit Summary:[phone]}  Nurse Notes: ***

## 2023-05-27 ENCOUNTER — Ambulatory Visit (INDEPENDENT_AMBULATORY_CARE_PROVIDER_SITE_OTHER): Payer: Medicare PPO

## 2023-05-27 VITALS — Ht 61.0 in | Wt 179.0 lb

## 2023-05-27 DIAGNOSIS — M5481 Occipital neuralgia: Secondary | ICD-10-CM | POA: Diagnosis not present

## 2023-05-27 DIAGNOSIS — Z0000 Encounter for general adult medical examination without abnormal findings: Secondary | ICD-10-CM

## 2023-05-29 ENCOUNTER — Telehealth: Payer: Self-pay | Admitting: Pharmacist

## 2023-05-29 ENCOUNTER — Telehealth: Payer: Self-pay | Admitting: *Deleted

## 2023-05-29 ENCOUNTER — Encounter: Payer: Self-pay | Admitting: Pharmacist

## 2023-05-29 DIAGNOSIS — Z794 Long term (current) use of insulin: Secondary | ICD-10-CM

## 2023-05-29 MED ORDER — DAPAGLIFLOZIN PROPANEDIOL 10 MG PO TABS
10.0000 mg | ORAL_TABLET | Freq: Every day | ORAL | 3 refills | Status: DC
Start: 2023-05-29 — End: 2024-03-13

## 2023-05-29 NOTE — Telephone Encounter (Signed)
-----   Message from Otho Najjar, CPhT sent at 05/20/2023  3:02 PM EDT ----- Regarding: RE: Inhaler coverage inquiry Deductible is met, however 30 day supply of all inhalers are still high:  Symbicort is $192 Dulera not covered/preferred Breo $211 (available for PAP thru [employer], pt will have to have spent $600 on meds for year) Arnuity $196 (available for PAP thru [employer], pt will have to have spent $600 on meds for year)   ----- Message ----- From: Kathrin Ruddy, RPH-CPP Sent: 05/20/2023   1:06 PM EDT To: Otho Najjar, CPhT Subject: Inhaler coverage inquiry                       Durward Mallard,   Could you run a test claim to determine if any change in coverage?  Symbicort or Dulera???  I discussed with the patient an opportunity to apply for assistance through [place] med assist.  (I am unsure if she might qualify as she dose have some insurance.

## 2023-05-29 NOTE — Telephone Encounter (Signed)
  Attempted to contact patient for follow-up of two issues.     Left HIPAA compliant voice mail requesting call back to direct phone: [phone]  Made second attempt at 16:10 and left message that I would communicate via mychart.   Communicated prescription for Marcelline Deist had been sent to pharmacy as requested.  Also asked patient to determine 2024 drug spend from her pharmacy and determine if this amount was > $600.  I asked her to let both Siri Cole and I know if she determined her drug spend was > $600   Total time with patient call and documentation of interaction: 16 minutes.  Additional follow-up phone call planned: None

## 2023-05-29 NOTE — Telephone Encounter (Signed)
   Telephone encounter was:  Unsuccessful.  05/29/2023 Name: Cassandra Reid MRN: [id number] DOB: 1955/12/09  Unsuccessful outbound call made today to assist with:  Transportation Needs   Outreach Attempt:  1st Attempt  A HIPAA compliant voice message was left requesting a return call.  Instructed patient to call back at [phone].Yehuda Mao Greenauer -[facility] [place], Population Health [phone] [address] [place] , [place] [address] Email : [email or web address]

## 2023-05-30 ENCOUNTER — Telehealth: Payer: Self-pay | Admitting: *Deleted

## 2023-05-30 NOTE — Progress Notes (Signed)
  Care Coordination   Note   05/30/2023 Name: Cassandra Reid MRN: [id number] DOB: 01-Nov-1956  Cassandra Reid is a 67 y.o. year old female who sees Glendale Chard, DO for primary care. I reached out to [name] by phone today to offer care coordination services.  Cassandra Reid was given information about Care Coordination services today including:   The Care Coordination services include support from the care team which includes your Nurse Coordinator, Clinical Social Worker, or Pharmacist.  The Care Coordination team is here to help remove barriers to the health concerns and goals most important to you. Care Coordination services are voluntary, and the patient may decline or stop services at any time by request to their care team member.   Care Coordination Consent Status: Patient agreed to services and verbal consent obtained.   Follow up plan:  Telephone appointment with care coordination team member scheduled for:  06/05/2023  Encounter Outcome:  Pt. Scheduled  Cassandra Reid, CCMA Care Coordination Care Guide Direct Dial: [phone]

## 2023-05-30 NOTE — Telephone Encounter (Signed)
Reviewed and agree with Dr Koval's plan.   

## 2023-05-30 NOTE — Telephone Encounter (Signed)
   Telephone encounter was:  Successful.  05/30/2023 Name: Cassandra Reid MRN: [id number] DOB: Dec 06, 1955  Cassandra Reid is a 67 y.o. year old female who is a primary care patient of Glendale Chard, DO . The community resource team was consulted for assistance with Transportation Needs   Care guide performed the following interventions: Patient provided with information about care guide support team and interviewed to confirm resource needs.Scheduled transportation no human[employer] benefits for transportation , patient very emotional and depressed will refer to LCSW put in [place] [address] referral for her as she is very isolated and lonely with the inability to get some of her medicines   Follow Up Plan:  no further follow up needed   Alois Cliche -[facility] [place], Population Health [phone] [address] [place] , [place] [address] Email : [email or web address]

## 2023-06-05 ENCOUNTER — Telehealth: Payer: Self-pay

## 2023-06-05 ENCOUNTER — Ambulatory Visit: Payer: Self-pay | Admitting: *Deleted

## 2023-06-05 DIAGNOSIS — F320 Major depressive disorder, single episode, mild: Secondary | ICD-10-CM

## 2023-06-05 DIAGNOSIS — J984 Other disorders of lung: Secondary | ICD-10-CM

## 2023-06-05 NOTE — Telephone Encounter (Signed)
Mailed [employer] patient assistance application to patients home for possible assistance for Breo or Arnuity inhaler.

## 2023-06-06 ENCOUNTER — Telehealth: Payer: Self-pay | Admitting: *Deleted

## 2023-06-06 NOTE — Progress Notes (Signed)
  Care Coordination Note  06/06/2023 Name: Cassandra Reid MRN: [id number] DOB: [birth year]  Cassandra Reid is a 67 y.o. year old female who is a primary care patient of Glendale Chard, DO and is actively engaged with the care management team. I reached out to [name] by phone today to assist with scheduling an initial visit with the BSW  Follow up plan: Telephone appointment with care management team member scheduled for: 06/13/2023  Burman Nieves, [facility] Care Coordination Care Guide Direct Dial: [phone]

## 2023-06-06 NOTE — Telephone Encounter (Signed)
LVM message for patient to call office and schedule an appointment either in person or virtual per Dr. Hyacinth Meeker.  Glennie Hawk, CMA

## 2023-06-06 NOTE — Telephone Encounter (Signed)
-----   Message from Glendale Chard sent at 06/06/2023 12:47 PM EDT ----- More than happy to prescribe an anti-depressant if the patient can follow up with me in clinic. If transportation is an issue, I am willing to do a virtual visit.   Green team - if you will call and schedule this patient for depression.   Glendale Chard, DO [facility], PGY-2 06/06/23 12:48 PM ----- Message ----- From: Buck Mam, LCSW Sent: 06/06/2023  12:03 PM EDT To: Glendale Chard, DO  Dr Hyacinth Meeker-  due to financial strains and transportation barriers, patient declines counseling referral to be made at this time. She is willing and interested in considering an anti-depressant RX if you agree and will prescribe. Thanks,Janet

## 2023-06-06 NOTE — Patient Outreach (Signed)
Care Coordination   Initial Visit Note Late Entry  06/06/2023 Name: Cassandra Reid MRN: [id number] DOB: November 07, 1956  Cassandra Reid is a 67 y.o. year old female who sees Glendale Chard, DO for primary care. I spoke with  Cassandra Reid by phone today.  What matters to the patients health and wellness today?  Lonely, isolation and financial strains causing depression symptoms. Wants to seek housing more affordable and resources/support to aide in her overall health and mental health.    Goals Addressed             This Visit's Progress    Reduce depression symptoms and enhance quality of life         Mental Health:  (Status: New goal.) Evaluation of current treatment plan related to Depression: depressed mood loss of energy/fatigue anhedonia loneliness, Stress, financial/housing concerns and isolated Motivational Interviewing employed Depression screen reviewed  Solution-Focused Strategies employed:  Active listening / Reflection utilized  Emotional Support Provided Problem Solving /Task Center strategies reviewed Participation in counseling encouraged :transportation and copay concerns- Call [employer] to inquire about services, resources and support for mental health and other needs ,  Social Determinants of Health in Patient with :  (Status: New goal.) SDOH assessments completed: Housing , Transportation, Corporate treasurer , Food Insecurity , Depression  , and Social Connections Evaluation of current treatment plan related to unmet needs SCAT transportation   [employer] options  [place] [employer] to apply for Section 8 housing [phone] www.nchousingsearch.org Greater Dietitian (select "find help") - Find Food  [place], [place] (ghpfa.org) or download the free app to your smart phone Food and Nutrition Services - [phone] or apply online at [facility] - ePASS Senior Line at [employer]) -  provides transportation within city limits of [place] - contact [phone] to initiate application process [employer] (must be over age 30) - Engineer, maintenance at [phone] [employer] and [employer] - provides transportation for residents who reside in [place] outside the [place] area - Contact at [phone] [employer] - [employer] at [phone] [employer] - to purchase insurance from the Marketplace or enroll in [place] benefits [phone] [facility] Referral Service Dial [phone] or [phone] to speak with a call specialist.    Housing  ( )  Financial hardship resource options ( ) Referral to care guide ( )  Solution-Focused Strategies employed:  Emotional Support Provided Problem Solving /Task Center strategies reviewed Collaborated with Pensions consultant for assistance            SDOH assessments and interventions completed:  Yes  SDOH Interventions Today    Flowsheet Row Most Recent Value  SDOH Interventions   Stress Interventions Provide Counseling, Other (Comment)  [finances, transportation, housing expenses, loneliness/isolation, etc]        Care Coordination Interventions:  Yes, provided  Interventions Today    Flowsheet Row Most Recent Value  Chronic Disease   Chronic disease during today's visit Other  [depression]  General Interventions   General Interventions Discussed/Reviewed General Interventions Discussed, General Interventions Reviewed, Community Resources  Exercise Interventions   Exercise Discussed/Reviewed Exercise Discussed  [CSW discussed Sr Ctr activities as well as local Y programs to consider]  Education Interventions   Education Provided Provided Education  Provided Verbal Education On Mental Health/Coping with Illness, [employer]  [Pt admits to be depressed, lonely, isolated and financial stressors. Pt  declines counseling referral due to transportation  and copay. Pt denies SI. Pt is open to considering RX to aide in her symptoms- will alert PCP to determine/treat.]  Mental Health Interventions   Mental Health Discussed/Reviewed Mental Health Discussed, Coping Strategies, Mental Health Reviewed, Depression, Other, Refer to Social Work for resources  Refer to Social Work for resources regarding Other  [housing, food insecurity, senior center/activities to reduce her isolation/loneliness]  Nutrition Interventions   Nutrition Discussed/Reviewed Nutrition Discussed  [Pt reports she gets Food Stamps]  Safety Interventions   Safety Discussed/Reviewed Safety Discussed  [Pt lives alone.No life alert. Reminded to call "[phone]" for mental health support 24/7]       Follow up plan: Follow up call scheduled for 06/14/23    Encounter Outcome:  Pt. Visit Completed

## 2023-06-06 NOTE — Patient Instructions (Signed)
Visit Information  Thank you for taking time to visit with me today. Please don't hesitate to contact me if I can be of assistance to you.   Following are the goals we discussed today:   Goals Addressed             This Visit's Progress    Reduce depression symptoms and enhance quality of life         Mental Health:  (Status: New goal.) Evaluation of current treatment plan related to Depression: depressed mood loss of energy/fatigue anhedonia loneliness, Stress, financial/housing concerns and isolated Motivational Interviewing employed Depression screen reviewed  Solution-Focused Strategies employed:  Active listening / Reflection utilized  Emotional Support Provided Problem Solving /Task Center strategies reviewed Participation in counseling encouraged :transportation and copay concerns- Call [employer] to inquire about services, resources and support for mental health and other needs ,  Social Determinants of Health in Patient with :  (Status: New goal.) SDOH assessments completed: Housing , Transportation, Corporate treasurer , Food Insecurity , Depression  , and Social Connections Evaluation of current treatment plan related to unmet needs SCAT transportation   [employer] options  [place] [employer] to apply for Section 8 housing 6072998835 www.nchousingsearch.org Greater Dietitian (select "find help") - Find Food  [place], [place] (ghpfa.org) or download the free app to your smart phone Food and Nutrition Services - [phone] or apply online at [facility] - ePASS Senior Line at [employer]) - provides transportation within city limits of [place] - contact [phone] to initiate application process [employer] (must be over age 80) - Engineer, maintenance at [phone] [employer] and [employer] - provides transportation for  residents who reside in [place] outside the [place] area - Contact at [phone] [employer] at [phone] [employer] - to purchase insurance from the Marketplace or enroll in [place] benefits [phone] [facility] Referral Service Dial [phone] or [phone] to speak with a call specialist.    Housing  ( )  Financial hardship resource options ( ) Referral to care guide ( )  Solution-Focused Strategies employed:  Emotional Support Provided Problem Solving /Task Center strategies reviewed Collaborated with Caregiver Connect for assistance            Our next appointment is by telephone on 06/14/23  Please call the care guide team at [phone] if you need to cancel or reschedule your appointment.   If you are experiencing a Mental Health or Behavioral Health Crisis or need someone to talk to, please call the Suicide and Crisis Lifeline: 988 call 911 Please let me know if you do decide to be referred to counselor. Happy to help arrange.    The patient verbalized understanding of instructions, educational materials, and care plan provided today and DECLINED offer to receive copy of patient instructions, educational materials, and care plan.   Telephone follow up appointment with care management team member scheduled for: 06/14/23 Reece Levy, MSW, LCSW Clinical Social Worker [employer] [phone]

## 2023-06-12 NOTE — Progress Notes (Signed)
HPI: FU MVP, palpitations and CP. Monitor 6/21 showed sinus with PACs, PVCs and brief atrial tachycardia. 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  Monitor August 2023 showed sinus rhythm with PACs, brief runs of PAT, PVCs and 3 beats nonsustained ventricular tachycardia.  Echocardiogram September 2023 showed normal LV function, grade 1 diastolic dysfunction.  Coronary CTA March 2024 showed calcium score 0 and no coronary disease; mid LAD myocardial bridge.  Since last seen, patient continues to have occasional chest tightness which is longstanding and unchanged.  She has dyspnea with more vigorous activities but not routine activities.  No orthopnea, PND or pedal edema.  No syncope.  Current Outpatient Medications  Medication Sig Dispense Refill   albuterol (VENTOLIN HFA) 108 (90 Base) MCG/ACT inhaler INHALE 2 PUFFS INTO THE LUNGS 2 (TWO) TIMES DAILY. PLUS 2 PUFFS AS NEEDED 18 each 1   amLODipine (NORVASC) 5 MG tablet TAKE 1 TABLET EVERY DAY 90 tablet 0   Ascorbic Acid (VITAMIN C ADULT GUMMIES PO) Take 1 each by mouth daily in the afternoon.     BD PEN NEEDLE NANO 2ND GEN 32G X 4 MM MISC USE WITH INSULIN 100 each 4   blood glucose meter kit and supplies Dispense based on patient and insurance preference. Use up to four times daily as directed. (FOR ICD-10 E10.9, E11.9). 1 each 0   Continuous Blood Gluc Sensor (DEXCOM G6 SENSOR) MISC Use as directed to check blood sugar. 4 each 11   Continuous Blood Gluc Transmit (DEXCOM G6 TRANSMITTER) MISC Use as directed to check blood sugar. 1 each 12   dapagliflozin propanediol (FARXIGA) 10 MG TABS tablet Take 1 tablet (10 mg total) by mouth daily. 90 tablet 3   DULoxetine (CYMBALTA) 60 MG capsule Take 1 capsule (60 mg total) by mouth daily. 90 capsule 3   ezetimibe (ZETIA) 10 MG tablet TAKE 1 TABLET EVERY DAY 90 tablet 3   famotidine (PEPCID) 20 MG tablet Take 1 tablet (20 mg  total) by mouth daily. 90 tablet 3   FIBER SELECT GUMMIES PO Take 1 tablet by mouth daily in the afternoon.     fluticasone (FLONASE) 50 MCG/ACT nasal spray Place 1 spray into both nostrils daily as needed for allergies or rhinitis. 9.9 mL 1   Fluticasone-Umeclidin-Vilant (TRELEGY ELLIPTA) 100-62.5-25 MCG/ACT AEPB Inhale 1 puff into the lungs daily for 14 days.     furosemide (LASIX) 40 MG tablet Take 1 tablet (40 mg total) by mouth as needed for edema. (Patient taking differently: Take 40 mg by mouth daily as needed (ankle swelling.).) 90 tablet 3   gabapentin (NEURONTIN) 600 MG tablet TAKE 0.5-1 TABLET (300-600 MG TOTAL) BY MOUTH 3 (THREE) TIMES DAILY. (Patient taking differently: Take 600 mg by mouth daily.) 270 tablet 3   insulin aspart (FIASP) 100 UNIT/ML FlexTouch Pen Inject 14-16 Units into the skin daily with breakfast AND 12-14 Units daily with lunch AND 10-12 Units daily with supper. This is a patient assistance medication. Patient may not be approved and/or have medication. Please ask and verify when performing med review.. 15 mL 3   insulin degludec (TRESIBA) 100 UNIT/ML FlexTouch Pen Inject 42 Units into the skin daily. This is a patient assistance medication. Patient may not be approved and/or have medication. Please ask and verify when performing med review. (Patient taking differently: Inject 44 Units into the skin daily. This is a patient assistance medication. Patient  may not be approved and/or have medication. Please ask and verify when performing med review.) 15 mL 3   lidocaine (LIDODERM) 5 % Place 1 patch onto the skin daily. Remove & Discard patch within 12 hours or as directed by MD 30 patch 0   lisinopril (ZESTRIL) 40 MG tablet Take 1 tablet (40 mg total) by mouth daily. (Patient taking differently: Take 40 mg by mouth in the morning.) 90 tablet 3   loratadine (CLARITIN) 10 MG tablet TAKE 1 TABLET EVERY DAY 90 tablet 3   LORazepam (ATIVAN) 0.5 MG tablet TAKE 1 TABLET EVERY 8 HOURS  AS NEEDED FOR ANXIETY 90 tablet 0   Magnesium 500 MG TABS Take 500 mg by mouth daily in the afternoon.     metoprolol succinate (TOPROL-XL) 50 MG 24 hr tablet Take 1 tablet (50 mg total) by mouth daily. Take with or immediately following a meal. 90 tablet 3   Multiple Vitamin (MULTIVITAMIN WITH MINERALS) TABS tablet Take 1 tablet by mouth in the morning.     OneTouch Delica Lancets 33G MISC Please use to check blood sugar up to 4 times daily. E11.42 100 each 12   Oyster Shell (OYSTER CALCIUM) 500 MG TABS tablet Take 500 mg of elemental calcium by mouth daily in the afternoon.     pantoprazole (PROTONIX) 40 MG tablet TAKE 1 TABLET EVERY DAY 90 tablet 3   potassium chloride SA (KLOR-CON M) 20 MEQ tablet TAKE 1 TABLET AS NEEDED (TAKE AS DIRECTED WITH LASIX AS NEEDED FOR EDEMA) 90 tablet 3   Semaglutide, 2 MG/DOSE, (OZEMPIC, 2 MG/DOSE,) 8 MG/3ML SOPN Inject 2 mg into the skin once a week. 9 mL 3   traZODone (DESYREL) 50 MG tablet TAKE 1/2 TABLET BY MOUTH AT BEDTIME AS NEEDED FOR SLEEP. 45 tablet 1   TRUE METRIX BLOOD GLUCOSE TEST test strip TEST BLOOD SUGAR AS INSTRUCTED 350 strip 3   sucralfate (CARAFATE) 1 g tablet TAKE 1 TABLET FOUR TIMES DAILY, WITH MEALS AND AT BEDTIME (OKAY TO TRIAL OFF AS INSTRUCTED) (Patient not taking: Reported on 06/25/2023) 240 tablet 1   No current facility-administered medications for this visit.     Past Medical History:  Diagnosis Date   Acute pain of left shoulder 04/03/2022   Allergic rhinitis 09/26/2018   Anosmia    Anosmia    Arthritis    Asthma    Balance problems 09/22/2021   C. difficile colitis 07/18/2018   Cerebral aneurysm without rupture    Chest pain 03/26/2020   Chronic pain syndrome 04/23/2019   CKD stage 3 due to type 2 diabetes mellitus 04/15/2019   DDD (degenerative disc disease), cervical 04/16/2019   Encephalomalacia on imaging study 08/25/2020   Brain MRI 08/25/21: Few scattered areas of encephalomalacia and gliosis within the overlying  left frontal lobe most likely postoperative in nature.   Fatty liver 05/31/2020   Fibromyalgia    Former smoker    Generalized anxiety disorder    GERD (gastroesophageal reflux disease)    Heart murmur    History of chicken pox    History of colon polyps    Hyperlipidemia associated with type 2 diabetes mellitus 07/18/2018   Hypertension associated with diabetes 07/18/2018   IBS (irritable bowel syndrome)    Incontinence 09/22/2021   Bowel and bladder.    Ischemic brain injury 05/29/2021   Lacunar infarction 05/25/2021   Brain MRI 08/25/21: Few small remote lacunar infarcts noted at the right caudate and left internal capsule   Legally  blind    Major depressive disorder    Migraines    Myalgia 04/23/2019   OSA (obstructive sleep apnea) 07/18/2018   not using CPAP machine (broken)   Osteoporosis 01/18/2021   Paroxysmal atrial tachycardia    On telemetry   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   Type 2 diabetes mellitus with diabetic polyneuropathy, with long-term current use of insulin 07/18/2018   has dexcom G7  - sensor left upper arm    Past Surgical History:  Procedure Laterality Date   ABDOMINAL HYSTERECTOMY  2012   APPENDECTOMY  1972   CEREBRAL ANEURYSM REPAIR Left 2004   Aneurysm clip in place near the level of the left ICA on Brain MRI 08/2020. Also coiling performed   CHOLECYSTECTOMY  2015   COLONOSCOPY  11/24/2020   in [place]   CRANIOTOMY Left    left pterional craniotomy   ETHMOIDECTOMY Left 11/23/2022   Procedure: LEFT ANTERIOR ETHMOIDECTOMY;  Surgeon: Laren Boom, DO;  Location: [facility] OR;  Service: ENT;  Laterality: Left;   EYE SURGERY  2023   MAXILLARY ANTROSTOMY Left 11/23/2022   Procedure: MAXILLARY ANTROSTOMY;  Surgeon: Laren Boom, DO;  Location: [facility] OR;  Service: ENT;  Laterality: Left;   MENISCUS REPAIR Bilateral    NASAL TURBINATE REDUCTION Bilateral  11/23/2022   Procedure: Bilateral turbinate reduction;  Surgeon: Laren Boom, DO;  Location: [facility] OR;  Service: ENT;  Laterality: Bilateral;   SINUS ENDO W/FUSION Left 11/23/2022   Procedure: Functional endoscopic sinus surgery with navigation.;  Surgeon: Laren Boom, DO;  Location: [facility] OR;  Service: ENT;  Laterality: Left;   TUBAL LIGATION  1990   UPPER GI ENDOSCOPY  11/24/2020   in [place]    Social History   Socioeconomic History   Marital status: Divorced    Spouse name: John   Number of children: 3   Years of education: 14   Highest education level: Associate degree: occupational, Scientist, product/process development, or vocational program  Occupational History   Occupation: disabled  Tobacco Use   Smoking status: Former    Current packs/day: 0.00    Average packs/day: 1 pack/day for 3.0 years (3.0 ttl pk-yrs)    Types: Cigarettes    Start date: 06/19/1964    Quit date: 06/20/1967    Years since quitting: 56.0    Passive exposure: Past   Smokeless tobacco: Never  Vaping Use   Vaping status: Never Used  Substance and Sexual Activity   Alcohol use: Never   Drug use: Not Currently    Types: Cocaine, Marijuana    Comment: 1976-1980   Sexual activity: Not Currently    Birth control/protection: Surgical    Comment: Hysterectomy  Other Topics Concern   Not on file  Social History Narrative   Patient lives in [place].    Patient walks her dog daily for exercise.    Patient is separated from her husband.   Patient does not drive. Uses home delivery services and ubers.    Right handed   One story apartment   Caffeine on daily   Social Determinants of Health   Financial Resource Strain: Medium Risk (05/27/2023)   Overall Financial Resource Strain (CARDIA)    Difficulty of Paying Living Expenses: Somewhat hard  Food Insecurity: No Food Insecurity (05/27/2023)   Hunger Vital Sign    Worried About Running Out of Food in the Last Year: Never true  Ran Out of Food in the Last Year: Never  true  Transportation Needs: Unmet Transportation Needs (05/27/2023)   PRAPARE - Transportation    Lack of Transportation (Medical): Yes    Lack of Transportation (Non-Medical): Yes  Physical Activity: Insufficiently Active (05/27/2023)   Exercise Vital Sign    Days of Exercise per Week: 3 days    Minutes of Exercise per Session: 30 min  Stress: Stress Concern Present (06/05/2023)   [employer] of Occupational Health - Occupational Stress Questionnaire    Feeling of Stress : Very much  Social Connections: Socially Isolated (05/27/2023)   Social Connection and Isolation Panel [NHANES]    Frequency of Communication with Friends and Family: Once a week    Frequency of Social Gatherings with Friends and Family: Never    Attends Religious Services: Never    Database administrator or Organizations: No    Attends Banker Meetings: Never    Marital Status: Divorced  Catering manager Violence: Not At Risk (05/27/2023)   Humiliation, Afraid, Rape, and Kick questionnaire    Fear of Current or Ex-Partner: No    Emotionally Abused: No    Physically Abused: No    Sexually Abused: No    Family History  Problem Relation Age of Onset   Alcohol abuse Mother    Diabetes Mother    Hypertension Mother    Kidney disease Mother    COPD Father    Diabetes Father    Early death Father    Heart disease Father    Hyperlipidemia Father    Hypertension Father    Diabetes Sister    Hypertension Sister    Breast cancer Sister    Diabetes Sister    Hypertension Sister    Kidney disease Sister    Cancer Maternal Grandmother    Alcohol abuse Maternal Grandfather    Cancer Maternal Grandfather    Cancer Paternal Grandmother    Cancer Paternal Grandfather     ROS: no fevers or chills, productive cough, hemoptysis, dysphasia, odynophagia, melena, hematochezia, dysuria, hematuria, rash, seizure activity, orthopnea, PND, pedal edema, claudication. Remaining systems are negative.  Physical  Exam: Well-developed well-nourished in no acute distress.  Skin is warm and dry.  HEENT is normal.  Neck is supple.  Chest is clear to auscultation with normal expansion.  Cardiovascular exam is regular rate and rhythm.  Abdominal exam nontender or distended. No masses palpated. Extremities show no edema. neuro grossly intact  EKG Interpretation Date/Time:  Tuesday June 25 2023 11:02:25 EDT Ventricular Rate:  75 PR Interval:  182 QRS Duration:  72 QT Interval:  394 QTC Calculation: 439 R Axis:   7  Text Interpretation: Normal sinus rhythm Low voltage QRS Cannot rule out Anterior infarct , age undetermined When compared with ECG of 12-Feb-2023 16:17, Questionable change in QRS axis Confirmed by Olga Millers ([id number]) on 06/25/2023 11:03:11 AM   A/P  1 chest pain-patient has had problems with chronic chest pain.  Recent CTA showed no coronary disease.  Will not pursue further ischemia evaluation.  2 history of mitral valve prolapse-not evident on most recent echocardiogram.  3 palpitations-recent monitor showed no significant arrhythmias.  Continue Toprol.  4 hypertension-blood pressure controlled.  Continue present medical regimen.  5 hyperlipidemia-continue Zetia.  Recent LDL 129.  Add Crestor 20 mg daily.  Check lipids and liver in 8 weeks.  Olga Millers, MD

## 2023-06-13 ENCOUNTER — Ambulatory Visit: Payer: Self-pay

## 2023-06-13 NOTE — Patient Instructions (Signed)
Visit Information  Thank you for taking time to visit with me today. Please don't hesitate to contact me if I can be of assistance to you.   Following are the goals we discussed today:  - Contact [facility] to request more information on their Independent Living program - Review resources provided via e-mail   Our next appointment is by telephone on 8/9 at 9:00   Please call the care guide team at [phone] if you need to cancel or reschedule your appointment.   If you are experiencing a Mental Health or Behavioral Health Crisis or need someone to talk to, please call the Suicide and Crisis Lifeline: 988 go to [facility] [address], [place] [phone]) call 911  Patient verbalizes understanding of instructions and care plan provided today and agrees to view in MyChart. Active MyChart status and patient understanding of how to access instructions and care plan via MyChart confirmed with patient.     Bevelyn Ngo, BSW, CDP Social Worker, Certified Dementia Practitioner [facility] Care Management  Care Coordination [phone]

## 2023-06-13 NOTE — Patient Outreach (Signed)
  Care Coordination   Follow Up Visit Note   06/13/2023 Name: [name] MRN: [id number] DOB: [birth year]  Cassandra Reid is a 67 y.o. year old female who sees Glendale Chard, DO for primary care. I spoke with  Cassandra Reid by phone today.  What matters to the patients health and wellness today?  Housing Resources    Goals Addressed             This Visit's Progress    Care Coordination Activities       Care Coordination Interventions: Collaboration with LCSW who requests assistance with housing resources as well as financial hardship related to medication costs Determined the patient has spoken with pharmacist Durward Mallard who is assisting with patient assistance application - the patient is working on getting proof of income to complete the application Discussed the patients lease on her current apartment will run out in January. She would like to move to a different neighborhood as she does not feel safe where she currently lives. Patient also reports she has a gas stove which makes her nervous as she has difficulty telling if it is lit or not Reviewed the patient has been looking for apartments and has considered breaking her lease early but is unsure if she wants to pay the fee to do this Education on Section 8 - patient confirms she is on this wait list already Education on Independent Living communities - patient is interested in pursuing independent living options Provided patient with information on [place] - patient reports she will call today to determine if there are any available rooms Discussed the patient does not mind if she stays in [place] - provided with information on [facility] in [place] as well as [facility] in [place] for review Assessed for transportation barriers - patient reports she has not driven in 20 years. She has SCAT but prefers to pay out of pocket for an Benedetto Goad - no transportation barriers at  this time        SDOH assessments and interventions completed:  No     Care Coordination Interventions:  Yes, provided   Interventions Today    Flowsheet Row Most Recent Value  Chronic Disease   Chronic disease during today's visit Diabetes, Hypertension (HTN), Other  [Social Isolation,  interest in alternative housing]  General Interventions   General Interventions Discussed/Reviewed General Interventions Discussed, Community Resources, Level of Care  Level of Care --  [Independent Living]  Education Interventions   Education Provided Provided Education        Follow up plan: Follow up call scheduled for 8/9    Encounter Outcome:  Pt. Visit Completed   Cassandra Reid, BSW, CDP Social Worker, Certified Dementia Practitioner [facility] Care Management  Care Coordination [phone]

## 2023-06-14 ENCOUNTER — Ambulatory Visit: Payer: Self-pay | Admitting: *Deleted

## 2023-06-14 NOTE — Patient Instructions (Signed)
Visit Information  Thank you for taking time to visit with me today. Please don't hesitate to contact me if I can be of assistance to you.   Following are the goals we discussed today:   Goals Addressed             This Visit's Progress    Reduce depression symptoms and enhance quality of life         Mental Health:  (Status: New goal.) Discuss depression and other symptoms with PCP at visit next Monday Evaluation of current treatment plan related to Depression: depressed mood Participation in counseling encouraged :transportation and copay concerns- Call [employer] to inquire about services, resources and support for mental health and other needs ,  Social Determinants of Health in Patient with :  (Status: New goal.) Follow up on resources provided (below as well as through Lurena Nida, and [employer]- community support  SDOH assessments completed: Housing , English as a second language teacher, Corporate treasurer , Geophysicist/field seismologist , Depression  , and Social Connections [employer] to apply for Section 8 housing (904)141-5048 www.nchousingsearch.org Greater Dietitian (select "find help") - Find Food  [place], [place] (ghpfa.org) or download the free app to your smart phone Food and Nutrition Services - [phone] or apply online at [facility] - ePASS Senior Line at [employer]) - provides transportation within city limits of [place] - contact [phone] to initiate application process [employer] (must be over age 57) - Engineer, maintenance at [phone] [employer] and [employer] - provides transportation for residents who reside in [place] outside the [place] area - Contact at [phone] [employer] at [phone] [employer] - to purchase insurance from the Marketplace or enroll in [place] benefits  [phone] [facility] Referral Service Dial [phone] or [phone] to speak with a call specialist.           Our next appointment is by telephone on 06/20/23  Please call the care guide team at [phone] if you need to cancel or reschedule your appointment.   If you are experiencing a Mental Health or Behavioral Health Crisis or need someone to talk to, please call the Suicide and Crisis Lifeline: 988 call 911   The patient verbalized understanding of instructions, educational materials, and care plan provided today and DECLINED offer to receive copy of patient instructions, educational materials, and care plan.   Telephone follow up appointment with care management team member scheduled for:06/20/23  Reece Levy, MSW, LCSW Clinical Social Worker [facility] [phone]

## 2023-06-14 NOTE — Patient Outreach (Signed)
Care Coordination   Follow Up Visit Note   06/14/2023 Name: Cassandra Reid MRN: [id number] DOB: 03/03/1956  Cassandra Reid is a 67 y.o. year old female who sees Glendale Chard, DO for primary care. I spoke with  [name] by phone today.  What matters to the patients health and wellness today?  Pt reports having 2 falls this week- one while in shower. She reports being sore and cut her foot- also reports being in bed mostly because of this- CSW assisting with getting an in-person visit at PCP office for Monday.    Goals Addressed             This Visit's Progress    Reduce depression symptoms and enhance quality of life         Mental Health:  (Status: New goal.) Discuss depression and other symptoms with PCP at visit next Monday Evaluation of current treatment plan related to Depression: depressed mood Participation in counseling encouraged :transportation and copay concerns- Call [employer] to inquire about services, resources and support for mental health and other needs ,  Social Determinants of Health in Patient with :  (Status: New goal.) Follow up on resources provided (below as well as through Lurena Nida, and [employer]- community support  SDOH assessments completed: Housing , English as a second language teacher, Corporate treasurer , Geophysicist/field seismologist , Depression  , and Social Connections [employer] to apply for Section 8 housing (531)087-7275 www.nchousingsearch.org Greater Dietitian (select "find help") - Find Food  [place], [place] (ghpfa.org) or download the free app to your smart phone Food and Nutrition Services - [phone] or apply online at [facility] - ePASS Senior Line at [employer]) - provides transportation within city limits of [place] - contact [phone] to initiate application process [employer] (must be over age 64) - Engineer, maintenance at [phone] [employer] and [employer] - provides transportation for residents who reside in [place] outside the [place] area - Contact at [phone] [employer] - [employer] at [phone] [employer] - to purchase insurance from the Marketplace or enroll in [place] benefits [phone] [facility] Referral Service Dial [phone] or [phone] to speak with a call specialist.           SDOH assessments and interventions completed:  Yes     Care Coordination Interventions:  Yes, provided  Interventions Today    Flowsheet Row Most Recent Value  Chronic Disease   Chronic disease during today's visit Other  [depression]  General Interventions   General Interventions Discussed/Reviewed [facility], Doctor Visits  Doctor Visits Discussed/Reviewed Doctor Visits Discussed, Doctor Visits Reviewed  [Pt has a virtual visit on same day as in person visit with Pharmacy- CSW made calls to [facility] to attempt to get both visits in person on Monday AM. [facility] staff assisting with arrangements.]  Mental Health Interventions   Mental Health Discussed/Reviewed Coping Strategies, Mental Health Discussed, Depression  Refer to Social Work for resources regarding Other  Bevelyn Ngo, BSW, with team is assisting with resources for housing options- pt reports she has already called several that were provided and is getting on wait lists with hopes of some sort of low income senior setting housing options]  Safety Interventions   Safety Discussed/Reviewed Safety Discussed  [Pt reports 2 falls this week- 1 while in shower. She indicates she is" sore and cut up foot some". CSW voiced concern for safety-  suggested considering a life alert button-]       Follow up plan: Follow up call scheduled for 06/20/23    Encounter Outcome:  Pt. Visit Completed

## 2023-06-17 ENCOUNTER — Other Ambulatory Visit: Payer: Self-pay

## 2023-06-17 ENCOUNTER — Ambulatory Visit: Payer: Medicare PPO | Admitting: Student

## 2023-06-17 ENCOUNTER — Ambulatory Visit: Payer: Medicare PPO | Admitting: Pharmacist

## 2023-06-17 ENCOUNTER — Telehealth: Payer: Self-pay | Admitting: *Deleted

## 2023-06-17 VITALS — BP 142/62 | HR 76 | Ht 61.0 in | Wt 178.6 lb

## 2023-06-17 DIAGNOSIS — J984 Other disorders of lung: Secondary | ICD-10-CM | POA: Diagnosis not present

## 2023-06-17 DIAGNOSIS — E1142 Type 2 diabetes mellitus with diabetic polyneuropathy: Secondary | ICD-10-CM | POA: Diagnosis not present

## 2023-06-17 DIAGNOSIS — Z794 Long term (current) use of insulin: Secondary | ICD-10-CM | POA: Diagnosis not present

## 2023-06-17 DIAGNOSIS — M791 Myalgia, unspecified site: Secondary | ICD-10-CM

## 2023-06-17 DIAGNOSIS — M5030 Other cervical disc degeneration, unspecified cervical region: Secondary | ICD-10-CM | POA: Diagnosis not present

## 2023-06-17 DIAGNOSIS — M5090 Cervical disc disorder, unspecified, unspecified cervical region: Secondary | ICD-10-CM

## 2023-06-17 MED ORDER — TRELEGY ELLIPTA 100-62.5-25 MCG/ACT IN AEPB: 1.0000 | INHALATION_SPRAY | Freq: Every day | RESPIRATORY_TRACT | Status: DC

## 2023-06-17 MED ORDER — LIDOCAINE 5 % EX PTCH: 1.0000 | MEDICATED_PATCH | CUTANEOUS | 0 refills | Status: DC

## 2023-06-17 NOTE — Progress Notes (Signed)
Reviewed and agree with Dr Koval's plan.   

## 2023-06-17 NOTE — Assessment & Plan Note (Signed)
Currently adherent with albuterol 2 puffs BID. Unable to afford maintenance inhaler in the past. 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 Pt does not qualify for [place] [facility] due to being insured. Pt filled out GSK application and will bring paperwork to office this week with records of total drug spend (>$600).  Anticipate application submission later this week/early next week.  - Provided Trelegy (fluticasone/umeclidinium/vilanterol) sample - 14D supply. Instructed pt to inhale 1 puff once daily.  - Educated pt on proper inhaler technique and pt successfully demonstrated technique.  - Will follow-up with Siri Cole to pursue GSK manufacturer assistance for Trelegy after pt returns application to office.

## 2023-06-17 NOTE — Assessment & Plan Note (Addendum)
Question if she has worsening spinal stenosis at C6-C7, or worsening DDD at C6-C7. Given that she is now having radicular symptoms, may need repeat MRI. Continue Cymbalta and gabapentin.

## 2023-06-17 NOTE — Telephone Encounter (Signed)
   Telephone encounter was:  Successful.  06/17/2023 Name: Cassandra Reid MRN: [id number] DOB: [birth year]  Cassandra Reid is a 67 y.o. year old female who is a primary care patient of Glendale Chard, DO . The community resource team was consulted for assistance with Transportation Needs   Care guide performed the following interventions: Patient provided with information about care guide support team and interviewed to confirm resource needs.Patient transportation was cance;ed as she was unable to confirma called and let her know and switched her preference to text to make it simpler to confirm if needed in the future   Follow Up Plan:  No further follow up planned at this time. The patient has been provided with needed resources.  Yehuda Mao Greenauer -[facility] [place], Population Health [phone] [address] [place] , [place] [address] Email : [email or web address]

## 2023-06-17 NOTE — Progress Notes (Signed)
S:     Chief Complaint  Patient presents with   Medication Management   Diabetes   67 y.o. female who presents for diabetes evaluation, education, and management. Patient arrives in  good spirits and presents without any assistance. She presents to pharmacy visit following appt with Dr. Melissa Noon for upper extremity weakness and suspected cervical disc disease.   Patient was referred and last seen by Primary Care Provider, Dr. Larita Fife, on 05/09/2023.  Last seen by Pharmacy on 05/20/2023.  PMH is significant for diabetes, and lung disease.  At last appt pt reported worsened diabetes control despite medication adherence. Pt did have a steroid injection on 05/27/23. Over the past week, she reports that BG are much improved and returned to baseline (FBG <130 mg/dL) on her current medication regimen. Her diet and activity level has remained consistent.   At previous appt, pt expressed that her medications were expensive. She has not been on a maintenance inhaler for restrictive/obstructive lung disease due to cost. She does not qualify for [place] [facility] due to being insured. She reports ongoing chest tightness and SOB, which she treats with albuterol 2 puffs BID.  Pt denies previous intolerance to metformin. She would consider re-trialing this medication in the future if recommended.   Current T2DM medications:  Tresiba (insulin degludec) 50 units daily,  Fiasp (insulin aspart) 16-14-12 units before meals +2 units if > 180 mg/dl blood glucose.    Respiratory medications:  Continues Albuterol 2 puffs BID  Patient reports adherence to taking all medications as prescribed.    Do you feel that your medications are working for you? no Have you been experiencing any side effects to the medications prescribed? no Do you have any problems obtaining medications due to transportation or finances? yes Insurance coverage: [employer]  Patient denies hypoglycemic events/symptoms. She denies seeing BG <100  mg/dL frequently. Reports lowest BG of 78 mg/dL.   Patient-reported exercise habits: limited periodically by neck/back pain  O:   Review of Systems  Respiratory:  Positive for shortness of breath.   Cardiovascular:  Negative for leg swelling.  Musculoskeletal:  Positive for back pain (and Neck) and neck pain.  Neurological:  Positive for weakness.  All other systems reviewed and are negative.   Physical Exam Constitutional:      Appearance: Normal appearance.  Pulmonary:     Effort: Pulmonary effort is normal.  Neurological:     Mental Status: She is alert.  Psychiatric:        Mood and Affect: Mood normal.        Behavior: Behavior normal.        Thought Content: Thought content normal.        Judgment: Judgment normal.    Dexcom G7 CGM Download: today  % Time CGM is active: 99.8% Average Glucose: 175 mg/dL Glucose Management Indicator: 7.5%   Glucose Variability: 24.8 (goal <36%) Time in Goal:  - Time in range 70-180: 60%  (79% in last 7 days) - Time above range: 34% (6% very  high > 250) - Time below range: 0% Observed patterns: overall elevated above goal, but improved- especially in past 7D.   Lab Results  Component Value Date   HGBA1C 6.5 03/18/2023   Vitals deferred as they were done today prior to ATC work-in visit with physician.   Lipid Panel     Component Value Date/Time   CHOL 184 06/26/2022 0824   TRIG 266 (H) 06/26/2022 0824   HDL 48 06/26/2022  0824   CHOLHDL 3.8 06/26/2022 0824   CHOLHDL 2.9 07/31/2019 1339   VLDL 38.2 07/18/2018 0940   LDLCALC 92 06/26/2022 0824   LDLCALC 48 07/31/2019 1339   LDLDIRECT 129 (H) 03/18/2023 1715    Clinical Atherosclerotic Cardiovascular Disease (ASCVD): No  The 10-year ASCVD risk score (Arnett DK, et al., 2019) is: 17.1%   Values used to calculate the score:     Age: 39 years     Sex: Female     Is Non-Hispanic African American: No     Diabetic: Yes     Tobacco smoker: No     Systolic Blood Pressure:  142 mmHg     Is BP treated: Yes     HDL Cholesterol: 48 mg/dL     Total Cholesterol: 184 mg/dL    A/P: Diabetes longstanding with recent worsening control despite medication adherence. Control has improved over the past two weeks, with TIR improving from 27% to 60% since 05/20/23 without hypoglycemia. Suspect recent elevation in glucose possibly related to stress with chronic pain, exacerbated by recent steroid injection. Medication adherence appears good. Given recent trend towards improved BG, especially over the past 7D, will defer further insulin titration to reduce risk of hypoglycemia. Of note, pt does not report previous intolerance to metformin. Could consider restarting metformin or combining with dapagflizon Davonna Belling) at future visit if further BG control is warranted. - Continued current dose of basal insulin Tresiba (insulin degludec) 50 units daily at 4PM - Continued current dose of rapid insulin Fiasp (insulin aspart) of 14u with breakfast, 16u with lunch, and 12u at dinner. Continue 2 unit sliding scale additional dose if baseline BG >180 mg/dL.    -Continued GLP-1 Ozempic (semaglutide) at 2mg  weekly.   -Continued SGLT2-I Farxiga (dapagliflozin) 10mg  daily.   -Patient educated on purpose, proper use, and potential adverse effects including management of low readings after effect of stress/pain are controlled at a higher level. .  -Extensively discussed pathophysiology of diabetes, recommended lifestyle interventions, dietary effects on blood sugar control.  -Counseled on s/sx of and management of hypoglycemia.  -Next A1c due at next PCP appt. Scheduled with Dr. Hyacinth Meeker on 07/02/23.  Respiratory: Currently adherent with albuterol 2 puffs BID. Unable to afford maintenance inhaler in the past. 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 Pt does not qualify for [place] [facility]  Assist due to being insured. Pt filled out GSK application and will bring paperwork to office this week with records of total drug spend (>$600).  Anticipate application submission later this week/early next week.  - Provided Trelegy (fluticasone/umeclidinium/vilanterol) sample - 14D supply. Instructed pt to inhale 1 puff once daily.  - Educated pt on proper inhaler technique and pt successfully demonstrated technique.  - Will follow-up with Siri Cole to pursue GSK manufacturer assistance for Trelegy after pt returns application to office.  Medication Samples have been provided to the patient.  Drug name: Trelegy       Strength: 100 mcg fluticasone        Qty: 1 device  LOT: [id number]  Exp.Date: 10/18/2024  Dosing instructions: Inhale 1 puff once daily  The patient has been instructed regarding the correct time, dose, and frequency of taking this medication, including desired effects and most common side effects.   Madelon Lips 11:44 AM 06/17/2023    Written patient instructions provided. Patient verbalized understanding of treatment plan.  Total time in  face to face counseling 25 minutes.    Follow-up:  PCP 2 weeks  Patient seen with Nils Pyle, PharmD PGY1 Pharmacy Resident

## 2023-06-17 NOTE — Assessment & Plan Note (Signed)
Diabetes longstanding with recent worsening control despite medication adherence. Control has improved over the past two weeks, with TIR improving from 27% to 60% since 05/20/23 without hypoglycemia. Suspect recent elevation in glucose possibly related to stress with chronic pain, exacerbated by recent steroid injection. Medication adherence appears good. Given recent trend towards improved BG, especially over the past 7D, will defer further insulin titration to reduce risk of hypoglycemia. Of note, pt does not report previous intolerance to metformin. Could consider restarting metformin or combining with dapagflizon Davonna Belling) at future visit if further BG control is warranted. - Continued current dose of basal insulin Tresiba (insulin degludec) 50 units daily at 4PM - Continued current dose of rapid insulin Fiasp (insulin aspart) of 14u with breakfast, 16u with lunch, and 12u at dinner. Continue 2 unit sliding scale additional dose if baseline BG >180 mg/dL.    -Continued GLP-1 Ozempic (semaglutide) at 2mg  weekly.   -Continued SGLT2-I Farxiga (dapagliflozin) 10mg  daily.   -Patient educated on purpose, proper use, and potential adverse effects including management of low readings after effect of stress/pain are controlled at a higher level. .  -Extensively discussed pathophysiology of diabetes, recommended lifestyle interventions, dietary effects on blood sugar control.

## 2023-06-17 NOTE — Progress Notes (Signed)
    SUBJECTIVE:   CHIEF COMPLAINT / HPI:   Cassandra Reid is here to discuss neck and shoulder pain.  She reports pain mostly at the base of her neck over her trapezius muscle from proximal to distal insertions.  She says she has been feeling some weakness in her arms and also "just all over."  She has some numbness and tingling in her arms and hands and says that when she does the dishes, she tires out and feels like she cannot finish doing them. Sometimes she has to wake up at night and shake her hands out because they feel like it fell asleep.  She follows with pain management for occipital neuralgia and recently had an injection, but has not had much relief from it.  She uses a heating pad.  Also takes Tylenol as needed.  Known history of disc bulging (most severe at the left C6 nerve root) and moderate cervical degenerative disc disease.  Last cervical spine MRI in 2021.  PERTINENT  PMH / PSH: Fibromyalgia, Cervical degenerative disc disease, CKD, myofascial pain dysfunction syndrome  OBJECTIVE:   BP (!) 142/62   Pulse 76   Ht 5\' 1"  (1.549 m)   Wt 178 lb 9.6 oz (81 kg)   SpO2 97%   BMI 33.75 kg/m   General: Well-appearing and in no distress, ambulates independently without assistance MSK, neck: Significant muscle tenderness and tautness in the trapezius muscle, worse on the left with trigger point.  Equal strength bilaterally in upper extremities, 5/5.  Neurovascularly intact.   ASSESSMENT/PLAN:   Myalgia Pain, stiffness and tightness of the upper trapezius muscle with persistent neck pain.  Reassuringly on exam, she has normal strength bilateral in upper extremities, 5/5.  No red flag signs or symptoms. Discussed massage, heat, lidocaine patches and home stretches. Likely would benefit from physical therapy. However, review of MRI from 2021 does show moderate degenerative disc disease as well as disc bulging, worse at C6 on imaging at that time.  It is likely that she may  need another MRI to evaluate to see if she has any worsening of her disc bulging contributing to her symptoms. Placed referral for sports medicine for evaluation of this problem. Discussed return precautions.  DDD (degenerative disc disease), cervical Question if she has worsening spinal stenosis at C6-C7, or worsening DDD at C6-C7. Given that she is now having radicular symptoms, may need repeat MRI. Continue Cymbalta and gabapentin.    Darral Dash, DO [facility]

## 2023-06-17 NOTE — Assessment & Plan Note (Signed)
>>  ASSESSMENT AND PLAN FOR MYALGIA WRITTEN ON 06/17/2023 10:15 AM BY DAMERON, MARISA, DO  Pain, stiffness and tightness of the upper trapezius muscle with persistent neck pain.  Reassuringly on exam, she has normal strength bilateral in upper extremities, 5/5.  No red flag signs or symptoms. Discussed massage, heat, lidocaine patches and home stretches. Likely would benefit from physical therapy. However, review of MRI from 2021 does show moderate degenerative disc disease as well as disc bulging, worse at C6 on imaging at that time.  It is likely that she may need another MRI to evaluate to see if she has any worsening of her disc bulging contributing to her symptoms. Placed referral for sports medicine for evaluation of this problem. Discussed return precautions.

## 2023-06-17 NOTE — Patient Instructions (Signed)
It was great to see you today!  Start taking Trelegy (1 puff once a day).   We provided you a sample with a 14 day supply.  To take the dose, pull the cap down. Hold the inhaler up to your mouth like a hamburger, cover the mouthpiece completely with your mouth, and breath in hard and fast. Hold you breath as long as you can before breathing out to help the medicine reach your lungs  Please continue taking your diabetes medications as discussed. I am happy to see your blood sugars have come down recently.   Drop off the medication assistance forms when you have them so we can help you apply for manufacturer assistance for a maintenance inhaler.

## 2023-06-17 NOTE — Patient Instructions (Addendum)
It was great seeing you today.  As we discussed, - I placed a referral to Sports Medicine for your neck pain and disc disease. You may need another cervical MRI to see if your disc bulging is worsening or contributing to your symptoms. - I sent Lidocaine patches to your pharmacy. Use these every 24 hours as needed. Use the heating pad as needed.Massage therapy can help with your muscle tension.   If you have any questions or concerns, please feel free to call the clinic.   Have a wonderful day,  Dr. Darral Dash [facility] [phone]

## 2023-06-17 NOTE — Assessment & Plan Note (Signed)
Pain, stiffness and tightness of the upper trapezius muscle with persistent neck pain.  Reassuringly on exam, she has normal strength bilateral in upper extremities, 5/5.  No red flag signs or symptoms. Discussed massage, heat, lidocaine patches and home stretches. Likely would benefit from physical therapy. However, review of MRI from 2021 does show moderate degenerative disc disease as well as disc bulging, worse at C6 on imaging at that time.  It is likely that she may need another MRI to evaluate to see if she has any worsening of her disc bulging contributing to her symptoms. Placed referral for sports medicine for evaluation of this problem. Discussed return precautions.

## 2023-06-20 ENCOUNTER — Encounter: Payer: Self-pay | Admitting: *Deleted

## 2023-06-20 ENCOUNTER — Telehealth: Payer: Self-pay | Admitting: *Deleted

## 2023-06-20 DIAGNOSIS — E1142 Type 2 diabetes mellitus with diabetic polyneuropathy: Secondary | ICD-10-CM | POA: Diagnosis not present

## 2023-06-20 NOTE — Patient Outreach (Signed)
  Care Coordination   06/20/2023 Name: Cassandra Reid MRN: [id number] DOB: Apr 21, 1956   Care Coordination Outreach Attempts:  A second unsuccessful outreach was attempted today to offer the patient with information about available care coordination services.  Follow Up Plan:  Additional outreach attempts will be made to offer the patient care coordination information and services.   Encounter Outcome:  No Answer   Care Coordination Interventions:  No, not indicated    Reece Levy, MSW, LCSW Clinical Social Worker [facility] [phone]

## 2023-06-24 ENCOUNTER — Telehealth: Payer: Self-pay | Admitting: *Deleted

## 2023-06-24 NOTE — Telephone Encounter (Signed)
Telephone encounter was:  Successful.  06/24/2023 Name: Cassandra Reid MRN: [id number] DOB: 02-02-1956  Cassandra Reid is a 67 y.o. year old female who is a primary care patient of Glendale Chard, DO . The community resource team was consulted for assistance with Transportation Needs patient called concierge line to put in 2 ride requests 8/6 and 8/8 not sure if they will be picked up as they are shortnotice rides , will notify patient   Care guide performed the following interventions: Patient provided with information about care guide support team and interviewed to confirm resource needs.  Follow Up Plan:  No further follow up planned at this time. The patient has been provided with needed resources. Yehuda Mao Greenauer -[facility], Population Health [phone] [address] [place] , [place] [address] Email : [email or web address]     Cassandra Reid DOB: 03-03-1956 MRN: [id number]   RIDER WAIVER AND RELEASE OF LIABILITY  For purposes of improving physical access to our facilities, [facility] is pleased to partner with third parties to provide [facility] patients or other authorized individuals the option of convenient, on-demand ground transportation services (the Chiropractor") through use of the technology service that enables users to request on-demand ground transportation from independent third-party providers.  By opting to use and accept these [employer], I, the undersigned, hereby agree on behalf of myself, and on behalf of any minor child using the Science writer for whom I am the parent or legal guardian, as follows:  Science writer provided to me are provided by independent third-party transportation providers who are not [employer] or employees and who are unaffiliated with [employer]. [place] is neither a transportation carrier nor a common or public carrier. [facility] has no control over the quality or  safety of the transportation that occurs as a result of the [employer]. [place] cannot guarantee that any third-party transportation provider will complete any arranged transportation service. [place] makes no representation, warranty, or guarantee regarding the reliability, timeliness, quality, safety, suitability, or availability of any of the Transport Services or that they will be error free. I fully understand that traveling by vehicle involves risks and dangers of serious bodily injury, including permanent disability, paralysis, and death. I agree, on behalf of myself and on behalf of any minor child using the Transport Services for whom I am the parent or legal guardian, that the entire risk arising out of my use of the [employer] remains solely with me, to the maximum extent permitted under applicable law. The [employer] are provided "as is" and "as available." [place] disclaims all representations and warranties, express, implied or statutory, not expressly set out in these terms, including the implied warranties of merchantability and fitness for a particular purpose. I hereby waive and release [place], its agents, employees, officers, directors, representatives, insurers, attorneys, assigns, successors, subsidiaries, and affiliates from any and all past, present, or future claims, demands, liabilities, actions, causes of action, or suits of any kind directly or indirectly arising from acceptance and use of the [employer]. I further waive and release  and its affiliates from all present and future liability and responsibility for any injury or death to persons or damages to property caused by or related to the use of the [employer]. I have read this Waiver and Release of Liability, and I understand the terms used in it and their legal significance. This Waiver is freely  and voluntarily given with the understanding that my right (as  well as the right of any minor child for whom I am the parent or legal guardian using the [employer]) to legal recourse against [place] in connection with the [employer] is knowingly surrendered in return for use of these services.   I attest that I read the consent document to Cassandra Reid, gave Ms. Ba the opportunity to ask questions and answered the questions asked (if any). I affirm that Cassandra Reid then provided consent for she's participation in this program.     [name]

## 2023-06-25 ENCOUNTER — Encounter: Payer: Self-pay | Admitting: Cardiology

## 2023-06-25 ENCOUNTER — Ambulatory Visit: Payer: Medicare PPO | Admitting: Cardiology

## 2023-06-25 VITALS — BP 118/72 | HR 76 | Ht 61.0 in | Wt 178.8 lb

## 2023-06-25 DIAGNOSIS — R002 Palpitations: Secondary | ICD-10-CM | POA: Diagnosis not present

## 2023-06-25 DIAGNOSIS — E785 Hyperlipidemia, unspecified: Secondary | ICD-10-CM | POA: Diagnosis not present

## 2023-06-25 DIAGNOSIS — Q245 Malformation of coronary vessels: Secondary | ICD-10-CM

## 2023-06-25 DIAGNOSIS — I10 Essential (primary) hypertension: Secondary | ICD-10-CM | POA: Diagnosis not present

## 2023-06-25 DIAGNOSIS — R072 Precordial pain: Secondary | ICD-10-CM | POA: Diagnosis not present

## 2023-06-25 DIAGNOSIS — E782 Mixed hyperlipidemia: Secondary | ICD-10-CM | POA: Diagnosis not present

## 2023-06-25 MED ORDER — ROSUVASTATIN CALCIUM 20 MG PO TABS
20.0000 mg | ORAL_TABLET | Freq: Every day | ORAL | 3 refills | Status: DC
Start: 2023-06-25 — End: 2023-07-02

## 2023-06-25 MED ORDER — FLUTICASONE FUROATE-VILANTEROL 200-25 MCG/ACT IN AEPB
1.0000 | INHALATION_SPRAY | Freq: Every day | RESPIRATORY_TRACT | 3 refills | Status: DC
Start: 2023-06-25 — End: 2024-02-05

## 2023-06-25 NOTE — Telephone Encounter (Signed)
Patient returned [employer] patient assistance application for Breo or Arnuity.  Application requires a Engineer, petroleum.  Could a 90 day supply RX (w/ refills) be placed in my box to submit with application?

## 2023-06-25 NOTE — Patient Instructions (Signed)
Medication Instructions:   START ROSUVASTATIN 20 MG ONCE DAILY  *If you need a refill on your cardiac medications before your next appointment, please call your pharmacy*   Lab Work:  Your physician recommends that you return for lab work in: [facility]  If you have labs (blood work) drawn today and your tests are completely normal, you will receive your results only by: MyChart Message (if you have MyChart) OR A paper copy in the mail If you have any lab test that is abnormal or we need to change your treatment, we will call you to review the results.   Follow-Up: At [facility], you and your health needs are our priority.  As part of our continuing mission to provide you with exceptional heart care, we have created designated Provider Care Teams.  These Care Teams include your primary Cardiologist (physician) and Advanced Practice Providers (APPs -  Physician Assistants and Nurse Practitioners) who all work together to provide you with the care you need, when you need it.  We recommend signing up for the patient portal called "MyChart".  Sign up information is provided on this After Visit Summary.  MyChart is used to connect with patients for Virtual Visits (Telemedicine).  Patients are able to view lab/test results, encounter notes, upcoming appointments, etc.  Non-urgent messages can be sent to your provider as well.   To learn more about what you can do with MyChart, go to [email or web address].    Your next appointment:   12 month(s)  Provider:   Olga Millers, MD

## 2023-06-25 NOTE — Telephone Encounter (Signed)
Paper script signed for Breo and placed in Camille's box.   Glendale Chard, DO [facility], PGY-2 06/25/23 11:17 AM

## 2023-06-26 ENCOUNTER — Telehealth: Payer: Self-pay | Admitting: *Deleted

## 2023-06-26 ENCOUNTER — Encounter: Payer: Self-pay | Admitting: Cardiology

## 2023-06-26 NOTE — Telephone Encounter (Signed)
Submitted application for BREO ELLIPTA 100MCG/25MCG to [place] for patient assistance.   Phone: [phone]

## 2023-06-26 NOTE — Telephone Encounter (Signed)
Telephone encounter was:  Successful.  06/26/2023 Name: Cassandra Reid MRN: [id number] DOB: 1956-03-26  Cassandra Reid is a 67 y.o. year old female who is a primary care patient of Glendale Chard, DO . The community resource team was consulted for assistance with Transportation Needs   contacted concierge line for  transportation 07/02/2023  Care guide performed the following interventions: Patient provided with information about care guide support team and interviewed to confirm resource needs.  Follow Up Plan:  No further follow up planned at this time. The patient has been provided with needed resources.  Yehuda Mao Greenauer -[facility] [place], Population Health [phone] [address] [place] , [place] [address] Email : [email or web address]     Cassandra Reid DOB: 1956/02/13 MRN: [id number]   Cassandra Reid WAIVER AND RELEASE OF LIABILITY  For purposes of improving physical access to our facilities, [facility] is pleased to partner with third parties to provide [facility] patients or other authorized individuals the option of convenient, on-demand ground transportation services (the Chiropractor") through use of the technology service that enables users to request on-demand ground transportation from independent third-party providers.  By opting to use and accept these [employer], I, the undersigned, hereby agree on behalf of myself, and on behalf of any minor child using the Science writer for whom I am the parent or legal guardian, as follows:  Science writer provided to me are provided by independent third-party transportation providers who are not [employer] or employees and who are unaffiliated with [employer]. [place] is neither a transportation carrier nor a common or public carrier. [place] has no control over the quality or safety of the transportation that occurs as a result of the [employer]. [place]  cannot guarantee that any third-party transportation provider will complete any arranged transportation service. [facility] makes no representation, warranty, or guarantee regarding the reliability, timeliness, quality, safety, suitability, or availability of any of the Transport Services or that they will be error free. I fully understand that traveling by vehicle involves risks and dangers of serious bodily injury, including permanent disability, paralysis, and death. I agree, on behalf of myself and on behalf of any minor child using the Transport Services for whom I am the parent or legal guardian, that the entire risk arising out of my use of the [employer] remains solely with me, to the maximum extent permitted under applicable law. The [employer] are provided "as is" and "as available." [place] disclaims all representations and warranties, express, implied or statutory, not expressly set out in these terms, including the implied warranties of merchantability and fitness for a particular purpose. I hereby waive and release [place], its agents, employees, officers, directors, representatives, insurers, attorneys, assigns, successors, subsidiaries, and affiliates from any and all past, present, or future claims, demands, liabilities, actions, causes of action, or suits of any kind directly or indirectly arising from acceptance and use of the [employer]. I further waive and release  and its affiliates from all present and future liability and responsibility for any injury or death to persons or damages to property caused by or related to the use of the [employer]. I have read this Waiver and Release of Liability, and I understand the terms used in it and their legal significance. This Waiver is freely and voluntarily given with the understanding that my right (as well as the right of any minor child for whom I  am the parent or legal guardian using the  [employer]) to legal recourse against [place] in connection with the [employer] is knowingly surrendered in return for use of these services.   I attest that I read the consent document to [name], gave Ms. Kristof the opportunity to ask questions and answered the questions asked (if any). I affirm that [name] then provided consent for she's participation in this program.     [name]

## 2023-06-27 ENCOUNTER — Telehealth: Payer: Self-pay | Admitting: Nurse Practitioner

## 2023-06-27 ENCOUNTER — Ambulatory Visit: Payer: Medicare PPO | Admitting: Nurse Practitioner

## 2023-06-27 NOTE — Telephone Encounter (Signed)
DD, please inform patient that I do not recommend virtual appointments for new patients. Please reschedule patient for an office visit. I recommend for her to contact her PCP if her symptoms worsen prior to her appointment. THX.

## 2023-06-27 NOTE — Telephone Encounter (Signed)
Inbound call from patient stating the bridge that she has to cross to be able to appointment today at 2:00 pm will soon be flooded and she will not be able to make it. Patient is requesting to know if it is possible to change to a virtual visit. Please advise, thank you.

## 2023-06-27 NOTE — Progress Notes (Deleted)
06/27/2023 Cassandra Reid [id number] 1956-02-11   CHIEF COMPLAINT: Severe belching, loose stools   HISTORY OF PRESENT ILLNESS: Cassandra Reid is a 67 year old female with a past medical history of arthritis, fibromyalgia, asthma, hyperlipidemia, hypertension, diabetes mellitus type 2, CKD stage III, sleep apnea, hepatic steatosis, GERD, C. difficile colitis 09/2021 and colon polyps.  EGD 04/18/2021:   Colonoscopy 01/02/2021:   Colonoscopy 11/24/2020:  Past Medical History:  Diagnosis Date   Acute pain of left shoulder 04/03/2022   Allergic rhinitis 09/26/2018   Anosmia    Anosmia    Arthritis    Asthma    Balance problems 09/22/2021   C. difficile colitis 07/18/2018   Cerebral aneurysm without rupture    Chest pain 03/26/2020   Chronic pain syndrome 04/23/2019   CKD stage 3 due to type 2 diabetes mellitus 04/15/2019   DDD (degenerative disc disease), cervical 04/16/2019   Encephalomalacia on imaging study 08/25/2020   Brain MRI 08/25/21: Few scattered areas of encephalomalacia and gliosis within the overlying left frontal lobe most likely postoperative in nature.   Fatty liver 05/31/2020   Fibromyalgia    Former smoker    Generalized anxiety disorder    GERD (gastroesophageal reflux disease)    Heart murmur    History of chicken pox    History of colon polyps    Hyperlipidemia associated with type 2 diabetes mellitus 07/18/2018   Hypertension associated with diabetes 07/18/2018   IBS (irritable bowel syndrome)    Incontinence 09/22/2021   Bowel and bladder.    Ischemic brain injury 05/29/2021   Lacunar infarction 05/25/2021   Brain MRI 08/25/21: Few small remote lacunar infarcts noted at the right caudate and left internal capsule   Legally blind    Major depressive disorder    Migraines    Myalgia 04/23/2019   OSA (obstructive sleep apnea) 07/18/2018   not using CPAP machine (broken)   Osteoporosis 01/18/2021   Paroxysmal atrial tachycardia    On telemetry    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   Type 2 diabetes mellitus with diabetic polyneuropathy, with long-term current use of insulin 07/18/2018   has dexcom G7  - sensor left upper arm   Past Surgical History:  Procedure Laterality Date   ABDOMINAL HYSTERECTOMY  2012   APPENDECTOMY  1972   CEREBRAL ANEURYSM REPAIR Left 2004   Aneurysm clip in place near the level of the left ICA on Brain MRI 08/2020. Also coiling performed   CHOLECYSTECTOMY  2015   COLONOSCOPY  11/24/2020   in [place]   CRANIOTOMY Left    left pterional craniotomy   ETHMOIDECTOMY Left 11/23/2022   Procedure: LEFT ANTERIOR ETHMOIDECTOMY;  Surgeon: Laren Boom, DO;  Location: [facility] OR;  Service: ENT;  Laterality: Left;   EYE SURGERY  2023   MAXILLARY ANTROSTOMY Left 11/23/2022   Procedure: MAXILLARY ANTROSTOMY;  Surgeon: Laren Boom, DO;  Location: [facility] OR;  Service: ENT;  Laterality: Left;   MENISCUS REPAIR Bilateral    NASAL TURBINATE REDUCTION Bilateral 11/23/2022   Procedure: Bilateral turbinate reduction;  Surgeon: Laren Boom, DO;  Location: [facility] OR;  Service: ENT;  Laterality: Bilateral;   SINUS ENDO W/FUSION Left 11/23/2022   Procedure: Functional endoscopic sinus surgery with navigation.;  Surgeon: Laren Boom, DO;  Location: [facility] OR;  Service: ENT;  Laterality: Left;   TUBAL LIGATION  1990   UPPER  GI ENDOSCOPY  11/24/2020   in [place]   Social History:  Family History:    reports that she quit smoking about 56 years ago. Her smoking use included cigarettes. She started smoking about 59 years ago. She has a 3 pack-year smoking history. She has been exposed to tobacco smoke. She has never used smokeless tobacco. She reports that she does not currently use drugs after having used the following drugs: Cocaine and Marijuana. She reports that she does not drink alcohol. family history includes Alcohol abuse in her  maternal grandfather and mother; Breast cancer in her sister; COPD in her father; Cancer in her maternal grandfather, maternal grandmother, paternal grandfather, and paternal grandmother; Diabetes in her father, mother, sister, and sister; Early death in her father; Heart disease in her father; Hyperlipidemia in her father; Hypertension in her father, mother, sister, and sister; Kidney disease in her mother and sister. No Known Allergies    Outpatient Encounter Medications as of 06/27/2023  Medication Sig   albuterol (VENTOLIN HFA) 108 (90 Base) MCG/ACT inhaler INHALE 2 PUFFS INTO THE LUNGS 2 (TWO) TIMES DAILY. PLUS 2 PUFFS AS NEEDED   amLODipine (NORVASC) 5 MG tablet TAKE 1 TABLET EVERY DAY   Ascorbic Acid (VITAMIN C ADULT GUMMIES PO) Take 1 each by mouth daily in the afternoon.   BD PEN NEEDLE NANO 2ND GEN 32G X 4 MM MISC USE WITH INSULIN   blood glucose meter kit and supplies Dispense based on patient and insurance preference. Use up to four times daily as directed. (FOR ICD-10 E10.9, E11.9).   Continuous Blood Gluc Sensor (DEXCOM G6 SENSOR) MISC Use as directed to check blood sugar.   Continuous Blood Gluc Transmit (DEXCOM G6 TRANSMITTER) MISC Use as directed to check blood sugar.   dapagliflozin propanediol (FARXIGA) 10 MG TABS tablet Take 1 tablet (10 mg total) by mouth daily.   DULoxetine (CYMBALTA) 60 MG capsule Take 1 capsule (60 mg total) by mouth daily.   ezetimibe (ZETIA) 10 MG tablet TAKE 1 TABLET EVERY DAY   famotidine (PEPCID) 20 MG tablet Take 1 tablet (20 mg total) by mouth daily.   FIBER SELECT GUMMIES PO Take 1 tablet by mouth daily in the afternoon.   fluticasone (FLONASE) 50 MCG/ACT nasal spray Place 1 spray into both nostrils daily as needed for allergies or rhinitis.   fluticasone furoate-vilanterol (BREO ELLIPTA) 200-25 MCG/ACT AEPB Inhale 1 puff into the lungs daily.   furosemide (LASIX) 40 MG tablet Take 1 tablet (40 mg total) by mouth as needed for edema. (Patient taking  differently: Take 40 mg by mouth daily as needed (ankle swelling.).)   gabapentin (NEURONTIN) 600 MG tablet TAKE 0.5-1 TABLET (300-600 MG TOTAL) BY MOUTH 3 (THREE) TIMES DAILY. (Patient taking differently: Take 600 mg by mouth daily.)   insulin aspart (FIASP) 100 UNIT/ML FlexTouch Pen Inject 14-16 Units into the skin daily with breakfast AND 12-14 Units daily with lunch AND 10-12 Units daily with supper. This is a patient assistance medication. Patient may not be approved and/or have medication. Please ask and verify when performing med review..   insulin degludec (TRESIBA) 100 UNIT/ML FlexTouch Pen Inject 42 Units into the skin daily. This is a patient assistance medication. Patient may not be approved and/or have medication. Please ask and verify when performing med review. (Patient taking differently: Inject 44 Units into the skin daily. This is a patient assistance medication. Patient may not be approved and/or have medication. Please ask and verify when performing med review.)  lidocaine (LIDODERM) 5 % Place 1 patch onto the skin daily. Remove & Discard patch within 12 hours or as directed by MD   lisinopril (ZESTRIL) 40 MG tablet Take 1 tablet (40 mg total) by mouth daily. (Patient taking differently: Take 40 mg by mouth in the morning.)   loratadine (CLARITIN) 10 MG tablet TAKE 1 TABLET EVERY DAY   LORazepam (ATIVAN) 0.5 MG tablet TAKE 1 TABLET EVERY 8 HOURS AS NEEDED FOR ANXIETY   Magnesium 500 MG TABS Take 500 mg by mouth daily in the afternoon.   metoprolol succinate (TOPROL-XL) 50 MG 24 hr tablet Take 1 tablet (50 mg total) by mouth daily. Take with or immediately following a meal.   Multiple Vitamin (MULTIVITAMIN WITH MINERALS) TABS tablet Take 1 tablet by mouth in the morning.   OneTouch Delica Lancets 33G MISC Please use to check blood sugar up to 4 times daily. E11.42   Oyster Shell (OYSTER CALCIUM) 500 MG TABS tablet Take 500 mg of elemental calcium by mouth daily in the afternoon.    pantoprazole (PROTONIX) 40 MG tablet TAKE 1 TABLET EVERY DAY   potassium chloride SA (KLOR-CON M) 20 MEQ tablet TAKE 1 TABLET AS NEEDED (TAKE AS DIRECTED WITH LASIX AS NEEDED FOR EDEMA)   rosuvastatin (CRESTOR) 20 MG tablet Take 1 tablet (20 mg total) by mouth daily.   Semaglutide, 2 MG/DOSE, (OZEMPIC, 2 MG/DOSE,) 8 MG/3ML SOPN Inject 2 mg into the skin once a week.   sucralfate (CARAFATE) 1 g tablet TAKE 1 TABLET FOUR TIMES DAILY, WITH MEALS AND AT BEDTIME (OKAY TO TRIAL OFF AS INSTRUCTED) (Patient not taking: Reported on 06/25/2023)   traZODone (DESYREL) 50 MG tablet TAKE 1/2 TABLET BY MOUTH AT BEDTIME AS NEEDED FOR SLEEP.   TRUE METRIX BLOOD GLUCOSE TEST test strip TEST BLOOD SUGAR AS INSTRUCTED   No facility-administered encounter medications on file as of 06/27/2023.     REVIEW OF SYSTEMS:  Gen: Denies fever, sweats or chills. No weight loss.  CV: Denies chest pain, palpitations or edema. Resp: Denies cough, shortness of breath of hemoptysis.  GI: Denies heartburn, dysphagia, stomach or lower abdominal pain. No diarrhea or constipation.  GU: Denies urinary burning, blood in urine, increased urinary frequency or incontinence. MS: Denies joint pain, muscles aches or weakness. Derm: Denies rash, itchiness, skin lesions or unhealing ulcers. Psych: Denies depression, anxiety, memory loss or confusion. Heme: Denies bruising, easy bleeding. Neuro:  Denies headaches, dizziness or paresthesias. Endo:  Denies any problems with DM, thyroid or adrenal function.  PHYSICAL EXAM: There were no vitals taken for this visit. General: in no acute distress. Head: Normocephalic and atraumatic. Eyes:  Sclerae non-icteric, conjunctive pink. Ears: Normal auditory acuity. Mouth: Dentition intact. No ulcers or lesions.  Neck: Supple, no lymphadenopathy or thyromegaly.  Lungs: Clear bilaterally to auscultation without wheezes, crackles or rhonchi. Heart: Regular rate and rhythm. No murmur, rub or gallop  appreciated.  Abdomen: Soft, nontender, nondistended. No masses. No hepatosplenomegaly. Normoactive bowel sounds x 4 quadrants.  Rectal:  Musculoskeletal: Symmetrical with no gross deformities. Skin: Warm and dry. No rash or lesions on visible extremities. Extremities: No edema. Neurological: Alert oriented x 4, no focal deficits.  Psychological:  Alert and cooperative. Normal mood and affect.  ASSESSMENT AND PLAN:    CC:  Valetta Close, MD

## 2023-06-27 NOTE — Telephone Encounter (Signed)
Contacted pt & pt has been rescheduled for 10/24 at 11:30 am

## 2023-06-28 ENCOUNTER — Ambulatory Visit: Payer: Self-pay

## 2023-06-28 NOTE — Patient Outreach (Signed)
  Care Coordination   Follow Up Visit Note   06/28/2023 Name: Cassandra Reid MRN: [id number] DOB: Nov 19, 1956  Cassandra Reid is a 67 y.o. year old female who sees Glendale Chard, DO for primary care. I spoke with  [name] by phone today.  What matters to the patients health and wellness today?  Patient is preparing to move into a new apartment    Goals Addressed             This Visit's Progress    COMPLETED: Care Coordination Activities       Care Coordination Interventions: Discussed patient is very interested in moving to [facility] and has submitted an application for the wait list Determined the patient has located an apartment that is smaller and more within her budget. The complex is called [facility]" and is located in [place] [address]. Patient reports she gets the keys on August 19th and will have a moving company move her on August 23rd. She is in process of packing now Collaboration with LCSW to advise of patients planned move later this month        SDOH assessments and interventions completed:  No     Care Coordination Interventions:  Yes, provided   Interventions Today    Flowsheet Row Most Recent Value  Chronic Disease   Chronic disease during today's visit Other  [Housing]  General Interventions   General Interventions Discussed/Reviewed General Interventions Reviewed        Follow up plan: No further intervention required.   Encounter Outcome:  Pt. Visit Completed   Bevelyn Ngo, BSW, CDP Social Worker, Certified Dementia Practitioner [facility] Care Management  Care Coordination [phone]

## 2023-06-28 NOTE — Patient Instructions (Signed)
Visit Information  Thank you for taking time to visit with me today. Please don't hesitate to contact me if I can be of assistance to you.   Your next appointment is by telephone on 8/15 at 1:00 with LCSW Reece Levy  Please call the care guide team at [phone] if you need to cancel or reschedule your appointment.   If you are experiencing a Mental Health or Behavioral Health Crisis or need someone to talk to, please call 1-800-273-TALK (toll free, 24 hour hotline) go to [facility] [address], [place] [phone]) call 911  Patient verbalizes understanding of instructions and care plan provided today and agrees to view in MyChart. Active MyChart status and patient understanding of how to access instructions and care plan via MyChart confirmed with patient.     No further follow up required: Congratulations on your new apartment! Please contact me as needed.  Bevelyn Ngo, BSW, CDP Social Worker, Certified Dementia Practitioner [facility] Care Management  Care Coordination [phone]

## 2023-07-02 ENCOUNTER — Encounter: Payer: Self-pay | Admitting: Student

## 2023-07-02 ENCOUNTER — Ambulatory Visit (INDEPENDENT_AMBULATORY_CARE_PROVIDER_SITE_OTHER): Payer: Medicare PPO | Admitting: Student

## 2023-07-02 VITALS — BP 138/78 | HR 69 | Ht 61.0 in | Wt 179.2 lb

## 2023-07-02 DIAGNOSIS — D751 Secondary polycythemia: Secondary | ICD-10-CM | POA: Diagnosis not present

## 2023-07-02 DIAGNOSIS — G9389 Other specified disorders of brain: Secondary | ICD-10-CM | POA: Diagnosis not present

## 2023-07-02 DIAGNOSIS — M7918 Myalgia, other site: Secondary | ICD-10-CM | POA: Diagnosis not present

## 2023-07-02 DIAGNOSIS — J984 Other disorders of lung: Secondary | ICD-10-CM

## 2023-07-02 DIAGNOSIS — Z794 Long term (current) use of insulin: Secondary | ICD-10-CM

## 2023-07-02 DIAGNOSIS — M5030 Other cervical disc degeneration, unspecified cervical region: Secondary | ICD-10-CM | POA: Diagnosis not present

## 2023-07-02 DIAGNOSIS — E1142 Type 2 diabetes mellitus with diabetic polyneuropathy: Secondary | ICD-10-CM | POA: Diagnosis not present

## 2023-07-02 DIAGNOSIS — G4731 Primary central sleep apnea: Secondary | ICD-10-CM

## 2023-07-02 MED ORDER — INSULIN DEGLUDEC 100 UNIT/ML ~~LOC~~ SOPN: 50.0000 [IU] | PEN_INJECTOR | Freq: Every day | SUBCUTANEOUS | 3 refills | Status: DC

## 2023-07-02 MED ORDER — TRELEGY ELLIPTA 200-62.5-25 MCG/ACT IN AEPB
1.0000 | INHALATION_SPRAY | Freq: Every day | RESPIRATORY_TRACT | Status: DC
Start: 2023-07-02 — End: 2023-12-13

## 2023-07-02 NOTE — Progress Notes (Addendum)
    SUBJECTIVE:   CHIEF COMPLAINT / HPI:   Cassandra Reid is a 67 y.o. female  presenting for follow up[.   Cervical DDD: referred to sport medicine but has not heard back. Some days are worse than others - her bad days she feels weak on both arms and struggles to do tasks. She feels like her symptoms have progressed over the past 1-2 years to the point where she has to modify her daily activities. She reports keeping her son's dog in May 2024 and the dog would jerk her by the leash causing continuous pain and weakness in her arms that has not improved.   Polycythemia: monitored from prior PCP. Reports generalized fatigue and facial flushing   Restrictive lung disease: waiting on Prior auth for [facility]. Ran out of clinic sample of Trelegy.   Anxiety: has Ativan PRN. Usually only takes 1-2 pills per week.   PERTINENT  PMH / PSH: Reviewed and updated   OBJECTIVE:   BP 138/78   Pulse 69   Ht 5\' 1"  (1.549 m)   Wt 179 lb 3.2 oz (81.3 kg)   SpO2 99%   BMI 33.86 kg/m   Well-appearing, no acute distress, facial flushing noted  Cardio: Regular rate, regular rhythm, no murmurs on exam. Pulm: Clear, no wheezing, no crackles. No increased work of breathing Abdominal: bowel sounds present, soft, non-tender, non-distended Extremities: no peripheral edema  Neuro: alert and oriented x3, speech normal in content, no facial asymmetry, strength intact and equal bilaterally in UE and LE, pupils equal and reactive to light.  Psych:  Cognition and judgment appear intact. Alert, communicative  and cooperative with normal attention span and concentration. No apparent delusions, illusions, hallucinations      06/17/2023    9:28 AM 05/27/2023    4:29 PM 03/18/2023   11:16 AM  PHQ9 SCORE ONLY  PHQ-9 Total Score 10 11 11       ASSESSMENT/PLAN:   Restrictive lung disease Sample of Trelegy given today. Awaiting prior auth for [facility].   DDD (degenerative disc disease), cervical Pain  continuous and worsening. Will order MRI of c-spine to monitor known bulging disc from prior study. 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

## 2023-07-02 NOTE — Assessment & Plan Note (Signed)
Sample of Trelegy given today. Awaiting prior auth for [facility].

## 2023-07-02 NOTE — Assessment & Plan Note (Signed)
Pain continuous and worsening. Will order MRI of c-spine to monitor known bulging disc from prior study. If significantly worsened will send referral to neurosurgery for eval

## 2023-07-02 NOTE — Assessment & Plan Note (Signed)
Appeared to have work up with prior PCP, will send referral to heme/onc for eval and further workup/management

## 2023-07-02 NOTE — Assessment & Plan Note (Signed)
Compliant on CPAP nightly

## 2023-07-02 NOTE — Patient Instructions (Signed)
It was great to see you today!   Today we addressed: Neck pain: I have ordered an MRI of your spine  I am also sending a referral to Hematology to further investigate your blood. You should hear a call back in 1-2 weeks.   Future Appointments  Date Time Provider Department Center  07/04/2023  1:00 PM Buck Mam, LCSW [facility] None  09/12/2023 11:30 AM Arnaldo Natal, NP [facility]  06/05/2024  2:30 PM [facility] ANNUAL WELLNESS VISIT [facility]    Please arrive 15 minutes before your appointment to ensure smooth check in process.    Please call the clinic at [phone] if your symptoms worsen or you have any concerns.  Thank you for allowing me to participate in your care, Dr. Glendale Chard [facility]

## 2023-07-02 NOTE — Addendum Note (Signed)
Addended by: Glendale Chard on: 07/02/2023 11:11 AM   Modules accepted: Orders

## 2023-07-03 NOTE — Progress Notes (Signed)
Hi!  Dr Andree Coss is saying that they can not offer her very much. He is saying she needs to go to PT and pain management. If you would, please put in a referral to [facility]. They will take care of PT and Pain mgnt.  Thanks,  Clemencia Course, CMA

## 2023-07-04 ENCOUNTER — Ambulatory Visit: Payer: Self-pay | Admitting: *Deleted

## 2023-07-04 NOTE — Patient Outreach (Signed)
  Care Coordination   Follow Up Visit Note   07/04/2023 Name: Cassandra Reid MRN: [id number] DOB: [birth year]  Cassandra Reid is a 67 y.o. year old female who sees Glendale Chard, DO for primary care. I spoke with  Cassandra Reid by phone today.  What matters to the patients health and wellness today?  Moving soon- less rent!    Goals Addressed             This Visit's Progress    Reduce depression symptoms and enhance quality of life       Activities and task to complete in order to accomplish goals.   Glad you have plans  to move later this month to more affordable housing   Expect phone call from [facility]  to schedule your counseling appointment. Keep all upcoming appointments as discussed today Continue with compliance of taking medications as prescribed by Doctor remembering the Duloxetine is a daily medication and not as needed Call [phone] for 24/7 mental health crisis line support           SDOH assessments and interventions completed:  Yes     Care Coordination Interventions:  Yes, provided  Interventions Today    Flowsheet Row Most Recent Value  Chronic Disease   Chronic disease during today's visit Other  [depression]  General Interventions   General Interventions Discussed/Reviewed [facility], General Interventions Discussed  [Pt pleased she has found housing which will cost her about $60 less per month- plans to move later this month]  Mental Health Interventions   Mental Health Discussed/Reviewed Mental Health Discussed, Mental Health Reviewed, Coping Strategies, Depression  [Pt agreeable to referral being sent to in-network counseling provider-CSW will place  referral today]  Pharmacy Interventions   Pharmacy Dicussed/Reviewed Pharmacy Topics Discussed, Medications and their functions, Medication Adherence       Follow up plan: Follow up call scheduled for 07/11/23    Encounter Outcome:  Pt. Visit Completed

## 2023-07-04 NOTE — Patient Instructions (Signed)
Visit Information  Thank you for taking time to visit with me today. Please don't hesitate to contact me if I can be of assistance to you.   Following are the goals we discussed today:   Goals Addressed             This Visit's Progress    Reduce depression symptoms and enhance quality of life       Activities and task to complete in order to accomplish goals.   Glad you have plans  to move later this month to more affordable housing   Expect phone call from [facility]  to schedule your counseling appointment. Keep all upcoming appointments as discussed today Continue with compliance of taking medications as prescribed by Doctor remembering the Duloxetine is a daily medication and not as needed Call [phone] for 24/7 mental health crisis line support           Our next appointment is by telephone on 07/11/23    Please call the care guide team at [phone] if you need to cancel or reschedule your appointment.   If you are experiencing a Mental Health or Behavioral Health Crisis or need someone to talk to, please call the Suicide and Crisis Lifeline: 988 call 911   The patient verbalized understanding of instructions, educational materials, and care plan provided today and DECLINED offer to receive copy of patient instructions, educational materials, and care plan.   Telephone follow up appointment with care management team member scheduled for: 07/11/23  Reece Levy, MSW, LCSW Clinical Social Worker [facility] [employer] [facility] [phone]

## 2023-07-11 ENCOUNTER — Ambulatory Visit: Payer: Self-pay | Admitting: *Deleted

## 2023-07-11 ENCOUNTER — Telehealth: Payer: Self-pay

## 2023-07-11 NOTE — Telephone Encounter (Signed)
Informed patient via mychart regarding novo nordisk shipment.  4 boxes Fiasp 4 boxes Tresiba 4 boxes Ozempic 2mg  dose pens  All labeled and ready in med room fridge.

## 2023-07-11 NOTE — Patient Instructions (Signed)
Visit Information  Thank you for taking time to visit with me today. Please don't hesitate to contact me if I can be of assistance to you.   Following are the goals we discussed today:   Goals Addressed             This Visit's Progress    Reduce depression symptoms and enhance quality of life       Activities and task to complete in order to accomplish goals.   Good luck with your move this week Expect paperwork to be mailed to you from [facility]  to schedule your counseling appointment. Keep all upcoming appointments as discussed today Continue with compliance of taking medications as prescribed by Doctor remembering the Duloxetine is a daily medication and not as needed Call [phone] for 24/7 mental health crisis line support           Our next appointment is by telephone on 07/24/23  Please call the care guide team at [phone] if you need to cancel or reschedule your appointment.   If you are experiencing a Mental Health or Behavioral Health Crisis or need someone to talk to, please call the Suicide and Crisis Lifeline: 988 call 911   The patient verbalized understanding of instructions, educational materials, and care plan provided today and DECLINED offer to receive copy of patient instructions, educational materials, and care plan.   Telephone follow up appointment with care management team member scheduled for: 07/24/23  Reece Levy, MSW, LCSW Clinical Social Worker [phone]

## 2023-07-11 NOTE — Patient Outreach (Signed)
  Care Coordination   Follow Up Visit Note   07/11/2023 Name: Cassandra Reid MRN: [id number] DOB: 07/07/1956  Cassandra Reid is a 67 y.o. year old female who sees Cassandra Chard, DO for primary care. I spoke with  Cassandra Reid by phone today.  What matters to the patients health and wellness today?  Movers come tomorrow. MRI 07/31/23 scheduled.    Goals Addressed             This Visit's Progress    Reduce depression symptoms and enhance quality of life       Activities and task to complete in order to accomplish goals.   Good luck with your move this week Expect paperwork to be mailed to you from [facility]  to schedule your counseling appointment. Keep all upcoming appointments as discussed today Continue with compliance of taking medications as prescribed by Doctor remembering the Duloxetine is a daily medication and not as needed Call [phone] for 24/7 mental health crisis line support           SDOH assessments and interventions completed:  Yes     Care Coordination Interventions:  Yes, provided  Interventions Today    Flowsheet Row Most Recent Value  Chronic Disease   Chronic disease during today's visit Other  [depression]  General Interventions   General Interventions Discussed/Reviewed Community Resources  Mental Health Interventions   Mental Health Discussed/Reviewed Mental Health Discussed, Depression  [CSW contacted [facility] and requested paperwork be mailed to her for completion]       Follow up plan: Follow up call scheduled for 07/24/23    Encounter Outcome:  Pt. Visit Completed

## 2023-07-14 ENCOUNTER — Encounter: Payer: Self-pay | Admitting: Student

## 2023-07-15 NOTE — Telephone Encounter (Signed)
 Patient presents to clinic. Provided with shipment per note from [name].   Veronda Prude, RN

## 2023-07-18 ENCOUNTER — Other Ambulatory Visit: Payer: Self-pay | Admitting: Nurse Practitioner

## 2023-07-19 ENCOUNTER — Telehealth: Payer: Self-pay | Admitting: Pharmacist

## 2023-07-19 NOTE — Telephone Encounter (Signed)
Reviewed and agree with Dr Koval's plan.   

## 2023-07-19 NOTE — Telephone Encounter (Signed)
Patient returned call RE Trelegy sample.   Since last contact patient reports she has been able to request paperwork from her pharmacist and plans to return necessary paperwork for [employer] application early next week.   Medication Samples have been provided for the patient to pick-up later today.  Placed at front desk [facility].   Drug name: Trelegy       Strength: 200        Qty: 28 day supply  LOT: [id number], [id number]  Exp.Date: 12/19/2024  Dosing instructions: 1 inhalation daily  The patient has been instructed regarding the correct time, dose, and frequency of taking this medication, including desired effects and most common side effects.   Cassandra Reid 9:09 AM 07/19/2023    Total time with patient call and documentation of interaction: 11 minutes.

## 2023-07-19 NOTE — Telephone Encounter (Signed)
Attempted to contact patient for follow-up of Trelegy Sample request.    Left HIPAA compliant voice mail requesting call back to direct phone: [phone]  Total time with patient call and documentation of interaction: 5 minutes.  Follow-up phone call planned: later today

## 2023-07-23 ENCOUNTER — Encounter: Payer: Self-pay | Admitting: Student

## 2023-07-23 ENCOUNTER — Encounter: Payer: Self-pay | Admitting: Family Medicine

## 2023-07-23 DIAGNOSIS — B370 Candidal stomatitis: Secondary | ICD-10-CM

## 2023-07-23 MED ORDER — MAGIC MOUTHWASH
5.0000 mL | Freq: Three times a day (TID) | ORAL | 0 refills | Status: DC
Start: 2023-07-23 — End: 2023-07-25

## 2023-07-24 ENCOUNTER — Ambulatory Visit: Payer: Self-pay | Admitting: *Deleted

## 2023-07-24 ENCOUNTER — Ambulatory Visit: Payer: Medicare PPO

## 2023-07-24 NOTE — Patient Instructions (Signed)
Visit Information  Thank you for taking time to visit with me today. Please don't hesitate to contact me if I can be of assistance to you.   Following are the goals we discussed today:   Goals Addressed             This Visit's Progress    Reduce depression symptoms and enhance quality of life       Activities and task to complete in order to accomplish goals.   Glad your move went well- hope you can meet some new neighbors  Awaiting call once paperwork mailed to [facility], has been processed to schedule your counseling appointment. Keep all upcoming appointments as discussed today Continue with compliance of taking medications as prescribed by Doctor remembering the Duloxetine is a daily medication and not as needed- expect  follow up call from Dr Raymondo Band about your polypharmacy and questions you have Call [phone] for 24/7 mental health crisis line support           Our next appointment is by telephone on 08/07/23   Please call the care guide team at [phone] if you need to cancel or reschedule your appointment.   If you are experiencing a Mental Health or Behavioral Health Crisis or need someone to talk to, please call the Suicide and Crisis Lifeline: 988 call 911   The patient verbalized understanding of instructions, educational materials, and care plan provided today and DECLINED offer to receive copy of patient instructions, educational materials, and care plan.   Telephone follow up appointment with care management team member scheduled for:08/07/23  Reece Levy, MSW, LCSW Clinical Social Worker [phone]

## 2023-07-24 NOTE — Patient Outreach (Signed)
  Care Coordination   Follow Up Visit Note   07/24/2023 Name: Cassandra Reid MRN: [id number] DOB: [birth year]  Cassandra Reid is a 67 y.o. year old female who sees Glendale Chard, DO for primary care. I spoke with  [name] by phone today.  What matters to the patients health and wellness today?  Got moved to more affordable apartment, awaiting paperwork to be processed to start counseling.  Goals Addressed             This Visit's Progress    Reduce depression symptoms and enhance quality of life       Activities and task to complete in order to accomplish goals.   Glad your move went well- hope you can meet some new neighbors  Awaiting call once paperwork mailed to [facility], has been processed to schedule your counseling appointment. Keep all upcoming appointments as discussed today Continue with compliance of taking medications as prescribed by Doctor remembering the Duloxetine is a daily medication and not as needed- expect  follow up call from Dr Raymondo Band about your polypharmacy and questions you have Call [phone] for 24/7 mental health crisis line support           SDOH assessments and interventions completed:  Yes     Care Coordination Interventions:  Yes, provided  Interventions Today    Flowsheet Row Most Recent Value  Chronic Disease   Chronic disease during today's visit Other  [depression]  Mental Health Interventions   Mental Health Discussed/Reviewed Mental Health Discussed, Anxiety, Coping Strategies, Depression  [hard to sleep/worry, etc  paperwork for counseling mailed in and awaiting call to schedule]  Pharmacy Interventions   Pharmacy Dicussed/Reviewed Pharmacy Topics Discussed  [Pt w/ GI and sleep issues. Is now taking the Duloxetine as prescribed (was taking it "here and there" before our last conversation) and feels she has less crying but more anxiety- Pt also shares she is on a lot of RX and would like to review w/Dr Koval-]   Safety Interventions   Safety Discussed/Reviewed Safety Discussed  [Pt has gotten moved into her new apartment and is feeling good- she hopes to meet some neighbors as it cools off more.]       Follow up plan: Follow up call scheduled for 08/07/23    Encounter Outcome:  Patient Visit Completed

## 2023-07-25 ENCOUNTER — Other Ambulatory Visit (HOSPITAL_COMMUNITY): Payer: Self-pay

## 2023-07-25 MED ORDER — NYSTATIN 100000 UNIT/ML MT SUSP
5.0000 mL | Freq: Three times a day (TID) | OROMUCOSAL | 0 refills | Status: DC
  Filled 2023-07-25: qty 240, 16d supply, fill #0

## 2023-07-26 ENCOUNTER — Telehealth: Payer: Self-pay | Admitting: *Deleted

## 2023-07-26 NOTE — Telephone Encounter (Signed)
   Telephone encounter was:  Successful.  07/26/2023 Name: Cassandra Reid MRN: [id number] DOB: March 06, 1956  Cassandra Reid is a 67 y.o. year old female who is a primary care patient of Glendale Chard, DO . The community resource team was consulted for assistance with Transportation Needs   Care guide performed the following interventions: Patient provided with information about care guide support team and interviewed to confirm resource needs. Completed 2 rides scheduled for next week through the concierge line  Follow Up Plan:  No further follow up planned at this time. The patient has been provided with needed resources. Cassandra Reid [facility]  Population Health Careguide  Direct Dial: [phone] Website: [email or web address]

## 2023-07-30 NOTE — Telephone Encounter (Signed)
Application pending.   Faxed part d card 07/16/23  Faxed 2024 oop expenses 07/30/23

## 2023-07-31 ENCOUNTER — Ambulatory Visit
Admission: RE | Admit: 2023-07-31 | Discharge: 2023-07-31 | Disposition: A | Discharge status: Home / Self Care | Payer: Medicare PPO | Source: Ambulatory Visit | Attending: Family Medicine | Admitting: Family Medicine

## 2023-07-31 DIAGNOSIS — M5030 Other cervical disc degeneration, unspecified cervical region: Secondary | ICD-10-CM | POA: Diagnosis not present

## 2023-08-01 ENCOUNTER — Other Ambulatory Visit (HOSPITAL_COMMUNITY): Payer: Self-pay

## 2023-08-02 ENCOUNTER — Inpatient Hospital Stay: Payer: Medicare PPO | Attending: Hematology and Oncology | Admitting: Hematology and Oncology

## 2023-08-02 ENCOUNTER — Inpatient Hospital Stay: Payer: Medicare PPO

## 2023-08-02 VITALS — BP 156/60 | HR 86 | Temp 97.3°F | Resp 18 | Ht 61.0 in | Wt 180.0 lb

## 2023-08-02 DIAGNOSIS — Z809 Family history of malignant neoplasm, unspecified: Secondary | ICD-10-CM | POA: Diagnosis not present

## 2023-08-02 DIAGNOSIS — Z803 Family history of malignant neoplasm of breast: Secondary | ICD-10-CM | POA: Insufficient documentation

## 2023-08-02 DIAGNOSIS — E1122 Type 2 diabetes mellitus with diabetic chronic kidney disease: Secondary | ICD-10-CM | POA: Insufficient documentation

## 2023-08-02 DIAGNOSIS — Z8349 Family history of other endocrine, nutritional and metabolic diseases: Secondary | ICD-10-CM | POA: Insufficient documentation

## 2023-08-02 DIAGNOSIS — G4733 Obstructive sleep apnea (adult) (pediatric): Secondary | ICD-10-CM | POA: Diagnosis not present

## 2023-08-02 DIAGNOSIS — Z825 Family history of asthma and other chronic lower respiratory diseases: Secondary | ICD-10-CM | POA: Diagnosis not present

## 2023-08-02 DIAGNOSIS — K219 Gastro-esophageal reflux disease without esophagitis: Secondary | ICD-10-CM | POA: Insufficient documentation

## 2023-08-02 DIAGNOSIS — Z90710 Acquired absence of both cervix and uterus: Secondary | ICD-10-CM | POA: Insufficient documentation

## 2023-08-02 DIAGNOSIS — Z9049 Acquired absence of other specified parts of digestive tract: Secondary | ICD-10-CM | POA: Diagnosis not present

## 2023-08-02 DIAGNOSIS — Z8249 Family history of ischemic heart disease and other diseases of the circulatory system: Secondary | ICD-10-CM | POA: Insufficient documentation

## 2023-08-02 DIAGNOSIS — Z833 Family history of diabetes mellitus: Secondary | ICD-10-CM | POA: Diagnosis not present

## 2023-08-02 DIAGNOSIS — Z841 Family history of disorders of kidney and ureter: Secondary | ICD-10-CM | POA: Insufficient documentation

## 2023-08-02 DIAGNOSIS — Z811 Family history of alcohol abuse and dependence: Secondary | ICD-10-CM | POA: Diagnosis not present

## 2023-08-02 DIAGNOSIS — Z5986 Financial insecurity: Secondary | ICD-10-CM | POA: Insufficient documentation

## 2023-08-02 DIAGNOSIS — Z794 Long term (current) use of insulin: Secondary | ICD-10-CM | POA: Diagnosis not present

## 2023-08-02 DIAGNOSIS — Z83438 Family history of other disorder of lipoprotein metabolism and other lipidemia: Secondary | ICD-10-CM | POA: Diagnosis not present

## 2023-08-02 DIAGNOSIS — Z87891 Personal history of nicotine dependence: Secondary | ICD-10-CM | POA: Insufficient documentation

## 2023-08-02 DIAGNOSIS — N183 Chronic kidney disease, stage 3 unspecified: Secondary | ICD-10-CM | POA: Insufficient documentation

## 2023-08-02 DIAGNOSIS — Z79899 Other long term (current) drug therapy: Secondary | ICD-10-CM | POA: Insufficient documentation

## 2023-08-02 DIAGNOSIS — J984 Other disorders of lung: Secondary | ICD-10-CM | POA: Diagnosis not present

## 2023-08-02 DIAGNOSIS — D751 Secondary polycythemia: Secondary | ICD-10-CM | POA: Diagnosis not present

## 2023-08-02 DIAGNOSIS — Z5982 Transportation insecurity: Secondary | ICD-10-CM | POA: Diagnosis not present

## 2023-08-02 NOTE — Progress Notes (Signed)
[facility] CONSULT NOTE  Patient Care Team: Glendale Chard, DO as PCP - General (Family Medicine) Jens Som Madolyn Frieze, MD as PCP - Cardiology (Cardiology) Shamleffer, Konrad Dolores, MD as Consulting Physician (Endocrinology) Drema Dallas, DO as Consulting Physician (Neurology) Tomma Lightning, MD as Consulting Physician (Pulmonary Disease) Hilbert Corrigan, PsyD (Inactive) (Psychology) Buck Mam, LCSW as Social Worker  CHIEF COMPLAINTS/PURPOSE OF CONSULTATION:  Secondary polycythemia  HISTORY OF PRESENTING ILLNESS:  Cassandra Reid 67 y.o. female is here because of elevated hemoglobin levels.  Patient has a history of cerebral aneurysms and had undergone surgeries for that in the past.  She also has a history of restrictive lung disease.  She also has history of obstructive sleep apnea for which she uses a CPAP machine.  Most recently blood work showed an elevated hemoglobin of 17 and she was referred to [place] for evaluation.  Workup done as outpatient by primary care revealed that the JAK2 mutation was negative the troponin level was low.  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  She was referred to [place] for evaluation for phlebotomy.  Her major symptoms are related to see feeling sluggish and fatigued.  I reviewed her records extensively and collaborated the history with the patient.   MEDICAL HISTORY:  Past Medical History:  Diagnosis Date   Cerebral aneurysm without rupture    CKD stage 3 due to type 2 diabetes mellitus 04/15/2019   DDD (degenerative disc disease), cervical 04/16/2019   Encephalomalacia on imaging study 08/25/2020   Brain MRI 08/25/21: Few scattered areas of encephalomalacia and gliosis within the overlying left frontal lobe most likely postoperative in nature.   Fatty liver 05/31/2020   Generalized anxiety disorder    GERD (gastroesophageal reflux disease)    Hyperlipidemia associated with type 2 diabetes mellitus 07/18/2018    Hypertension associated with diabetes 07/18/2018   Incontinence 09/22/2021   Bowel and bladder.    Legally blind    Major depressive disorder    Osteoporosis 01/18/2021   Paroxysmal atrial tachycardia    On telemetry   Type 2 diabetes mellitus with diabetic polyneuropathy, with long-term current use of insulin 07/18/2018   has dexcom G7  - sensor left upper arm    SURGICAL HISTORY: Past Surgical History:  Procedure Laterality Date   ABDOMINAL HYSTERECTOMY  2012   APPENDECTOMY  1972   CEREBRAL ANEURYSM REPAIR Left 2004   Aneurysm clip in place near the level of the left ICA on Brain MRI 08/2020. Also coiling performed   CHOLECYSTECTOMY  2015   COLONOSCOPY  11/24/2020   in [place]   CRANIOTOMY Left    left pterional craniotomy   ETHMOIDECTOMY Left 11/23/2022   Procedure: LEFT ANTERIOR ETHMOIDECTOMY;  Surgeon: Laren Boom, DO;  Location: [facility] OR;  Service: ENT;  Laterality: Left;   EYE SURGERY  2023   MAXILLARY ANTROSTOMY Left 11/23/2022   Procedure: MAXILLARY ANTROSTOMY;  Surgeon: Laren Boom, DO;  Location: [facility] OR;  Service: ENT;  Laterality: Left;   MENISCUS REPAIR Bilateral    NASAL TURBINATE REDUCTION Bilateral 11/23/2022   Procedure: Bilateral turbinate reduction;  Surgeon: Laren Boom, DO;  Location: [facility] OR;  Service: ENT;  Laterality: Bilateral;   SINUS ENDO W/FUSION Left 11/23/2022   Procedure: Functional endoscopic sinus surgery with navigation.;  Surgeon: Laren Boom, DO;  Location: [facility] OR;  Service: ENT;  Laterality: Left;   TUBAL LIGATION  1990   UPPER GI ENDOSCOPY  11/24/2020  in [place]    SOCIAL HISTORY: Social History   Socioeconomic History   Marital status: Divorced    Spouse name: John   Number of children: 3   Years of education: 14   Highest education level: Associate degree: occupational, Scientist, product/process development, or vocational program  Occupational History   Occupation: disabled  Tobacco Use   Smoking status: Former    Current packs/day:  0.00    Average packs/day: 1 pack/day for 3.0 years (3.0 ttl pk-yrs)    Types: Cigarettes    Start date: 06/19/1964    Quit date: 06/20/1967    Years since quitting: 56.1    Passive exposure: Past   Smokeless tobacco: Never  Vaping Use   Vaping status: Never Used  Substance and Sexual Activity   Alcohol use: Never   Drug use: Not Currently    Types: Cocaine, Marijuana    Comment: 1976-1980   Sexual activity: Not Currently    Birth control/protection: Surgical    Comment: Hysterectomy  Other Topics Concern   Not on file  Social History Narrative   Patient lives in [place].    Patient walks her dog daily for exercise.    Patient is separated from her husband.   Patient does not drive. Uses home delivery services and ubers.    Right handed   One story apartment   Caffeine on daily   Social Determinants of Health   Financial Resource Strain: Medium Risk (05/27/2023)   Overall Financial Resource Strain (CARDIA)    Difficulty of Paying Living Expenses: Somewhat hard  Food Insecurity: No Food Insecurity (05/27/2023)   Hunger Vital Sign    Worried About Running Out of Food in the Last Year: Never true    Ran Out of Food in the Last Year: Never true  Transportation Needs: Unmet Transportation Needs (05/27/2023)   PRAPARE - Administrator, Civil Service (Medical): Yes    Lack of Transportation (Non-Medical): Yes  Physical Activity: Insufficiently Active (05/27/2023)   Exercise Vital Sign    Days of Exercise per Week: 3 days    Minutes of Exercise per Session: 30 min  Stress: Stress Concern Present (06/05/2023)   [employer] of Occupational Health - Occupational Stress Questionnaire    Feeling of Stress : Very much  Social Connections: Socially Isolated (05/27/2023)   Social Connection and Isolation Panel [NHANES]    Frequency of Communication with Friends and Family: Once a week    Frequency of Social Gatherings with Friends and Family: Never    Attends Religious  Services: Never    Database administrator or Organizations: No    Attends Banker Meetings: Never    Marital Status: Divorced  Catering manager Violence: Not At Risk (05/27/2023)   Humiliation, Afraid, Rape, and Kick questionnaire    Fear of Current or Ex-Partner: No    Emotionally Abused: No    Physically Abused: No    Sexually Abused: No    FAMILY HISTORY: Family History  Problem Relation Age of Onset   Alcohol abuse Mother    Diabetes Mother    Hypertension Mother    Kidney disease Mother    COPD Father    Diabetes Father    Early death Father    Heart disease Father    Hyperlipidemia Father    Hypertension Father    Diabetes Sister    Hypertension Sister    Breast cancer Sister    Diabetes Sister    Hypertension Sister  Kidney disease Sister    Cancer Maternal Grandmother    Alcohol abuse Maternal Grandfather    Cancer Maternal Grandfather    Cancer Paternal Grandmother    Cancer Paternal Grandfather     ALLERGIES:  has No Known Allergies.  MEDICATIONS:  Current Outpatient Medications  Medication Sig Dispense Refill   albuterol (VENTOLIN HFA) 108 (90 Base) MCG/ACT inhaler INHALE 2 PUFFS INTO THE LUNGS 2 (TWO) TIMES DAILY. PLUS 2 PUFFS AS NEEDED 18 each 1   amLODipine (NORVASC) 5 MG tablet TAKE 1 TABLET EVERY DAY 90 tablet 3   Ascorbic Acid (VITAMIN C ADULT GUMMIES PO) Take 1 each by mouth daily in the afternoon.     atorvastatin (LIPITOR) 40 MG tablet Take 40 mg by mouth daily.     BD PEN NEEDLE NANO 2ND GEN 32G X 4 MM MISC USE WITH INSULIN 100 each 4   blood glucose meter kit and supplies Dispense based on patient and insurance preference. Use up to four times daily as directed. (FOR ICD-10 E10.9, E11.9). 1 each 0   Continuous Blood Gluc Sensor (DEXCOM G6 SENSOR) MISC Use as directed to check blood sugar. 4 each 11   Continuous Blood Gluc Transmit (DEXCOM G6 TRANSMITTER) MISC Use as directed to check blood sugar. 1 each 12   dapagliflozin  propanediol (FARXIGA) 10 MG TABS tablet Take 1 tablet (10 mg total) by mouth daily. 90 tablet 3   DULoxetine (CYMBALTA) 60 MG capsule Take 1 capsule (60 mg total) by mouth daily. 90 capsule 3   ezetimibe (ZETIA) 10 MG tablet TAKE 1 TABLET EVERY DAY 90 tablet 3   famotidine (PEPCID) 20 MG tablet Take 1 tablet (20 mg total) by mouth daily. 90 tablet 3   FIBER SELECT GUMMIES PO Take 1 tablet by mouth daily in the afternoon.     fluticasone (FLONASE) 50 MCG/ACT nasal spray Place 1 spray into both nostrils daily as needed for allergies or rhinitis. 9.9 mL 1   fluticasone furoate-vilanterol (BREO ELLIPTA) 200-25 MCG/ACT AEPB Inhale 1 puff into the lungs daily. 60 each 3   Fluticasone-Umeclidin-Vilant (TRELEGY ELLIPTA) 200-62.5-25 MCG/ACT AEPB Inhale 1 puff into the lungs daily.     furosemide (LASIX) 40 MG tablet Take 1 tablet (40 mg total) by mouth as needed for edema. (Patient taking differently: Take 40 mg by mouth daily as needed (ankle swelling.).) 90 tablet 3   gabapentin (NEURONTIN) 600 MG tablet TAKE 0.5-1 TABLET (300-600 MG TOTAL) BY MOUTH 3 (THREE) TIMES DAILY. (Patient taking differently: Take 600 mg by mouth daily.) 270 tablet 3   insulin aspart (FIASP) 100 UNIT/ML FlexTouch Pen Inject 14-16 Units into the skin daily with breakfast AND 12-14 Units daily with lunch AND 10-12 Units daily with supper. This is a patient assistance medication. Patient may not be approved and/or have medication. Please ask and verify when performing med review.. 15 mL 3   insulin degludec (TRESIBA) 100 UNIT/ML FlexTouch Pen Inject 50 Units into the skin daily. This is a patient assistance medication. Patient may not be approved and/or have medication. Please ask and verify when performing med review. 15 mL 3   lidocaine (LIDODERM) 5 % Place 1 patch onto the skin daily. Remove & Discard patch within 12 hours or as directed by MD 30 patch 0   lisinopril (ZESTRIL) 40 MG tablet Take 1 tablet (40 mg total) by mouth daily.  (Patient taking differently: Take 40 mg by mouth in the morning.) 90 tablet 3   loratadine (CLARITIN) 10  MG tablet TAKE 1 TABLET EVERY DAY 90 tablet 3   LORazepam (ATIVAN) 0.5 MG tablet TAKE 1 TABLET EVERY 8 HOURS AS NEEDED FOR ANXIETY 90 tablet 0   magic mouthwash (nystatin, diphenhydrAMINE, alum & mag hydroxide) suspension mixture Swish and spit 5 mLs 3 (three) times daily. 240 mL 0   Magnesium 500 MG TABS Take 500 mg by mouth daily in the afternoon.     metoprolol succinate (TOPROL-XL) 50 MG 24 hr tablet Take 1 tablet (50 mg total) by mouth daily. Take with or immediately following a meal. 90 tablet 3   Multiple Vitamin (MULTIVITAMIN WITH MINERALS) TABS tablet Take 1 tablet by mouth in the morning.     OneTouch Delica Lancets 33G MISC Please use to check blood sugar up to 4 times daily. E11.42 100 each 12   Oyster Shell (OYSTER CALCIUM) 500 MG TABS tablet Take 500 mg of elemental calcium by mouth daily in the afternoon.     pantoprazole (PROTONIX) 40 MG tablet TAKE 1 TABLET EVERY DAY 90 tablet 3   potassium chloride SA (KLOR-CON M) 20 MEQ tablet TAKE 1 TABLET AS NEEDED (TAKE AS DIRECTED WITH LASIX AS NEEDED FOR EDEMA) 90 tablet 3   Semaglutide, 2 MG/DOSE, (OZEMPIC, 2 MG/DOSE,) 8 MG/3ML SOPN Inject 2 mg into the skin once a week. 9 mL 3   traZODone (DESYREL) 50 MG tablet TAKE 1/2 TABLET BY MOUTH AT BEDTIME AS NEEDED FOR SLEEP. 45 tablet 1   TRUE METRIX BLOOD GLUCOSE TEST test strip TEST BLOOD SUGAR AS INSTRUCTED 350 strip 3   No current facility-administered medications for this visit.    REVIEW OF SYSTEMS:   Constitutional: Denies fevers, chills or abnormal night sweats All other systems were reviewed with the patient and are negative.  PHYSICAL EXAMINATION: ECOG PERFORMANCE STATUS: 1 - Symptomatic but completely ambulatory  Vitals:   08/02/23 1229  BP: (!) 156/60  Pulse: 86  Resp: 18  Temp: (!) 97.3 F (36.3 C)  SpO2: 95%   Filed Weights   08/02/23 1229  Weight: 180 lb (81.6  kg)    GENERAL:alert, no distress and comfortable  LABORATORY DATA:  I have reviewed the data as listed Lab Results  Component Value Date   WBC 7.5 05/20/2023   HGB 17.1 (H) 05/20/2023   HCT 52.1 (H) 05/20/2023   MCV 87 05/20/2023   PLT 189 05/20/2023   Lab Results  Component Value Date   NA 141 03/18/2023   K 4.2 03/18/2023   CL 104 03/18/2023   CO2 18 (L) 03/18/2023    RADIOGRAPHIC STUDIES: I have personally reviewed the radiological reports and agreed with the findings in the report.  ASSESSMENT AND PLAN:  Polycythemia, secondary Lab review 06/26/2022: Hemoglobin 15, hematocrit 47 03/18/2023: Erythropoietin: 13, JAK2 mutation: Negative 05/20/2023: Hemoglobin 17.1, hematocrit 52.1, WBC 7.5, platelets 189  Based on the above results it appears that the patient has secondary polycythemia.  Possibly related to her lung condition. We discussed the pros and cons of phlebotomy and secondary polycythemia.  The indication would be if she were to be symptomatic.  Patient reports that the fatigue is quite debilitating and therefore we will proceed with 1 unit of phlebotomy.  If she gets any improvement in her energy levels we might consider doing phlebotomies periodically.  Goal of phlebotomy: Hematocrit below 45 Return to clinic in 1 month with labs and follow-up   All questions were answered. The patient knows to call the clinic with any problems, questions or  concerns.    Tamsen Meek, MD 08/02/23

## 2023-08-02 NOTE — Assessment & Plan Note (Signed)
Lab review 06/26/2022: Hemoglobin 15, hematocrit 47 03/18/2023: Erythropoietin: 13, JAK2 mutation: Negative 05/20/2023: Hemoglobin 17.1, hematocrit 52.1, WBC 7.5, platelets 189  Based on the above results it appears that the patient has secondary polycythemia.  Possibly related to her lung condition. We did not recommend phlebotomy for secondary polycythemia unless it is accompanied by severe symptoms. Return to clinic in 1 year for follow-up to review her labs

## 2023-08-05 ENCOUNTER — Telehealth: Payer: Self-pay | Admitting: Hematology and Oncology

## 2023-08-05 NOTE — Telephone Encounter (Signed)
Pt called to inquire about scheduling phlebotomy appointments. Informed Pt I would send a message for scheduling to call her to schedule her appointments. Scheduling message sent.

## 2023-08-06 ENCOUNTER — Telehealth: Payer: Self-pay | Admitting: *Deleted

## 2023-08-06 NOTE — Telephone Encounter (Signed)
Telephone encounter was:  Successful.  08/06/2023 Name: Cassandra Reid MRN: [id number] DOB: Aug 13, 1956  Cassandra Reid is a 67 y.o. year old female who is a primary care patient of Glendale Chard, DO . The community resource team was consulted for assistance with Transportation Needs   Care guide performed the following interventions: Patient provided with information about care guide support team and interviewed to confirm resource needs. Called the concierge line to schedule transportation Benedetto Goad ) for patient going to [place] on 08/08/2023 , ride scheduled  Follow Up Plan:  No further follow up planned at this time. The patient has been provided with needed resources.  [name] [facility]  Population Health Careguide  Direct Dial: [phone] Website: [email or web address]       Cassandra Reid DOB: Aug 12, 1956 MRN: [id number]   Cassandra Reid  For purposes of improving physical access to our facilities, [facility] is pleased to partner with third parties to provide [place] patients or other authorized individuals the option of convenient, on-demand ground transportation services (the Chiropractor") through use of the technology service that enables users to request on-demand ground transportation from independent third-party providers.  By opting to use and accept these [employer], I, the undersigned, hereby agree on behalf of myself, and on behalf of any minor child using the Science writer for whom I am the parent or legal guardian, as follows:  Science writer provided to me are provided by independent third-party transportation providers who are not [employer] or employees and who are unaffiliated with [employer]. [place] is neither a transportation carrier nor a common or public carrier. [facility] has no control over the quality or safety of the transportation that occurs as a result of the [employer]. [facility] cannot guarantee that any third-party transportation provider will complete any arranged transportation service. [facility] makes no representation, warranty, or guarantee regarding the reliability, timeliness, quality, safety, suitability, or availability of any of the Transport Services or that they will be error free. I fully understand that traveling by vehicle involves risks and dangers of serious bodily injury, including permanent disability, paralysis, and death. I agree, on behalf of myself and on behalf of any minor child using the Transport Services for whom I am the parent or legal guardian, that the entire risk arising out of my use of the [employer] remains solely with me, to the maximum extent permitted under applicable law. The [employer] are provided "as is" and "as available." [facility] disclaims all representations and warranties, express, implied or statutory, not expressly set out in these terms, including the implied warranties of merchantability and fitness for a particular purpose. I hereby waive and release , its agents, employees, officers, directors, representatives, insurers, attorneys, assigns, successors, subsidiaries, and affiliates from any and all past, present, or future claims, demands, liabilities, actions, causes of action, or suits of any kind directly or indirectly arising from acceptance and use of the [employer]. I further waive and release  and its affiliates from all present and future Reid and responsibility for any injury or death to persons or damages to property caused by or related to the use of the [employer]. I have read this Waiver and Release of Reid, and I understand the terms used in it and their legal significance. This Waiver is freely and voluntarily given with the understanding that my right (as well as the right  of any minor child for whom I am the parent or legal  guardian using the [employer]) to legal recourse against [place] in connection with the [employer] is knowingly surrendered in return for use of these services.   I attest that I read the consent document to [name], gave Ms. Munz the opportunity to ask questions and answered the questions asked (if any). I affirm that [name] then provided consent for she's participation in this program.     [name]

## 2023-08-07 ENCOUNTER — Encounter: Payer: Self-pay | Admitting: *Deleted

## 2023-08-07 ENCOUNTER — Telehealth: Payer: Self-pay | Admitting: Hematology and Oncology

## 2023-08-07 NOTE — Telephone Encounter (Signed)
Patient is aware of scheduled appointment times/dates

## 2023-08-08 ENCOUNTER — Inpatient Hospital Stay: Payer: Medicare PPO

## 2023-08-08 ENCOUNTER — Telehealth: Payer: Self-pay | Admitting: *Deleted

## 2023-08-08 ENCOUNTER — Other Ambulatory Visit: Payer: Self-pay | Admitting: *Deleted

## 2023-08-08 DIAGNOSIS — E1122 Type 2 diabetes mellitus with diabetic chronic kidney disease: Secondary | ICD-10-CM | POA: Diagnosis not present

## 2023-08-08 DIAGNOSIS — Z5982 Transportation insecurity: Secondary | ICD-10-CM | POA: Diagnosis not present

## 2023-08-08 DIAGNOSIS — D751 Secondary polycythemia: Secondary | ICD-10-CM | POA: Diagnosis not present

## 2023-08-08 DIAGNOSIS — Z794 Long term (current) use of insulin: Secondary | ICD-10-CM | POA: Diagnosis not present

## 2023-08-08 DIAGNOSIS — K219 Gastro-esophageal reflux disease without esophagitis: Secondary | ICD-10-CM | POA: Diagnosis not present

## 2023-08-08 DIAGNOSIS — J984 Other disorders of lung: Secondary | ICD-10-CM | POA: Diagnosis not present

## 2023-08-08 DIAGNOSIS — Z5986 Financial insecurity: Secondary | ICD-10-CM | POA: Diagnosis not present

## 2023-08-08 DIAGNOSIS — N183 Chronic kidney disease, stage 3 unspecified: Secondary | ICD-10-CM | POA: Diagnosis not present

## 2023-08-08 DIAGNOSIS — G4733 Obstructive sleep apnea (adult) (pediatric): Secondary | ICD-10-CM | POA: Diagnosis not present

## 2023-08-08 LAB — CMP (CANCER CENTER ONLY)
ALT: 37 U/L (ref 0–44)
AST: 23 U/L (ref 15–41)
Albumin: 4.3 g/dL (ref 3.5–5.0)
Alkaline Phosphatase: 83 U/L (ref 38–126)
Anion gap: 8 (ref 5–15)
BUN: 18 mg/dL (ref 8–23)
CO2: 24 mmol/L (ref 22–32)
Calcium: 9.5 mg/dL (ref 8.9–10.3)
Chloride: 106 mmol/L (ref 98–111)
Creatinine: 1.09 mg/dL — ABNORMAL HIGH (ref 0.44–1.00)
GFR, Estimated: 56 mL/min — ABNORMAL LOW (ref >60)
Glucose, Bld: 211 mg/dL — ABNORMAL HIGH (ref 70–99)
Potassium: 4.1 mmol/L (ref 3.5–5.1)
Sodium: 138 mmol/L (ref 135–145)
Total Bilirubin: 0.7 mg/dL (ref 0.3–1.2)
Total Protein: 6.8 g/dL (ref 6.5–8.1)

## 2023-08-08 LAB — CBC WITH DIFFERENTIAL (CANCER CENTER ONLY)
Abs Immature Granulocytes: 0.06 10*3/uL (ref 0.00–0.07)
Basophils Absolute: 0.1 10*3/uL (ref 0.0–0.1)
Basophils Relative: 1 %
Eosinophils Absolute: 0.2 10*3/uL (ref 0.0–0.5)
Eosinophils Relative: 3 %
HCT: 47.9 % — ABNORMAL HIGH (ref 36.0–46.0)
Hemoglobin: 16.5 g/dL — ABNORMAL HIGH (ref 12.0–15.0)
Immature Granulocytes: 1 %
Lymphocytes Relative: 16 %
Lymphs Abs: 1.3 10*3/uL (ref 0.7–4.0)
MCH: 29.3 pg (ref 26.0–34.0)
MCHC: 34.4 g/dL (ref 30.0–36.0)
MCV: 84.9 fL (ref 80.0–100.0)
Monocytes Absolute: 0.5 10*3/uL (ref 0.1–1.0)
Monocytes Relative: 7 %
Neutro Abs: 5.6 10*3/uL (ref 1.7–7.7)
Neutrophils Relative %: 72 %
Platelet Count: 176 10*3/uL (ref 150–400)
RBC: 5.64 MIL/uL — ABNORMAL HIGH (ref 3.87–5.11)
RDW: 13.8 % (ref 11.5–15.5)
WBC Count: 7.7 10*3/uL (ref 4.0–10.5)
nRBC: 0 % (ref 0.0–0.2)

## 2023-08-08 NOTE — Patient Outreach (Signed)
Care Coordination   08/08/2023 Name: Cassandra Reid MRN: [id number] DOB: 02/12/1956   Care Coordination Outreach Attempts:  An unsuccessful telephone outreach was attempted today to offer the patient information about available care coordination services.  Follow Up Plan:  Additional outreach attempts will be made to offer the patient care coordination information and services.   Encounter Outcome:  No Answer   Care Coordination Interventions:  No, not indicated    Reece Levy, MSW, LCSW Clinical Social Worker [phone]

## 2023-08-09 ENCOUNTER — Encounter: Payer: Self-pay | Admitting: Student

## 2023-08-12 ENCOUNTER — Inpatient Hospital Stay: Payer: Medicare PPO

## 2023-08-12 VITALS — BP 140/78 | HR 69 | Temp 98.7°F | Resp 16

## 2023-08-12 DIAGNOSIS — G4733 Obstructive sleep apnea (adult) (pediatric): Secondary | ICD-10-CM | POA: Diagnosis not present

## 2023-08-12 DIAGNOSIS — N183 Chronic kidney disease, stage 3 unspecified: Secondary | ICD-10-CM | POA: Diagnosis not present

## 2023-08-12 DIAGNOSIS — K219 Gastro-esophageal reflux disease without esophagitis: Secondary | ICD-10-CM | POA: Diagnosis not present

## 2023-08-12 DIAGNOSIS — Z5986 Financial insecurity: Secondary | ICD-10-CM | POA: Diagnosis not present

## 2023-08-12 DIAGNOSIS — D751 Secondary polycythemia: Secondary | ICD-10-CM | POA: Diagnosis not present

## 2023-08-12 DIAGNOSIS — E1122 Type 2 diabetes mellitus with diabetic chronic kidney disease: Secondary | ICD-10-CM | POA: Diagnosis not present

## 2023-08-12 DIAGNOSIS — Z5982 Transportation insecurity: Secondary | ICD-10-CM | POA: Diagnosis not present

## 2023-08-12 DIAGNOSIS — J984 Other disorders of lung: Secondary | ICD-10-CM | POA: Diagnosis not present

## 2023-08-12 DIAGNOSIS — Z794 Long term (current) use of insulin: Secondary | ICD-10-CM | POA: Diagnosis not present

## 2023-08-12 NOTE — Patient Instructions (Signed)

## 2023-08-12 NOTE — Progress Notes (Signed)
Cassandra Reid presents today for phlebotomy per MD orders. Phlebotomy procedure started at 0803 and ended at 0816. 503 grams removed. Patient observed for 30 minutes after procedure without any incident. Patient tolerated procedure well. IV needle removed intact.  VSS at discharge.  Ambulated to lobby.

## 2023-08-16 ENCOUNTER — Telehealth: Payer: Self-pay | Admitting: *Deleted

## 2023-08-16 NOTE — Progress Notes (Signed)
Care Coordination Note  08/16/2023 Name: Cassandra Reid MRN: [id number] DOB: Dec 29, 1955  Cassandra Reid is a 67 y.o. year old female who is a primary care patient of Glendale Chard, DO and is actively engaged with the care management team. I reached out to [name] by phone today to assist with re-scheduling a follow up visit with the Licensed Clinical Social Worker  Follow up plan: Unsuccessful telephone outreach attempt made. A HIPAA compliant phone message was left for the patient providing contact information and requesting a return call.   [facility]  Care Coordination Care Guide  Direct Dial: [phone]

## 2023-08-20 ENCOUNTER — Encounter: Payer: Self-pay | Admitting: Student

## 2023-08-20 DIAGNOSIS — F330 Major depressive disorder, recurrent, mild: Secondary | ICD-10-CM | POA: Diagnosis not present

## 2023-08-20 DIAGNOSIS — F418 Other specified anxiety disorders: Secondary | ICD-10-CM | POA: Diagnosis not present

## 2023-08-26 NOTE — Progress Notes (Signed)
Care Coordination Note  08/26/2023 Name: Cassandra Reid MRN: [id number] DOB: 1956-04-27  Cassandra Reid is a 67 y.o. year old female who is a primary care patient of Glendale Chard, DO and is actively engaged with the care management team. I reached out to [name] by phone today to assist with re-scheduling a follow up visit with the Licensed Clinical Social Worker  Follow up plan: Unsuccessful telephone outreach attempt made. A HIPAA compliant phone message was left for the patient providing contact information and requesting a return call.  We have been unable to make contact with the patient for follow up. The care management team is available to follow up with the patient after provider conversation with the patient regarding recommendation for care management engagement and subsequent re-referral to the care management team.   [facility] Coordination Care Guide  Direct Dial: [phone]

## 2023-08-28 ENCOUNTER — Telehealth: Payer: Self-pay

## 2023-08-28 NOTE — Telephone Encounter (Signed)
Patient LVM on nurse line requesting MRI results.   She reports the imaging was performed on 9/11.  She reports she has been getting the "run around" from the imaging facility.   I do not see where we have received results in EPIC. I attempted to call her to see where the imaging was performed. However, she did not answer.   Will forward to PCP.

## 2023-08-29 ENCOUNTER — Other Ambulatory Visit: Payer: Self-pay | Admitting: Nurse Practitioner

## 2023-08-29 DIAGNOSIS — F330 Major depressive disorder, recurrent, mild: Secondary | ICD-10-CM | POA: Diagnosis not present

## 2023-08-29 DIAGNOSIS — F411 Generalized anxiety disorder: Secondary | ICD-10-CM | POA: Diagnosis not present

## 2023-08-29 NOTE — Telephone Encounter (Signed)
Reached out to [employer].   Oop that was provided isnt for all of 2024 and expenses provided from her mail order pharmacy will have to be an actual expense sheet and not screenshots.   PT aware and will reach out to both pharmacies ([facility] and mail order pharmacies).  Dr. Raymondo Band,  Do we have any other inhaler samples available in the meantime?

## 2023-08-30 NOTE — Telephone Encounter (Signed)
Patient returns call to nurse line regarding this concern.   [facility] regarding this matter.   Was advised that exam was input to be read by [facility]. Advised that our office ordered test and that patient is not established with [facility]. Technician is going to update report to have radiologist at [facility] to read.   Called patient and provided with update.   Patient appreciative.   Veronda Prude, RN

## 2023-09-03 ENCOUNTER — Encounter: Payer: Self-pay | Admitting: Family Medicine

## 2023-09-03 ENCOUNTER — Ambulatory Visit: Payer: Medicare PPO | Admitting: Family Medicine

## 2023-09-03 VITALS — BP 130/70 | Ht 61.0 in | Wt 178.0 lb

## 2023-09-03 DIAGNOSIS — M5010 Cervical disc disorder with radiculopathy, unspecified cervical region: Secondary | ICD-10-CM | POA: Diagnosis not present

## 2023-09-03 NOTE — Patient Instructions (Signed)
Your MRI shows that you have degenerative disc disease, as well as a bulging disc that is pushing on the nerves in your spinal cord in the neck.  This is contributing to your symptoms. As discussed today, your treatment options could include: 1.  Referral to physical therapy 2.  Referral to a spine surgeon to discuss possible epidural injections versus surgery 3.  Continued watchful waiting to see if your symptoms improve while continuing your gabapentin and Cymbalta   We placed a referral to a spine surgeon for you.  They will evaluate you and discuss your options.  It has been a pleasure taking care of you, do not hesitate to reach out with any questions or concerns.

## 2023-09-03 NOTE — Progress Notes (Signed)
DATE OF VISIT: 09/03/2023        Cassandra Reid DOB: January 27, 1956 MRN: [id number]  CC:  Cervical radiculopathy  History- Cassandra Reid is a 67 y.o. Rt-hand dominant female for evaluation and treatment of cervical radiculopathy. Seen at family medicine center and referred for evaluation of cervical radiculopathy.  Last seen by Dr. Ashok Cordia Demeron DO on 06/17/2023.  He noted pain and stiffness in her neck and radiating into her shoulder.  She started to note some feelings of weakness and numbness and tingling into her arms.  She has known history of cervical DDD as seen on prior MRI May 2020.  She saw Pain Mgmt for injections of occipital neuralgia - helped, but symptoms returned after 2 injections.  Pain ongoing for several months. Pain in the neck radiating into the upper back Feels like "shoulders are on fire" Has feelings of weakness into the arms Feels on both sides about the same Numbness and tingling into all fingers on both hands She did updated MRI 07/31/2023 that showed progression of prior degenerative changes most significant at C5-6 and C6-7. Has trouble going to PT due to not driving - tough for finances No prior epidural injections  Taking Gabapentin 600mg  once daily in the AM Taking Cymbalta 60mg  daily (started taking daily recently)   Past Medical History Past Medical History:  Diagnosis Date   Cerebral aneurysm without rupture    CKD stage 3 due to type 2 diabetes mellitus 04/15/2019   DDD (degenerative disc disease), cervical 04/16/2019   Encephalomalacia on imaging study 08/25/2020   Brain MRI 08/25/21: Few scattered areas of encephalomalacia and gliosis within the overlying left frontal lobe most likely postoperative in nature.   Fatty liver 05/31/2020   Generalized anxiety disorder    GERD (gastroesophageal reflux disease)    Hyperlipidemia associated with type 2 diabetes mellitus 07/18/2018   Hypertension associated with diabetes 07/18/2018   Incontinence  09/22/2021   Bowel and bladder.    Legally blind    Major depressive disorder    Osteoporosis 01/18/2021   Paroxysmal atrial tachycardia (HCC)    On telemetry   Type 2 diabetes mellitus with diabetic polyneuropathy, with long-term current use of insulin 07/18/2018   has dexcom G7  - sensor left upper arm    Past Surgical History Past Surgical History:  Procedure Laterality Date   ABDOMINAL HYSTERECTOMY  2012   APPENDECTOMY  1972   CEREBRAL ANEURYSM REPAIR Left 2004   Aneurysm clip in place near the level of the left ICA on Brain MRI 08/2020. Also coiling performed   CHOLECYSTECTOMY  2015   COLONOSCOPY  11/24/2020   in [facility]   CRANIOTOMY Left    left pterional craniotomy   ETHMOIDECTOMY Left 11/23/2022   Procedure: LEFT ANTERIOR ETHMOIDECTOMY;  Surgeon: Laren Boom, DO;  Location: [facility] OR;  Service: ENT;  Laterality: Left;   EYE SURGERY  2023   MAXILLARY ANTROSTOMY Left 11/23/2022   Procedure: MAXILLARY ANTROSTOMY;  Surgeon: Laren Boom, DO;  Location: [facility] OR;  Service: ENT;  Laterality: Left;   MENISCUS REPAIR Bilateral    NASAL TURBINATE REDUCTION Bilateral 11/23/2022   Procedure: Bilateral turbinate reduction;  Surgeon: Laren Boom, DO;  Location: [facility] OR;  Service: ENT;  Laterality: Bilateral;   SINUS ENDO W/FUSION Left 11/23/2022   Procedure: Functional endoscopic sinus surgery with navigation.;  Surgeon: Laren Boom, DO;  Location: [facility] OR;  Service: ENT;  Laterality: Left;   TUBAL LIGATION  1990  UPPER GI ENDOSCOPY  11/24/2020   in [facility]    Medications Current Outpatient Medications  Medication Sig Dispense Refill   albuterol (VENTOLIN HFA) 108 (90 Base) MCG/ACT inhaler INHALE 2 PUFFS INTO THE LUNGS 2 (TWO) TIMES DAILY. PLUS 2 PUFFS AS NEEDED 18 each 1   amLODipine (NORVASC) 5 MG tablet TAKE 1 TABLET EVERY DAY 90 tablet 3   Ascorbic Acid (VITAMIN C ADULT GUMMIES PO) Take 1 each by mouth daily in the afternoon.     atorvastatin (LIPITOR) 40 MG  tablet Take 40 mg by mouth daily.     BD PEN NEEDLE NANO 2ND GEN 32G X 4 MM MISC USE WITH INSULIN 100 each 4   blood glucose meter kit and supplies Dispense based on patient and insurance preference. Use up to four times daily as directed. (FOR ICD-10 E10.9, E11.9). 1 each 0   Continuous Blood Gluc Sensor (DEXCOM G6 SENSOR) MISC Use as directed to check blood sugar. 4 each 11   Continuous Blood Gluc Transmit (DEXCOM G6 TRANSMITTER) MISC Use as directed to check blood sugar. 1 each 12   dapagliflozin propanediol (FARXIGA) 10 MG TABS tablet Take 1 tablet (10 mg total) by mouth daily. 90 tablet 3   DULoxetine (CYMBALTA) 60 MG capsule Take 1 capsule (60 mg total) by mouth daily. 90 capsule 3   ezetimibe (ZETIA) 10 MG tablet TAKE 1 TABLET EVERY DAY 90 tablet 3   famotidine (PEPCID) 20 MG tablet Take 1 tablet (20 mg total) by mouth daily. 90 tablet 3   FIBER SELECT GUMMIES PO Take 1 tablet by mouth daily in the afternoon.     fluticasone (FLONASE) 50 MCG/ACT nasal spray Place 1 spray into both nostrils daily as needed for allergies or rhinitis. 9.9 mL 1   fluticasone furoate-vilanterol (BREO ELLIPTA) 200-25 MCG/ACT AEPB Inhale 1 puff into the lungs daily. 60 each 3   Fluticasone-Umeclidin-Vilant (TRELEGY ELLIPTA) 200-62.5-25 MCG/ACT AEPB Inhale 1 puff into the lungs daily.     furosemide (LASIX) 40 MG tablet Take 1 tablet (40 mg total) by mouth as needed for edema. (Patient taking differently: Take 40 mg by mouth daily as needed (ankle swelling.).) 90 tablet 3   gabapentin (NEURONTIN) 600 MG tablet TAKE 0.5-1 TABLET (300-600 MG TOTAL) BY MOUTH 3 (THREE) TIMES DAILY. (Patient taking differently: Take 600 mg by mouth daily.) 270 tablet 3   insulin aspart (FIASP) 100 UNIT/ML FlexTouch Pen Inject 14-16 Units into the skin daily with breakfast AND 12-14 Units daily with lunch AND 10-12 Units daily with supper. This is a patient assistance medication. Patient may not be approved and/or have medication. Please ask  and verify when performing med review.. 15 mL 3   insulin degludec (TRESIBA) 100 UNIT/ML FlexTouch Pen Inject 50 Units into the skin daily. This is a patient assistance medication. Patient may not be approved and/or have medication. Please ask and verify when performing med review. 15 mL 3   lidocaine (LIDODERM) 5 % Place 1 patch onto the skin daily. Remove & Discard patch within 12 hours or as directed by MD 30 patch 0   lisinopril (ZESTRIL) 40 MG tablet TAKE 1 TABLET EVERY DAY 90 tablet 3   loratadine (CLARITIN) 10 MG tablet TAKE 1 TABLET EVERY DAY 90 tablet 3   LORazepam (ATIVAN) 0.5 MG tablet TAKE 1 TABLET EVERY 8 HOURS AS NEEDED FOR ANXIETY 90 tablet 0   magic mouthwash (nystatin, diphenhydrAMINE, alum & mag hydroxide) suspension mixture Swish and spit 5 mLs 3 (three)  times daily. 240 mL 0   Magnesium 500 MG TABS Take 500 mg by mouth daily in the afternoon.     metoprolol succinate (TOPROL-XL) 50 MG 24 hr tablet Take 1 tablet (50 mg total) by mouth daily. Take with or immediately following a meal. 90 tablet 3   Multiple Vitamin (MULTIVITAMIN WITH MINERALS) TABS tablet Take 1 tablet by mouth in the morning.     OneTouch Delica Lancets 33G MISC Please use to check blood sugar up to 4 times daily. E11.42 100 each 12   Oyster Shell (OYSTER CALCIUM) 500 MG TABS tablet Take 500 mg of elemental calcium by mouth daily in the afternoon.     pantoprazole (PROTONIX) 40 MG tablet TAKE 1 TABLET EVERY DAY 90 tablet 3   potassium chloride SA (KLOR-CON M) 20 MEQ tablet TAKE 1 TABLET AS NEEDED (TAKE AS DIRECTED WITH LASIX AS NEEDED FOR EDEMA) 90 tablet 3   Semaglutide, 2 MG/DOSE, (OZEMPIC, 2 MG/DOSE,) 8 MG/3ML SOPN Inject 2 mg into the skin once a week. 9 mL 3   traZODone (DESYREL) 50 MG tablet TAKE 1/2 TABLET BY MOUTH AT BEDTIME AS NEEDED FOR SLEEP. 45 tablet 1   TRUE METRIX BLOOD GLUCOSE TEST test strip TEST BLOOD SUGAR AS INSTRUCTED 350 strip 3   No current facility-administered medications for this visit.     Allergies has No Known Allergies.  Family History - reviewed per EMR and intake form  Social History   reports no history of alcohol use.  reports that she quit smoking about 56 years ago. Her smoking use included cigarettes. She started smoking about 59 years ago. She has a 3 pack-year smoking history. She has been exposed to tobacco smoke. She has never used smokeless tobacco.  reports that she does not currently use drugs after having used the following drugs: Cocaine and Marijuana.   EXAM: Vitals: BP 130/70   Ht 5\' 1"  (1.549 m)   Wt 178 lb (80.7 kg)   BMI 33.63 kg/m  General: AOx3, NAD, pleasant SKIN: no rashes or lesions, skin clean, dry, intact MSK: C-spine: Decreased range of motion in all planes with associated pain, worse discomfort with rotation.  She has no midline tenderness.  She has bilateral paraspinal tenderness extending into her upper back and trapezius.  She has normal grip strength bilaterally.  Normal shoulder strength bilaterally.  NEURO: sensation intact to light touch upper extremity bilaterally, DTR + 2/4 bicep, tricep, brachioradialis bilaterally VASC: pulses 2+ and symmetric radial artery bilaterally, no edema  IMAGING: MRI: MRI C-spine without contrast 07/31/2023 personally reviewed and interpreted by me today and compared to previous 04/14/2019 showing: -Progressive degenerative disc disease and facet arthropathy at C5-6.  Mild spinal stenosis and moderate bilateral foraminal stenosis. -Has small left paracentral disc protrusion with mass effect on the left thecal sac and contributing to medial foraminal stenosis. -Slightly progressive mild spinal and mild-moderate bilateral foraminal stenosis at C6-7 -His findings are progressive and worsening when compared to previous imaging in 2020  Assessment & Plan Herniation of cervical intervertebral disc with radiculopathy Acute on chronic neck pain with associated radiculopathy that has been worsening over  the past several months, now affecting her daily activities and quality of life.  This is progressive when comparing previous MRI from 2022 most recent MRI September 2024  Plan: 1.  Previous notes from visits with PCP at family medicine Center were reviewed today in detail. 2.  Imaging studies including MRI cervical spine 04/14/2019 and 07/31/2023 reviewed in detail  by myself today and personally interpreted.  Images were reviewed with patient in the office today.  Findings as noted above. 3.  Discussed treatment options including: Watchful waiting versus physical therapy versus consideration of epidural injections versus referral to spine surgeon.  Patient would like to see spine surgeon, referral was placed.  They would be able to discuss need for epidurals versus surgery.  She is not interested in PT due to transportation and financial constraints for this. 4.  She will continue her gabapentin 600 mg daily as she is doing.  Could consider increasing this in the future if needed 5.  She will also continue to take her Cymbalta 60 mg p.o. daily. 6.  She will follow-up with me on an as-needed basis, will follow-up with spine surgeon for further evaluation and treatment. 7.  Patient expressed understanding agreement with above.  All questions were answered.    Encounter Diagnosis  Name Primary?   Herniation of cervical intervertebral disc with radiculopathy Yes    Orders Placed This Encounter  Procedures   Ambulatory referral to Orthopedic Surgery    Orders Placed This Encounter  Procedures   Ambulatory referral to Orthopedic Surgery

## 2023-09-04 ENCOUNTER — Telehealth: Payer: Self-pay | Admitting: *Deleted

## 2023-09-04 NOTE — Telephone Encounter (Signed)
Telephone encounter was:  Unsuccessful.  09/04/2023 Name: AYLANI KRUPICKA MRN: [id number] DOB: Nov 01, 1956  Unsuccessful outbound call made today to assist with:  Transportation Needs   Outreach Attempt:  1st Attempt  A HIPAA compliant voice message was left requesting a return call.  Instructed patient to call back at [phone].  [name] [facility]  Population Health Careguide  Direct Dial: [phone] Website: [email or web address]

## 2023-09-09 ENCOUNTER — Telehealth: Payer: Self-pay

## 2023-09-09 MED ORDER — BREZTRI AEROSPHERE 160-9-4.8 MCG/ACT IN AERO: 2.0000 | INHALATION_SPRAY | Freq: Two times a day (BID) | RESPIRATORY_TRACT | Status: DC

## 2023-09-09 NOTE — Addendum Note (Signed)
Addended by: Kathrin Ruddy on: 09/09/2023 04:07 PM   Modules accepted: Orders

## 2023-09-09 NOTE — Telephone Encounter (Signed)
Reviewed and agree with Dr Koval's plan.   

## 2023-09-09 NOTE — Telephone Encounter (Signed)
Patient contacted for follow-up of need for inhaler therapy.   Since last contact patient reports she has not yet received Trelegy.  She has heard of Breztri.  Patient educated on purpose, proper use and potential adverse effects.   Following instruction patient verbalized understanding of treatment plan.   Plan to reevaluate long-term plan if Trelegy MAP medication supply becomes available.   Reassess control in 2 weeks.   Current Medications include: Breztri 2 inhalations twice daily.    Total time with patient call and documentation of interaction: 12 minutes.

## 2023-09-09 NOTE — Telephone Encounter (Signed)
Submitted re-enrollment application for OZEMPIC, TRESIBA, & FIASP to [employer] for patient assistance via online portal.   Phone: [phone]

## 2023-09-09 NOTE — Assessment & Plan Note (Signed)
Patient contacted for follow-up of need for inhaler therapy.   Since last contact patient reports she has not yet received Trelegy.  She has heard of Breztri.  Patient educated on purpose, proper use and potential adverse effects.   Following instruction patient verbalized understanding of treatment plan.   Plan to reevaluate long-term plan if Trelegy MAP medication supply becomes available.

## 2023-09-10 ENCOUNTER — Telehealth: Payer: Self-pay | Admitting: Student

## 2023-09-10 ENCOUNTER — Telehealth: Payer: Self-pay | Admitting: *Deleted

## 2023-09-10 NOTE — Telephone Encounter (Signed)
Telephone encounter was:  Successful.  09/10/2023 Name: [name] MRN: [id number] DOB: 09-20-1956  Cassandra Reid is a 67 y.o. year old female who is a primary care patient of Glendale Chard, DO . The community resource team was consulted for assistance with Transportation Needs  Talked with patient about changes in the transportation program and asked if she required any other assitance and she said not at this time  Care guide performed the following interventions: Patient provided with information about care guide support team and interviewed to confirm resource needs.  Follow Up Plan:  No further follow up planned at this time. The patient has been provided with needed resources.  [name] [facility]  Population Health Careguide  Direct Dial: [phone] Website: [email or web address]

## 2023-09-10 NOTE — Telephone Encounter (Signed)
Reviewed disability form. Placed in PCP box to be completed. Drusilla Kanner, CMA

## 2023-09-10 NOTE — Telephone Encounter (Signed)
Patient dropped off statement of disability form to be completed. Last DOS was 07/02/23. Placed in [employer].

## 2023-09-11 ENCOUNTER — Telehealth: Payer: Self-pay

## 2023-09-11 NOTE — Telephone Encounter (Signed)
Completed az&me patient pages for Farxiga RE-ENROLLMENT, and NEW enrollment for [facility].  Placed app in pcp box for signatures.

## 2023-09-12 ENCOUNTER — Other Ambulatory Visit: Payer: Self-pay | Admitting: Medical Genetics

## 2023-09-12 ENCOUNTER — Ambulatory Visit: Payer: Medicare PPO | Admitting: Nurse Practitioner

## 2023-09-12 ENCOUNTER — Encounter: Payer: Self-pay | Admitting: Nurse Practitioner

## 2023-09-12 VITALS — BP 110/50 | HR 75 | Ht 61.0 in | Wt 179.2 lb

## 2023-09-12 DIAGNOSIS — Z006 Encounter for examination for normal comparison and control in clinical research program: Secondary | ICD-10-CM

## 2023-09-12 DIAGNOSIS — R142 Eructation: Secondary | ICD-10-CM

## 2023-09-12 DIAGNOSIS — K5900 Constipation, unspecified: Secondary | ICD-10-CM

## 2023-09-12 NOTE — Telephone Encounter (Signed)
Disability form placed up front for pick up.   Copy made for batch scanning.   Patient has been made aware.

## 2023-09-12 NOTE — Patient Instructions (Addendum)
Miralax- 1 capful mixed in 8 ounces of water every night  Dulcolax tablets (over the counter)- take 1 to 2 tablets by mouth every 3rd night as needed.  Contact Colleen,NP with an update in 2-3 weeks.   Due to recent changes in healthcare laws, you may see the results of your imaging and laboratory studies on MyChart before your provider has had a chance to review them.  We understand that in some cases there may be results that are confusing or concerning to you. Not all laboratory results come back in the same time frame and the provider may be waiting for multiple results in order to interpret others.  Please give [place] 48 hours in order for your provider to thoroughly review all the results before contacting the office for clarification of your results.   Thank you for trusting me with your gastrointestinal care!   Alcide Evener, CRNP

## 2023-09-12 NOTE — Progress Notes (Signed)
09/12/2023 Cassandra Reid [id number] 1956-09-20   CHIEF COMPLAINT: Constipation, bloating and burping  HISTORY OF PRESENT ILLNESS: Cassandra Reid is a 67 year old female with a past medical history of anxiety, depression, fibromyalgia, hypertension, DM type II, CKD, legally blind, cerebral aneurysm s/p surgical repair, aneurysm, secondary polycythemia, sleep apnea nonadherent with CPAP, GERD and colon polyps. Past cholecystectomy, appendectomy, sinus surgery, tubal ligation and hysterectomy.  She presents to our office today self-referred for further evaluation regarding constipation, abdominal bloat and excessive burping. She describes having rockhard pellet-like stools every other day for the past 6 months with associated abdominal gas bloat. She sometimes notices more discomfort to the LUQ area which sometimes feels like spasms and has occurred intermittently for the past few years. No specific food or stress triggers. She took MiraLAX once or twice and Dulcolax 3 tabs on a few occasions without significant results. She has been on Ozempic for the past year. She has history of GERD for which she takes Pantoprazole 40 mg in the morning and Famotidine mid day.  She endorses having constant belches, feels like gas comes from her feet and comes out her mouth. She has frequent heartburn with acid in her throat most days for the past year. No dysphagia.  She underwent an EGD and colonoscopy 11/24/2020 by digestive health specialists and the EGD showed erosive gastritis and duodenitis, pathology report not found in care everywhere. A colonoscopy was aborted secondary to stool in the colon.  She underwent a repeat EGD 04/18/2021 which showed gastritis and a small hiatal hernia. Gastric biopsies were negative for H. pylori. She underwent a repeat colonoscopy 01/02/2021 which identified two 2 to 5 mm polyps were removed from the colon and mild diverticulosis of the sigmoid colon was noted. Colonoscopy path  report not found in care everywhere.     Latest Ref Rng & Units 08/08/2023    8:51 AM 05/20/2023   11:21 AM 03/18/2023    5:15 PM  CBC  WBC 4.0 - 10.5 K/uL 7.7  7.5  6.5   Hemoglobin 12.0 - 15.0 g/dL 40.9  81.1  91.4   Hematocrit 36.0 - 46.0 % 47.9  52.1  52.0   Platelets 150 - 400 K/uL 176  189          Latest Ref Rng & Units 08/08/2023    8:51 AM 03/18/2023    5:15 PM 02/12/2023    4:24 PM  CMP  Glucose 70 - 99 mg/dL 782  98  956   BUN 8 - 23 mg/dL 18  13  17    Creatinine 0.44 - 1.00 mg/dL 2.13  0.86  5.78   Sodium 135 - 145 mmol/L 138  141  139   Potassium 3.5 - 5.1 mmol/L 4.1  4.2  3.9   Chloride 98 - 111 mmol/L 106  104  104   CO2 22 - 32 mmol/L 24  18  24    Calcium 8.9 - 10.3 mg/dL 9.5  46.9  62.9   Total Protein 6.5 - 8.1 g/dL 6.8  6.9    Total Bilirubin 0.3 - 1.2 mg/dL 0.7  0.4    Alkaline Phos 38 - 126 U/L 83  105    AST 15 - 41 U/L 23  40    ALT 0 - 44 U/L 37  70      PAST GI PROCEDURES:   Colonoscopy 01/02/2021 by digestive disease specialists: 2 mm polyp in the ascending colon 5 mm polyp  in the hepatic flexure Mild diverticulosis of the sigmoid colon Stool in the colon Note, copious amount of adherent stool noted in the right colon, extensive lavage performed with fair visualization  EGD 04/18/2021: Inlet patch at 17 cm Gastritis, biopsied Small hiatal hernia Normal duodenum Path report not accessible in care everywhere    EGD 11/24/2020: Normal esophagus  Erosive gastritis, bx obtained Duodenitis, bx obtained   Colonoscopy 11/24/2020: Aborted secondary to stool in the colon  Past Medical History:  Diagnosis Date   Cerebral aneurysm without rupture    CKD stage 3 due to type 2 diabetes mellitus 04/15/2019   DDD (degenerative disc disease), cervical 04/16/2019   Encephalomalacia on imaging study 08/25/2020   Brain MRI 08/25/21: Few scattered areas of encephalomalacia and gliosis within the overlying left frontal lobe most likely postoperative in nature.    Fatty liver 05/31/2020   Generalized anxiety disorder    GERD (gastroesophageal reflux disease)    Hyperlipidemia associated with type 2 diabetes mellitus 07/18/2018   Hypertension associated with diabetes 07/18/2018   Incontinence 09/22/2021   Bowel and bladder.    Legally blind    Major depressive disorder    Osteoporosis 01/18/2021   Paroxysmal atrial tachycardia (HCC)    On telemetry   Type 2 diabetes mellitus with diabetic polyneuropathy, with long-term current use of insulin 07/18/2018   has dexcom G7  - sensor left upper arm   Past Surgical History:  Procedure Laterality Date   ABDOMINAL HYSTERECTOMY  2012   APPENDECTOMY  1972   CEREBRAL ANEURYSM REPAIR Left 2004   Aneurysm clip in place near the level of the left ICA on Brain MRI 08/2020. Also coiling performed   CHOLECYSTECTOMY  2015   COLONOSCOPY  11/24/2020   in [place]   CRANIOTOMY Left    left pterional craniotomy   ETHMOIDECTOMY Left 11/23/2022   Procedure: LEFT ANTERIOR ETHMOIDECTOMY;  Surgeon: Laren Boom, DO;  Location: [facility] OR;  Service: ENT;  Laterality: Left;   EYE SURGERY  2023   MAXILLARY ANTROSTOMY Left 11/23/2022   Procedure: MAXILLARY ANTROSTOMY;  Surgeon: Laren Boom, DO;  Location: [facility] OR;  Service: ENT;  Laterality: Left;   MENISCUS REPAIR Bilateral    NASAL TURBINATE REDUCTION Bilateral 11/23/2022   Procedure: Bilateral turbinate reduction;  Surgeon: Laren Boom, DO;  Location: [facility] OR;  Service: ENT;  Laterality: Bilateral;   SINUS ENDO W/FUSION Left 11/23/2022   Procedure: Functional endoscopic sinus surgery with navigation.;  Surgeon: Laren Boom, DO;  Location: [facility] OR;  Service: ENT;  Laterality: Left;   TUBAL LIGATION  1990   UPPER GI ENDOSCOPY  11/24/2020   in [place]   Social History: She is divorced.  She has 1 son and 2 daughters.  She quit smoking about 56 years ago.  Remote history of cocaine and marijuana use.  Family History: No known family history of esophageal,  gastric or colon cancer.  Sister had breast cancer.  Maternal and paternal parents had cancer, further details unknown.  Mother with history of alcohol use disorder, hypertension, kidney disease and diabetes.  Father with history of hypertension, heart disease and COPD.   No Known Allergies    Outpatient Encounter Medications as of 09/12/2023  Medication Sig   albuterol (VENTOLIN HFA) 108 (90 Base) MCG/ACT inhaler INHALE 2 PUFFS INTO THE LUNGS 2 (TWO) TIMES DAILY. PLUS 2 PUFFS AS NEEDED   amLODipine (NORVASC) 5 MG tablet TAKE 1 TABLET EVERY DAY   Ascorbic Acid (VITAMIN C  ADULT GUMMIES PO) Take 1 each by mouth daily in the afternoon.   atorvastatin (LIPITOR) 40 MG tablet Take 40 mg by mouth daily.   BD PEN NEEDLE NANO 2ND GEN 32G X 4 MM MISC USE WITH INSULIN   blood glucose meter kit and supplies Dispense based on patient and insurance preference. Use up to four times daily as directed. (FOR ICD-10 E10.9, E11.9).   Budeson-Glycopyrrol-Formoterol (BREZTRI AEROSPHERE) 160-9-4.8 MCG/ACT AERO Inhale 2 puffs into the lungs in the morning and at bedtime.   Continuous Blood Gluc Sensor (DEXCOM G6 SENSOR) MISC Use as directed to check blood sugar.   Continuous Blood Gluc Transmit (DEXCOM G6 TRANSMITTER) MISC Use as directed to check blood sugar.   dapagliflozin propanediol (FARXIGA) 10 MG TABS tablet Take 1 tablet (10 mg total) by mouth daily.   DULoxetine (CYMBALTA) 60 MG capsule Take 1 capsule (60 mg total) by mouth daily.   ezetimibe (ZETIA) 10 MG tablet TAKE 1 TABLET EVERY DAY   famotidine (PEPCID) 20 MG tablet Take 1 tablet (20 mg total) by mouth daily.   FIBER SELECT GUMMIES PO Take 1 tablet by mouth daily in the afternoon.   fluticasone (FLONASE) 50 MCG/ACT nasal spray Place 1 spray into both nostrils daily as needed for allergies or rhinitis.   fluticasone furoate-vilanterol (BREO ELLIPTA) 200-25 MCG/ACT AEPB Inhale 1 puff into the lungs daily.   Fluticasone-Umeclidin-Vilant (TRELEGY ELLIPTA)  200-62.5-25 MCG/ACT AEPB Inhale 1 puff into the lungs daily.   furosemide (LASIX) 40 MG tablet Take 1 tablet (40 mg total) by mouth as needed for edema. (Patient taking differently: Take 40 mg by mouth daily as needed (ankle swelling.).)   gabapentin (NEURONTIN) 600 MG tablet TAKE 0.5-1 TABLET (300-600 MG TOTAL) BY MOUTH 3 (THREE) TIMES DAILY. (Patient taking differently: Take 600 mg by mouth daily.)   insulin aspart (FIASP) 100 UNIT/ML FlexTouch Pen Inject 14-16 Units into the skin daily with breakfast AND 12-14 Units daily with lunch AND 10-12 Units daily with supper. This is a patient assistance medication. Patient may not be approved and/or have medication. Please ask and verify when performing med review..   insulin degludec (TRESIBA) 100 UNIT/ML FlexTouch Pen Inject 50 Units into the skin daily. This is a patient assistance medication. Patient may not be approved and/or have medication. Please ask and verify when performing med review.   lidocaine (LIDODERM) 5 % Place 1 patch onto the skin daily. Remove & Discard patch within 12 hours or as directed by MD   lisinopril (ZESTRIL) 40 MG tablet TAKE 1 TABLET EVERY DAY   loratadine (CLARITIN) 10 MG tablet TAKE 1 TABLET EVERY DAY   LORazepam (ATIVAN) 0.5 MG tablet TAKE 1 TABLET EVERY 8 HOURS AS NEEDED FOR ANXIETY   Magnesium 500 MG TABS Take 500 mg by mouth daily in the afternoon.   metoprolol succinate (TOPROL-XL) 50 MG 24 hr tablet Take 1 tablet (50 mg total) by mouth daily. Take with or immediately following a meal.   Multiple Vitamin (MULTIVITAMIN WITH MINERALS) TABS tablet Take 1 tablet by mouth in the morning.   OneTouch Delica Lancets 33G MISC Please use to check blood sugar up to 4 times daily. E11.42   Oyster Shell (OYSTER CALCIUM) 500 MG TABS tablet Take 500 mg of elemental calcium by mouth daily in the afternoon.   pantoprazole (PROTONIX) 40 MG tablet TAKE 1 TABLET EVERY DAY   potassium chloride SA (KLOR-CON M) 20 MEQ tablet TAKE 1 TABLET  AS NEEDED (TAKE AS DIRECTED WITH LASIX  AS NEEDED FOR EDEMA)   Semaglutide, 2 MG/DOSE, (OZEMPIC, 2 MG/DOSE,) 8 MG/3ML SOPN Inject 2 mg into the skin once a week.   traZODone (DESYREL) 50 MG tablet TAKE 1/2 TABLET BY MOUTH AT BEDTIME AS NEEDED FOR SLEEP.   TRUE METRIX BLOOD GLUCOSE TEST test strip TEST BLOOD SUGAR AS INSTRUCTED   [DISCONTINUED] magic mouthwash (nystatin, diphenhydrAMINE, alum & mag hydroxide) suspension mixture Swish and spit 5 mLs 3 (three) times daily.   No facility-administered encounter medications on file as of 09/12/2023.   REVIEW OF SYSTEMS:  Gen: + Night sweats. No unintentional weight loss. CV: Denies chest pain, palpitations or edema. Resp: Denies cough, shortness of breath of hemoptysis.  GI: See HPI. GU: + Urinary leakage.  MS: + Back pain .  Derm: Denies rash, itchiness, skin lesions or unhealing ulcers. Psych: Denies depression, anxiety, memory loss or confusion. Heme: Denies bruising, easy bleeding. Neuro:  Denies headaches, dizziness or paresthesias. Endo:  Denies any problems with DM, thyroid or adrenal function.  PHYSICAL EXAM: BP (!) 110/50 (BP Location: Right Arm, Patient Position: Sitting, Cuff Size: Large)   Pulse 75   Ht 5\' 1"  (1.549 m)   Wt 179 lb 4 oz (81.3 kg)   SpO2 95%   BMI 33.87 kg/m  General: 67 year old female in no acute distress. Head: Left head scar intact.  Facial rosacea. Eyes:  Sclerae non-icteric, conjunctive pink. Ears: Normal auditory acuity. Mouth: Dentition intact. No ulcers or lesions.  Neck: Supple, no lymphadenopathy or thyromegaly.  Lungs: Clear bilaterally to auscultation without wheezes, crackles or rhonchi. Heart: Regular rate and rhythm. No murmur, rub or gallop appreciated.  Abdomen: Soft, nondistended.  Mild generalized abdominal tenderness without rebound or guarding.  No masses. No hepatosplenomegaly. Normoactive bowel sounds x 4 quadrants.  Rectal: Deferred.  Musculoskeletal: Symmetrical with no gross  deformities. Skin: Warm and dry. No rash or lesions on visible extremities. Extremities: No edema. Neurological: Alert oriented x 4, no focal deficits.  Psychological:  Alert and cooperative. Normal mood and affect.  ASSESSMENT AND PLAN:  67 year old female with chronic constipation with associated abdominal bloat x 6 months  -MiraLAX nightly -Dulcolax 1-2 tabs every third night as needed -Drink 64 ounces of water daily -Fiber diet as tolerated -Consider trial with Linzess if no improvement with the above regimen -Patient to contact me in 2 to 3 weeks with an update  GERD, active acid reflux and significant burping x 1 year.  Her most recent EGD 04/18/2021 showed evidence of gastritis and a small hiatal hernia without evidence of H. pylori. -Continue Pantoprazole 40 mg every morning.  Increase Famotidine 20 mg p.o. to twice daily.  Intermittent chronic LUQ pain/spasms -Patient to monitor symptoms and will consider trial with IBgard if LUQ pain/spasm type pain recurs -Defer Dicyclomine or Hyoscyamine use secondary to potential side effects including dizziness, lightheadedness in a patient who previously underwent brain aneurysm surgery and has visual impairment   History of colon polyps.  Colonoscopy 01/02/2021 by digestive health specialists identified 2 small polyps removed from the colon, pathology report not found in care everywhere. -Request copy of 01/02/2021 colonoscopy biopsy results. To verify recall colonoscopy date after results reviewed  DM type II on Ozempic   Secondary polycythemia followed by hematologist Dr. Pamelia Hoit, goal phlebotomy hematocrit < 45   CC:  Valetta Close, MD

## 2023-09-13 NOTE — Progress Notes (Signed)
____________________________________________________________  Attending physician addendum:  Thank you for sending this case to me. I have reviewed the entire note and agree with the plan.  Seems most likely to be functional/motility related constipation with perhaps side effect of GLP-1 agonist contributing.  Amada Jupiter, MD  ____________________________________________________________

## 2023-09-17 NOTE — Telephone Encounter (Signed)
Received notification from [employer] regarding approval for OZEMPIC, FIASP, AND TRESIBA. Patient assistance approved from 09/15/24 to 11/18/24.  Medication will ship to [facility]  Pt ID: [id number]  Company phone: [phone]

## 2023-09-17 NOTE — Telephone Encounter (Signed)
Submitted RENEWAL/NEW application for FARXIGA & BREZTRI to [place] for patient assistance.   Phone: [phone]

## 2023-09-17 NOTE — Progress Notes (Signed)
Contacted [facility] & spoke with Mindi Junker from medical records & Mindi Junker will be sending over the records shortly.

## 2023-09-18 ENCOUNTER — Other Ambulatory Visit: Payer: Self-pay | Admitting: Family Medicine

## 2023-09-18 DIAGNOSIS — E1142 Type 2 diabetes mellitus with diabetic polyneuropathy: Secondary | ICD-10-CM

## 2023-09-19 MED ORDER — BLOOD GLUCOSE METER KIT: PACK | 0 refills | Status: AC

## 2023-09-19 NOTE — Telephone Encounter (Signed)
SHREDDED [employer] APPLICATION.   MED CHANGED TO BREZTRI.

## 2023-09-23 ENCOUNTER — Other Ambulatory Visit (HOSPITAL_COMMUNITY)
Admission: RE | Admit: 2023-09-23 | Discharge: 2023-09-23 | Disposition: A | Discharge status: Home / Self Care | Payer: Medicare PPO | Source: Ambulatory Visit | Attending: *Deleted | Admitting: *Deleted

## 2023-09-23 DIAGNOSIS — E1142 Type 2 diabetes mellitus with diabetic polyneuropathy: Secondary | ICD-10-CM | POA: Diagnosis not present

## 2023-09-23 DIAGNOSIS — Z006 Encounter for examination for normal comparison and control in clinical research program: Secondary | ICD-10-CM

## 2023-09-23 NOTE — Telephone Encounter (Signed)
Received notification from [place] regarding approval for [facility] & BREZTRI. Patient assistance approved from 09/19/23 to 11/18/24.  Medication will ship to patients home.  Pt ID: [id number]  Company phone: [phone]

## 2023-09-24 ENCOUNTER — Encounter: Payer: Self-pay | Admitting: *Deleted

## 2023-09-25 ENCOUNTER — Other Ambulatory Visit: Payer: Self-pay | Admitting: *Deleted

## 2023-09-25 DIAGNOSIS — D751 Secondary polycythemia: Secondary | ICD-10-CM

## 2023-09-26 ENCOUNTER — Inpatient Hospital Stay: Payer: Medicare PPO | Attending: Hematology and Oncology

## 2023-09-26 ENCOUNTER — Inpatient Hospital Stay (HOSPITAL_BASED_OUTPATIENT_CLINIC_OR_DEPARTMENT_OTHER): Payer: Medicare PPO | Admitting: Hematology and Oncology

## 2023-09-26 ENCOUNTER — Inpatient Hospital Stay: Payer: Medicare PPO

## 2023-09-26 VITALS — BP 150/67 | HR 88 | Temp 97.8°F | Resp 18 | Ht 61.0 in | Wt 180.1 lb

## 2023-09-26 DIAGNOSIS — R5383 Other fatigue: Secondary | ICD-10-CM | POA: Diagnosis not present

## 2023-09-26 DIAGNOSIS — D751 Secondary polycythemia: Secondary | ICD-10-CM

## 2023-09-26 DIAGNOSIS — R232 Flushing: Secondary | ICD-10-CM | POA: Insufficient documentation

## 2023-09-26 DIAGNOSIS — R0602 Shortness of breath: Secondary | ICD-10-CM | POA: Insufficient documentation

## 2023-09-26 DIAGNOSIS — Z79899 Other long term (current) drug therapy: Secondary | ICD-10-CM | POA: Insufficient documentation

## 2023-09-26 LAB — CBC WITH DIFFERENTIAL (CANCER CENTER ONLY)
Abs Immature Granulocytes: 0.02 10*3/uL (ref 0.00–0.07)
Basophils Absolute: 0.1 10*3/uL (ref 0.0–0.1)
Basophils Relative: 1 %
Eosinophils Absolute: 0.1 10*3/uL (ref 0.0–0.5)
Eosinophils Relative: 1 %
HCT: 44.8 % (ref 36.0–46.0)
Hemoglobin: 14.8 g/dL (ref 12.0–15.0)
Immature Granulocytes: 0 %
Lymphocytes Relative: 14 %
Lymphs Abs: 1.2 10*3/uL (ref 0.7–4.0)
MCH: 28.4 pg (ref 26.0–34.0)
MCHC: 33 g/dL (ref 30.0–36.0)
MCV: 85.8 fL (ref 80.0–100.0)
Monocytes Absolute: 0.5 10*3/uL (ref 0.1–1.0)
Monocytes Relative: 6 %
Neutro Abs: 6.4 10*3/uL (ref 1.7–7.7)
Neutrophils Relative %: 78 %
Platelet Count: 160 10*3/uL (ref 150–400)
RBC: 5.22 MIL/uL — ABNORMAL HIGH (ref 3.87–5.11)
RDW: 13.5 % (ref 11.5–15.5)
WBC Count: 8.2 10*3/uL (ref 4.0–10.5)
nRBC: 0 % (ref 0.0–0.2)

## 2023-09-26 LAB — CMP (CANCER CENTER ONLY)
ALT: 31 U/L (ref 0–44)
AST: 23 U/L (ref 15–41)
Albumin: 4.3 g/dL (ref 3.5–5.0)
Alkaline Phosphatase: 88 U/L (ref 38–126)
Anion gap: 8 (ref 5–15)
BUN: 18 mg/dL (ref 8–23)
CO2: 25 mmol/L (ref 22–32)
Calcium: 9.8 mg/dL (ref 8.9–10.3)
Chloride: 105 mmol/L (ref 98–111)
Creatinine: 1.07 mg/dL — ABNORMAL HIGH (ref 0.44–1.00)
GFR, Estimated: 57 mL/min — ABNORMAL LOW (ref >60)
Glucose, Bld: 193 mg/dL — ABNORMAL HIGH (ref 70–99)
Potassium: 4.1 mmol/L (ref 3.5–5.1)
Sodium: 138 mmol/L (ref 135–145)
Total Bilirubin: 0.5 mg/dL (ref <1.2)
Total Protein: 6.9 g/dL (ref 6.5–8.1)

## 2023-09-26 NOTE — Progress Notes (Signed)
Patient Care Team: Glendale Chard, DO as PCP - General (Family Medicine) Jens Som Madolyn Frieze, MD as PCP - Cardiology (Cardiology) [facility], Konrad Dolores, MD as Consulting Physician (Endocrinology) Drema Dallas, DO as Consulting Physician (Neurology) Tomma Lightning, MD as Consulting Physician (Pulmonary Disease)  DIAGNOSIS:  Encounter Diagnosis  Name Primary?   Polycythemia, secondary Yes    CHIEF COMPLIANT: Polycythemia F/U  HISTORY OF PRESENT ILLNESS: Discussed the use of AI scribe software for clinical note transcription with the patient, who gave verbal consent to proceed.  History of Present Illness   Cassandra Reid, a patient with a history of polycythemia, presents with ongoing fatigue and facial flushing. She reports feeling 'lousy' again, similar to her condition prior to a therapeutic phlebotomy performed two months ago. Following the procedure, she experienced a brief period of improved energy levels lasting approximately one to two weeks, but has since returned to her baseline of fatigue. She also notes persistent facial flushing, which she has attempted to manage with makeup. She expresses uncertainty about whether these symptoms are related to her polycythemia or are simply a result of aging. She also reports experiencing shortness of breath, particularly when climbing stairs.        ALLERGIES:  has No Known Allergies.  MEDICATIONS:  Current Outpatient Medications  Medication Sig Dispense Refill   albuterol (VENTOLIN HFA) 108 (90 Base) MCG/ACT inhaler INHALE 2 PUFFS INTO THE LUNGS 2 (TWO) TIMES DAILY. PLUS 2 PUFFS AS NEEDED 18 each 1   amLODipine (NORVASC) 5 MG tablet TAKE 1 TABLET EVERY DAY 90 tablet 3   Ascorbic Acid (VITAMIN C ADULT GUMMIES PO) Take 1 each by mouth daily in the afternoon.     atorvastatin (LIPITOR) 40 MG tablet Take 40 mg by mouth daily.     BD PEN NEEDLE NANO 2ND GEN 32G X 4 MM MISC USE WITH INSULIN 100 each 4   blood glucose meter kit and  supplies Dispense based on patient and insurance preference. Use up to four times daily as directed. (FOR ICD-10 E10.9, E11.9). 1 each 0   Budeson-Glycopyrrol-Formoterol (BREZTRI AEROSPHERE) 160-9-4.8 MCG/ACT AERO Inhale 2 puffs into the lungs in the morning and at bedtime.     Continuous Blood Gluc Sensor (DEXCOM G6 SENSOR) MISC Use as directed to check blood sugar. 4 each 11   Continuous Blood Gluc Transmit (DEXCOM G6 TRANSMITTER) MISC Use as directed to check blood sugar. 1 each 12   dapagliflozin propanediol (FARXIGA) 10 MG TABS tablet Take 1 tablet (10 mg total) by mouth daily. 90 tablet 3   DULoxetine (CYMBALTA) 60 MG capsule Take 1 capsule (60 mg total) by mouth daily. 90 capsule 3   ezetimibe (ZETIA) 10 MG tablet TAKE 1 TABLET EVERY DAY 90 tablet 3   famotidine (PEPCID) 20 MG tablet Take 1 tablet (20 mg total) by mouth daily. 90 tablet 3   FIBER SELECT GUMMIES PO Take 1 tablet by mouth daily in the afternoon.     fluticasone (FLONASE) 50 MCG/ACT nasal spray Place 1 spray into both nostrils daily as needed for allergies or rhinitis. 9.9 mL 1   fluticasone furoate-vilanterol (BREO ELLIPTA) 200-25 MCG/ACT AEPB Inhale 1 puff into the lungs daily. 60 each 3   Fluticasone-Umeclidin-Vilant (TRELEGY ELLIPTA) 200-62.5-25 MCG/ACT AEPB Inhale 1 puff into the lungs daily.     furosemide (LASIX) 40 MG tablet Take 1 tablet (40 mg total) by mouth as needed for edema. (Patient taking differently: Take 40 mg by mouth daily as needed (ankle swelling.).)  90 tablet 3   gabapentin (NEURONTIN) 600 MG tablet TAKE 0.5-1 TABLET (300-600 MG TOTAL) BY MOUTH 3 (THREE) TIMES DAILY. (Patient taking differently: Take 600 mg by mouth daily.) 270 tablet 3   insulin aspart (FIASP) 100 UNIT/ML FlexTouch Pen Inject 14-16 Units into the skin daily with breakfast AND 12-14 Units daily with lunch AND 10-12 Units daily with supper. This is a patient assistance medication. Patient may not be approved and/or have medication. Please ask  and verify when performing med review.. 15 mL 3   insulin degludec (TRESIBA) 100 UNIT/ML FlexTouch Pen Inject 50 Units into the skin daily. This is a patient assistance medication. Patient may not be approved and/or have medication. Please ask and verify when performing med review. 15 mL 3   lidocaine (LIDODERM) 5 % Place 1 patch onto the skin daily. Remove & Discard patch within 12 hours or as directed by MD 30 patch 0   lisinopril (ZESTRIL) 40 MG tablet TAKE 1 TABLET EVERY DAY 90 tablet 3   loratadine (CLARITIN) 10 MG tablet TAKE 1 TABLET EVERY DAY 90 tablet 3   LORazepam (ATIVAN) 0.5 MG tablet TAKE 1 TABLET EVERY 8 HOURS AS NEEDED FOR ANXIETY 90 tablet 0   Magnesium 500 MG TABS Take 500 mg by mouth daily in the afternoon.     metoprolol succinate (TOPROL-XL) 50 MG 24 hr tablet Take 1 tablet (50 mg total) by mouth daily. Take with or immediately following a meal. 90 tablet 3   Multiple Vitamin (MULTIVITAMIN WITH MINERALS) TABS tablet Take 1 tablet by mouth in the morning.     OneTouch Delica Lancets 33G MISC Please use to check blood sugar up to 4 times daily. E11.42 100 each 12   Oyster Shell (OYSTER CALCIUM) 500 MG TABS tablet Take 500 mg of elemental calcium by mouth daily in the afternoon.     pantoprazole (PROTONIX) 40 MG tablet TAKE 1 TABLET EVERY DAY 90 tablet 3   potassium chloride SA (KLOR-CON M) 20 MEQ tablet TAKE 1 TABLET AS NEEDED (TAKE AS DIRECTED WITH LASIX AS NEEDED FOR EDEMA) 90 tablet 3   Semaglutide, 2 MG/DOSE, (OZEMPIC, 2 MG/DOSE,) 8 MG/3ML SOPN Inject 2 mg into the skin once a week. 9 mL 3   traZODone (DESYREL) 50 MG tablet TAKE 1/2 TABLET BY MOUTH AT BEDTIME AS NEEDED FOR SLEEP. 45 tablet 1   TRUE METRIX BLOOD GLUCOSE TEST test strip TEST BLOOD SUGAR AS INSTRUCTED 350 strip 3   No current facility-administered medications for this visit.    PHYSICAL EXAMINATION: ECOG PERFORMANCE STATUS: 1 - Symptomatic but completely ambulatory  Vitals:   09/26/23 1358  BP: (!) 150/67   Pulse: 88  Resp: 18  Temp: 97.8 F (36.6 C)  SpO2: 100%   Filed Weights   09/26/23 1358  Weight: 180 lb 1.6 oz (81.7 kg)     LABORATORY DATA:  I have reviewed the data as listed    Latest Ref Rng & Units 09/26/2023    1:41 PM 08/08/2023    8:51 AM 03/18/2023    5:15 PM  CMP  Glucose 70 - 99 mg/dL 161  096  98   BUN 8 - 23 mg/dL 18  18  13    Creatinine 0.44 - 1.00 mg/dL 0.45  4.09  8.11   Sodium 135 - 145 mmol/L 138  138  141   Potassium 3.5 - 5.1 mmol/L 4.1  4.1  4.2   Chloride 98 - 111 mmol/L 105  106  104   CO2 22 - 32 mmol/L 25  24  18    Calcium 8.9 - 10.3 mg/dL 9.8  9.5  78.2   Total Protein 6.5 - 8.1 g/dL 6.9  6.8  6.9   Total Bilirubin <1.2 mg/dL 0.5  0.7  0.4   Alkaline Phos 38 - 126 U/L 88  83  105   AST 15 - 41 U/L 23  23  40   ALT 0 - 44 U/L 31  37  70     Lab Results  Component Value Date   WBC 8.2 09/26/2023   HGB 14.8 09/26/2023   HCT 44.8 09/26/2023   MCV 85.8 09/26/2023   PLT 160 09/26/2023   NEUTROABS 6.4 09/26/2023    ASSESSMENT & PLAN:  Polycythemia, secondary Lab review 06/26/2022: Hemoglobin 15, hematocrit 47 03/18/2023: Erythropoietin: 13, JAK2 mutation: Negative 05/20/2023: Hemoglobin 17.1, hematocrit 52.1, WBC 7.5, platelets 189 08/08/2023: Hemoglobin 16.5, hematocrit 47.9 09/26/23: Hb 14.8, Hct: 44.8  the patient has secondary polycythemia.  Possibly related to her lung condition. Phlebotomy: 08/12/2023  Goal of phlebotomy: Hematocrit below 45 Return to clinic in 2 month with labs and follow-up   Orders Placed This Encounter  Procedures   CBC with Differential (Cancer Center Only)    Standing Status:   Future    Standing Expiration Date:   09/25/2024   The patient has a good understanding of the overall plan. she agrees with it. she will call with any problems that may develop before the next visit here. Total time spent: 30 mins including face to face time and time spent for planning, charting and co-ordination of care   Tamsen Meek, MD 09/26/23

## 2023-09-26 NOTE — Assessment & Plan Note (Signed)
Lab review 06/26/2022: Hemoglobin 15, hematocrit 47 03/18/2023: Erythropoietin: 13, JAK2 mutation: Negative 05/20/2023: Hemoglobin 17.1, hematocrit 52.1, WBC 7.5, platelets 189 08/08/2023: Hemoglobin 16.5, hematocrit 47.9  the patient has secondary polycythemia.  Possibly related to her lung condition. Phlebotomy: 08/12/2023  Goal of phlebotomy: Hematocrit below 45 Return to clinic in 1 month with labs and follow-up

## 2023-09-29 ENCOUNTER — Other Ambulatory Visit: Payer: Self-pay | Admitting: Family Medicine

## 2023-10-02 ENCOUNTER — Ambulatory Visit: Payer: Medicare PPO | Admitting: Orthopedic Surgery

## 2023-10-02 ENCOUNTER — Other Ambulatory Visit (INDEPENDENT_AMBULATORY_CARE_PROVIDER_SITE_OTHER): Payer: Self-pay

## 2023-10-02 ENCOUNTER — Encounter: Payer: Self-pay | Admitting: Orthopedic Surgery

## 2023-10-02 VITALS — BP 149/76 | HR 68 | Ht 61.0 in | Wt 180.5 lb

## 2023-10-02 DIAGNOSIS — M5030 Other cervical disc degeneration, unspecified cervical region: Secondary | ICD-10-CM

## 2023-10-02 NOTE — Progress Notes (Signed)
Orthopedic Spine Surgery Office Note  Assessment: Patient is a 67 y.o. female with neck pain that radiates into bilateral shoulder and arms to the level of the elbow. 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 Said if one of those treatments above worked, we could repeat that. Or, she has not tried PT and we could try that. Given her duration of symptoms and lack of relief with the above conservative treatments, also talked about surgery as a treatment option. After going over these options, patient wanted to proceed with surgery -Patient will next be seen at date of surgery   The patient has cervical radiculopathy, which was initially treated with non-operative measures. The patient's symptoms failed to improve with conservative treatments, so operative management was discussed in the form of C5-7 anterior cervical discectomy and fusion. The risks, including but not limited to pseudarthrosis, dysphagia, hematoma, airway compromise, recurrent laryngeal nerve injury, esophageal perforation, durotomy, spinal cord injury, nerve root injury, persistent pain, adjacent segment disease, infection, bleeding, hardware failure, vascular injury, heart attack, death, stroke, fracture, and need for additional procedures were discussed with the patient. The benefit of surgery would be improvement in the patient's radiating arm pain. Explained that the patient may not get full relief of her pain with this surgery, especially any neck pain. The alternatives to surgical management would be continued monitoring, physical therapy, over-the-counter pain medications, injections, traction, and activity modification. All the patient's questions were answered to her satisfaction. After this discussion, the patient expressed understanding and elected to proceed with  surgical intervention.     ___________________________________________________________________________   History:  Patient is a 67 y.o. female who presents today for cervical spine.  Patient has had 2 years of neck pain that radiates into the bilateral shoulders and lateral arms to the level of the elbow.  She said sometimes on the left side it radiates past the elbow over the dorsal aspect of the forearm.  It does not radiate past the elbow on the right side.  She does not have any pain radiating into her hands.  She will sometimes notice paresthesias especially on the left side in the same distribution as the pain.  There is no trauma or injury that preceded the onset of pain.  Pain has been getting progressively worse with time. She feels pain on a daily basis. She feels pain at rest and with activity.    Weakness: Yes, hands feel weaker at times.  No other weakness noted Difficulty with fine motor skills (e.g., buttoning shirts, handwriting): Denies Symptoms of imbalance: Denies Paresthesias and numbness: Yes, sometimes has paresthesias down the left arm in the same distribution as the pain.  No other numbness or paresthesias Bowel or bladder incontinence: Yes, has had 1 year of stress incontinence.  No recent changes in bowel or bladder habits.  No other history of incontinence. Saddle anesthesia: Denies  Treatments tried: ice/heat, lidocaine patches, gabapentin, duloxetine, tylenol, steroid injection  Review of systems: Denies fevers and chills, night sweats, unexplained weight loss, history of cancer.  Has had pain that wakes her at night  Past medical history: HTN Fibromyalgia Depression CKD GERD IBS Neuropathy DM (last A1c was 6.5) OSA Restrictive lung disease  Allergies: NKDA  Past surgical history:  Hysterectomy Sinus surgery Brain and neuro some clipping Appendectomy Cholecystectomy Tubal ligation Bilateral meniscus surgery  Social history: Denies use of  nicotine product (smoking, vaping, patches, smokeless) Alcohol use: denies Denies recreational drug use   Physical Exam:  BMI of 34.1  General: no acute distress, appears stated age Neurologic: alert, answering questions appropriately, following commands Respiratory: unlabored breathing on room air, symmetric chest rise Psychiatric: appropriate affect, normal cadence to speech   MSK (spine):  -Strength exam      Left  Right Grip strength                5/5  5/5 Interosseus   5/5   5/5 Wrist extension  5/5  5/5 Wrist flexion   5/5  5/5 Elbow flexion   5/5  5/5 Deltoid    5/5  5/5  EHL    5/5  5/5 TA    5/5  5/5 GSC    5/5  5/5 Knee extension  5/5  5/5 Hip flexion   5/5  5/5  -Sensory exam    Sensation intact to light touch in L3-S1 nerve distributions of bilateral lower extremities  Sensation intact to light touch in C5-T1 nerve distributions of bilateral upper extremities  -Brachioradialis DTR: 2/4 on the left, 2/4 on the right -Biceps DTR: 2/4 on the left, 2/4 on the right -Triceps DTR: 2/4 on the left, 2/4 on the right -Achilles DTR: 2/4 on the left, 2/4 on the right -Patellar tendon DTR: 2/4 on the left, 2/4 on the right  -Spurling: negative bilaterally -Hoffman sign: negative bilaterally -Clonus: no beats bilaterally -Interosseous wasting: none seen -Grip and release test: negative  -Romberg: negative  -Gait: normal  Left shoulder exam: no pain through range of motion, negative jobe, no weakness with external rotation with arm at side, negative belly press Right shoulder exam: no pain through range of motion, negative jobe, no weakness with external rotation with arm at side, negative belly press  Tinel's at wrist: negative bilaterally Phalen's at wrist: negative bilaterally Durkan's: negative bilaterally  Tinel's at elbow: negative bilaterally  Imaging: XRs of the cervical spine from 10/02/2023 were independently reviewed and interpreted, showing  disc height loss with anterior osteophyte formation at C5/6 and C6/7. Small anterior osteophyte formation seen at C4/5. No evidence of instability on flexion/extension views. No fracture or dislocation seen.   MRI of the cervical spine from 07/31/2023 was independently reviewed and interpreted, showing bilateral foraminal stenosis at C5/6 and C6/7. DDD at C5/6 and C6/7. No significant central stenosis. No T2 cord signal change.    Patient name: Cassandra Reid Patient MRN: [id number] Date of visit: 10/02/23

## 2023-10-03 ENCOUNTER — Encounter: Payer: Self-pay | Admitting: Orthopedic Surgery

## 2023-10-04 ENCOUNTER — Telehealth: Payer: Self-pay | Admitting: Family Medicine

## 2023-10-04 LAB — HELIX MOLECULAR SCREEN: Genetic Analysis Overall Interpretation: NEGATIVE

## 2023-10-04 LAB — GENECONNECT MOLECULAR SCREEN

## 2023-10-04 NOTE — Telephone Encounter (Signed)
Received preop clearance form for upcoming C5-6, C6-7 anterior cervical discectomy and fusion.  Please schedule patient for preop clearance appt given comorbidities.

## 2023-10-11 DIAGNOSIS — E785 Hyperlipidemia, unspecified: Secondary | ICD-10-CM | POA: Diagnosis not present

## 2023-10-12 LAB — LIPID PANEL
Chol/HDL Ratio: 2.1 {ratio} (ref 0.0–4.4)
Cholesterol, Total: 91 mg/dL — ABNORMAL LOW (ref 100–199)
HDL: 44 mg/dL (ref >39)
LDL Chol Calc (NIH): 28 mg/dL (ref 0–99)
Triglycerides: 96 mg/dL (ref 0–149)
VLDL Cholesterol Cal: 19 mg/dL (ref 5–40)

## 2023-10-12 LAB — HEPATIC FUNCTION PANEL
ALT: 31 [IU]/L (ref 0–32)
AST: 29 [IU]/L (ref 0–40)
Albumin: 4.4 g/dL (ref 3.9–4.9)
Alkaline Phosphatase: 105 [IU]/L (ref 44–121)
Bilirubin Total: 0.4 mg/dL (ref 0.0–1.2)
Bilirubin, Direct: 0.19 mg/dL (ref 0.00–0.40)
Total Protein: 6.8 g/dL (ref 6.0–8.5)

## 2023-10-15 ENCOUNTER — Encounter: Payer: Self-pay | Admitting: Family Medicine

## 2023-10-15 ENCOUNTER — Ambulatory Visit: Payer: Medicare PPO | Admitting: Family Medicine

## 2023-10-15 VITALS — BP 120/70 | HR 66 | Ht 61.0 in | Wt 180.2 lb

## 2023-10-15 DIAGNOSIS — J984 Other disorders of lung: Secondary | ICD-10-CM

## 2023-10-15 DIAGNOSIS — Z01818 Encounter for other preprocedural examination: Secondary | ICD-10-CM | POA: Diagnosis not present

## 2023-10-15 NOTE — Addendum Note (Signed)
Addended by: Evette Georges B on: 10/15/2023 05:43 PM   Modules accepted: Orders

## 2023-10-15 NOTE — Patient Instructions (Signed)
You are at moderate to severe risk for your surgery.  I will send this form to your surgeon.  I would hold your Lasix the morning of the surgery.  Be sure to follow-up with [place] after the surgery estimation point

## 2023-10-15 NOTE — Progress Notes (Cosign Needed Addendum)
  Patient Name: Cassandra Reid Date of Birth: 07/25/1956 Date of Visit: 10/15/23 PCP: Glendale Chard, DO  Chief Complaint: preoperative evaluation Surgeon:  Dr. Christell Constant Surgery: ACDF of C5-7 Date of Procedure: TBD  Subjective: Cassandra Reid is a pleasant 67 y.o. with medical history significant for the below presenting today for preoperative evaluation.   Prior surgeries: Two brain surgeries with clips and coils due to aneurysms, hysterectomy, cholecystectomy, sinus polyp/cyst removal, two meniscal tear surgeries - did well with all of these  Review of systems: Chest pain with exertion? Yes, but this is chronic x15 years and has been seen before and felt to be costochondritis Dyspnea on exertion? Yes Can you walk 2 blocks without dyspnea or chest pain? Yes Can you walk up a flight of stairs without chest pain or dyspnea? No Any history of easy bruising or bleeding? No but does bruise a lot when she falls Any family history of bleeding? No Any personal or family of difficulty with anesthesia? No Any history of sleep apnea? Yes  Medical History: Cardiac conditions?  Yes, paroxysmal atrial tachycardia; workup with cardiology revealed negative CT coronaries History of VTE? No History of adverse surgical outcome? No  Diabetes? Yes Obesity? Yes OSA? Yes  Other conditions?  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   Objective: Vitals:   10/15/23 1348 10/15/23 1455  BP: (!) 144/53 120/70  Pulse: 66   SpO2: 100%    Filed Weights   10/15/23 1348  Weight: 180 lb 4 oz (81.8 kg)   General: Alert and oriented, in NAD Skin: Warm, dry, and intact HEENT: NCAT, EOM grossly normal, midline nasal septum Cardiac: RRR, no m/r/g appreciated Respiratory: CTAB, breathing and speaking comfortably on RA Abdominal: Soft, nontender, nondistended,  normoactive bowel sounds Extremities: Moves all extremities grossly equally Neurological: No gross focal deficit Psychiatric: Appropriate mood and affect  Assessment and Plan: Patient is at moderate to severe risk for a moderate/intermediate risk surgery given her cardiac, respiratory, and systemic chronic conditions.  The patient has been medically optimized for this procedure and has no further modifiable risk factors. In addition,  I make the following recommendations for further evaluation and medication adjustments prior to their  planned procedure: -Hold Ozempic 1 week prior to surgery -Hold Lasix the morning of surgery I have sent a preoperative evaluation form back to her surgeon.  I have also referred her to pulmonology for further evaluation of her restrictive lung disease.  [name], MD  Family Medicine Teaching Service

## 2023-10-16 ENCOUNTER — Telehealth: Payer: Self-pay | Admitting: Orthopedic Surgery

## 2023-10-16 DIAGNOSIS — M5412 Radiculopathy, cervical region: Secondary | ICD-10-CM

## 2023-10-16 NOTE — Telephone Encounter (Signed)
Patient called and said that her doctor said that he didn't think she need surgery and thinks its better to get an injection or try a brace before surgery. [phone]

## 2023-10-18 NOTE — Addendum Note (Signed)
Addended by: Willia Craze on: 10/18/2023 08:38 AM   Modules accepted: Orders

## 2023-10-22 ENCOUNTER — Encounter: Payer: Self-pay | Admitting: Orthopedic Surgery

## 2023-10-25 ENCOUNTER — Telehealth: Payer: Self-pay

## 2023-10-25 NOTE — Telephone Encounter (Signed)
Patient would like valium to be sent into her pharm [facility] [address]

## 2023-10-29 ENCOUNTER — Encounter (HOSPITAL_BASED_OUTPATIENT_CLINIC_OR_DEPARTMENT_OTHER): Payer: Self-pay

## 2023-10-29 ENCOUNTER — Telehealth: Payer: Self-pay | Admitting: Pulmonary Disease

## 2023-10-29 NOTE — Telephone Encounter (Signed)
PT would like to move from Dr. Val Eagle to another Dr.   Maurice Small seen a year ago by Dr. Val Eagle. Nothing against Dr. Val Eagle at all.  Referral for: Restrictive lung disease   Dr. Everardo All has availability tomorrow. Will you both authorize so we can schedule?

## 2023-10-29 NOTE — Telephone Encounter (Signed)
Ok to switch by me. Prefer 30 min visit if able. Otherwise 15 min ok

## 2023-10-30 NOTE — Telephone Encounter (Signed)
NFN 

## 2023-11-02 ENCOUNTER — Other Ambulatory Visit: Payer: Self-pay | Admitting: Family Medicine

## 2023-11-02 DIAGNOSIS — E1142 Type 2 diabetes mellitus with diabetic polyneuropathy: Secondary | ICD-10-CM

## 2023-11-07 ENCOUNTER — Encounter: Payer: Medicare PPO | Admitting: Physical Medicine and Rehabilitation

## 2023-11-07 ENCOUNTER — Telehealth: Payer: Self-pay | Admitting: Physical Medicine and Rehabilitation

## 2023-11-07 NOTE — Telephone Encounter (Signed)
Pt called to reschedule appt. Pt is sick. Please call pt at [phone].

## 2023-11-11 ENCOUNTER — Ambulatory Visit: Payer: Medicare PPO | Admitting: Orthopedic Surgery

## 2023-11-11 DIAGNOSIS — M5412 Radiculopathy, cervical region: Secondary | ICD-10-CM | POA: Diagnosis not present

## 2023-11-11 DIAGNOSIS — M47812 Spondylosis without myelopathy or radiculopathy, cervical region: Secondary | ICD-10-CM | POA: Diagnosis not present

## 2023-11-11 DIAGNOSIS — M5481 Occipital neuralgia: Secondary | ICD-10-CM | POA: Diagnosis not present

## 2023-11-25 ENCOUNTER — Telehealth: Payer: Self-pay | Admitting: Physical Medicine and Rehabilitation

## 2023-11-25 ENCOUNTER — Encounter: Payer: Medicare PPO | Admitting: Physical Medicine and Rehabilitation

## 2023-11-25 NOTE — Telephone Encounter (Signed)
 Patient would like to [facility]. Her steps are ice

## 2023-11-26 ENCOUNTER — Inpatient Hospital Stay: Payer: Medicare HMO | Attending: Hematology and Oncology

## 2023-11-26 ENCOUNTER — Encounter: Payer: Self-pay | Admitting: Hematology and Oncology

## 2023-11-26 ENCOUNTER — Inpatient Hospital Stay: Payer: Medicare HMO | Admitting: Hematology and Oncology

## 2023-11-26 ENCOUNTER — Inpatient Hospital Stay: Payer: Medicare HMO

## 2023-11-26 VITALS — BP 147/80 | HR 80 | Temp 97.4°F | Resp 18 | Ht 61.0 in | Wt 179.0 lb

## 2023-11-26 VITALS — BP 146/73 | HR 70 | Resp 16

## 2023-11-26 DIAGNOSIS — D751 Secondary polycythemia: Secondary | ICD-10-CM | POA: Diagnosis present

## 2023-11-26 DIAGNOSIS — Z79899 Other long term (current) drug therapy: Secondary | ICD-10-CM | POA: Diagnosis not present

## 2023-11-26 DIAGNOSIS — R5383 Other fatigue: Secondary | ICD-10-CM | POA: Diagnosis not present

## 2023-11-26 DIAGNOSIS — R21 Rash and other nonspecific skin eruption: Secondary | ICD-10-CM | POA: Insufficient documentation

## 2023-11-26 DIAGNOSIS — R42 Dizziness and giddiness: Secondary | ICD-10-CM | POA: Insufficient documentation

## 2023-11-26 DIAGNOSIS — R55 Syncope and collapse: Secondary | ICD-10-CM | POA: Insufficient documentation

## 2023-11-26 LAB — CBC WITH DIFFERENTIAL (CANCER CENTER ONLY)
Abs Immature Granulocytes: 0.04 10*3/uL (ref 0.00–0.07)
Basophils Absolute: 0.1 10*3/uL (ref 0.0–0.1)
Basophils Relative: 1 %
Eosinophils Absolute: 0.1 10*3/uL (ref 0.0–0.5)
Eosinophils Relative: 1 %
HCT: 46 % (ref 36.0–46.0)
Hemoglobin: 15.2 g/dL — ABNORMAL HIGH (ref 12.0–15.0)
Immature Granulocytes: 1 %
Lymphocytes Relative: 16 %
Lymphs Abs: 1.4 10*3/uL (ref 0.7–4.0)
MCH: 27.9 pg (ref 26.0–34.0)
MCHC: 33 g/dL (ref 30.0–36.0)
MCV: 84.4 fL (ref 80.0–100.0)
Monocytes Absolute: 0.6 10*3/uL (ref 0.1–1.0)
Monocytes Relative: 7 %
Neutro Abs: 6.5 10*3/uL (ref 1.7–7.7)
Neutrophils Relative %: 74 %
Platelet Count: 198 10*3/uL (ref 150–400)
RBC: 5.45 MIL/uL — ABNORMAL HIGH (ref 3.87–5.11)
RDW: 14.6 % (ref 11.5–15.5)
WBC Count: 8.7 10*3/uL (ref 4.0–10.5)
nRBC: 0 % (ref 0.0–0.2)

## 2023-11-26 NOTE — Assessment & Plan Note (Signed)
 Lab review 06/26/2022: Hemoglobin 15, hematocrit 47 03/18/2023: Erythropoietin : 13, JAK2 mutation: Negative 05/20/2023: Hemoglobin 17.1, hematocrit 52.1, WBC 7.5, platelets 189 08/08/2023: Hemoglobin 16.5, hematocrit 47.9 09/26/23: Hb 14.8, Hct: 44.8   the patient has secondary polycythemia.  Possibly related to her lung condition. Phlebotomy: 08/12/2023   Goal of phlebotomy: Hematocrit below 45 Return to clinic in 2 month with labs and follow-up

## 2023-11-26 NOTE — Progress Notes (Signed)
 Patient Care Team: Cleotilde Perkins, DO as PCP - General (Family Medicine) Pietro Redell RAMAN, MD as PCP - Cardiology (Cardiology) [facility], Donell Cardinal, MD as Consulting Physician (Endocrinology) Skeet Juliene SAUNDERS, DO as Consulting Physician (Neurology) Neda Jennet LABOR, MD as Consulting Physician (Pulmonary Disease)  DIAGNOSIS:  Encounter Diagnosis  Name Primary?   Polycythemia, secondary Yes     CHIEF COMPLIANT: Follow-up of polycythemia complaining of severe fatigue  HISTORY OF PRESENT ILLNESS:   History of Present Illness   The patient, with a history of polycythemia vera, presents with increased fatigue and dizziness over the past month. She describes the fatigue as so severe that even washing a dish is a significant effort. The dizziness is described as room spinning upon standing, to the point where she fears fainting. She also reports a transient rash on her stomach that lasted about a week and has since resolved. The patient had a viral gastrointestinal illness over the holidays but has since recovered.         ALLERGIES:  has no known allergies.  MEDICATIONS:  Current Outpatient Medications  Medication Sig Dispense Refill   albuterol  (VENTOLIN  HFA) 108 (90 Base) MCG/ACT inhaler INHALE 2 PUFFS INTO THE LUNGS 2 (TWO) TIMES DAILY. PLUS 2 PUFFS AS NEEDED 18 each 1   amLODipine  (NORVASC ) 5 MG tablet TAKE 1 TABLET EVERY DAY 90 tablet 3   Ascorbic Acid (VITAMIN C ADULT GUMMIES PO) Take 1 each by mouth daily in the afternoon.     atorvastatin  (LIPITOR) 40 MG tablet Take 40 mg by mouth daily.     BD PEN NEEDLE NANO 2ND GEN 32G X 4 MM MISC USE WITH INSULIN  100 each 4   blood glucose meter kit and supplies Dispense based on patient and insurance preference. Use up to four times daily as directed. (FOR ICD-10 E10.9, E11.9). 1 each 0   Budeson-Glycopyrrol-Formoterol  (BREZTRI  AEROSPHERE) 160-9-4.8 MCG/ACT AERO Inhale 2 puffs into the lungs in the morning and at bedtime.      Continuous Blood Gluc Sensor (DEXCOM G6 SENSOR) MISC Use as directed to check blood sugar. 4 each 11   Continuous Blood Gluc Transmit (DEXCOM G6 TRANSMITTER) MISC Use as directed to check blood sugar. 1 each 12   dapagliflozin  propanediol (FARXIGA ) 10 MG TABS tablet Take 1 tablet (10 mg total) by mouth daily. 90 tablet 3   DULoxetine  (CYMBALTA ) 60 MG capsule TAKE 1 CAPSULE EVERY DAY 90 capsule 3   ezetimibe  (ZETIA ) 10 MG tablet TAKE 1 TABLET EVERY DAY 90 tablet 3   famotidine  (PEPCID ) 20 MG tablet Take 1 tablet (20 mg total) by mouth daily. 90 tablet 3   FIBER SELECT GUMMIES PO Take 1 tablet by mouth daily in the afternoon.     fluticasone  (FLONASE ) 50 MCG/ACT nasal spray Place 1 spray into both nostrils daily as needed for allergies or rhinitis. 9.9 mL 1   fluticasone  furoate-vilanterol (BREO ELLIPTA ) 200-25 MCG/ACT AEPB Inhale 1 puff into the lungs daily. 60 each 3   Fluticasone -Umeclidin-Vilant (TRELEGY ELLIPTA ) 200-62.5-25 MCG/ACT AEPB Inhale 1 puff into the lungs daily.     furosemide  (LASIX ) 40 MG tablet Take 1 tablet (40 mg total) by mouth as needed for edema. (Patient taking differently: Take 40 mg by mouth daily as needed (ankle swelling.).) 90 tablet 3   gabapentin  (NEURONTIN ) 600 MG tablet TAKE 0.5-1 TABLET (300-600 MG TOTAL) BY MOUTH 3 (THREE) TIMES DAILY. (Patient taking differently: Take 600 mg by mouth daily.) 270 tablet 3   insulin  aspart (  FIASP ) 100 UNIT/ML FlexTouch Pen Inject 14-16 Units into the skin daily with breakfast AND 12-14 Units daily with lunch AND 10-12 Units daily with supper. This is a patient assistance medication. Patient may not be approved and/or have medication. Please ask and verify when performing med review.. 15 mL 3   insulin  degludec (TRESIBA ) 100 UNIT/ML FlexTouch Pen Inject 50 Units into the skin daily. This is a patient assistance medication. Patient may not be approved and/or have medication. Please ask and verify when performing med review. 15 mL 3    lidocaine  (LIDODERM ) 5 % Place 1 patch onto the skin daily. Remove & Discard patch within 12 hours or as directed by MD 30 patch 0   lisinopril  (ZESTRIL ) 40 MG tablet TAKE 1 TABLET EVERY DAY 90 tablet 3   loratadine  (CLARITIN ) 10 MG tablet TAKE 1 TABLET EVERY DAY 90 tablet 3   LORazepam  (ATIVAN ) 0.5 MG tablet TAKE 1 TABLET EVERY 8 HOURS AS NEEDED FOR ANXIETY 90 tablet 0   Magnesium  500 MG TABS Take 500 mg by mouth daily in the afternoon.     metoprolol  succinate (TOPROL -XL) 50 MG 24 hr tablet Take 1 tablet (50 mg total) by mouth daily. Take with or immediately following a meal. 90 tablet 3   Multiple Vitamin (MULTIVITAMIN WITH MINERALS) TABS tablet Take 1 tablet by mouth in the morning.     OneTouch Delica Lancets 33G MISC Please use to check blood sugar up to 4 times daily. E11.42 100 each 12   Oyster Shell (OYSTER CALCIUM ) 500 MG TABS tablet Take 500 mg of elemental calcium  by mouth daily in the afternoon.     pantoprazole  (PROTONIX ) 40 MG tablet TAKE 1 TABLET EVERY DAY 90 tablet 3   potassium chloride  SA (KLOR-CON  M) 20 MEQ tablet TAKE 1 TABLET AS NEEDED (TAKE AS DIRECTED WITH LASIX  AS NEEDED FOR EDEMA) 90 tablet 3   Semaglutide , 2 MG/DOSE, (OZEMPIC , 2 MG/DOSE,) 8 MG/3ML SOPN Inject 2 mg into the skin once a week. 9 mL 3   traZODone  (DESYREL ) 50 MG tablet TAKE 1/2 TABLET BY MOUTH AT BEDTIME AS NEEDED FOR SLEEP. 45 tablet 1   TRUE METRIX BLOOD GLUCOSE TEST test strip TEST BLOOD SUGAR AS INSTRUCTED 350 strip 3   No current facility-administered medications for this visit.    PHYSICAL EXAMINATION: ECOG PERFORMANCE STATUS: 1 - Symptomatic but completely ambulatory  Vitals:   11/26/23 1420  BP: (!) 147/80  Pulse: 80  Resp: 18  Temp: (!) 97.4 F (36.3 C)  SpO2: 98%   Filed Weights   11/26/23 1420  Weight: 179 lb (81.2 kg)    Physical Exam   SKIN: Resolved rash on stomach.      (exam performed in the presence of a chaperone)  LABORATORY DATA:  I have reviewed the data as  listed    Latest Ref Rng & Units 10/11/2023    8:59 AM 09/26/2023    1:41 PM 08/08/2023    8:51 AM  CMP  Glucose 70 - 99 mg/dL  806  788   BUN 8 - 23 mg/dL  18  18   Creatinine 9.55 - 1.00 mg/dL  8.92  8.90   Sodium 864 - 145 mmol/L  138  138   Potassium 3.5 - 5.1 mmol/L  4.1  4.1   Chloride 98 - 111 mmol/L  105  106   CO2 22 - 32 mmol/L  25  24   Calcium  8.9 - 10.3 mg/dL  9.8  9.5  Total Protein 6.0 - 8.5 g/dL 6.8  6.9  6.8   Total Bilirubin 0.0 - 1.2 mg/dL 0.4  0.5  0.7   Alkaline Phos 44 - 121 IU/L 105  88  83   AST 0 - 40 IU/L 29  23  23    ALT 0 - 32 IU/L 31  31  37     Lab Results  Component Value Date   WBC 8.7 11/26/2023   HGB 15.2 (H) 11/26/2023   HCT 46.0 11/26/2023   MCV 84.4 11/26/2023   PLT 198 11/26/2023   NEUTROABS 6.5 11/26/2023    ASSESSMENT & PLAN:  Polycythemia, secondary Lab review 06/26/2022: Hemoglobin 15, hematocrit 47 03/18/2023: Erythropoietin : 13, JAK2 mutation: Negative 05/20/2023: Hemoglobin 17.1, hematocrit 52.1, WBC 7.5, platelets 189 08/08/2023: Hemoglobin 16.5, hematocrit 47.9 09/26/23: Hb 14.8, Hct: 44.8   the patient has secondary polycythemia.  Possibly related to her lung condition. Phlebotomy: 08/12/2023, 11/26/2023   Goal of phlebotomy: Hematocrit below 45 Return to clinic in 3 month with labs and follow-up      No orders of the defined types were placed in this encounter.  The patient has a good understanding of the overall plan. she agrees with it. she will call with any problems that may develop before the next visit here. Total time spent: 30 mins including face to face time and time spent for planning, charting and co-ordination of care   Viinay K Briley Sulton, MD 11/26/23

## 2023-11-26 NOTE — Progress Notes (Signed)
 Cassandra Reid presents today for phlebotomy per MD orders. Phlebotomy procedure started at 1521 and ended at 1535. 484 grams removed. 20G IV used to R-AC. Patient observed for 15 minutes after procedure without any incident. Patient tolerated procedure well. Food and beverage provided.  IV needle removed intact.

## 2023-11-26 NOTE — Patient Instructions (Signed)

## 2023-12-03 ENCOUNTER — Encounter (HOSPITAL_BASED_OUTPATIENT_CLINIC_OR_DEPARTMENT_OTHER): Payer: Self-pay

## 2023-12-04 ENCOUNTER — Encounter: Payer: Self-pay | Admitting: Student

## 2023-12-04 DIAGNOSIS — M5481 Occipital neuralgia: Secondary | ICD-10-CM | POA: Diagnosis not present

## 2023-12-04 DIAGNOSIS — E1122 Type 2 diabetes mellitus with diabetic chronic kidney disease: Secondary | ICD-10-CM

## 2023-12-04 DIAGNOSIS — H6992 Unspecified Eustachian tube disorder, left ear: Secondary | ICD-10-CM

## 2023-12-04 DIAGNOSIS — F419 Anxiety disorder, unspecified: Secondary | ICD-10-CM

## 2023-12-06 MED ORDER — FLUTICASONE PROPIONATE 50 MCG/ACT NA SUSP: 1.0000 | Freq: Every day | NASAL | 1 refills | Status: DC | PRN

## 2023-12-06 MED ORDER — FUROSEMIDE 40 MG PO TABS: 40.0000 mg | ORAL_TABLET | ORAL | Status: DC | PRN

## 2023-12-06 MED ORDER — LORAZEPAM 0.5 MG PO TABS: 0.5000 mg | ORAL_TABLET | Freq: Three times a day (TID) | ORAL | 0 refills | Status: DC | PRN

## 2023-12-07 ENCOUNTER — Encounter: Payer: Self-pay | Admitting: Student

## 2023-12-13 ENCOUNTER — Ambulatory Visit (HOSPITAL_BASED_OUTPATIENT_CLINIC_OR_DEPARTMENT_OTHER): Payer: Medicare HMO | Admitting: Pulmonary Disease

## 2023-12-13 ENCOUNTER — Encounter (HOSPITAL_BASED_OUTPATIENT_CLINIC_OR_DEPARTMENT_OTHER): Payer: Self-pay | Admitting: Pulmonary Disease

## 2023-12-13 VITALS — BP 134/70 | HR 91 | Resp 14 | Ht 61.0 in | Wt 174.3 lb

## 2023-12-13 DIAGNOSIS — J984 Other disorders of lung: Secondary | ICD-10-CM

## 2023-12-13 DIAGNOSIS — R0602 Shortness of breath: Secondary | ICD-10-CM

## 2023-12-13 DIAGNOSIS — G4733 Obstructive sleep apnea (adult) (pediatric): Secondary | ICD-10-CM | POA: Diagnosis not present

## 2023-12-13 DIAGNOSIS — G4739 Other sleep apnea: Secondary | ICD-10-CM

## 2023-12-13 NOTE — Patient Instructions (Signed)
Possible asthma Possible restrictive defect --ORDER pulmonary function test --CONTINUE Breztri TWO puffs in the morning and night. STOP this two days before the breathing test  OSA not on CPAP --Unable to tolerate CPAP device --Encourage to continue attempts to wear CPAP as the risk of comorbidities including heart attack and stroke increase without treatment for complex OSA --Order ONO on room air

## 2023-12-13 NOTE — Progress Notes (Signed)
Subjective:   PATIENT ID: Cassandra Reid: female DOB: 06-18-1956, MRN: [id number]  Chief Complaint  Patient presents with   Establish Care    Former Dr. Wynona Neat patient- OSA/ILD--not using her CPAP- has tried but feels like she is suffocating.     Reason for Visit: New patient. Former Dr. Aldean Ast  Cassandra Reid is a 68 year old female negligible smoking history with complex sleep apnea not on CPAP, HTN, atrial tachycardia/palpitations, secondary polycythemia vera with occasional phlebotomy, CKD III, DM2, hx aneurysms s/p coil and clips, legally blind, morbid obesity on ozempic, fibromylagia, GERD, chronic constipation who presents for pulmonary management.  She was previously seen by Dr. Aldean Ast for OSA but has not been on CPAP for several months. HST on 01/03/22 showed mild complex sleep apnea/hypopnea, AHI 9/hr with nocturnal hypoxia. She reports shortness of breath with walking up steps. Does have coughing at night that began a few weeks ago but this is not a chronic issue. No wheezing. On Breztri for the last three months.    I have personally reviewed patient's past medical/family/social history, allergies, current medications.  Past Medical History:  Diagnosis Date   Cerebral aneurysm without rupture    CKD stage 3 due to type 2 diabetes mellitus 04/15/2019   DDD (degenerative disc disease), cervical 04/16/2019   Encephalomalacia on imaging study 08/25/2020   Brain MRI 08/25/21: Few scattered areas of encephalomalacia and gliosis within the overlying left frontal lobe most likely postoperative in nature.   Fatty liver 05/31/2020   Generalized anxiety disorder    GERD (gastroesophageal reflux disease)    Hyperlipidemia associated with type 2 diabetes mellitus 07/18/2018   Hypertension associated with diabetes 07/18/2018   Incontinence 09/22/2021   Bowel and bladder.    Legally blind    Major depressive disorder    Osteoporosis 01/18/2021   Paroxysmal atrial  tachycardia (HCC)    On telemetry   Type 2 diabetes mellitus with diabetic polyneuropathy, with long-term current use of insulin 07/18/2018   has dexcom G7  - sensor left upper arm     Family History  Problem Relation Age of Onset   Alcohol abuse Mother    Diabetes Mother    Hypertension Mother    Kidney disease Mother    COPD Father    Diabetes Father    Early death Father    Heart disease Father    Hyperlipidemia Father    Hypertension Father    Diabetes Sister    Hypertension Sister    Breast cancer Sister    Diabetes Sister    Hypertension Sister    Kidney disease Sister    Cancer Maternal Grandmother    Alcohol abuse Maternal Grandfather    Cancer Maternal Grandfather    Cancer Paternal Grandmother    Cancer Paternal Grandfather      Social History   Occupational History   Occupation: disabled  Tobacco Use   Smoking status: Former    Current packs/day: 0.00    Average packs/day: 1 pack/day for 3.0 years (3.0 ttl pk-yrs)    Types: Cigarettes    Start date: 06/19/1964    Quit date: 06/20/1967    Years since quitting: 56.5    Passive exposure: Past   Smokeless tobacco: Never  Vaping Use   Vaping status: Never Used  Substance and Sexual Activity   Alcohol use: Never   Drug use: Not Currently    Types: Cocaine, Marijuana    Comment: 1976-1980   Sexual  activity: Not Currently    Birth control/protection: Surgical    Comment: Hysterectomy    No Known Allergies   Outpatient Medications Prior to Visit  Medication Sig Dispense Refill   albuterol (VENTOLIN HFA) 108 (90 Base) MCG/ACT inhaler INHALE 2 PUFFS INTO THE LUNGS 2 (TWO) TIMES DAILY. PLUS 2 PUFFS AS NEEDED 18 each 1   amLODipine (NORVASC) 5 MG tablet TAKE 1 TABLET EVERY DAY 90 tablet 3   Ascorbic Acid (VITAMIN C ADULT GUMMIES PO) Take 1 each by mouth daily in the afternoon.     atorvastatin (LIPITOR) 40 MG tablet Take 40 mg by mouth daily.     BD PEN NEEDLE NANO 2ND GEN 32G X 4 MM MISC USE WITH INSULIN  100 each 4   blood glucose meter kit and supplies Dispense based on patient and insurance preference. Use up to four times daily as directed. (FOR ICD-10 E10.9, E11.9). 1 each 0   Budeson-Glycopyrrol-Formoterol (BREZTRI AEROSPHERE) 160-9-4.8 MCG/ACT AERO Inhale 2 puffs into the lungs in the morning and at bedtime.     Continuous Blood Gluc Sensor (DEXCOM G6 SENSOR) MISC Use as directed to check blood sugar. 4 each 11   Continuous Blood Gluc Transmit (DEXCOM G6 TRANSMITTER) MISC Use as directed to check blood sugar. 1 each 12   dapagliflozin propanediol (FARXIGA) 10 MG TABS tablet Take 1 tablet (10 mg total) by mouth daily. 90 tablet 3   DULoxetine (CYMBALTA) 60 MG capsule TAKE 1 CAPSULE EVERY DAY 90 capsule 3   ezetimibe (ZETIA) 10 MG tablet TAKE 1 TABLET EVERY DAY 90 tablet 3   famotidine (PEPCID) 20 MG tablet Take 1 tablet (20 mg total) by mouth daily. 90 tablet 3   FIBER SELECT GUMMIES PO Take 1 tablet by mouth daily in the afternoon.     fluticasone (FLONASE) 50 MCG/ACT nasal spray Place 1 spray into both nostrils daily as needed for allergies or rhinitis. 9.9 mL 1   fluticasone furoate-vilanterol (BREO ELLIPTA) 200-25 MCG/ACT AEPB Inhale 1 puff into the lungs daily. 60 each 3   furosemide (LASIX) 40 MG tablet Take 1 tablet (40 mg total) by mouth as needed for edema.     gabapentin (NEURONTIN) 600 MG tablet TAKE 0.5-1 TABLET (300-600 MG TOTAL) BY MOUTH 3 (THREE) TIMES DAILY. (Patient taking differently: Take 600 mg by mouth daily.) 270 tablet 3   insulin aspart (FIASP) 100 UNIT/ML FlexTouch Pen Inject 14-16 Units into the skin daily with breakfast AND 12-14 Units daily with lunch AND 10-12 Units daily with supper. This is a patient assistance medication. Patient may not be approved and/or have medication. Please ask and verify when performing med review.. 15 mL 3   insulin degludec (TRESIBA) 100 UNIT/ML FlexTouch Pen Inject 50 Units into the skin daily. This is a patient assistance medication.  Patient may not be approved and/or have medication. Please ask and verify when performing med review. 15 mL 3   lidocaine (LIDODERM) 5 % Place 1 patch onto the skin daily. Remove & Discard patch within 12 hours or as directed by MD 30 patch 0   lisinopril (ZESTRIL) 40 MG tablet TAKE 1 TABLET EVERY DAY 90 tablet 3   loratadine (CLARITIN) 10 MG tablet TAKE 1 TABLET EVERY DAY 90 tablet 3   LORazepam (ATIVAN) 0.5 MG tablet Take 1 tablet (0.5 mg total) by mouth every 8 (eight) hours as needed for anxiety. 90 tablet 0   Magnesium 500 MG TABS Take 500 mg by mouth daily in the afternoon.  metoprolol succinate (TOPROL-XL) 50 MG 24 hr tablet Take 1 tablet (50 mg total) by mouth daily. Take with or immediately following a meal. 90 tablet 3   Multiple Vitamin (MULTIVITAMIN WITH MINERALS) TABS tablet Take 1 tablet by mouth in the morning.     OneTouch Delica Lancets 33G MISC Please use to check blood sugar up to 4 times daily. E11.42 100 each 12   Oyster Shell (OYSTER CALCIUM) 500 MG TABS tablet Take 500 mg of elemental calcium by mouth daily in the afternoon.     pantoprazole (PROTONIX) 40 MG tablet TAKE 1 TABLET EVERY DAY 90 tablet 3   potassium chloride SA (KLOR-CON M) 20 MEQ tablet TAKE 1 TABLET AS NEEDED (TAKE AS DIRECTED WITH LASIX AS NEEDED FOR EDEMA) 90 tablet 3   Semaglutide, 2 MG/DOSE, (OZEMPIC, 2 MG/DOSE,) 8 MG/3ML SOPN Inject 2 mg into the skin once a week. 9 mL 3   traZODone (DESYREL) 50 MG tablet TAKE 1/2 TABLET BY MOUTH AT BEDTIME AS NEEDED FOR SLEEP. 45 tablet 1   TRUE METRIX BLOOD GLUCOSE TEST test strip TEST BLOOD SUGAR AS INSTRUCTED 350 strip 3   Fluticasone-Umeclidin-Vilant (TRELEGY ELLIPTA) 200-62.5-25 MCG/ACT AEPB Inhale 1 puff into the lungs daily.     No facility-administered medications prior to visit.    Review of Systems  Constitutional:  Negative for chills, diaphoresis, fever, malaise/fatigue and weight loss.  HENT:  Negative for congestion.   Respiratory:  Positive for  cough and shortness of breath. Negative for hemoptysis, sputum production and wheezing.   Cardiovascular:  Negative for chest pain, palpitations and leg swelling.     Objective:   Vitals:   12/13/23 1115  BP: 134/70  Pulse: 91  Resp: 14  SpO2: 98%  Weight: 174 lb 4.8 oz (79.1 kg)  Height: 5\' 1"  (1.549 m)   SpO2: 98 %  Physical Exam: General: Well-appearing, no acute distress HENT: [place], AT Eyes: EOMI, no scleral icterus Respiratory: Clear to auscultation bilaterally.  No crackles, wheezing or rales Cardiovascular: RRR, -M/R/G, no JVD Extremities:-Edema,-tenderness Neuro: AAO x4, CNII-XII grossly intact Psych: Normal mood, normal affect  Data Reviewed:  Imaging: CXR 02/12/23 - No infiltrate effusion or edema  PFT: 02/20/23 Spirometry  FVC 2.07 (73%) FEV1 2.14 (89%) Ratio 92 . Post-bronchodilator increased FVC with 29% change Interpretation: Mildly reduced FVC. No obstructive defect but sig BD response. Consider lung volumes to confirm restriction.  Labs: CBC    Component Value Date/Time   WBC 8.7 11/26/2023 1359   WBC 8.1 02/12/2023 1624   RBC 5.45 (H) 11/26/2023 1359   HGB 15.2 (H) 11/26/2023 1359   HGB 17.1 (H) 05/20/2023 1121   HCT 46.0 11/26/2023 1359   HCT 52.1 (H) 05/20/2023 1121   PLT 198 11/26/2023 1359   PLT 189 05/20/2023 1121   MCV 84.4 11/26/2023 1359   MCV 87 05/20/2023 1121   MCH 27.9 11/26/2023 1359   MCHC 33.0 11/26/2023 1359   RDW 14.6 11/26/2023 1359   RDW 13.4 05/20/2023 1121   LYMPHSABS 1.4 11/26/2023 1359   LYMPHSABS 1.4 05/20/2023 1121   MONOABS 0.6 11/26/2023 1359   EOSABS 0.1 11/26/2023 1359   EOSABS 0.3 05/20/2023 1121   BASOSABS 0.1 11/26/2023 1359   BASOSABS 0.1 05/20/2023 1121   Absolute eos 05/20/23 - 300     Assessment & Plan:   Discussion: 68 year old female negligible smoking history with complex sleep apnea not on CPAP, HTN, atrial tachycardia/palpitations, secondary polycythemia vera with occasional phlebotomy, CKD III,  DM2, hx aneurysms s/p coil and clips, legally blind, morbid obesity on ozempic, fibromylagia, GERD, chronic constipation who presents for pulmonary management. Shortness of breath likely related to uncontrolled complex OSA. Prior PFTs reviewed with possible restrictive defect. Reported partial benefit with bronchodilators. Will evaluate with PFTs to evaluate for underlying obstructive lung disease   Shortness of breath Possible asthma Possible restrictive defect --ORDER pulmonary function test --CONTINUE Breztri TWO puffs in the morning and night. STOP this two days before the breathing test  OSA not on CPAP --Unable to tolerate CPAP device --Encourage to continue attempts to wear CPAP as the risk of comorbidities including heart attack and stroke increase without treatment for complex OSA --Order ONO on room air  Health Maintenance Immunization History  Administered Date(s) Administered   Influenza, Quadrivalent, Recombinant, Inj, Pf 07/30/2019   Influenza,inj,Quad PF,6+ Mos 07/18/2018, 07/28/2021   Influenza-Unspecified 07/30/2019, 09/14/2020   PFIZER(Purple Top)SARS-COV-2 Vaccination 02/03/2020, 02/24/2020, 09/15/2020, 04/05/2021   Pfizer Covid-19 Vaccine Bivalent Booster 55yrs & up 07/09/2022   Respiratory Syncytial Virus Vaccine,Recomb Aduvanted(Arexvy) 07/16/2022   Tdap 01/24/2016   Zoster Recombinant(Shingrix) 01/23/2022, 04/24/2022    Orders Placed This Encounter  Procedures   Pulse oximetry, overnight    On room air    Standing Status:   Future    Expiration Date:   12/12/2024   Pulmonary function test    Standing Status:   Future    Expiration Date:   12/12/2024    Where should this test be performed?:   [facility]    Full PFT: includes the following: basic spirometry, spirometry pre & post bronchodilator, diffusion capacity (DLCO), lung volumes:   Full PFT  No orders of the defined types were placed in this encounter.   Return for March , after PFT.  I have  spent a total time of 45-minutes on the day of the appointment reviewing prior documentation, coordinating care and discussing medical diagnosis and plan with the patient/family. Imaging, labs and tests included in this note have been reviewed and interpreted independently by me.  Glenroy Crossen Mechele Collin, MD [facility] 12/13/2023 4:24 PM

## 2023-12-14 ENCOUNTER — Other Ambulatory Visit (HOSPITAL_BASED_OUTPATIENT_CLINIC_OR_DEPARTMENT_OTHER): Payer: Self-pay | Admitting: Family

## 2023-12-14 DIAGNOSIS — R002 Palpitations: Secondary | ICD-10-CM

## 2023-12-19 ENCOUNTER — Ambulatory Visit: Payer: Medicare HMO | Admitting: Nurse Practitioner

## 2023-12-24 ENCOUNTER — Encounter (HOSPITAL_BASED_OUTPATIENT_CLINIC_OR_DEPARTMENT_OTHER): Payer: Self-pay | Admitting: Pulmonary Disease

## 2023-12-26 DIAGNOSIS — R0902 Hypoxemia: Secondary | ICD-10-CM | POA: Diagnosis not present

## 2023-12-26 DIAGNOSIS — G4730 Sleep apnea, unspecified: Secondary | ICD-10-CM | POA: Diagnosis not present

## 2023-12-27 ENCOUNTER — Ambulatory Visit: Payer: Medicare HMO | Admitting: Student

## 2023-12-31 ENCOUNTER — Encounter: Payer: Self-pay | Admitting: Student

## 2023-12-31 ENCOUNTER — Ambulatory Visit (INDEPENDENT_AMBULATORY_CARE_PROVIDER_SITE_OTHER): Payer: Medicare HMO | Admitting: Student

## 2023-12-31 VITALS — BP 136/65 | HR 77 | Ht 61.0 in | Wt 147.0 lb

## 2023-12-31 DIAGNOSIS — Z794 Long term (current) use of insulin: Secondary | ICD-10-CM

## 2023-12-31 DIAGNOSIS — E1142 Type 2 diabetes mellitus with diabetic polyneuropathy: Secondary | ICD-10-CM | POA: Diagnosis not present

## 2023-12-31 LAB — POCT GLYCOSYLATED HEMOGLOBIN (HGB A1C): HbA1c, POC (controlled diabetic range): 8.3 % — AB (ref 0.0–7.0)

## 2023-12-31 NOTE — Patient Instructions (Signed)
It was great to see you today!   Today we addressed: Blood sugar: when you get your next set of steroid shots please come to the clinic to get help with titrating your insulin.   Future Appointments  Date Time Provider Department Center  02/05/2024 10:30 AM [facility] PFT ROOM [facility]  02/05/2024 11:15 AM Luciano Cutter, MD [facility]  02/24/2024  2:00 PM [facility] LAB [facility] None  02/24/2024  2:45 PM [facility] INFUSION [facility] None  05/25/2024  1:30 PM [facility] LAB [facility] None  05/25/2024  2:00 PM Serena Croissant, MD [facility] None  05/25/2024  2:45 PM [facility] INFUSION [facility] None  06/05/2024  2:30 PM [facility] ANNUAL WELLNESS VISIT [facility]    Please arrive 15 minutes before your appointment to ensure smooth check in process.    Please call the clinic at [phone] if your symptoms worsen or you have any concerns.  Thank you for allowing me to participate in your care, Dr. Glendale Chard [facility]

## 2023-12-31 NOTE — Assessment & Plan Note (Signed)
A1c elevated today but can be attributed to steroid injections. Discussed continuing current dosage of medication. If patient gets another round of steroid injections she will need another appointment to discuss how she titrate her insulin.

## 2023-12-31 NOTE — Progress Notes (Addendum)
    SUBJECTIVE:   CHIEF COMPLAINT / HPI:   Cassandra Reid is a 68 y.o. female  presenting for follow up of diabetes.   Type 2 Diabetes: Home medications include: Lipitor 40 mg, Farxiga 10 mg, Tresiba 50 units daily, Aspart 14-16 units with breakfast, 12-14 units with lunch, 10-12 units with dinner, Semaglutide 2 mg weekly. Does endorse compliance. Home glucose monitoring is performed regularly. She reports having steroid shots administered in her back which increased her glucose for 2 weeks. She reports her sugars were in the 400s. She denies nausea and vomiting. Reports she has gotten back to normal.   Most recent A1Cs:  Lab Results  Component Value Date   HGBA1C 8.3 (A) 12/31/2023   HGBA1C 6.5 03/18/2023   Last Microalbumin, LDL, Creatinine: Lab Results  Component Value Date   MICROALBUR 150 11/10/2020   LDLCALC 28 10/11/2023   CREATININE 1.07 (H) 09/26/2023    Patient is up to date on diabetic eye. Patient is up to date on diabetic foot exam.  PERTINENT  PMH / PSH: Reviewed and updated   OBJECTIVE:   BP 136/65   Pulse 77   Ht 5\' 1"  (1.549 m)   Wt 147 lb (66.7 kg)   SpO2 98%   BMI 27.78 kg/m   Well-appearing, no acute distress Cardio: Regular rate, regular rhythm, no murmurs on exam. Pulm: Clear, no wheezing, no crackles. No increased work of breathing Abdominal: bowel sounds present, soft, non-tender, non-distended Extremities: no peripheral edema  Neuro: alert and oriented x3, speech normal in content, no facial asymmetry, strength intact and equal bilaterally in UE and LE, pupils equal and reactive to light.  Psych:  Cognition and judgment appear intact. Alert, communicative  and cooperative with normal attention span and concentration. No apparent delusions, illusions, hallucinations      12/31/2023   11:05 AM 10/15/2023    1:47 PM 06/17/2023    9:28 AM  PHQ9 SCORE ONLY  PHQ-9 Total Score 12 6 10       ASSESSMENT/PLAN:   Type 2 diabetes mellitus with  diabetic polyneuropathy, with long-term current use of insulin A1c elevated today but can be attributed to steroid injections. Discussed continuing current dosage of medication. If patient gets another round of steroid injections she will need another appointment to discuss how she titrate her insulin.    Positive #9 PHQ9:  Patient reports that she has passive thoughts of hurting herself. But denies active thoughts. She reports that she would never do this to her kids and that she believes suicide is a sin.  She does not have an active plan to hurt herself.   Glendale Chard, DO [facility]

## 2024-01-01 LAB — MICROALBUMIN / CREATININE URINE RATIO
Creatinine, Urine: 61.9 mg/dL
Microalb/Creat Ratio: 12 mg/g{creat} (ref 0–29)
Microalbumin, Urine: 7.6 ug/mL

## 2024-01-02 ENCOUNTER — Encounter (HOSPITAL_BASED_OUTPATIENT_CLINIC_OR_DEPARTMENT_OTHER): Payer: Self-pay | Admitting: Pulmonary Disease

## 2024-01-02 ENCOUNTER — Telehealth (HOSPITAL_BASED_OUTPATIENT_CLINIC_OR_DEPARTMENT_OTHER): Payer: Self-pay | Admitting: Pulmonary Disease

## 2024-01-02 ENCOUNTER — Telehealth: Payer: Self-pay | Admitting: Student

## 2024-01-02 DIAGNOSIS — G4739 Other sleep apnea: Secondary | ICD-10-CM

## 2024-01-02 DIAGNOSIS — G4734 Idiopathic sleep related nonobstructive alveolar hypoventilation: Secondary | ICD-10-CM

## 2024-01-02 NOTE — Telephone Encounter (Signed)
Overnight Oximetry Results Date: 12/26/23 SpO2 <88% 2 hour 58 min 17 sec. On CPAP on RA [place]ir SpO2 59%  Qualified for oxygen  Assessment/Plan  OSA with Nocturnal Hypoxemia --START 2L O2 via [place] nightly. This will need to be bled into the CPAP patient --Oxygen new start --DME: [employer]

## 2024-01-02 NOTE — Telephone Encounter (Signed)
Order sent.

## 2024-01-02 NOTE — Telephone Encounter (Signed)
Reviewed paperwork and placed in PCP's box for completion.  Glennie Hawk, CMA'

## 2024-01-03 NOTE — Telephone Encounter (Signed)
Please advise

## 2024-01-03 NOTE — Telephone Encounter (Addendum)
Please hold on titrated sleep study.  Message sent to patient on 01/03/24 via mychart discussing reason needed for titrated sleep study. Will await her response to proceed  Close encounter.

## 2024-01-03 NOTE — Addendum Note (Signed)
Addended by: Luciano Cutter on: 01/03/2024 04:20 PM   Modules accepted: Orders

## 2024-01-03 NOTE — Telephone Encounter (Signed)
Cassandra Reid; Cassandra Reid; Cassandra Reid Since the PT has sleep apnea we will need a titrated sleep study showing where the pt still de sats while on pap therapy, We will also need the O2 order put on the O2 temple, please.  Thank you

## 2024-01-06 DIAGNOSIS — E1142 Type 2 diabetes mellitus with diabetic polyneuropathy: Secondary | ICD-10-CM | POA: Diagnosis not present

## 2024-01-07 NOTE — Telephone Encounter (Signed)
 Form placed up front for pick up.   Copy made for batch scanning.   Patient aware.

## 2024-01-08 ENCOUNTER — Encounter: Payer: Self-pay | Admitting: Student

## 2024-01-27 ENCOUNTER — Telehealth: Payer: Self-pay | Admitting: Neurology

## 2024-01-27 NOTE — Telephone Encounter (Signed)
 Pt called in wanting to see if Dr. Everlena Cooper needs to check her clips that are in her head?

## 2024-01-27 NOTE — Telephone Encounter (Signed)
 It was an aneursym clip done surgery 20 years ago per Dr.Jaffe no need at this time, she is going to follow up as scheduled.

## 2024-01-28 ENCOUNTER — Other Ambulatory Visit: Payer: Self-pay | Admitting: Family Medicine

## 2024-01-28 ENCOUNTER — Encounter: Payer: Self-pay | Admitting: Pulmonary Disease

## 2024-01-28 DIAGNOSIS — H6992 Unspecified Eustachian tube disorder, left ear: Secondary | ICD-10-CM

## 2024-01-28 NOTE — Progress Notes (Addendum)
 NEUROLOGY FOLLOW UP OFFICE NOTE  Cassandra Reid [id number]  Assessment/Plan:   Cerebral aneurysms with history of clippings Benign paroxsymal positional vertigo, bilateral; cervicogenic dizziness is also possible.     CTA head Refer to vestibular rehab Further recommendations pending results.  Total time spent in chart and face to face with patient:  32 minutes.  03/01/2024 ADDENDUM:  Patient went to vestibular rehab.  Testing consistent with BPPV.  She responded to Epley maneuver with resolution.  CTA of head on 02/20/2024 revealed previous pterional craniotomy on the left with carotid stent and aneurysm colils and adjacent surgical aneurysm clips but no evidence of residual aneurysm flow (although limited due to artifact).  3 x 3.4 mm aneurysm (previously felt to be infundibulum) seen at the right posterior communicating location, stable compared to prior imaging from 2020 and 2021.  As imaging has been stable over last 5 years, will have staff relay to patient recommendation for repeat CTA of head in 5 years.    Janne Members, DO  Subjective:  Cassandra Reid is a 68 year old female with chronic kidney disease, diabetes, hypertension, fibromyalgia, and history of cerebral aneurysm status post coiling who follows up for cerebral aneurysm  UPDATE: Presents for follow up regarding cerebral aneurysm.   For the past two months, she has had daily dizziness.  It occurs when she stands up or moves her head.  It is a spinning sensation that lasts just a few seconds.  On her feet, it may make her feel unsteady.  She had a similar episode in the past which responded to vestibular rehab.  She does have degenerative disc disease of the cervical spine that causes chronic neck pain.     HISTORY: She had clipping and then coiling for non-ruptured aneurysm x2 around 2005.  She had a recurrent image several years later.  She reports that they saw another aneurysm but nothing that yet required intervention.      MRA of head from 12/15/2018 personally reviewed and showed artifact from aneurysm clipping obscuring the left circle-of-Willis, as well as a probable 2 x 4 mm supraclinoid right ICA infundibulum (rather than aneurysm).  MRI and MRA of head on 08/25/2020 showed few small remote lacunar infarcts in the right caudated and left internal capsule as well as postoperative changes from previous left pterional craniotomy with aneurysm clip at level of left ICA terminus but no acute intracranial abnormality or new/recurrence aneurysm.    PAST MEDICAL HISTORY: Past Medical History:  Diagnosis Date   Cerebral aneurysm without rupture    CKD stage 3 due to type 2 diabetes mellitus 04/15/2019   DDD (degenerative disc disease), cervical 04/16/2019   Encephalomalacia on imaging study 08/25/2020   Brain MRI 08/25/21: Few scattered areas of encephalomalacia and gliosis within the overlying left frontal lobe most likely postoperative in nature.   Fatty liver 05/31/2020   Generalized anxiety disorder    GERD (gastroesophageal reflux disease)    Hyperlipidemia associated with type 2 diabetes mellitus 07/18/2018   Hypertension associated with diabetes 07/18/2018   Incontinence 09/22/2021   Bowel and bladder.    Legally blind    Major depressive disorder    Osteoporosis 01/18/2021   Paroxysmal atrial tachycardia (HCC)    On telemetry   Type 2 diabetes mellitus with diabetic polyneuropathy, with long-term current use of insulin 07/18/2018   has dexcom G7  - sensor left upper arm    MEDICATIONS: Current Outpatient Medications on File Prior to Visit  Medication  Sig Dispense Refill   albuterol (VENTOLIN HFA) 108 (90 Base) MCG/ACT inhaler INHALE 2 PUFFS INTO THE LUNGS 2 (TWO) TIMES DAILY. PLUS 2 PUFFS AS NEEDED 18 each 1   amLODipine (NORVASC) 5 MG tablet TAKE 1 TABLET EVERY DAY 90 tablet 3   Ascorbic Acid (VITAMIN C ADULT GUMMIES PO) Take 1 each by mouth daily in the afternoon.     atorvastatin (LIPITOR) 40  MG tablet Take 40 mg by mouth daily.     BD PEN NEEDLE NANO 2ND GEN 32G X 4 MM MISC USE WITH INSULIN 100 each 4   blood glucose meter kit and supplies Dispense based on patient and insurance preference. Use up to four times daily as directed. (FOR ICD-10 E10.9, E11.9). 1 each 0   Budeson-Glycopyrrol-Formoterol (BREZTRI AEROSPHERE) 160-9-4.8 MCG/ACT AERO Inhale 2 puffs into the lungs in the morning and at bedtime.     Continuous Blood Gluc Sensor (DEXCOM G6 SENSOR) MISC Use as directed to check blood sugar. 4 each 11   Continuous Blood Gluc Transmit (DEXCOM G6 TRANSMITTER) MISC Use as directed to check blood sugar. 1 each 12   dapagliflozin propanediol (FARXIGA) 10 MG TABS tablet Take 1 tablet (10 mg total) by mouth daily. 90 tablet 3   DULoxetine (CYMBALTA) 60 MG capsule TAKE 1 CAPSULE EVERY DAY 90 capsule 3   ezetimibe (ZETIA) 10 MG tablet TAKE 1 TABLET EVERY DAY 90 tablet 3   famotidine (PEPCID) 20 MG tablet Take 1 tablet (20 mg total) by mouth daily. 90 tablet 3   FIBER SELECT GUMMIES PO Take 1 tablet by mouth daily in the afternoon.     fluticasone (FLONASE) 50 MCG/ACT nasal spray Place 1 spray into both nostrils daily as needed for allergies or rhinitis. 9.9 mL 1   fluticasone furoate-vilanterol (BREO ELLIPTA) 200-25 MCG/ACT AEPB Inhale 1 puff into the lungs daily. 60 each 3   furosemide (LASIX) 40 MG tablet Take 1 tablet (40 mg total) by mouth as needed for edema.     gabapentin (NEURONTIN) 600 MG tablet TAKE 0.5-1 TABLET (300-600 MG TOTAL) BY MOUTH 3 (THREE) TIMES DAILY. (Patient taking differently: Take 600 mg by mouth daily.) 270 tablet 3   insulin aspart (FIASP) 100 UNIT/ML FlexTouch Pen Inject 14-16 Units into the skin daily with breakfast AND 12-14 Units daily with lunch AND 10-12 Units daily with supper. This is a patient assistance medication. Patient may not be approved and/or have medication. Please ask and verify when performing med review.. 15 mL 3   insulin degludec (TRESIBA) 100  UNIT/ML FlexTouch Pen Inject 50 Units into the skin daily. This is a patient assistance medication. Patient may not be approved and/or have medication. Please ask and verify when performing med review. 15 mL 3   lidocaine (LIDODERM) 5 % Place 1 patch onto the skin daily. Remove & Discard patch within 12 hours or as directed by MD 30 patch 0   lisinopril (ZESTRIL) 40 MG tablet TAKE 1 TABLET EVERY DAY 90 tablet 3   loratadine (CLARITIN) 10 MG tablet TAKE 1 TABLET EVERY DAY 90 tablet 3   LORazepam (ATIVAN) 0.5 MG tablet Take 1 tablet (0.5 mg total) by mouth every 8 (eight) hours as needed for anxiety. 90 tablet 0   Magnesium 500 MG TABS Take 500 mg by mouth daily in the afternoon.     metoprolol succinate (TOPROL-XL) 50 MG 24 hr tablet TAKE 1 TABLET (50 MG TOTAL) BY MOUTH DAILY. TAKE WITH OR IMMEDIATELY FOLLOWING A MEAL. 90  tablet 3   Multiple Vitamin (MULTIVITAMIN WITH MINERALS) TABS tablet Take 1 tablet by mouth in the morning.     OneTouch Delica Lancets 33G MISC Please use to check blood sugar up to 4 times daily. E11.42 100 each 12   Oyster Shell (OYSTER CALCIUM) 500 MG TABS tablet Take 500 mg of elemental calcium by mouth daily in the afternoon.     pantoprazole (PROTONIX) 40 MG tablet TAKE 1 TABLET EVERY DAY 90 tablet 3   potassium chloride SA (KLOR-CON M) 20 MEQ tablet TAKE 1 TABLET AS NEEDED (TAKE AS DIRECTED WITH LASIX AS NEEDED FOR EDEMA) 90 tablet 3   Semaglutide, 2 MG/DOSE, (OZEMPIC, 2 MG/DOSE,) 8 MG/3ML SOPN Inject 2 mg into the skin once a week. 9 mL 3   traZODone (DESYREL) 50 MG tablet TAKE 1/2 TABLET BY MOUTH AT BEDTIME AS NEEDED FOR SLEEP. 45 tablet 1   TRUE METRIX BLOOD GLUCOSE TEST test strip TEST BLOOD SUGAR AS INSTRUCTED 350 strip 3   No current facility-administered medications on file prior to visit.    ALLERGIES: No Known Allergies  FAMILY HISTORY: Family History  Problem Relation Age of Onset   Alcohol abuse Mother    Diabetes Mother    Hypertension Mother    Kidney  disease Mother    COPD Father    Diabetes Father    Early death Father    Heart disease Father    Hyperlipidemia Father    Hypertension Father    Diabetes Sister    Hypertension Sister    Breast cancer Sister    Diabetes Sister    Hypertension Sister    Kidney disease Sister    Cancer Maternal Grandmother    Alcohol abuse Maternal Grandfather    Cancer Maternal Grandfather    Cancer Paternal Grandmother    Cancer Paternal Grandfather       Objective:  Blood pressure 117/73, pulse 80, height 5\' 1"  (1.549 m), weight 176 lb 3.2 oz (79.9 kg), SpO2 96%. General: No acute distress.  Patient appears well-groomed.   Head:  Normocephalic/atraumatic Eyes:  Fundi examined but not visualized Heart:  Regular rate and rhythm Neurological Exam: alert and oriented.  Speech fluent and not dysarthric, language intact.  CN II-XII intact. Bulk and tone normal, muscle strength 5/5 throughout.  Sensation to light touch intact.  Deep tendon reflexes 2+ throughout, toes downgoing.  Sensation to pinprick and vibration minimally reduced in toes.  Finger to nose testing intact.  Gait cautious, Romberg with mild sway.  Head Impulse Test positive bilaterally.   Janne Members, DO  CC: Clem Currier, DO

## 2024-01-29 ENCOUNTER — Encounter: Payer: Self-pay | Admitting: Neurology

## 2024-01-29 ENCOUNTER — Ambulatory Visit: Admitting: Neurology

## 2024-01-29 VITALS — BP 117/73 | HR 80 | Ht 61.0 in | Wt 176.2 lb

## 2024-01-29 DIAGNOSIS — H8113 Benign paroxysmal vertigo, bilateral: Secondary | ICD-10-CM | POA: Diagnosis not present

## 2024-01-29 DIAGNOSIS — I671 Cerebral aneurysm, nonruptured: Secondary | ICD-10-CM | POA: Diagnosis not present

## 2024-01-29 NOTE — Patient Instructions (Addendum)
 Check CTA of head.  We have sent a referral to [facility] for your MRI and they will call you directly to schedule your appointment. They are located at [address] [facility]. If you need to contact them directly please call [phone].  Refer to vestibular rehab Further recommendations pending results.

## 2024-01-30 NOTE — Therapy (Signed)
 OUTPATIENT PHYSICAL THERAPY VESTIBULAR EVALUATION     Patient Name: Cassandra Reid MRN: [id number] DOB:31-Aug-1956, 68 y.o., female Today's Date: 01/31/2024  END OF SESSION:  PT End of Session - 01/31/24 1359     Visit Number 1    Number of Visits 5    Date for PT Re-Evaluation 03/01/24    Authorization Type [employer] MEDICARE    PT Start Time 1358    PT Stop Time 1440    PT Time Calculation (min) 42 min    Activity Tolerance Patient tolerated treatment well    Behavior During Therapy [facility] for tasks assessed/performed;Anxious   pt less anxious at end of session            Past Medical History:  Diagnosis Date   Cerebral aneurysm without rupture    CKD stage 3 due to type 2 diabetes mellitus 04/15/2019   DDD (degenerative disc disease), cervical 04/16/2019   Encephalomalacia on imaging study 08/25/2020   Brain MRI 08/25/21: Few scattered areas of encephalomalacia and gliosis within the overlying left frontal lobe most likely postoperative in nature.   Fatty liver 05/31/2020   Generalized anxiety disorder    GERD (gastroesophageal reflux disease)    Hyperlipidemia associated with type 2 diabetes mellitus 07/18/2018   Hypertension associated with diabetes 07/18/2018   Incontinence 09/22/2021   Bowel and bladder.    Legally blind    Major depressive disorder    Osteoporosis 01/18/2021   Paroxysmal atrial tachycardia (HCC)    On telemetry   Type 2 diabetes mellitus with diabetic polyneuropathy, with long-term current use of insulin 07/18/2018   has dexcom G7  - sensor left upper arm   Past Surgical History:  Procedure Laterality Date   ABDOMINAL HYSTERECTOMY  2012   APPENDECTOMY  1972   CEREBRAL ANEURYSM REPAIR Left 2004   Aneurysm clip in place near the level of the left ICA on Brain MRI 08/2020. Also coiling performed   CHOLECYSTECTOMY  2015   COLONOSCOPY  11/24/2020   in [facility]   CRANIOTOMY Left    left pterional craniotomy   ETHMOIDECTOMY Left 11/23/2022    Procedure: LEFT ANTERIOR ETHMOIDECTOMY;  Surgeon: Laren Boom, DO;  Location: [facility] OR;  Service: ENT;  Laterality: Left;   EYE SURGERY  2023   MAXILLARY ANTROSTOMY Left 11/23/2022   Procedure: MAXILLARY ANTROSTOMY;  Surgeon: Laren Boom, DO;  Location: [facility] OR;  Service: ENT;  Laterality: Left;   MENISCUS REPAIR Bilateral    NASAL TURBINATE REDUCTION Bilateral 11/23/2022   Procedure: Bilateral turbinate reduction;  Surgeon: Laren Boom, DO;  Location: [facility] OR;  Service: ENT;  Laterality: Bilateral;   SINUS ENDO W/FUSION Left 11/23/2022   Procedure: Functional endoscopic sinus surgery with navigation.;  Surgeon: Laren Boom, DO;  Location: [facility] OR;  Service: ENT;  Laterality: Left;   TUBAL LIGATION  1990   UPPER GI ENDOSCOPY  11/24/2020   in [facility]   Patient Active Problem List   Diagnosis Date Noted   Restrictive lung disease 03/19/2023   Polycythemia, secondary 03/19/2023   Chronic sinusitis 11/23/2022   Paroxysmal atrial tachycardia 11/24/2021   Cerebral aneurysm without rupture    Major depressive disorder    Generalized anxiety disorder    Incontinence 09/22/2021   Obesity (BMI 30.0-34.9) 05/25/2021   Osteoporosis 01/18/2021   Fatty liver 05/31/2020   Myofascial pain dysfunction syndrome 04/23/2019   DDD (degenerative disc disease), cervical 04/16/2019   CKD stage 3 due to type 2 diabetes mellitus  04/15/2019   Hypertension associated with diabetes 07/18/2018   GERD (gastroesophageal reflux disease) 07/18/2018   Complex sleep apnea syndrome 07/18/2018   Type 2 diabetes mellitus with diabetic polyneuropathy, with long-term current use of insulin 07/18/2018   HLD, secondary prevention, LDL goal <70 07/18/2018   Legally blind 09/01/2003    PCP: Glendale Chard, DO  REFERRING PROVIDER: Drema Dallas, DO    REFERRING DIAG: [phone] (ICD-10-CM) - Benign paroxysmal positional vertigo due to bilateral vestibular disorder   THERAPY DIAG:  BPPV (benign  paroxysmal positional vertigo), right  Unsteadiness on feet  Dizziness and giddiness  ONSET DATE: 01/29/2024   Rationale for Evaluation and Treatment: Rehabilitation  SUBJECTIVE:   SUBJECTIVE STATEMENT: Has been having dizziness since about a year ago. Off and on, but happening more often now. Standing up will make her dizzy and have to hold onto something for a bit. Sometimes sitting on the couch will make her start to spin and has to hold onto something. Otherwise can't think of certain movements that make her dizzy. Was here in 2023 for BPPV, not sure if this is the same. Falls a lot. Has a quad cane, but tries not to use it. Falls have happened when walking around the house, has lost her balance or falls over to one side. Lives in a small apartment with carpet and can fall onto furniture. Has some neck issues  Pt accompanied by: self  PERTINENT HISTORY: PMH: chronic kidney disease, diabetes, hypertension, fibromyalgia, and history of cerebral aneurysm status post coiling, OSA, anxiety, OTA, HLD, legally blind, lacunar stroke  Hx of R BPPV (treated at this clinic in 2023)   Per Dr. Everlena Cooper: For the past two months, she has had daily dizziness. It occurs when she stands up or moves her head. It is a spinning sensation that lasts just a few seconds. On her feet, it may make her feel unsteady. She had a similar episode in the past which responded to vestibular rehab. She does have degenerative disc disease of the cervical spine that causes chronic neck pain.   PAIN:  Are you having pain? Notes neck is bothering her a little bit, but not as bad as it could be  Vitals:   01/31/24 1414  BP: 137/70  Pulse: 82     PRECAUTIONS: Fall   FALLS: Has patient fallen in last 6 months? Yes. Number of falls 15  LIVING ENVIRONMENT: Lives with: lives alone Lives in: House/apartment - apartment  Stairs: lives on 2nd floor, holds on with 2 hands  Has following equipment at home:  Reports has a  quad cane, tries not to use it, has a shower chair  PLOF: Independent No driving, uses [employer] Goad if pt needs to leave the house   PATIENT GOALS: Wants to not bed dizzy, be more confident with going out of the house, wants to be able to go into a store   OBJECTIVE:  Note: Objective measures were completed at Evaluation unless otherwise noted.  DIAGNOSTIC FINDINGS: MRI cervical spine 08/2023:  IMPRESSION: 1. Progressive degenerative disc disease and facet disease at C5-6 with mild spinal stenosis and moderate bilateral foraminal stenosis. There is also a persistent small left paracentral disc protrusion with mass effect on the left side of the thecal sac and contributing to medial foraminal stenosis on that side. 2. Slightly progressive mild spinal and mild to moderate bilateral foraminal stenosis at C6-7.  COGNITION: Overall cognitive status: Within functional limits for tasks assessed   POSTURE:  rounded  shoulders, forward head, increased thoracic kyphosis, posterior pelvic tilt, and shoulders elevated   Cervical ROM:   Guarded head movements due to pt feeling uncomfortable, has incr shoulder elevation    GAIT: Gait pattern: step through pattern, decreased stride length, and wide BOS Distance walked: Clinic distances  Assistive device utilized: None Level of assistance: Modified independence Comments: Initially more guarded when ambulating into clinic  VESTIBULAR ASSESSMENT:  GENERAL OBSERVATION: Ambulates in with no AD, very guarded with shoulders elevated.    SYMPTOM BEHAVIOR:  Subjective history: See above   Non-Vestibular symptoms: neck pain, headaches, nausea/vomiting, and reports nausea/vomiting is due to the Ozempic rather than dizziness   Type of dizziness: Spinning/Vertigo, Lightheadedness/Faint, and "feeling like she might pass out"   Frequency: Some times it could be 5-6 times a day or another day it might not be as much   Duration: Not long, once she is able to  hold onto something maybe about a minute   Aggravating factors: Induced by position change: lying supine and sit to stand and taking a shower  Relieving factors:  Grab onto something, it feels like she won't fall off the earth   Progression of symptoms: worse, having more frequent episodes   OCULOMOTOR EXAM:  Ocular Alignment: normal  Ocular ROM: No Limitations  Spontaneous Nystagmus: absent  Gaze-Induced Nystagmus: absent  Smooth Pursuits: intact  Saccades: hypometric/undershoots, ~2-3 saccadic beats in vertical direction, pt reporting feeling a little werid    VESTIBULAR - OCULAR REFLEX:   Slow VOR: Normal and Comment: feels uncomfortable but not dizzy   VOR Cancellation: Normal, a little scary, pt needing to hold onto chair     POSITIONAL TESTING: Right Dix-Hallpike: upbeating, right nystagmus and ~10 second delayed onset, very mild nystagmus lasting approx. 10-15 seconds, pt grabbing out onto mat table due to room spinning                                                                                                                              TREATMENT DATE: 01/31/24   Canalith Repositioning:  Epley Right: Number of Reps: 1, Response to Treatment: symptoms resolved, and Comment: re-assessed with R DixHallpike and R sidelying position, no nystagmus noted. Pt reporting feeling better and less nervous at end of session knowing that she had BPPV and it was treatable (also had R BPPV in 2023)    PATIENT EDUCATION: Education details: BPPV education, etiology, and how to treat it, how pt's anxiety can also be related to her dizziness, PT POC (pt has to [place] here, so only want to schedule one appt at a time), provided handout of BPPV Person educated: Patient Education method: Explanation, Demonstration, Verbal cues, and Handouts Education comprehension: verbalized understanding and returned demonstration  HOME EXERCISE PROGRAM: Will provide at future session as needed    GOALS: Goals reviewed with patient? Yes  SHORT TERM GOALS: ALL STGS = LTGS  LONG TERM GOALS: Target date: 02/28/2024  Pt will  demo negative positional testing in order to decr dizziness.  Baseline: positive for R posterior canal BPPV  Goal status: INITIAL   ASSESSMENT:  CLINICAL IMPRESSION: Patient is a 68 year old female referred to Neuro OPPT for BPPV.   Pt's PMH is significant for:  chronic kidney disease, diabetes, hypertension, fibromyalgia, and history of cerebral aneurysm status post coiling, OSA, anxiety, OTA, HLD, legally blind, lacunar stroke. Pt also seen at this clinic in 2023 for R BPPV. The following deficits were present during the exam: R upbeating rotary nystagmus in R DixHallpike (delayed onset and very mild amplitude lasting 10-15 seconds). Treated with 1 rep of Epley maneuver and pt demonstrating resolution afterwards. Pt less anxious afterwards knowing that it was BPPV. Due to finances and transportation with [employer], pt only able to schedule one appt at a time. Pt would benefit from skilled PT to address these impairments and functional limitations to maximize functional mobility independence and decr dizziness.    OBJECTIVE IMPAIRMENTS: decreased activity tolerance, decreased balance, decreased mobility, decreased ROM, dizziness, postural dysfunction, and R BPPV .   ACTIVITY LIMITATIONS: carrying, lifting, bending, standing, transfers, bed mobility, and locomotion level  PARTICIPATION LIMITATIONS: driving, shopping, community activity, and yard work  PERSONAL FACTORS: Age, Behavior pattern, Past/current experiences, Time since onset of injury/illness/exacerbation, Transportation, and 3+ comorbidities:  chronic kidney disease, diabetes, hypertension, fibromyalgia, and history of cerebral aneurysm status post coiling, OSA, anxiety, OTA, HLD, legally blind, lacunar stroke  are also affecting patient's functional outcome.   REHAB POTENTIAL: Fair scheduling each session  one at a time due to transportation/financial concerns   CLINICAL DECISION MAKING: Evolving/moderate complexity  EVALUATION COMPLEXITY: Moderate   PLAN:  PT FREQUENCY: 1x/week  PT DURATION: 4 weeks  PLANNED INTERVENTIONS: 97164- PT Re-evaluation, 97110-Therapeutic exercises, 97530- Therapeutic activity, 97112- Neuromuscular re-education, 97535- Self Care, 59563- Manual therapy, [phone]- Canalith repositioning, Patient/Family education, Balance training, and Vestibular training  PLAN FOR NEXT SESSION: Re-assess R posterior canal BPPV and treat, check for L BPPV as needed    Drake Leach, PT, DPT 01/31/2024, 2:59 PM

## 2024-01-31 ENCOUNTER — Encounter: Payer: Self-pay | Admitting: Physical Therapy

## 2024-01-31 ENCOUNTER — Telehealth: Payer: Self-pay | Admitting: Nurse Practitioner

## 2024-01-31 ENCOUNTER — Ambulatory Visit: Attending: Neurology | Admitting: Physical Therapy

## 2024-01-31 VITALS — BP 137/70 | HR 82

## 2024-01-31 DIAGNOSIS — H8113 Benign paroxysmal vertigo, bilateral: Secondary | ICD-10-CM | POA: Diagnosis not present

## 2024-01-31 DIAGNOSIS — R42 Dizziness and giddiness: Secondary | ICD-10-CM | POA: Insufficient documentation

## 2024-01-31 DIAGNOSIS — H8111 Benign paroxysmal vertigo, right ear: Secondary | ICD-10-CM | POA: Insufficient documentation

## 2024-01-31 DIAGNOSIS — R2681 Unsteadiness on feet: Secondary | ICD-10-CM | POA: Diagnosis not present

## 2024-01-31 NOTE — Telephone Encounter (Signed)
 Dr. Myrtie Neither, please review 01/02/2021 colonoscopy records filed under media, colonoscopy resulted in a fair prep and two tubular adenomatous polyps were removed from the colon. Pls verify colonoscopy recall date. Thx.

## 2024-02-02 NOTE — Telephone Encounter (Signed)
 Thank you for the update.  With adenomatous polyps and fair colonoscopy prep, she is due for surveillance colonoscopy.  Given the problems for which she saw you in clinic October 2024, it would be best for her to have a follow-up clinic appointment with you to work on her constipation and then schedule her for a colonoscopy with a split dose GoLytely prep.  Ellwood Dense MD

## 2024-02-03 NOTE — Telephone Encounter (Signed)
 DD, pls contact patient and let her know Dr. Myrtie Neither' recommendations below. Pls schedule her for a follow up appt with me and I will order a colonoscopy at that visit. THX.

## 2024-02-03 NOTE — Telephone Encounter (Signed)
 Contacted patient and patient verbalized recommendations from Dr.Danis & patient has been scheduled to see Colleen on 4/29 at 2 pm

## 2024-02-04 ENCOUNTER — Telehealth: Payer: Self-pay | Admitting: Student

## 2024-02-04 ENCOUNTER — Encounter (HOSPITAL_BASED_OUTPATIENT_CLINIC_OR_DEPARTMENT_OTHER): Payer: Medicare HMO

## 2024-02-04 ENCOUNTER — Ambulatory Visit (HOSPITAL_BASED_OUTPATIENT_CLINIC_OR_DEPARTMENT_OTHER): Payer: Medicare HMO | Admitting: Pulmonary Disease

## 2024-02-04 ENCOUNTER — Encounter: Payer: Self-pay | Admitting: Physical Therapy

## 2024-02-04 ENCOUNTER — Telehealth: Payer: Self-pay | Admitting: Pharmacist

## 2024-02-04 NOTE — Telephone Encounter (Signed)
 Received completed [employer] [place] forms in nurse box.   Called patient to advise of update. She did not answer, LVM.   Will mail to patient's home address per ROI.   Copy made and placed in batch scanning.   Veronda Prude, RN

## 2024-02-04 NOTE — Telephone Encounter (Signed)
 Asked by Dr. Hyacinth Meeker to assist with CGM use documentation. Patient contacted for follow-up of glucose control and use.   Patient reports she is doing well with her Dexcom G7  Reports no lows and slightly higher readings over the last few weeks related to illness and recent steroid injection.   Libre3 CGM Download today 02/04/2024  % Time CGM is active: 99.3% Average Glucose: 190 mg/dL Glucose Management Indicator: 7.9  Glucose Variability: 21.8% (goal <36%) Time in Goal:  - Time in range 70-180: 42% - Time above range: 58% (50% were 180-250) - Time below range: 0%  Current Medications include: None Patient denies any significant medication related side effects.  Medication Plan: - Continue all medications the same.  - Continue progress with diet and exercise  Total time with patient call and documentation of interaction: 12 minutes.  F/U Phone call planned: None

## 2024-02-04 NOTE — Telephone Encounter (Signed)
 Patient walked in stating the nurse/doctor called her saying the form she needed was left at the front desk. Searched the drawer and the looked in the red book and did not see the form.  She has signed the Patient Request for Access to have the form mailed to here home address.  Form scanned to computer

## 2024-02-05 ENCOUNTER — Ambulatory Visit (HOSPITAL_BASED_OUTPATIENT_CLINIC_OR_DEPARTMENT_OTHER): Payer: Medicare HMO | Admitting: Pulmonary Disease

## 2024-02-05 ENCOUNTER — Encounter (HOSPITAL_BASED_OUTPATIENT_CLINIC_OR_DEPARTMENT_OTHER): Payer: Self-pay | Admitting: Pulmonary Disease

## 2024-02-05 VITALS — BP 122/70 | HR 91 | Ht 62.0 in | Wt 173.2 lb

## 2024-02-05 DIAGNOSIS — J4530 Mild persistent asthma, uncomplicated: Secondary | ICD-10-CM | POA: Diagnosis not present

## 2024-02-05 DIAGNOSIS — I129 Hypertensive chronic kidney disease with stage 1 through stage 4 chronic kidney disease, or unspecified chronic kidney disease: Secondary | ICD-10-CM | POA: Diagnosis not present

## 2024-02-05 DIAGNOSIS — D631 Anemia in chronic kidney disease: Secondary | ICD-10-CM | POA: Diagnosis not present

## 2024-02-05 DIAGNOSIS — R0602 Shortness of breath: Secondary | ICD-10-CM | POA: Diagnosis not present

## 2024-02-05 DIAGNOSIS — N183 Chronic kidney disease, stage 3 unspecified: Secondary | ICD-10-CM | POA: Diagnosis not present

## 2024-02-05 DIAGNOSIS — G4733 Obstructive sleep apnea (adult) (pediatric): Secondary | ICD-10-CM

## 2024-02-05 DIAGNOSIS — G4734 Idiopathic sleep related nonobstructive alveolar hypoventilation: Secondary | ICD-10-CM

## 2024-02-05 DIAGNOSIS — N189 Chronic kidney disease, unspecified: Secondary | ICD-10-CM | POA: Diagnosis not present

## 2024-02-05 DIAGNOSIS — N2581 Secondary hyperparathyroidism of renal origin: Secondary | ICD-10-CM | POA: Diagnosis not present

## 2024-02-05 DIAGNOSIS — E1122 Type 2 diabetes mellitus with diabetic chronic kidney disease: Secondary | ICD-10-CM | POA: Diagnosis not present

## 2024-02-05 LAB — PULMONARY FUNCTION TEST
DL/VA % pred: 95 %
DL/VA: 4.05 ml/min/mmHg/L
DLCO cor % pred: 92 %
DLCO cor: 17 ml/min/mmHg
DLCO unc % pred: 96 %
DLCO unc: 17.87 ml/min/mmHg
FEF 25-75 Post: 3.02 L/s
FEF 25-75 Pre: 3.2 L/s
FEF2575-%Change-Post: -5 %
FEF2575-%Pred-Post: 153 %
FEF2575-%Pred-Pre: 162 %
FEV1-%Change-Post: -1 %
FEV1-%Pred-Post: 114 %
FEV1-%Pred-Pre: 116 %
FEV1-Post: 2.53 L
FEV1-Pre: 2.57 L
FEV1FVC-%Change-Post: 0 %
FEV1FVC-%Pred-Pre: 110 %
FEV6-%Change-Post: -1 %
FEV6-%Pred-Post: 106 %
FEV6-%Pred-Pre: 108 %
FEV6-Post: 2.95 L
FEV6-Pre: 3 L
FEV6FVC-%Pred-Post: 104 %
FEV6FVC-%Pred-Pre: 104 %
FVC-%Change-Post: -2 %
FVC-%Pred-Post: 102 %
FVC-%Pred-Pre: 104 %
FVC-Post: 2.95 L
FVC-Pre: 3.02 L
Post FEV1/FVC ratio: 86 %
Post FEV6/FVC ratio: 100 %
Pre FEV1/FVC ratio: 85 %
Pre FEV6/FVC Ratio: 100 %
RV % pred: 118 %
RV: 2.39 L
TLC % pred: 109 %
TLC: 5.2 L

## 2024-02-05 NOTE — Telephone Encounter (Signed)
 Reviewed and agree with Dr Macky Lower plan.

## 2024-02-05 NOTE — Patient Instructions (Signed)
 Mild persistent asthma - well controlled on inhalers --Reviewed pulmonary function test. Normal on bronchodilators --CONTINUE Breztri TWO puffs in the morning and night. Rinse mouth out with use --CONTINUE Albuterol AS NEEDED for shortness of breath or wheezing --Encourage regular aerobic activity up to 20 minutes daily including walking  OSA not on CPAP --Continue wearing CPAP nightly --Scheduled for PAP titration on 03/12/24

## 2024-02-05 NOTE — Progress Notes (Signed)
 Subjective:   PATIENT ID: Cassandra Reid: female DOB: 07/05/1956, MRN: [id number]  Chief Complaint  Patient presents with   Follow-up    Shortness of breath    Reason for Visit: Follow-up. Former Dr. Aldean Ast  [name] is a 68 year old female negligible smoking history with complex sleep apnea not on CPAP, HTN, atrial tachycardia/palpitations, secondary polycythemia vera with occasional phlebotomy, CKD III, DM2, hx aneurysms s/p coil and clips, legally blind, morbid obesity on ozempic, fibromylagia, GERD, chronic constipation who presents for pulmonary management.  Initial consult She was previously seen by Dr. Aldean Ast for OSA but has not been on CPAP for several months. HST on 01/03/22 showed mild complex sleep apnea/hypopnea, AHI 9/hr with nocturnal hypoxia. She reports shortness of breath with walking up steps. Does have coughing at night that began a few weeks ago but this is not a chronic issue. No wheezing. On Breztri for the last three months.   02/05/24 Since our last visit she has titrated sleep study scheduled on 03/12/24. She has been wearing CPAP 3-4 hours nightly. Still having nocturnal awakenings. Maybe improved but still has shortness of breath with activity/stair stepping. She plans to go to the gym and pool. She presents today for PFT follow-up.   Past Medical History:  Diagnosis Date   Cerebral aneurysm without rupture    CKD stage 3 due to type 2 diabetes mellitus 04/15/2019   DDD (degenerative disc disease), cervical 04/16/2019   Encephalomalacia on imaging study 08/25/2020   Brain MRI 08/25/21: Few scattered areas of encephalomalacia and gliosis within the overlying left frontal lobe most likely postoperative in nature.   Fatty liver 05/31/2020   Generalized anxiety disorder    GERD (gastroesophageal reflux disease)    Hyperlipidemia associated with type 2 diabetes mellitus 07/18/2018   Hypertension associated with diabetes 07/18/2018    Incontinence 09/22/2021   Bowel and bladder.    Legally blind    Major depressive disorder    Osteoporosis 01/18/2021   Paroxysmal atrial tachycardia (HCC)    On telemetry   Type 2 diabetes mellitus with diabetic polyneuropathy, with long-term current use of insulin 07/18/2018   has dexcom G7  - sensor left upper arm     Family History  Problem Relation Age of Onset   Alcohol abuse Mother    Diabetes Mother    Hypertension Mother    Kidney disease Mother    COPD Father    Diabetes Father    Early death Father    Heart disease Father    Hyperlipidemia Father    Hypertension Father    Diabetes Sister    Hypertension Sister    Breast cancer Sister    Diabetes Sister    Hypertension Sister    Kidney disease Sister    Cancer Maternal Grandmother    Alcohol abuse Maternal Grandfather    Cancer Maternal Grandfather    Cancer Paternal Grandmother    Cancer Paternal Grandfather      Social History   Occupational History   Occupation: disabled  Tobacco Use   Smoking status: Former    Current packs/day: 0.00    Types: Cigarettes    Quit date: 06/1985    Years since quitting: 38.6    Passive exposure: Past   Smokeless tobacco: Never  Vaping Use   Vaping status: Never Used  Substance and Sexual Activity   Alcohol use: Never   Drug use: Not Currently    Types: Cocaine, Marijuana  Comment: 1976-1980   Sexual activity: Not Currently    Birth control/protection: Surgical    Comment: Hysterectomy    No Known Allergies   Outpatient Medications Prior to Visit  Medication Sig Dispense Refill   albuterol (VENTOLIN HFA) 108 (90 Base) MCG/ACT inhaler INHALE 2 PUFFS INTO THE LUNGS 2 (TWO) TIMES DAILY. PLUS 2 PUFFS AS NEEDED 18 each 1   amLODipine (NORVASC) 5 MG tablet TAKE 1 TABLET EVERY DAY 90 tablet 3   Ascorbic Acid (VITAMIN C ADULT GUMMIES PO) Take 1 each by mouth daily in the afternoon.     atorvastatin (LIPITOR) 40 MG tablet Take 40 mg by mouth daily.     BD PEN  NEEDLE NANO 2ND GEN 32G X 4 MM MISC USE WITH INSULIN 100 each 4   blood glucose meter kit and supplies Dispense based on patient and insurance preference. Use up to four times daily as directed. (FOR ICD-10 E10.9, E11.9). 1 each 0   Budeson-Glycopyrrol-Formoterol (BREZTRI AEROSPHERE) 160-9-4.8 MCG/ACT AERO Inhale 2 puffs into the lungs in the morning and at bedtime.     Continuous Blood Gluc Sensor (DEXCOM G6 SENSOR) MISC Use as directed to check blood sugar. 4 each 11   Continuous Blood Gluc Transmit (DEXCOM G6 TRANSMITTER) MISC Use as directed to check blood sugar. 1 each 12   dapagliflozin propanediol (FARXIGA) 10 MG TABS tablet Take 1 tablet (10 mg total) by mouth daily. 90 tablet 3   DULoxetine (CYMBALTA) 60 MG capsule TAKE 1 CAPSULE EVERY DAY 90 capsule 3   ezetimibe (ZETIA) 10 MG tablet TAKE 1 TABLET EVERY DAY 90 tablet 3   famotidine (PEPCID) 20 MG tablet Take 1 tablet (20 mg total) by mouth daily. 90 tablet 3   FIBER SELECT GUMMIES PO Take 1 tablet by mouth daily in the afternoon.     fluticasone (FLONASE) 50 MCG/ACT nasal spray Place 1 spray into both nostrils daily as needed for allergies or rhinitis. 9.9 mL 1   fluticasone furoate-vilanterol (BREO ELLIPTA) 200-25 MCG/ACT AEPB Inhale 1 puff into the lungs daily. 60 each 3   furosemide (LASIX) 40 MG tablet Take 1 tablet (40 mg total) by mouth as needed for edema.     gabapentin (NEURONTIN) 600 MG tablet TAKE 0.5-1 TABLET (300-600 MG TOTAL) BY MOUTH 3 (THREE) TIMES DAILY. (Patient taking differently: Take 600 mg by mouth daily.) 270 tablet 3   insulin aspart (FIASP) 100 UNIT/ML FlexTouch Pen Inject 14-16 Units into the skin daily with breakfast AND 12-14 Units daily with lunch AND 10-12 Units daily with supper. This is a patient assistance medication. Patient may not be approved and/or have medication. Please ask and verify when performing med review.. 15 mL 3   insulin degludec (TRESIBA) 100 UNIT/ML FlexTouch Pen Inject 50 Units into the  skin daily. This is a patient assistance medication. Patient may not be approved and/or have medication. Please ask and verify when performing med review. 15 mL 3   lidocaine (LIDODERM) 5 % Place 1 patch onto the skin daily. Remove & Discard patch within 12 hours or as directed by MD 30 patch 0   lisinopril (ZESTRIL) 40 MG tablet TAKE 1 TABLET EVERY DAY 90 tablet 3   loratadine (CLARITIN) 10 MG tablet TAKE 1 TABLET EVERY DAY 90 tablet 3   LORazepam (ATIVAN) 0.5 MG tablet Take 1 tablet (0.5 mg total) by mouth every 8 (eight) hours as needed for anxiety. 90 tablet 0   Magnesium 500 MG TABS Take 500 mg by  mouth daily in the afternoon.     metoprolol succinate (TOPROL-XL) 50 MG 24 hr tablet TAKE 1 TABLET (50 MG TOTAL) BY MOUTH DAILY. TAKE WITH OR IMMEDIATELY FOLLOWING A MEAL. 90 tablet 3   Multiple Vitamin (MULTIVITAMIN WITH MINERALS) TABS tablet Take 1 tablet by mouth in the morning.     OneTouch Delica Lancets 33G MISC Please use to check blood sugar up to 4 times daily. E11.42 100 each 12   Oyster Shell (OYSTER CALCIUM) 500 MG TABS tablet Take 500 mg of elemental calcium by mouth daily in the afternoon.     pantoprazole (PROTONIX) 40 MG tablet TAKE 1 TABLET EVERY DAY 90 tablet 3   potassium chloride SA (KLOR-CON M) 20 MEQ tablet TAKE 1 TABLET AS NEEDED (TAKE AS DIRECTED WITH LASIX AS NEEDED FOR EDEMA) 90 tablet 3   Semaglutide, 2 MG/DOSE, (OZEMPIC, 2 MG/DOSE,) 8 MG/3ML SOPN Inject 2 mg into the skin once a week. 9 mL 3   traZODone (DESYREL) 50 MG tablet TAKE 1/2 TABLET BY MOUTH AT BEDTIME AS NEEDED FOR SLEEP. 45 tablet 1   TRUE METRIX BLOOD GLUCOSE TEST test strip TEST BLOOD SUGAR AS INSTRUCTED 350 strip 3   No facility-administered medications prior to visit.    Review of Systems  Constitutional:  Negative for chills, diaphoresis, fever, malaise/fatigue and weight loss.  HENT:  Negative for congestion.   Respiratory:  Positive for shortness of breath. Negative for cough, hemoptysis, sputum  production and wheezing.   Cardiovascular:  Negative for chest pain, palpitations and leg swelling.     Objective:   Vitals:   02/05/24 1127  BP: 122/70  Pulse: 91  SpO2: 95%  Weight: 173 lb 3.2 oz (78.6 kg)  Height: 5\' 2"  (1.575 m)   SpO2: 95 %  Physical Exam: General: Well-appearing, no acute distress HENT: [place], AT Eyes: EOMI, no scleral icterus Respiratory: Clear to auscultation bilaterally.  No crackles, wheezing or rales Cardiovascular: RRR, -M/R/G, no JVD Extremities:-Edema,-tenderness Neuro: AAO x4, CNII-XII grossly intact Psych: Normal mood, normal affect   Data Reviewed:  Imaging: CXR 02/12/23 - No infiltrate effusion or edema  PFT: 02/20/23 Spirometry  FVC 2.07 (73%) FEV1 2.14 (89%) Ratio 92 . Post-bronchodilator increased FVC with 29% change Interpretation: Mildly reduced FVC. No obstructive defect but sig BD response. Consider lung volumes to confirm restriction.  02/05/24 FVC 2.95 (102%) FEV1 2.53 (114%) Ratio 86  TLC 109% DLCO 96% Interpretation: Normal pulmonary function tests on triple therapy   Labs: CBC    Component Value Date/Time   WBC 8.7 11/26/2023 1359   WBC 8.1 02/12/2023 1624   RBC 5.45 (H) 11/26/2023 1359   HGB 15.2 (H) 11/26/2023 1359   HGB 17.1 (H) 05/20/2023 1121   HCT 46.0 11/26/2023 1359   HCT 52.1 (H) 05/20/2023 1121   PLT 198 11/26/2023 1359   PLT 189 05/20/2023 1121   MCV 84.4 11/26/2023 1359   MCV 87 05/20/2023 1121   MCH 27.9 11/26/2023 1359   MCHC 33.0 11/26/2023 1359   RDW 14.6 11/26/2023 1359   RDW 13.4 05/20/2023 1121   LYMPHSABS 1.4 11/26/2023 1359   LYMPHSABS 1.4 05/20/2023 1121   MONOABS 0.6 11/26/2023 1359   EOSABS 0.1 11/26/2023 1359   EOSABS 0.3 05/20/2023 1121   BASOSABS 0.1 11/26/2023 1359   BASOSABS 0.1 05/20/2023 1121   Absolute eos 05/20/23 - 300     Assessment & Plan:   Discussion: 68 year old female negligible smoking history with complex sleep apnea not on  CPAP, HTN, atrial  tachycardia/palpitations, secondary polycythemia vera with occasional phlebotomy, CKD III, DM2, hx aneurysms s/p coil and clips, legally blind, morbid obesity on ozempic, fibromylagia, GERD, chronic constipation who presents for follow-up. Shortness of breath likely related to uncontrolled complex OSA. Prior PFTs reviewed with possible restrictive defect however this has resolved on recent PFTs and on current bronchodilator regimen.   Mild persistent asthma - well controlled on inhalers --Reviewed pulmonary function test. Normal on bronchodilators --CONTINUE Breztri TWO puffs in the morning and night. Rinse mouth out with use --CONTINUE Albuterol AS NEEDED for shortness of breath or wheezing --Encourage regular aerobic activity up to 20 minutes daily including walking  OSA not on CPAP --Continue wearing CPAP nightly --Scheduled for PAP titration on 03/12/24  Health Maintenance Immunization History  Administered Date(s) Administered   Influenza, Quadrivalent, Recombinant, Inj, Pf 07/30/2019   Influenza,inj,Quad PF,6+ Mos 07/18/2018, 07/28/2021   Influenza-Unspecified 07/30/2019, 09/14/2020, 07/22/2023   PFIZER(Purple Top)SARS-COV-2 Vaccination 02/03/2020, 02/24/2020, 09/15/2020, 04/05/2021   Pfizer Covid-19 Vaccine Bivalent Booster 17yrs & up 07/09/2022   Pneumococcal-Unspecified 07/30/2023   Respiratory Syncytial Virus Vaccine,Recomb Aduvanted(Arexvy) 07/16/2022   Tdap 01/24/2016   Unspecified SARS-COV-2 Vaccination 07/22/2023   Zoster Recombinant(Shingrix) 01/23/2022, 04/24/2022    No orders of the defined types were placed in this encounter. No orders of the defined types were placed in this encounter.   Return in about 8 months (around 10/07/2024).  I have spent a total time of 32-minutes on the day of the appointment including chart review, data review, collecting history, coordinating care and discussing medical diagnosis and plan with the patient/family. Past medical history,  allergies, medications were reviewed. Pertinent imaging, labs and tests included in this note have been reviewed and interpreted independently by me.  Leela Vanbrocklin Mechele Collin, MD [facility] 02/05/2024 11:41 AM

## 2024-02-05 NOTE — Patient Instructions (Signed)
 Full PFT Performed Today

## 2024-02-05 NOTE — Progress Notes (Signed)
 Full PFT Performed Today

## 2024-02-06 ENCOUNTER — Ambulatory Visit: Payer: Self-pay | Admitting: Physical Therapy

## 2024-02-06 LAB — LAB REPORT - SCANNED
Albumin, Urine POC: 6.5
Creatinine, POC: 72 mg/dL
EGFR: 59
Microalb Creat Ratio: 9

## 2024-02-07 ENCOUNTER — Other Ambulatory Visit: Payer: Self-pay | Admitting: Family Medicine

## 2024-02-07 ENCOUNTER — Telehealth: Payer: Self-pay | Admitting: Pharmacist

## 2024-02-07 DIAGNOSIS — Z794 Long term (current) use of insulin: Secondary | ICD-10-CM

## 2024-02-07 MED ORDER — INSULIN DEGLUDEC 100 UNIT/ML ~~LOC~~ SOPN: 50.0000 [IU] | PEN_INJECTOR | Freq: Every day | SUBCUTANEOUS | 5 refills | Status: DC

## 2024-02-07 MED ORDER — NOVOFINE PLUS PEN NEEDLE 32G X 4 MM MISC: 1.0000 | 11 refills | Status: DC

## 2024-02-07 MED ORDER — OZEMPIC (2 MG/DOSE) 8 MG/3ML ~~LOC~~ SOPN: 2.0000 mg | PEN_INJECTOR | SUBCUTANEOUS | 11 refills | Status: DC

## 2024-02-07 MED ORDER — INSULIN ASPART (W/NIACINAMIDE) 100 UNIT/ML ~~LOC~~ SOPN: PEN_INJECTOR | SUBCUTANEOUS | 3 refills | Status: DC

## 2024-02-07 NOTE — Telephone Encounter (Signed)
-----   Message from Otho Najjar sent at 02/07/2024 11:41 AM EDT ----- Please send all 3 RX's for a 30 day supply to [facility] on [address], with a note "do not bill insurance". Please include a box of novofine brand pen needles as well. Thanks!!

## 2024-02-07 NOTE — Telephone Encounter (Signed)
 Patient contacted for follow-up of need for new prescriptions sent to pharmacy.   I shared that I had facilitated new prescriptions for  Insulins (2), Ozempic and needle tips to her preferred pharmacy.  Instructed to take her vouchers in to the pharmacy at her convenience for processing.   She thanked me for informing her of completion.  Total time with patient call and documentation of interaction: 22 minutes.

## 2024-02-10 MED ORDER — NOVOFINE PLUS PEN NEEDLE 32G X 4 MM MISC: 1.0000 | 11 refills | Status: AC

## 2024-02-10 NOTE — Telephone Encounter (Signed)
 Reviewed and agree with Dr Macky Lower plan.

## 2024-02-12 ENCOUNTER — Encounter: Payer: Self-pay | Admitting: Neurology

## 2024-02-13 ENCOUNTER — Telehealth: Payer: Self-pay

## 2024-02-13 ENCOUNTER — Encounter: Payer: Self-pay | Admitting: Pharmacist

## 2024-02-13 NOTE — Telephone Encounter (Signed)
 [facility] shipment has arrived. 2 out of 3 meds received.   Ozempic 2mg  dose pens (4 month supply) Tresiba U100 (6 boxes)  Wanted to confirm her Evaristo Bury dose before reaching out for her to pickup. She is taking 50 units once daily?   Also recent RX for Lantus sent to pharmacy was not an eligible prescription to be used with the voucher given from company (see 01/08/24 patient messages). Vouchers were for [employer], and Ozempic. However it can be disregarded now since [place] is available for pickup at office :)

## 2024-02-14 NOTE — Telephone Encounter (Signed)
 Patient returned call to discuss MAP delivery/pick-up.   Patient reports she was able to pick-up [place], [place] and [employer] provided recently.  She currently has supply of all medications.   She also shared that she has used syringes in the past to inject from a vial.  The delivery of vials for use was concerning to me for her current shipment.  However, patient was willing and expressed no concern over using Tresiba in vial form for her injections.  She will pick-up her most recently delivered supply of medications from [facility] in the next few weeks.   Additionally, she reported that at a recent pulmonology visit, she was told by her pulmonologist that her breathing was doing much better than in the past.  Patient thanked me for the assistance to obtain her respiratory therapy as well as her current Diabetes medication supply.   I will communicate preference for pens from [facility] for future delivery of MAP injectable insulins if possible to [facility].

## 2024-02-14 NOTE — Telephone Encounter (Signed)
 Reviewed and agree with Dr Macky Lower plan.

## 2024-02-17 ENCOUNTER — Other Ambulatory Visit

## 2024-02-18 ENCOUNTER — Telehealth: Payer: Self-pay

## 2024-02-18 NOTE — Telephone Encounter (Signed)
 Refill request/med change form placed in Dr. Carlena Bjornstad box  Changing Cassandra Reid to pens. Completing refills for Fiasp and Ozempic as well.

## 2024-02-20 ENCOUNTER — Ambulatory Visit
Admission: RE | Admit: 2024-02-20 | Discharge: 2024-02-20 | Disposition: A | Discharge status: Home / Self Care | Source: Ambulatory Visit | Attending: Neurology | Admitting: Neurology

## 2024-02-20 DIAGNOSIS — Z9889 Other specified postprocedural states: Secondary | ICD-10-CM | POA: Diagnosis not present

## 2024-02-20 DIAGNOSIS — I671 Cerebral aneurysm, nonruptured: Secondary | ICD-10-CM | POA: Diagnosis not present

## 2024-02-20 DIAGNOSIS — H8113 Benign paroxysmal vertigo, bilateral: Secondary | ICD-10-CM

## 2024-02-20 MED ORDER — IOPAMIDOL (ISOVUE-370) INJECTION 76%
75.0000 mL | Freq: Once | INTRAVENOUS | Status: AC | PRN
  Administered 2024-02-20: 75 mL via INTRAVENOUS

## 2024-02-21 ENCOUNTER — Other Ambulatory Visit: Payer: Self-pay | Admitting: *Deleted

## 2024-02-21 DIAGNOSIS — D751 Secondary polycythemia: Secondary | ICD-10-CM

## 2024-02-24 ENCOUNTER — Inpatient Hospital Stay: Payer: Medicare HMO | Attending: Hematology and Oncology

## 2024-02-24 ENCOUNTER — Inpatient Hospital Stay: Payer: Medicare HMO

## 2024-02-24 DIAGNOSIS — D751 Secondary polycythemia: Secondary | ICD-10-CM | POA: Insufficient documentation

## 2024-02-24 DIAGNOSIS — Z79899 Other long term (current) drug therapy: Secondary | ICD-10-CM | POA: Diagnosis not present

## 2024-02-24 LAB — CMP (CANCER CENTER ONLY)
ALT: 38 U/L (ref 0–44)
AST: 26 U/L (ref 15–41)
Albumin: 4.6 g/dL (ref 3.5–5.0)
Alkaline Phosphatase: 87 U/L (ref 38–126)
Anion gap: 6 (ref 5–15)
BUN: 18 mg/dL (ref 8–23)
CO2: 23 mmol/L (ref 22–32)
Calcium: 9.6 mg/dL (ref 8.9–10.3)
Chloride: 108 mmol/L (ref 98–111)
Creatinine: 1.04 mg/dL — ABNORMAL HIGH (ref 0.44–1.00)
GFR, Estimated: 59 mL/min — ABNORMAL LOW (ref >60)
Glucose, Bld: 184 mg/dL — ABNORMAL HIGH (ref 70–99)
Potassium: 4.3 mmol/L (ref 3.5–5.1)
Sodium: 137 mmol/L (ref 135–145)
Total Bilirubin: 0.5 mg/dL (ref 0.0–1.2)
Total Protein: 7.3 g/dL (ref 6.5–8.1)

## 2024-02-24 LAB — CBC WITH DIFFERENTIAL (CANCER CENTER ONLY)
Abs Immature Granulocytes: 0.03 10*3/uL (ref 0.00–0.07)
Basophils Absolute: 0.1 10*3/uL (ref 0.0–0.1)
Basophils Relative: 1 %
Eosinophils Absolute: 0.1 10*3/uL (ref 0.0–0.5)
Eosinophils Relative: 2 %
HCT: 44.4 % (ref 36.0–46.0)
Hemoglobin: 14.3 g/dL (ref 12.0–15.0)
Immature Granulocytes: 0 %
Lymphocytes Relative: 17 %
Lymphs Abs: 1.3 10*3/uL (ref 0.7–4.0)
MCH: 25.8 pg — ABNORMAL LOW (ref 26.0–34.0)
MCHC: 32.2 g/dL (ref 30.0–36.0)
MCV: 80.1 fL (ref 80.0–100.0)
Monocytes Absolute: 0.5 10*3/uL (ref 0.1–1.0)
Monocytes Relative: 6 %
Neutro Abs: 5.5 10*3/uL (ref 1.7–7.7)
Neutrophils Relative %: 74 %
Platelet Count: 187 10*3/uL (ref 150–400)
RBC: 5.54 MIL/uL — ABNORMAL HIGH (ref 3.87–5.11)
RDW: 15.4 % (ref 11.5–15.5)
WBC Count: 7.4 10*3/uL (ref 4.0–10.5)
nRBC: 0 % (ref 0.0–0.2)

## 2024-02-24 NOTE — Progress Notes (Signed)
 Patient presented today for therapeutic phlebotomy, order parameter not meet, hematocrit is less than 45. Today's Hct is 44.4, Lab result printed and result reviewed with patient, Patient denies any distress. Therapeutic Phlebotomy not need today.

## 2024-02-25 NOTE — Telephone Encounter (Signed)
 Refills completed by Dr. Miquel Dunn.   Form faxed to [facility].

## 2024-02-28 ENCOUNTER — Telehealth: Payer: Self-pay | Admitting: Pharmacist

## 2024-02-28 NOTE — Telephone Encounter (Signed)
 Patient contacted for follow-up of communication that she had insulin pen "cartridges" for Fiasp (insulin aspart) available for pick-up.  She verbalized that she understood how to use them.  She was also made aware that needle tips were available for pick-up as well.    - Insulin marked and in the refrigerator - Pen needle tips - 3 boxes on the counter in the medication room.  Both are labeled with her name.   During the call she sounded emotional/tearful. When asked if she was OK she stated that she was OK and verbalized she would pick-up the items on Monday.  She politely thanked me and then we finished the call.    Total time with patient call and documentation of interaction: 11 minutes.

## 2024-03-02 NOTE — Progress Notes (Signed)
 Patient advised.

## 2024-03-02 NOTE — Telephone Encounter (Signed)
 Reviewed and agree with Dr Macky Lower plan.

## 2024-03-09 ENCOUNTER — Other Ambulatory Visit: Payer: Self-pay | Admitting: Student

## 2024-03-09 ENCOUNTER — Other Ambulatory Visit: Payer: Self-pay | Admitting: Cardiology

## 2024-03-09 DIAGNOSIS — E876 Hypokalemia: Secondary | ICD-10-CM

## 2024-03-09 DIAGNOSIS — E1169 Type 2 diabetes mellitus with other specified complication: Secondary | ICD-10-CM

## 2024-03-10 ENCOUNTER — Encounter: Payer: Self-pay | Admitting: Student

## 2024-03-10 ENCOUNTER — Ambulatory Visit: Admitting: Student

## 2024-03-10 VITALS — BP 141/68 | HR 87 | Ht 61.0 in | Wt 176.4 lb

## 2024-03-10 DIAGNOSIS — E1142 Type 2 diabetes mellitus with diabetic polyneuropathy: Secondary | ICD-10-CM | POA: Diagnosis not present

## 2024-03-10 DIAGNOSIS — F331 Major depressive disorder, recurrent, moderate: Secondary | ICD-10-CM

## 2024-03-10 DIAGNOSIS — Z794 Long term (current) use of insulin: Secondary | ICD-10-CM | POA: Diagnosis not present

## 2024-03-10 MED ORDER — LANTUS SOLOSTAR 100 UNIT/ML ~~LOC~~ SOPN
60.0000 [IU] | PEN_INJECTOR | Freq: Every day | SUBCUTANEOUS | Status: DC
Start: 2024-03-10 — End: 2024-03-17

## 2024-03-10 NOTE — Assessment & Plan Note (Signed)
 Due to increased sugars will increase long-acting insulin  to 60 units daily.  Continue sliding scale with meals.  Patient will need follow-up with pharmacist in 2 to 4 weeks for continued medication titration.  She is currently on 2 mg of Ozempic  and tolerating this well.

## 2024-03-10 NOTE — Progress Notes (Signed)
    SUBJECTIVE:   CHIEF COMPLAINT / HPI:   Cassandra Reid is a 68 y.o. female presenting for diabetes follow up.  Per her Dexcom her blood sugars have been averaging in the high 200s.  She has been compliant with her insulin  per the following regimen:  Aspart: 16 to 18 AM , 14-16 afternoon, 12-14 night  50 treseba  2 ozempic  on Sunday   She has recently been started on Breztri  and feels that this is contributing to her high blood sugars.  However she has been getting great benefit from this inhaler.  PERTINENT  PMH / PSH: reviewed and updated.  OBJECTIVE:   BP (!) 141/68   Pulse 87   Ht 5\' 1"  (1.549 m)   Wt 176 lb 6.4 oz (80 kg)   SpO2 98%   BMI 33.33 kg/m   Well-appearing, no acute distress Cardio: Regular rate, regular rhythm, no murmurs on exam. Pulm: Clear, no wheezing, no crackles. No increased work of breathing Abdominal: bowel sounds present, soft, non-tender, non-distended Extremities: no peripheral edema  Neuro: alert and oriented x3, speech normal in content, no facial asymmetry, strength intact and equal bilaterally in UE and LE, pupils equal and reactive to light.  Psych:  Cognition and judgment appear intact. Alert, communicative  and cooperative with normal attention span and concentration. No apparent delusions, illusions, hallucinations    ASSESSMENT/PLAN:   Assessment & Plan Type 2 diabetes mellitus with diabetic polyneuropathy, with long-term current use of insulin  (HCC) Due to increased sugars will increase long-acting insulin  to 60 units daily.  Continue sliding scale with meals.  Patient will need follow-up with pharmacist in 2 to 4 weeks for continued medication titration.  She is currently on 2 mg of Ozempic  and tolerating this well. Moderate episode of recurrent major depressive disorder (HCC) Positive last question on the PHQ-9.  Discussed this in detail the patient and she denies active suicidal ideation.  She is has been referred to psychiatry in the  past but she prefers to receive her care here.     Clem Currier, DO [facility]

## 2024-03-10 NOTE — Patient Instructions (Signed)
 It was great to see you today!   Today we addressed: I am increasing your Treseba to 60 units daily for insulin . I would like for you to follow up with the pharmacist Dr. Koval in 2-4 weeks for medication adjustment.   Future Appointments  Date Time Provider Department Center  03/12/2024  8:00 PM Faustina Hood, MD [facility]  03/17/2024  2:00 PM Tory Freiberg, NP [facility]  05/25/2024  1:30 PM [facility] None  05/25/2024  2:00 PM Cameron Cea, MD [facility] None  05/25/2024  2:45 PM [facility] INFUSION [facility] None  06/01/2024  2:20 PM [facility] ANNUAL WELLNESS VISIT [facility]    Please arrive 15 minutes before your appointment to ensure smooth check in process.    Please call the clinic at [phone] if your symptoms worsen or you have any concerns.  Thank you for allowing me to participate in your care, Dr. Clem Currier [facility]

## 2024-03-10 NOTE — Assessment & Plan Note (Addendum)
 Positive last question on the PHQ-9.  Discussed this in detail the patient and she denies active suicidal ideation.  She is has been referred to psychiatry in the past but she prefers to receive her care here.

## 2024-03-12 ENCOUNTER — Ambulatory Visit (HOSPITAL_BASED_OUTPATIENT_CLINIC_OR_DEPARTMENT_OTHER): Payer: Medicare HMO | Attending: Pulmonary Disease | Admitting: Internal Medicine

## 2024-03-12 DIAGNOSIS — G4734 Idiopathic sleep related nonobstructive alveolar hypoventilation: Secondary | ICD-10-CM | POA: Diagnosis present

## 2024-03-12 DIAGNOSIS — G4733 Obstructive sleep apnea (adult) (pediatric): Secondary | ICD-10-CM | POA: Diagnosis not present

## 2024-03-13 ENCOUNTER — Telehealth: Payer: Self-pay

## 2024-03-13 DIAGNOSIS — E1142 Type 2 diabetes mellitus with diabetic polyneuropathy: Secondary | ICD-10-CM

## 2024-03-13 MED ORDER — BREZTRI AEROSPHERE 160-9-4.8 MCG/ACT IN AERO: 2.0000 | INHALATION_SPRAY | Freq: Two times a day (BID) | RESPIRATORY_TRACT | 3 refills | Status: DC

## 2024-03-13 MED ORDER — DAPAGLIFLOZIN PROPANEDIOL 10 MG PO TABS: 10.0000 mg | ORAL_TABLET | Freq: Every day | ORAL | 3 refills | Status: DC

## 2024-03-13 NOTE — Telephone Encounter (Signed)
 Chart reviewed for follow-up of request to continue medication assistance for two long-term medications.   Patient appears to be tolerating well and meeting goals of care for use of both Breztri  and Farxiga .   New prescriptions provided for Medication Assistance continuation through the manufacturers.    Total time with patient call and documentation of interaction: 11 minutes.

## 2024-03-13 NOTE — Telephone Encounter (Signed)
 Rec'd refill request from [facility] for patients Breztri  and Farxiga  medications.   Please send 90 day refills to [facility] pharmacy if applicable. Thanks!

## 2024-03-15 DIAGNOSIS — G4734 Idiopathic sleep related nonobstructive alveolar hypoventilation: Secondary | ICD-10-CM

## 2024-03-15 NOTE — Procedures (Signed)
 Maryan Smalling [facility] [address] [place], [place] [address] Tel: [phone]   Fax: [phone]  Titration Interpretation  Patient Name:  Cassandra Reid, Cassandra Reid Date:  03/12/2024 Referring Physician:  Christal Court MD  Indications for Polysomnography The patient is a 68 year-old Female who is 5\' 2"  and weighs 173.0 lbs. Her BMI equals 31.8.  A full night titration treatment study was performed. Baseline diagnostic HST 01/03/22 AHI 9/hr. desaturation to 75%, body weight 139 lbs  Medication  None reported   Polysomnogram Data A full night polysomnogram recorded the standard physiologic parameters including EEG, EOG, EMG, EKG, nasal and oral airflow.  Respiratory parameters of chest and abdominal movements were recorded with Respiratory Inductance Plethysmography belts.  Oxygen  saturation was recorded by pulse oximetry.   Sleep Architecture The total recording time of the polysomnogram was 377.9 minutes.  The total sleep time was 128.0 minutes.  The patient spent 7.8% of total sleep time in Stage N1, 92.2% in Stage N2, 0.0% in Stages N3, and 0.0% in REM.  Sleep latency was 94.9 minutes.  REM latency was - minutes.  Sleep Efficiency was 33.9%.  Wake after Sleep Onset time was 155.0 minutes.  Titration Summary The patient was titrated at pressures ranging from 5* cm/H20 with supplemental oxygen  at - up to 13* cm/H20 with supplemental oxygen  at -.  The last pressure used in the study was 13* cm/H20 with supplemental oxygen  at -.  Respiratory Events The polysomnogram revealed a presence of - obstructive, - central, and - mixed apneas resulting in an Apnea index of - events per hour.  There were 40 hypopneas (>=3% desaturation and/or arousal) resulting in an Apnea\Hypopnea Index (AHI >=3% desaturation and/or arousal) of 18.8 events per hour.  There were 20 hypopneas (>=4% desaturation) resulting in an Apnea\Hypopnea Index (AHI >=4% desaturation) of 9.4 events per  hour.  There were - Respiratory Effort Related Arousals resulting in a RERA index of - events per hour. The Respiratory Disturbance Index is 18.8 events per hour.  The snore index was - events per hour.  Mean oxygen  saturation was 93.9%.  The lowest oxygen  saturation during sleep was 81.0%.  Time spent <=88% oxygen  saturation was 15.4 minutes (4.2%).  Limb Activity There were - limb movements recorded.  Of this total, - were classified as PLMs.  Of the PLMs, - were associated with arousals.  The Limb Movement index was - per hour while the PLM index was - per hour.  Cardiac Summary The average pulse rate was 63.1 bpm.  The minimum pulse rate was 54.0 bpm while the maximum pulse rate was 92.0 bpm.  Cardiac rhythm was normal.  Comment:  CPAP titration to 13 cwp with residual AHI (4%) 0/hr, minimum O2 saturation 91%.  Diagnosis: OSA  Recommendations: Suggest autopap 8-18 or fixed CPAP 13. Patient wore a small ResMed F20 full face mask with heated humidification.   This study was personally reviewed and electronically signed by: Rosa College MD Accredited Board Certified in Sleep Medicine Date/Time: 03/15/24  11:43   Titration Report  Patient Name: Cassandra Reid, Cassandra Reid Date: 03/12/2024  Date of Birth: 01/22/1956 Study Type: CPAP Titration  Age: 77 year MRN #: [id number]  Sex: Female Interpreting Physician: Rosa College [id number]  Height: 5\' 2"  Referring Physician: Christal Court MD  Weight: 173.0 lbs Recording Tech: Metro Acron RPSGT RST  BMI: 31.8 Scoring Tech: Metro Acron RPSGT RST  ESS: 8 Neck Size: 16  Mask Type Resmed F20  Full Face Mask Final Pressure: 13cmH20  Mask Size: Small Supplemental O2: N/A   Study Overview  Lights Off: 09:53:37 PM  Count Index  Lights On: 04:11:31 AM Awakenings: 9 4.2  Time in Bed: 377.9 min. Arousals: 3 1.4  Total Sleep Time: 128.0 min. AHI (>=3% Desat and/or Ar.): 40 18.8   Sleep Efficiency: 33.9% AHI (>=4% Desat): 20 9.4   Sleep Latency: 94.9  min. Limb Movements: - -  Wake After Sleep Onset: 155.0 min. Snore: - -  REM Latency from Sleep Onset: - min. Desaturations: 42 19.7     Minimum SpO2 TST: 81.0%    Sleep Architecture  % of Time in Bed Stages Time (mins) % Sleep Time  Wake 250.5   Stage N1 10.0 7.8%  Stage N2 118.0 92.2%  Stage N3 0.0 0.0%  REM 0.0 0.0%   Arousal Summary   NREM REM Sleep Index  Respiratory Arousals - - - -  PLM Arousals - - - -  Isolated Limb Movement Arousals - - - -  Snore Arousals - - - -  Spontaneous Arousals 3 - 3 1.4  Total 3 - 3 1.4   Limb Movement Summary   Count Index  Isolated Limb Movements - -  Periodic Limb Movements (PLMs) - -  Total Limb Movements - -    Respiratory Summary   By Sleep Stage By Body Position Total   NREM REM Supine Non-Supine   Time (min) 128.0 0.0 120.0 8.0 128.0         Obstructive Apnea - - - - -  Mixed Apnea - - - - -  Central Apnea - - - - -  Total Apneas - - - - -  Total Apnea Index - - - - -         Hypopneas (>=3% Desat and/or Ar.) 40 - 37 3 40  AHI (>=3% Desat and/or Ar.) 18.8 - 18.5 22.5 18.8         Hypopneas (>=4% Desat) 20 - 18 2 20   AHI (>=4% Desat) 9.4 - 9.0 15.0 9.4          RERAs - - - - -  RERA Index - - - - -         RDI 18.8 - 18.5 22.5 18.8     Respiratory Event Durations   Apnea Hypopnea   NREM REM NREM REM  Average (seconds) - - 26.3 -  Maximum (seconds) - - 41.8 -    Oxygen  Saturation Summary   Wake NREM REM TST TIB  Average SpO2 95.1% 91.7% - 91.7% 93.9%  Minimum SpO2 85.0% 81.0% - 81.0% 81.0%  Maximum SpO2 99.0% 98.0% - 98.0% 99.0%   Oxygen  Saturation Distribution  Range (%) Time in range (min) Time in range (%)  90.0 - 100.0 330.6 90.8%  80.0 - 90.0 33.3 9.2%  70.0 - 80.0 - -  60.0 - 70.0 - -  50.0 - 60.0 - -  0.0 - 50.0 - -  Time Spent <=88% SpO2  Range (%) Time in range (min) Time in range (%)  0.0 - 88.0 15.4 4.2%      Count Index  Desaturations 42 19.7    Cardiac Summary   Wake NREM  REM Sleep Total  Average Pulse Rate (BPM) 63.1 63.1 - 63.1 63.1  Minimum Pulse Rate (BPM) 54.0 55.0 - 55.0 54.0  Maximum Pulse Rate (BPM) 91.0 92.0 - 92.0 92.0   Pulse Rate Distribution:  Range (bpm) Time in  range (min) Time in range (%)  0.0 - 40.0 - -  40.0 - 60.0 58.7 16.1%  60.0 - 80.0 304.4 83.5%  80.0 - 100.0 1.3 0.4%  100.0 - 120.0 - -  120.0 - 140.0 - -  140.0 - 200.0 - -   Titration Summary  PAP Device PAP Level O2 Level Time (min) Wake (min) NREM (min) REM (min) Sleep Eff% OA# CA# MA# Hyp# (>=3%) AHI (>=3%) Hyp# (>=4%) AHI (>=%4) RERA RDI SpO2 <=88% (min) Min SpO2 Mean SpO2 Ar. Index  - Off - 4.0 4.0 0.0 0.0 0.0%               CPAP 5 - 99.5 91.0 8.5 0.0 8.5% - - - 11 77.6 9  63.5 -  77.6  6.5 81.0 86.9 -  CPAP 7 - 96.5 79.0 17.5 0.0 18.1% - - - 9 30.9 3  10.3 -  30.9  4.1 84.0 90.6 -  CPAP 9 - 60.0 51.0 9.0 0.0 15.0% - - - 7 46.7 3  20.0 -  46.7  2.1 86.0 90.7 6.7  CPAP 11 - 38.0 0.5 37.5 0.0 98.7% - - - 6 9.6 1  1.6 -  9.6  0.5 88.0 91.6 1.6  CPAP 12 - 34.0 4.0 30.0 0.0 88.2% - - - 6 12.0 4  8.0 -  12.0  0.0 91.0 93.1 -  CPAP 13 - 46.5 21.0 25.5 0.0 54.8% - - - 1 2.4 -  - -  2.4  0.0 91.0 93.0 2.4    Hypnograms                           Technologist Comments  STUDY TYPE= CPAP MASK= RESMED F20 FULL FACE MASK, small FINAL PRESSURE= 13cmH20 Oxygen = None  Associated Diagnoses was OSA. Cpap Titration was ordered. Pressure was started on 5cmH20. Pressure was increased accordingly. Patient was fitted on Nasal Pillows and Full Face Mask.  Study was started on a Nasal Pillow Mask.  This mask was later switched to Resmed F20 Full Face Mask which the study was finished with. No Oxygen  was applied.  Patient slept Left, Right and Supine. No Significant PLMs Bathroom visit was taken twice.  -Tech                        [employer], Biomedical engineer of Sleep Medicine  ELECTRONICALLY SIGNED ON:  03/15/2024, 11:36  [facility] PH: [phone]   FX: [phone] ACCREDITED BY THE AMERICAN ACADEMY OF SLEEP MEDICINE

## 2024-03-16 ENCOUNTER — Telehealth (HOSPITAL_BASED_OUTPATIENT_CLINIC_OR_DEPARTMENT_OTHER): Payer: Self-pay | Admitting: Pulmonary Disease

## 2024-03-16 DIAGNOSIS — G4733 Obstructive sleep apnea (adult) (pediatric): Secondary | ICD-10-CM

## 2024-03-16 NOTE — Telephone Encounter (Signed)
 Reviewed and agree with Dr Macky Lower plan.

## 2024-03-16 NOTE — Progress Notes (Signed)
 PAP titration reviewed.  Please update CPAP order to change to the following: AutoCAPA 8-18 cm H20 with heated humidification and standard supplies including small ResMed F20 full face mask   Patient was updated regarding changes via voicemail.

## 2024-03-16 NOTE — Telephone Encounter (Signed)
 Copied from CRM [id number]. Topic: Clinical - Medical Advice >> Mar 16, 2024 12:40 PM Hilton Lucky wrote: Reason for CRM: Patient is calling in response to a call from Dr. Washington Hacker. Patient wants to know if she needs to bring her machine in to have the titration bumped up or if that can be done remotely. Patient is agreeable to raising titration.

## 2024-03-16 NOTE — Telephone Encounter (Signed)
 PAP titration reviewed.  Please update CPAP order to change to the following: AutoCAPA 8-18 cm H20 with heated humidification and standard supplies including small ResMed F20 full face mask   Patient was updated regarding changes via voicemail.

## 2024-03-16 NOTE — Telephone Encounter (Signed)
 Order placed and pt notified.

## 2024-03-17 ENCOUNTER — Ambulatory Visit: Admitting: Nurse Practitioner

## 2024-03-17 ENCOUNTER — Encounter: Payer: Self-pay | Admitting: Nurse Practitioner

## 2024-03-17 VITALS — BP 130/60 | HR 88 | Ht 61.0 in | Wt 176.0 lb

## 2024-03-17 DIAGNOSIS — Z860100 Personal history of colon polyps, unspecified: Secondary | ICD-10-CM

## 2024-03-17 DIAGNOSIS — Z860101 Personal history of adenomatous and serrated colon polyps: Secondary | ICD-10-CM | POA: Diagnosis not present

## 2024-03-17 DIAGNOSIS — K219 Gastro-esophageal reflux disease without esophagitis: Secondary | ICD-10-CM | POA: Diagnosis not present

## 2024-03-17 DIAGNOSIS — Z8719 Personal history of other diseases of the digestive system: Secondary | ICD-10-CM | POA: Diagnosis not present

## 2024-03-17 DIAGNOSIS — K5900 Constipation, unspecified: Secondary | ICD-10-CM | POA: Diagnosis not present

## 2024-03-17 MED ORDER — GOLYTELY 236 G PO SOLR: 4000.0000 mL | Freq: Once | ORAL | 0 refills | Status: AC

## 2024-03-17 NOTE — Progress Notes (Addendum)
 03/17/2024 Cassandra Reid [id number] July 11, 1956   Chief Complaint: Discuss scheduling a colonoscopy    History of Present Illness:  Cassandra Reid is a 68 year old female with a past medical history of anxiety, depression, fibromyalgia, hypertension, DM type II, CKD, legally blind, cerebral aneurysm s/p surgical repair, aneurysm, secondary polycythemia, sleep apnea nonadherent with CPAP, GERD and colon polyps. Past cholecystectomy, appendectomy, sinus surgery, tubal ligation and hysterectomy.    I initially saw her in an office 09/12/2023 for for further evaluation regarding burping, LUQ pain, constipation and abdominal bloat. She was prescribed MiraLAX nightly with Dulcolax 1-2 tabs every third night as needed and she was instructed to continue Pantoprazole  40 mg daily and to increase Famotidine  to 20 mg twice daily. She also noted being on Ozempic  which was likely contributing to her GI symptoms. Her colonoscopy 01/02/2021 by [facility] was reviewed which identified 2 small polyps removed from the colon, however, pathology results were not available at the time.  Following her office visit, I received her colonoscopy biopsy results which confirmed the two polyps removed from the colon were adenomatous. Dr. Dominic Reid reviewed her colonoscopy procedure report (fair prep)and path results and recommended scheduling a colon polyp surveillance colonoscopy at this time.   She presents today to schedule colonoscopy.  Her constipation is fairly well-controlled by taking MiraLAX 3 days weekly, she typically passes a formed or soft or loose bowel movement once daily.  No bloody or black stools.  No known family history of colon polyps or colorectal cancer.  She is no longer concerned about the change in her bowel pattern and increased burping which she mostly attributes to being on Ozempic  as the symptoms are more prominent for 2 days following her injection then resolve until the next  injection date.  She is currently taking Pantoprazole  40 mg daily and previously reduced Famotidine  to once daily.  Her most recent EGD was 04/18/2021 at [facility] which showed gastritis and a small hiatal hernia. Gastric biopsies were negative for H. pylori.        Latest Ref Rng & Units 02/24/2024    2:11 PM 11/26/2023    1:59 PM 09/26/2023    1:41 PM  CBC  WBC 4.0 - 10.5 K/uL 7.4  8.7  8.2   Hemoglobin 12.0 - 15.0 g/dL 62.9  52.8  41.3   Hematocrit 36.0 - 46.0 % 44.4  46.0  44.8   Platelets 150 - 400 K/uL 187  198  160        Latest Ref Rng & Units 02/24/2024    2:11 PM 10/11/2023    8:59 AM 09/26/2023    1:41 PM  CMP  Glucose 70 - 99 mg/dL 244   010   BUN 8 - 23 mg/dL 18   18   Creatinine 2.72 - 1.00 mg/dL 5.36   6.44   Sodium 034 - 145 mmol/L 137   138   Potassium 3.5 - 5.1 mmol/L 4.3   4.1   Chloride 98 - 111 mmol/L 108   105   CO2 22 - 32 mmol/L 23   25   Calcium  8.9 - 10.3 mg/dL 9.6   9.8   Total Protein 6.5 - 8.1 g/dL 7.3  6.8  6.9   Total Bilirubin 0.0 - 1.2 mg/dL 0.5  0.4  0.5   Alkaline Phos 38 - 126 U/L 87  105  88   AST 15 - 41 U/L 26  29  23  ALT 0 - 44 U/L 38  31  31    -  PAST GI PROCEDURES:    Col[facility]: 2 mm polyp in the ascending colon 5 mm polyp in the hepatic flexure Mild diverticulosis of the sigmoid colon Stool in the colon Note, copious amount of adherent stool noted in the right colon, extensive lavage performed with fair visualization   EGD 04/18/2021 by Dr. [facility]: Inlet patch at 17 cm Gastritis, biopsied Small hiatal hernia Normal duodenum      EGD 11/24/2020: Normal esophagus  Erosive gastritis, bx obtained Duodenitis, bx obtained    Colonoscopy 11/24/2020: Aborted secondary to stool in the colon   Current Outpatient Medications on File Prior to Visit  Medication Sig Dispense Refill   albuterol  (VENTOLIN  HFA) 108 (90 Base) MCG/ACT inhaler INHALE 2 PUFFS INTO THE LUNGS 2  (TWO) TIMES DAILY. PLUS 2 PUFFS AS NEEDED 18 each 1   amLODipine  (NORVASC ) 5 MG tablet TAKE 1 TABLET EVERY DAY 90 tablet 3   Ascorbic Acid (VITAMIN C ADULT GUMMIES PO) Take 1 each by mouth daily in the afternoon.     atorvastatin  (LIPITOR) 40 MG tablet Take 40 mg by mouth daily.     BD PEN NEEDLE NANO 2ND GEN 32G X 4 MM MISC USE WITH INSULIN  100 each 4   blood glucose meter kit and supplies Dispense based on patient and insurance preference. Use up to four times daily as directed. (FOR ICD-10 E10.9, E11.9). 1 each 0   budeson-glycopyrrolate-formoterol  (BREZTRI  AEROSPHERE) 160-9-4.8 MCG/ACT AERO inhaler Inhale 2 puffs into the lungs in the morning and at bedtime. 3 each 3   Continuous Blood Gluc Sensor (DEXCOM G6 SENSOR) MISC Use as directed to check blood sugar. 4 each 11   Continuous Blood Gluc Transmit (DEXCOM G6 TRANSMITTER) MISC Use as directed to check blood sugar. 1 each 12   dapagliflozin  propanediol (FARXIGA ) 10 MG TABS tablet Take 1 tablet (10 mg total) by mouth daily. 90 tablet 3   DULoxetine  (CYMBALTA ) 60 MG capsule TAKE 1 CAPSULE EVERY DAY 90 capsule 3   ezetimibe  (ZETIA ) 10 MG tablet TAKE 1 TABLET EVERY DAY 90 tablet 3   famotidine  (PEPCID ) 20 MG tablet Take 1 tablet (20 mg total) by mouth daily. 90 tablet 3   FIBER SELECT GUMMIES PO Take 1 tablet by mouth daily in the afternoon.     fluticasone  (FLONASE ) 50 MCG/ACT nasal spray Place 1 spray into both nostrils daily as needed for allergies or rhinitis. 9.9 mL 1   furosemide  (LASIX ) 40 MG tablet Take 1 tablet (40 mg total) by mouth as needed for edema.     gabapentin  (NEURONTIN ) 600 MG tablet TAKE 0.5-1 TABLET (300-600 MG TOTAL) BY MOUTH 3 (THREE) TIMES DAILY. (Patient taking differently: Take 600 mg by mouth daily.) 270 tablet 3   insulin  aspart (FIASP ) 100 UNIT/ML FlexTouch Pen Inject 14-16 Units into the skin daily with breakfast AND 12-14 Units daily with lunch AND 10-12 Units daily with supper. This is a patient assistance  medication.. 15 mL 3   Insulin  Pen Needle (NOVOFINE PLUS PEN NEEDLE) 32G X 4 MM MISC Inject 1 Device into the skin as directed. 100 each 11   lidocaine  (LIDODERM ) 5 % Place 1 patch onto the skin daily. Remove & Discard patch within 12 hours or as directed by MD 30 patch 0   lisinopril  (ZESTRIL ) 40 MG tablet TAKE 1 TABLET EVERY DAY 90 tablet 3   loratadine  (CLARITIN )  10 MG tablet TAKE 1 TABLET EVERY DAY 90 tablet 3   LORazepam  (ATIVAN ) 0.5 MG tablet Take 1 tablet (0.5 mg total) by mouth every 8 (eight) hours as needed for anxiety. 90 tablet 0   Magnesium  500 MG TABS Take 500 mg by mouth daily in the afternoon.     metoprolol  succinate (TOPROL -XL) 50 MG 24 hr tablet TAKE 1 TABLET (50 MG TOTAL) BY MOUTH DAILY. TAKE WITH OR IMMEDIATELY FOLLOWING A MEAL. 90 tablet 3   Multiple Vitamin (MULTIVITAMIN WITH MINERALS) TABS tablet Take 1 tablet by mouth in the morning.     OneTouch Delica Lancets 33G MISC Please use to check blood sugar up to 4 times daily. E11.42 100 each 12   Oyster Shell (OYSTER CALCIUM ) 500 MG TABS tablet Take 500 mg of elemental calcium  by mouth daily in the afternoon.     pantoprazole  (PROTONIX ) 40 MG tablet TAKE 1 TABLET EVERY DAY 90 tablet 3   potassium chloride  SA (KLOR-CON  M) 20 MEQ tablet TAKE 1 TABLET AS NEEDED (TAKE AS DIRECTED WITH LASIX  AS NEEDED FOR EDEMA) 90 tablet 1   Semaglutide , 2 MG/DOSE, (OZEMPIC , 2 MG/DOSE,) 8 MG/3ML SOPN Inject 2 mg into the skin once a week. 3 mL 11   traZODone  (DESYREL ) 50 MG tablet TAKE 1/2 TABLET BY MOUTH AT BEDTIME AS NEEDED FOR SLEEP. 45 tablet 1   TRESIBA  FLEXTOUCH 100 UNIT/ML FlexTouch Pen Inject 100 Units into the skin daily.     TRUE METRIX BLOOD GLUCOSE TEST test strip TEST BLOOD SUGAR AS INSTRUCTED 350 strip 3   No current facility-administered medications on file prior to visit.   No Known Allergies  Current Medications, Allergies, Past Medical History, Past Surgical History, Family History and Social History were reviewed in  [employer] record.  Review of Systems:   Constitutional: Negative for fever, sweats, chills or weight loss.  Respiratory: Negative for shortness of breath.   Cardiovascular: Negative for chest pain, palpitations and leg swelling.  Gastrointestinal: See HPI.  Musculoskeletal: Negative for back pain or muscle aches.  Neurological: Negative for dizziness, headaches or paresthesias.   Physical Exam: BP 130/60   Pulse 88   Ht 5\' 1"  (1.549 m)   Wt 176 lb (79.8 kg)   BMI 33.25 kg/m   General: 68 year old female with visual impairment in no acute distress, ambulates with a cane. Head: Normocephalic and atraumatic. Eyes: No scleral icterus. Conjunctiva pink . Ears: Normal auditory acuity. Mouth: Dentition intact. No ulcers or lesions.  Lungs: Clear throughout to auscultation. Heart: Regular rate and rhythm, no murmur. Abdomen: Soft, nontender and nondistended. No masses or hepatomegaly. Normal bowel sounds x 4 quadrants.  Rectal: Deferred.  Musculoskeletal: Symmetrical with no gross deformities. Extremities: No edema. Derm: Rosacea, facial telangiectasias. Neurological: Alert oriented x 4. No focal deficits.  Psychological: Alert and cooperative. Normal mood and affect  Assessment and Recommendations:  68 year old female with a history of colon polyps. Colonoscopy [facility]/02/[facility] identified 2 tubular adenomatous polyps removed from the colon, mild diverticulosis to the sigmoid colon and copious amount of adherent stool noted in the right colon, extensive lavage performed with fair visualization. -Colonoscopy with Dr. Dominic Reid benefits and risks discussed including risk with sedation, risk of bleeding, perforation and infection  -Golytely  split dose prep  - If patient has active constipation prior to her colonoscopy date, she was instructed to  take MiraLAX nightly x 7 nights prior to prep date then follow GoLytely  prep  instructions -Benefiber one  tablespoon daily  - Patient was informed she must have a friend, family member or adult she knows to accompany her to the endoscopy center and to stay on the premises during her procedure and to drive her home. She stated she does not have anyone to accompany her to her procedure therefore she was provided with contact info for [employer] medical transportation service.  GERD, stable on Pantoprazole  40 mg daily and Famotidine  20 mg daily.  She notices increased burping for 2 days post Ozempic  injection. Her most recent EGD 04/18/2021 showed gastritis and a small hiatal hernia. Gastric biopsies were negative for H. Pylori.  Constipation - Continue MiraLAX nightly as needed, currently taking 3 days weekly  DM type II  CKD stage IIIa

## 2024-03-17 NOTE — Patient Instructions (Signed)
 You have been scheduled for a colonoscopy. Please follow written instructions given to you at your visit today.   If you use inhalers (even only as needed), please bring them with you on the day of your procedure.  DO NOT TAKE 7 DAYS PRIOR TO TEST- Trulicity (dulaglutide) Ozempic , Wegovy  (semaglutide ) Mounjaro  (tirzepatide ) Bydureon Bcise (exanatide extended release)  DO NOT TAKE 1 DAY PRIOR TO YOUR TEST Rybelsus  (semaglutide ) Adlyxin (lixisenatide) Victoza (liraglutide) Byetta (exanatide) ___________________________________________________________________________  If you are having active constipation - take Miralax every night for a week prior to the colonoscopy prep date.  You can contact [facility] at [phone] to arrange for someone to drive you to your procedure, wait, and drive you home. The cost is approximately $100 (includes 4 hours), plus mileage. (Please note: Rates are subject to change)

## 2024-03-17 NOTE — Telephone Encounter (Signed)
 Rec'd 4 boxes of Fiasp  pens (finally).   4.5 month supply.  Will contact patient.

## 2024-03-19 NOTE — Progress Notes (Signed)
 ____________________________________________________________  Attending physician addendum:  Thank you for sending this case to me. I have reviewed the entire note and agree with the plan.   Amada Jupiter, MD  ____________________________________________________________

## 2024-04-01 ENCOUNTER — Other Ambulatory Visit: Payer: Self-pay | Admitting: Student

## 2024-04-01 DIAGNOSIS — F32A Depression, unspecified: Secondary | ICD-10-CM

## 2024-04-01 DIAGNOSIS — H6992 Unspecified Eustachian tube disorder, left ear: Secondary | ICD-10-CM

## 2024-04-02 DIAGNOSIS — G894 Chronic pain syndrome: Secondary | ICD-10-CM | POA: Diagnosis not present

## 2024-04-02 DIAGNOSIS — M5481 Occipital neuralgia: Secondary | ICD-10-CM | POA: Diagnosis not present

## 2024-04-06 ENCOUNTER — Other Ambulatory Visit: Payer: Self-pay | Admitting: Hematology and Oncology

## 2024-04-06 ENCOUNTER — Other Ambulatory Visit: Payer: Self-pay | Admitting: Student

## 2024-04-06 DIAGNOSIS — Z1231 Encounter for screening mammogram for malignant neoplasm of breast: Secondary | ICD-10-CM

## 2024-04-06 DIAGNOSIS — E1142 Type 2 diabetes mellitus with diabetic polyneuropathy: Secondary | ICD-10-CM | POA: Diagnosis not present

## 2024-04-06 NOTE — Telephone Encounter (Signed)
 Reached out to [facility].  Refill form never rec'd. Refaxed 04/06/24.

## 2024-04-07 ENCOUNTER — Ambulatory Visit: Admitting: Pharmacist

## 2024-04-09 ENCOUNTER — Other Ambulatory Visit: Payer: Self-pay

## 2024-04-15 ENCOUNTER — Other Ambulatory Visit: Payer: Self-pay

## 2024-04-16 ENCOUNTER — Encounter: Payer: Self-pay | Admitting: Pharmacist

## 2024-04-16 ENCOUNTER — Ambulatory Visit
Admission: RE | Admit: 2024-04-16 | Discharge: 2024-04-16 | Disposition: A | Discharge status: Home / Self Care | Source: Ambulatory Visit

## 2024-04-16 ENCOUNTER — Ambulatory Visit (INDEPENDENT_AMBULATORY_CARE_PROVIDER_SITE_OTHER): Admitting: Pharmacist

## 2024-04-16 VITALS — BP 138/80 | HR 95 | Wt 177.4 lb

## 2024-04-16 DIAGNOSIS — E1169 Type 2 diabetes mellitus with other specified complication: Secondary | ICD-10-CM | POA: Diagnosis not present

## 2024-04-16 DIAGNOSIS — I152 Hypertension secondary to endocrine disorders: Secondary | ICD-10-CM | POA: Diagnosis not present

## 2024-04-16 DIAGNOSIS — Z794 Long term (current) use of insulin: Secondary | ICD-10-CM

## 2024-04-16 DIAGNOSIS — E785 Hyperlipidemia, unspecified: Secondary | ICD-10-CM | POA: Diagnosis not present

## 2024-04-16 DIAGNOSIS — E1159 Type 2 diabetes mellitus with other circulatory complications: Secondary | ICD-10-CM | POA: Diagnosis not present

## 2024-04-16 DIAGNOSIS — E1142 Type 2 diabetes mellitus with diabetic polyneuropathy: Secondary | ICD-10-CM | POA: Diagnosis not present

## 2024-04-16 DIAGNOSIS — F411 Generalized anxiety disorder: Secondary | ICD-10-CM

## 2024-04-16 DIAGNOSIS — Z1231 Encounter for screening mammogram for malignant neoplasm of breast: Secondary | ICD-10-CM | POA: Diagnosis not present

## 2024-04-16 MED ORDER — BUSPIRONE HCL 7.5 MG PO TABS: 7.5000 mg | ORAL_TABLET | Freq: Three times a day (TID) | ORAL | 1 refills | Status: DC

## 2024-04-16 MED ORDER — INSULIN ASPART (W/NIACINAMIDE) 100 UNIT/ML ~~LOC~~ SOPN: PEN_INJECTOR | SUBCUTANEOUS | Status: DC

## 2024-04-16 NOTE — Progress Notes (Signed)
 S:     Chief Complaint  Patient presents with   Medication Management    Blood glucose follow-up   68 y.o. female who presents for diabetes evaluation, education, and management. Patient arrives visibly anxious and very concerned about her glucose control.    Patient was referred and last seen by Primary Care Provider, Dr. Annabell Key, on 03/10/2024. Since her last visit, patient reports having anxiety that has worsened over the past two months. She reports being unable to leave her house to go visit her children (this includes avoiding boarding a plane and also a train on two occasions). Patient has lorazepam  available, but only takes the medication when she is required to leave the house. She reports taking lorazepam  prior to her appointment today.  PMH is significant for T2DM, HLD, HTN, anxiety, and major depressive disorder.   Current diabetes medications include: Farxiga  (dapagliflozin ) 10 mg, Tresiba  (insulin  degludec) 60 units, Fiasp  (insulin  aspart) 12-18 units TID, Ozempic  (semaglutide ) 2 mg Current hypertension medications include: Lisinopril  40 mg, metoprolol  succinate 50 mg  Current hyperlipidemia medications include: atorvastatin  40 mg, Zetia  (ezetimibe ) 10 mg  Patient reports adherence to taking all medications as prescribed.  Do you feel that your medications are working for you? She is concerned about her sugar control; she states that her lorazapam has not helped her leave her house until today Have you been experiencing any side effects to the medications prescribed? no Do you have any problems obtaining medications due to transportation or finances? Patient receives Fiasp  (insulin  aspart) through patient assistance Insurance coverage: [employer] Medicare  O:   Review of Systems  Psychiatric/Behavioral:  The patient is nervous/anxious.   All other systems reviewed and are negative.   Physical Exam Vitals reviewed.  Constitutional:      Appearance: Normal appearance.   Pulmonary:     Effort: Pulmonary effort is normal.  Neurological:     Mental Status: She is alert.  Psychiatric:     Comments: Visibly anxious with tension, and verbally apology for being "not normal"     Libre3 CGM Download today 04/16/2024 % Time CGM is active: 94.7% Average Glucose: 204 mg/dL Glucose Management Indicator: 8.2%  Glucose Variability: 20.2% (goal <36%) Time in Goal:  - Time in range 70-180: 28% - Time above range: 72% - Time below range: <1%  Lab Results  Component Value Date   HGBA1C 8.3 (A) 12/31/2023   Vitals:   04/16/24 1425  BP: 138/80  Pulse: 95    Lipid Panel     Component Value Date/Time   CHOL 91 (L) 10/11/2023 0859   TRIG 96 10/11/2023 0859   HDL 44 10/11/2023 0859   CHOLHDL 2.1 10/11/2023 0859   CHOLHDL 2.9 07/31/2019 1339   VLDL 38.2 07/18/2018 0940   LDLCALC 28 10/11/2023 0859   LDLCALC 48 07/31/2019 1339   LDLDIRECT 129 (H) 03/18/2023 1715    A/P: Diabetes longstanding currently uncontrolled despite medication adherence (GMI=8.2% today, 04/16/24). Patient is able to verbalize appropriate hypoglycemia management plan. Medication adherence appears good. Control is most likely suboptimal due to significantly worsening anxiety over the past two months. -Continued basal insulin  Tresiba  (insulin  degludec) 60 units daily in the morning.  -Continued rapid insulin  Fiasp  (insulin  aspart): -With breakfast, take 16 units if BG <180 mg/dL, take 18 units if BG >914 mg/dL -With lunch, take 14 units if BG <180 mg/dL, take 16 units if BG >782 mg/dL -With dinner, take 12 units if BG <180 mg/dL, take 14 units if  BG >180 mg/L -Continued GLP-1 Ozempic  (semaglutide ) 2 mg weekly.  -Continued SGLT2-I  Farxiga  (dapagliflozin ) 10 mg. Counseled on sick day rules. -Patient educated on purpose, proper use, and potential adverse effects.  -Extensively discussed pathophysiology of diabetes, recommended lifestyle interventions, dietary effects on blood sugar  control. Discussed role of improved anxiety control in management of glucose as her stress/anxiety is likely leading to worsening glucose control.  -Counseled on s/sx of and management of hypoglycemia.  -Next A1c anticipated August 2025.   ASCVD risk - primary prevention in patient with diabetes. Last LDL 28 mg/dL, at goal of <47 mg/dL. ASCVD risk factors include HTN, T2DM. Moderate to high intensity statin indicated.  -Continued Lipitor (atorvastatin ) 40 mg.  -Continue Zetia  (ezetimibe ) 10 mg  Hypertension longstanding currently well controlled. Blood pressure near goal of <130/80 mmHg. Medication adherence appear good. Blood pressure control is suboptimal due to increased anxiety. -Continued lisinopril  40 mg daily -Continued metoprolol  succinate 50 mg daily  Anxiety longstanding currently uncontrolled, patient reports worsening symptoms of the past two months and inability to leave her house. She has taken lorazapam with minimal relief of symptoms. Patient also reports taking duloxetine  for pain control.  - Deferred anxiety management to Dr. Jearldine Mina - Buspirone initiated at today's visit by Dr. Jearldine Mina.   Written patient instructions provided. Patient verbalized understanding of treatment plan.  Total time in face to face counseling 28 minutes.    Follow-up:  Pharmacist late June or July PCP clinic visit in 04/24/24 Patient seen with Volney Grumbles, PharmD, PGY-1 pharmacy resident.

## 2024-04-16 NOTE — Assessment & Plan Note (Signed)
 Hypertension longstanding currently well controlled. Blood pressure near goal of <130/80 mmHg. Medication adherence appear good. Blood pressure control is suboptimal due to increased anxiety. -Continued lisinopril  40 mg daily -Continued metoprolol  succinate 50 mg daily

## 2024-04-16 NOTE — Assessment & Plan Note (Signed)
 Diabetes longstanding currently uncontrolled despite medication adherence (GMI=8.2% today, 04/16/24). Patient is able to verbalize appropriate hypoglycemia management plan. Medication adherence appears good. Control is most likely suboptimal due to significantly worsening anxiety over the past two months. -Continued basal insulin  Tresiba  (insulin  degludec) 60 units daily in the morning.  -Continued rapid insulin  Fiasp  (insulin  aspart): -With breakfast, take 16 units if BG <180 mg/dL, take 18 units if BG >161 mg/dL -With lunch, take 14 units if BG <180 mg/dL, take 16 units if BG >096 mg/dL -With dinner, take 12 units if BG <180 mg/dL, take 14 units if BG >045 mg/L -Continued GLP-1 Ozempic  (semaglutide ) 2 mg weekly.  -Continued SGLT2-I  Farxiga  (dapagliflozin ) 10 mg. Counseled on sick day rules. -Patient educated on purpose, proper use, and potential adverse effects.  -Extensively discussed pathophysiology of diabetes, recommended lifestyle interventions, dietary effects on blood sugar control. Discussed role of improved anxiety control in management of glucose as her stress/anxiety is likely leading to worsening glucose control.

## 2024-04-16 NOTE — Assessment & Plan Note (Signed)
 ASCVD risk - primary prevention in patient with diabetes. Last LDL 28 mg/dL, at goal of <91 mg/dL. ASCVD risk factors include HTN, T2DM. Moderate to high intensity statin indicated.  -Continued Lipitor (atorvastatin ) 40 mg -Continue Zetia  (ezetimibe ) 10 mg

## 2024-04-16 NOTE — Progress Notes (Signed)
    SUBJECTIVE:   CHIEF COMPLAINT / HPI:   Anxiety Seen at the request of Dr. Koval for poorly controlled anxiety. Patient is tearful and endorses worsening of her anxiety symptoms over the past several weeks. Has skipped several key family events due to her anxiety which is unusual for her.  She does have a standing prescription for as needed Ativan  0.5 mg, but she only uses this when she is planning to leave the house.  She otherwise closes herself in a closet where it is dark and quiet and counseled herself to get her anxiety under control.  Unfortunately, even the Ativan  has not allowed her to, say leave the home to see her son in [place]. Her last dose of Ativan  was this morning to allow her to venture out for medical appointments. No issues with falls, oversedation on this present dose.  She is currently on duloxetine  60 mg daily which she takes for fibromyalgia, occipital neuralgia, and her anxiety.  She does not recall ever trying buspirone in the past. She denies any thoughts of self-harm.   OBJECTIVE:   Wt 177 lb 6.4 oz (80.5 kg)   BMI 33.52 kg/m   Gen: Visibly anxious and tearful Resp: Short shallow breaths in setting of anxiety but speaking in full sentences and not in respiratory distress  Psych: Anxious, tearful. Thought, behavior, and judgement appear intact.   ASSESSMENT/PLAN:   Assessment & Plan Generalized anxiety disorder Poorly controlled on duloxetine  60 mg daily and Ativan  0.5 mg every 8 hours as needed.  She is very seldom taking the Ativan .  She has never tried doubling her dose to take a full 1mg  either.  She is willing to give this a try next time she finds herself experiencing panicky symptoms and locked in the closet at home. -Continue duloxetine  60 mg daily -Add buspirone 7.5 mg 3 times daily, she is already taking insulin  3 times daily and can coadministered BuSpar with her insulin  to help adhere to this dosing schedule -She is going to trial a 1 mg dose of  Ativan  if she has panicky symptoms between now and when she comes back to see me next week, we will evaluate the effectiveness of this intervention when I see her next week -Follow-up with me next Friday     J Lark Plum, MD [facility]

## 2024-04-16 NOTE — Assessment & Plan Note (Signed)
 Poorly controlled on duloxetine  60 mg daily and Ativan  0.5 mg every 8 hours as needed.  She is very seldom taking the Ativan .  She has never tried doubling her dose to take a full 1mg  either.  She is willing to give this a try next time she finds herself experiencing panicky symptoms and locked in the closet at home. -Continue duloxetine  60 mg daily -Add buspirone  7.5 mg 3 times daily, she is already taking insulin  3 times daily and can coadministered BuSpar  with her insulin  to help adhere to this dosing schedule -She is going to trial a 1 mg dose of Ativan  if she has panicky symptoms between now and when she comes back to see me next week, we will evaluate the effectiveness of this intervention when I see her next week -Follow-up with me next Friday

## 2024-04-16 NOTE — Patient Instructions (Addendum)
 It was nice to see you today!  Your goal blood sugar is 80-130 before eating and less than 180 after eating.  Medication Changes: START buspirone  7.5 mg tablet three times per day  Continue all other medication the same.   Keep up the good work with diet and exercise. Aim for a diet full of vegetables, fruit and lean meats (chicken, [place], fish). Try to limit salt intake by eating fresh or frozen vegetables (instead of canned), rinse canned vegetables prior to cooking and do not add any additional salt to meals.

## 2024-04-17 NOTE — Progress Notes (Signed)
 Reviewed and agree with Dr Macky Lower plan.

## 2024-04-24 ENCOUNTER — Ambulatory Visit

## 2024-04-28 ENCOUNTER — Telehealth: Payer: Self-pay

## 2024-04-28 DIAGNOSIS — E119 Type 2 diabetes mellitus without complications: Secondary | ICD-10-CM | POA: Diagnosis not present

## 2024-04-28 DIAGNOSIS — M5481 Occipital neuralgia: Secondary | ICD-10-CM | POA: Diagnosis not present

## 2024-04-28 LAB — HM DIABETES EYE EXAM

## 2024-04-30 ENCOUNTER — Encounter: Payer: Self-pay | Admitting: Student

## 2024-04-30 ENCOUNTER — Ambulatory Visit (INDEPENDENT_AMBULATORY_CARE_PROVIDER_SITE_OTHER): Admitting: Student

## 2024-04-30 VITALS — BP 121/58 | HR 81 | Ht 61.0 in | Wt 179.2 lb

## 2024-04-30 DIAGNOSIS — Z794 Long term (current) use of insulin: Secondary | ICD-10-CM | POA: Diagnosis not present

## 2024-04-30 DIAGNOSIS — F411 Generalized anxiety disorder: Secondary | ICD-10-CM

## 2024-04-30 DIAGNOSIS — E1142 Type 2 diabetes mellitus with diabetic polyneuropathy: Secondary | ICD-10-CM

## 2024-04-30 LAB — POCT GLYCOSYLATED HEMOGLOBIN (HGB A1C): HbA1c, POC (controlled diabetic range): 8.5 % — AB (ref 0.0–7.0)

## 2024-04-30 MED ORDER — BUSPIRONE HCL 10 MG PO TABS: 10.0000 mg | ORAL_TABLET | Freq: Three times a day (TID) | ORAL | 2 refills | Status: DC

## 2024-04-30 NOTE — Progress Notes (Signed)
 SUBJECTIVE:   CHIEF COMPLAINT / HPI:   Cassandra Reid is a 68 year old female with depression and anxiety who presents with worsening anxiety symptoms.  Her anxiety has worsened over the past few months despite taking duloxetine  60 mg daily. She takes Buspar  three times a day, which provides some relief. She avoids regular use of Ativan  due to concerns about interactions and drowsiness.  She takes the Ativan  at least once every 2 weeks and denies any drowsiness.  Coping mechanisms include sitting in the dark and counting, which she finds helpful. She avoids her closet due to mouse droppings and now sits in a corner daily. She feels isolated in her apartment complex, where most residents are younger, and her inability to drive limits social interactions.  She moved from [place]  three years ago, and her husband left shortly after. She does not drive due to seizures and vision issues, relying on [place] for transportation, which is costly.  She is concerned about increased blood sugar levels over the past two months. She monitors her blood sugar daily and follows an insulin  regimen of Fiasp , Tresiba , and Ozempic .  Reid A1c 4 months ago was 8.2%.  She saw Dr. Koval few weeks ago and adjustments were made to her medication however patient reports there has not been any significant change in her daily blood sugar readings.  Glucometer average daily blood glucose in the Reid 14 days was 235  Diabetic medication includes Short acting insulin  16-14-12 mg with meals Tresiba  60 mg daily Ozempic  2 mg weekly.   PERTINENT  PMH / PSH: Reviewed   OBJECTIVE:   BP (!) 121/58   Pulse 81   Ht 5' 1 (1.549 m)   Wt 179 lb 3.2 oz (81.3 kg)   SpO2 98%   BMI 33.86 kg/m        04/30/2024   10:01 AM 04/30/2024    9:21 AM 03/10/2024    9:39 AM 12/31/2023   11:05 AM 10/15/2023    1:47 PM  Depression screen PHQ 2/9  Decreased Interest  3 3 3 1   Down, Depressed, Hopeless  2 3 2 1   PHQ - 2 Score  5 6  5 2   Altered sleeping  1 1 0 1  Tired, decreased energy  2 2 2 1   Change in appetite  0 0 0 0  Feeling bad or failure about yourself   3 2 2 1   Trouble concentrating  1 0 2 1  Moving slowly or fidgety/restless 0  0  0  Suicidal thoughts 0 -- 1 1 0  PHQ-9 Score  12 12 12 6   Difficult doing work/chores  Very difficult   Somewhat difficult     Physical Exam General: Alert, well appearing, NAD Cardiovascular: RRR, No Murmurs, Normal S2/S2 Respiratory: CTAB, No wheezing or Rales Abdomen: No distension or tenderness Psych: Good judgment, intermittently tearful with occasional smile and laughter. Good eye contact  ASSESSMENT/PLAN:   Generalized anxiety disorder Anxiety persists despite duloxetine  and Buspar . Isolation and limited social interaction worsen her condition.  Given severe anxiety which impact's patient daily activity and the complexity, she will benefit from seeing a therapist. Patient open to psychiatric evaluation and therapy. - Increase Buspar  to 10 mg three times daily. - Refer to psychiatrist for evaluation and management. - Provide list of therapists - Advise holding off on Ativan  unless necessary.  Type 2 diabetes mellitus with diabetic polyneuropathy, with long-term current use of insulin  Elevated blood glucose  levels despite medication adherence. Per CGM daily CGM average is 235. Under Dr. Jori Newer care. A1c today was 8.5% -Obtained A1c - Schedule appointment with Dr. Koval for evaluation and potential management adjustment.     Cassandra Last, MD [facility]

## 2024-04-30 NOTE — Assessment & Plan Note (Addendum)
 Elevated blood glucose levels despite medication adherence. Per CGM daily CGM average is 235. Under Dr. Jori Newer care. A1c today was 8.5% -Obtained A1c - Schedule appointment with Dr. Koval for evaluation and potential management adjustment.

## 2024-04-30 NOTE — Assessment & Plan Note (Signed)
 Anxiety persists despite duloxetine  and Buspar . Isolation and limited social interaction worsen her condition.  Given severe anxiety which impact's patient daily activity and the complexity, she will benefit from seeing a therapist. Patient open to psychiatric evaluation and therapy. - Increase Buspar  to 10 mg three times daily. - Refer to psychiatrist for evaluation and management. - Provide list of therapists - Advise holding off on Ativan  unless necessary.

## 2024-04-30 NOTE — Patient Instructions (Addendum)
 Patient to meet you today.  I have increased your BuSpar  to 10 mg 3 times a day.  Also for your Ativan  due to side effects I recommend we start planning to cut down on how frequent is being used.  I have placed referral to a psychiatrist to help with long-term management of your anxiety and depression.  Below I have provided you with list of therapists some of them do take Medicare.  Your A1c today was 8.5% slightly elevated. I recommend following up with Dr. Koval for to continue adjustments with your medications.     Therapy and Counseling Resources Most providers on this list will take Medicaid. Patients with commercial insurance or Medicare should contact their insurance company to get a list of in network providers.  [facility] (takes children) Location 1: [address] [place], [place] [address] Location 2: [address] [place], [place] [address] [phone]   [facility] (spanish speaking therapist available)(habla espanol)(take medicare and medicaid)  [address], [place], [place] [address], [place]  [email or web address] [phone]  [facility]Psychiatry and Counseling [address] [facility] [address] [place], [place] [address] [phone]  [facility]   [address] [place], [place] [address]      [phone]  [facility] (spanish available) [address]  [place], [place] [address] [phone]  [facility] (take [facility] and medicare) [address], [address]  [place], [place] [address]       [phone]     [facility] (virtual only) [phone]  [facility] [address] [place], [place] [phone]  [facility]:  [address] [place] [phone]  [facility]:  [address] [place] [phone]  [facility] (under & uninsured) [address]   [place]  [phone]    [email or web address]    [facility] [address]  [place]    [phone]  [facility] -[address]     Phone:  [phone]     [facility]-  [address]  [phone]   [facility] [address]      [place], [place] [phone]  [facility]: [name] [place], [place], [place]  [phone]   [facility] (Spanish therapist) [email or web address]  [address] [place]  [address]   [place] [address]    [phone]    [facility]   [address],  [place], [place]  [phone]   [facility]  [address] [place]  [phone]  [facility]  [address] [place], [place]        [phone]  Open Access/Walk In Clinic under & uninsured  [facility]  [address] [place], [place] [phone] Crisis [phone]  [facility] [address],  (Spanish)   [address] , [place]: [phone]) 8:30 - 12; 1 - 2:30  [facility] [employer],  [address] [place], [place]    [phone]):8:30 - 12; 2 - 3PM  [facility] [employer],  [address]  [place]; [phone]):   Mon - Fri 8 AM - 5 PM  [facility] [address] [place]  MWF 12:30 to 3:00 or call to schedule an appointment  [phone]  Specific Provider  options Psychology Today  [email or web address]  click on find a therapist  enter your zip code left side and select or tailor a therapist for your specific need.   [facility] Provider Directory [email or web address]  (Medicaid)   Follow all drop down to find a provider  Social Support program [facility] [phone] or PhotoS[employer].pl [address], [place], [place] Recovery support and educational    24- Hour Availability:   [facility]  [address] [place], [place] [phone] Crisis [phone]  [facility] [phone]  [facility]  [phone]   [facility]  [phone] (after hours)  Therapeutic Alternative/Mobile Crisis   [phone]  [place]  National Suicide Hotline  4065485546 Derrel Flies)  Call 911 or go to emergency room  [facility]  [phone]);  [place] and [employer]  [phone]); [place], [place], [place], [place], [place], [place], [place]

## 2024-05-03 ENCOUNTER — Encounter: Payer: Self-pay | Admitting: Student

## 2024-05-03 DIAGNOSIS — M797 Fibromyalgia: Secondary | ICD-10-CM

## 2024-05-03 DIAGNOSIS — G894 Chronic pain syndrome: Secondary | ICD-10-CM

## 2024-05-03 DIAGNOSIS — N183 Chronic kidney disease, stage 3 unspecified: Secondary | ICD-10-CM

## 2024-05-04 MED ORDER — GABAPENTIN 600 MG PO TABS: ORAL_TABLET | ORAL | 3 refills | Status: DC

## 2024-05-04 MED ORDER — FUROSEMIDE 40 MG PO TABS
40.0000 mg | ORAL_TABLET | ORAL | Status: DC | PRN
Start: 2024-05-04 — End: 2024-09-17

## 2024-05-05 ENCOUNTER — Ambulatory Visit (AMBULATORY_SURGERY_CENTER): Admitting: Gastroenterology

## 2024-05-05 ENCOUNTER — Encounter: Payer: Self-pay | Admitting: Gastroenterology

## 2024-05-05 VITALS — BP 139/86 | HR 68 | Temp 97.3°F | Resp 12 | Ht 61.0 in | Wt 176.0 lb

## 2024-05-05 DIAGNOSIS — D122 Benign neoplasm of ascending colon: Secondary | ICD-10-CM

## 2024-05-05 DIAGNOSIS — F329 Major depressive disorder, single episode, unspecified: Secondary | ICD-10-CM | POA: Diagnosis not present

## 2024-05-05 DIAGNOSIS — D123 Benign neoplasm of transverse colon: Secondary | ICD-10-CM

## 2024-05-05 DIAGNOSIS — K760 Fatty (change of) liver, not elsewhere classified: Secondary | ICD-10-CM | POA: Diagnosis not present

## 2024-05-05 DIAGNOSIS — Z1211 Encounter for screening for malignant neoplasm of colon: Secondary | ICD-10-CM | POA: Diagnosis not present

## 2024-05-05 DIAGNOSIS — Z860100 Personal history of colon polyps, unspecified: Secondary | ICD-10-CM

## 2024-05-05 DIAGNOSIS — F411 Generalized anxiety disorder: Secondary | ICD-10-CM | POA: Diagnosis not present

## 2024-05-05 DIAGNOSIS — Z860101 Personal history of adenomatous and serrated colon polyps: Secondary | ICD-10-CM | POA: Diagnosis not present

## 2024-05-05 MED ORDER — SODIUM CHLORIDE 0.9 % IV SOLN: 500.0000 mL | Freq: Once | INTRAVENOUS | Status: AC

## 2024-05-05 NOTE — Progress Notes (Signed)
 To pacu, VSS. Report to [email or web address]

## 2024-05-05 NOTE — Progress Notes (Signed)
 Called to room to assist during endoscopic procedure.  Patient ID and intended procedure confirmed with present staff. Received instructions for my participation in the procedure from the performing physician.

## 2024-05-05 NOTE — Progress Notes (Signed)
 History and Physical:  This patient presents for endoscopic testing for: Encounter Diagnosis  Name Primary?   History of colon polyps Yes    Surveillance colonoscopy today for Hx adenomatous polyps on last colonoscopy at outside practice in Feb 2022.  Clinical details in office consult note from 03/17/24 with no significant changes since then. Chronic constipation on treatment  Patient is otherwise without complaints or active issues today.   Past Medical History: Past Medical History:  Diagnosis Date   Cerebral aneurysm without rupture    CKD stage 3 due to type 2 diabetes mellitus 04/15/2019   DDD (degenerative disc disease), cervical 04/16/2019   Encephalomalacia on imaging study 08/25/2020   Brain MRI 08/25/21: Few scattered areas of encephalomalacia and gliosis within the overlying left frontal lobe most likely postoperative in nature.   Fatty liver 05/31/2020   Generalized anxiety disorder    GERD (gastroesophageal reflux disease)    Hyperlipidemia associated with type 2 diabetes mellitus 07/18/2018   Hypertension associated with diabetes 07/18/2018   Incontinence 09/22/2021   Bowel and bladder.    Legally blind    Major depressive disorder    Osteoporosis 01/18/2021   Paroxysmal atrial tachycardia (HCC)    On telemetry   Type 2 diabetes mellitus with diabetic polyneuropathy, with long-term current use of insulin  07/18/2018   has dexcom G7  - sensor left upper arm     Past Surgical History: Past Surgical History:  Procedure Laterality Date   ABDOMINAL HYSTERECTOMY  2012   APPENDECTOMY  1972   CEREBRAL ANEURYSM REPAIR Left 2004   Aneurysm clip in place near the level of the left ICA on Brain MRI 08/2020. Also coiling performed   CHOLECYSTECTOMY  2015   COLONOSCOPY  11/24/2020   in [facility]   CRANIOTOMY Left    left pterional craniotomy   ETHMOIDECTOMY Left 11/23/2022   Procedure: LEFT ANTERIOR ETHMOIDECTOMY;  Surgeon: Daleen Dubs, DO;  Location: [facility] OR;   Service: ENT;  Laterality: Left;   EYE SURGERY  2023   MAXILLARY ANTROSTOMY Left 11/23/2022   Procedure: MAXILLARY ANTROSTOMY;  Surgeon: Daleen Dubs, DO;  Location: [facility] OR;  Service: ENT;  Laterality: Left;   MENISCUS REPAIR Bilateral    NASAL TURBINATE REDUCTION Bilateral 11/23/2022   Procedure: Bilateral turbinate reduction;  Surgeon: Daleen Dubs, DO;  Location: [facility] OR;  Service: ENT;  Laterality: Bilateral;   SINUS ENDO W/FUSION Left 11/23/2022   Procedure: Functional endoscopic sinus surgery with navigation.;  Surgeon: Daleen Dubs, DO;  Location: [facility] OR;  Service: ENT;  Laterality: Left;   TUBAL LIGATION  1990   UPPER GI ENDOSCOPY  11/24/2020   in [facility]    Allergies: No Known Allergies  Outpatient Meds: Current Outpatient Medications  Medication Sig Dispense Refill   albuterol  (VENTOLIN  HFA) 108 (90 Base) MCG/ACT inhaler INHALE 2 PUFFS INTO THE LUNGS 2 (TWO) TIMES DAILY. PLUS 2 PUFFS AS NEEDED 18 each 1   amLODipine  (NORVASC ) 5 MG tablet TAKE 1 TABLET EVERY DAY 90 tablet 3   Ascorbic Acid (VITAMIN C ADULT GUMMIES PO) Take 1 each by mouth daily in the afternoon.     atorvastatin  (LIPITOR) 40 MG tablet Take 40 mg by mouth daily.     BD PEN NEEDLE NANO 2ND GEN 32G X 4 MM MISC USE WITH INSULIN  100 each 4   blood glucose meter kit and supplies Dispense based on patient and insurance preference. Use up to four times daily as directed. (FOR ICD-10 E10.9, E11.9).  1 each 0   busPIRone  (BUSPAR ) 10 MG tablet Take 1 tablet (10 mg total) by mouth 3 (three) times daily. 90 tablet 2   Continuous Blood Gluc Sensor (DEXCOM G6 SENSOR) MISC Use as directed to check blood sugar. 4 each 11   Continuous Blood Gluc Transmit (DEXCOM G6 TRANSMITTER) MISC Use as directed to check blood sugar. 1 each 12   dapagliflozin  propanediol (FARXIGA ) 10 MG TABS tablet Take 1 tablet (10 mg total) by mouth daily. 90 tablet 3   DULoxetine  (CYMBALTA ) 60 MG capsule TAKE 1 CAPSULE EVERY DAY 90 capsule 3    ezetimibe  (ZETIA ) 10 MG tablet TAKE 1 TABLET EVERY DAY 90 tablet 3   famotidine  (PEPCID ) 20 MG tablet Take 1 tablet (20 mg total) by mouth daily. 90 tablet 3   FIBER SELECT GUMMIES PO Take 1 tablet by mouth daily in the afternoon.     fluticasone  (FLONASE ) 50 MCG/ACT nasal spray USE 1 SPRAY INTO BOTH NOSTRILS DAILY AS NEEDED FOR ALLERGIES OR RHINITIS. 16 g 5   furosemide  (LASIX ) 40 MG tablet Take 1 tablet (40 mg total) by mouth as needed for edema.     gabapentin  (NEURONTIN ) 600 MG tablet TAKE 0.5-1 TABLET (300-600 MG TOTAL) BY MOUTH 3 (THREE) TIMES DAILY. 270 tablet 3   insulin  aspart (FIASP ) 100 UNIT/ML FlexTouch Pen Inject 16-18 Units into the skin daily with breakfast AND 14-16 Units daily with lunch AND 12-14 Units daily with supper. This is a patient assistance medication..     lisinopril  (ZESTRIL ) 40 MG tablet TAKE 1 TABLET EVERY DAY 90 tablet 3   loratadine  (CLARITIN ) 10 MG tablet TAKE 1 TABLET EVERY DAY 90 tablet 3   metoprolol  succinate (TOPROL -XL) 50 MG 24 hr tablet TAKE 1 TABLET (50 MG TOTAL) BY MOUTH DAILY. TAKE WITH OR IMMEDIATELY FOLLOWING A MEAL. 90 tablet 3   Multiple Vitamin (MULTIVITAMIN WITH MINERALS) TABS tablet Take 1 tablet by mouth in the morning.     OneTouch Delica Lancets 33G MISC Please use to check blood sugar up to 4 times daily. E11.42 100 each 12   pantoprazole  (PROTONIX ) 40 MG tablet TAKE 1 TABLET EVERY DAY 90 tablet 3   potassium chloride  SA (KLOR-CON  M) 20 MEQ tablet TAKE 1 TABLET AS NEEDED (TAKE AS DIRECTED WITH LASIX  AS NEEDED FOR EDEMA) 90 tablet 1   TRESIBA  FLEXTOUCH 100 UNIT/ML FlexTouch Pen Inject 60 Units into the skin daily.     TRUE METRIX BLOOD GLUCOSE TEST test strip TEST BLOOD SUGAR AS INSTRUCTED 350 strip 3   budeson-glycopyrrolate-formoterol  (BREZTRI  AEROSPHERE) 160-9-4.8 MCG/ACT AERO inhaler Inhale 2 puffs into the lungs in the morning and at bedtime. 3 each 3   Insulin  Pen Needle (NOVOFINE PLUS PEN NEEDLE) 32G X 4 MM MISC Inject 1 Device into the  skin as directed. 100 each 11   LORazepam  (ATIVAN ) 0.5 MG tablet TAKE 1 TABLET EVERY 8 HOURS AS NEEDED FOR ANXIETY 90 tablet 0   Oyster Shell (OYSTER CALCIUM ) 500 MG TABS tablet Take 500 mg of elemental calcium  by mouth daily in the afternoon.     Semaglutide , 2 MG/DOSE, (OZEMPIC , 2 MG/DOSE,) 8 MG/3ML SOPN Inject 2 mg into the skin once a week. 3 mL 11   Current Facility-Administered Medications  Medication Dose Route Frequency Provider Last Rate Last Admin   0.9 %  sodium chloride  infusion  500 mL Intravenous Once Danis, Lior Hoen L III, MD          ___________________________________________________________________ Objective   Exam:  BP Aaron Aas)  152/70   Pulse 77   Temp (!) 97.3 F (36.3 C)   Resp 17   Ht 5' 1 (1.549 m)   Wt 176 lb (79.8 kg)   SpO2 99%   BMI 33.25 kg/m   CV: regular , S1/S2 Resp: clear to auscultation bilaterally, normal RR and effort noted GI: soft, no tenderness, with active bowel sounds.   Assessment: Encounter Diagnosis  Name Primary?   History of colon polyps Yes     Plan: Colonoscopy   The benefits and risks of the planned procedure(s) were described in detail with the patient or (when appropriate) their health care proxy.  Risks were outlined as including, but not limited to, bleeding, infection, perforation, adverse medication reaction leading to cardiac or pulmonary decompensation, pancreatitis (if ERCP).  The limitation of incomplete mucosal visualization was also discussed.  No guarantees or warranties were given.  The patient is appropriate for an endoscopic procedure in the ambulatory setting.   - Lorella Roles, MD

## 2024-05-05 NOTE — Progress Notes (Signed)
 Pt's states no medical or surgical changes since previsit or office visit.

## 2024-05-05 NOTE — Op Note (Signed)
 [facility] Patient Name: Cassandra Reid Procedure Date: 05/05/2024 8:02 AM MRN: [id number] Endoscopist: Ace Abu L. Dominic Friendly , MD, [id number] Age: 68 Referring MD:  Date of Birth: 1956/08/30 Gender: Female Account #: [id number] Procedure:                Colonoscopy Indications:              Surveillance: Personal history of adenomatous                            polyps on last colonoscopy 3 years ago                           two TA polyps Feb 2022 at outside practice Medicines:                Monitored Anesthesia Care Procedure:                Pre-Anesthesia Assessment:                           - Prior to the procedure, a History and Physical                            was performed, and patient medications and                            allergies were reviewed. The patient's tolerance of                            previous anesthesia was also reviewed. The risks                            and benefits of the procedure and the sedation                            options and risks were discussed with the patient.                            All questions were answered, and informed consent                            was obtained. Prior Anticoagulants: The patient has                            taken no anticoagulant or antiplatelet agents. ASA                            Grade Assessment: III - A patient with severe                            systemic disease. After reviewing the risks and                            benefits, the patient was deemed in satisfactory  condition to undergo the procedure.                           After obtaining informed consent, the colonoscope                            was passed under direct vision. Throughout the                            procedure, the patient's blood pressure, pulse, and                            oxygen  saturations were monitored continuously. The                            Olympus Scope [id number]  was introduced through the                            anus and advanced to the the cecum, identified by                            appendiceal orifice and ileocecal valve. The                            colonoscopy was performed without difficulty. The                            patient tolerated the procedure well. The quality                            of the bowel preparation was fair in some areas                            despite lavage (retained adherent fibrous material.                            The ileocecal valve, appendiceal orifice, and                            rectum were photographed. The bowel preparation                            used was GoLYTELY . (patient had also used miralax                            daily for a week before prep day) Scope In: 8:11:35 AM Scope Out: 8:39:32 AM Scope Withdrawal Time: 0 hours 21 minutes 18 seconds  Total Procedure Duration: 0 hours 27 minutes 57 seconds  Findings:                 The perianal and digital rectal examinations were                            normal.  Repeat examination of right colon under NBI                            performed.                           Four sessile polyps were found in the transverse                            colon and ascending colon. The polyps were 4 to 8                            mm in size. These polyps were removed with a cold                            snare. Resection and retrieval were complete.                           A 10 mm polyp was found in the ascending colon. The                            polyp was sessile. The polyp was removed with a                            cold snare. Resection and retrieval were complete.                            (removed en bloc and cut with cold snare for                            retrieval)                           The exam was otherwise without abnormality on                            direct and retroflexion  views. Complications:            No immediate complications. Estimated Blood Loss:     Estimated blood loss was minimal. Impression:               - Preparation of the colon was fair.                           - Four 4 to 8 mm polyps in the transverse colon and                            in the ascending colon, removed with a cold snare.                            Resected and retrieved.                           - One 10 mm polyp in the ascending colon,  removed                            with a cold snare. Resected and retrieved.                           - The examination was otherwise normal on direct                            and retroflexion views. Recommendation:           - Patient has a contact number available for                            emergencies. The signs and symptoms of potential                            delayed complications were discussed with the                            patient. Return to normal activities tomorrow.                            Written discharge instructions were provided to the                            patient.                           - Resume previous diet.                           - Continue present medications.                           - Await pathology results.                           - Repeat colonoscopy in 3 years for surveillance.                            For next exam, close attention to pre-procedure                            dietary restrictions regarding fibrous foods. Leyland Kenna L. Dominic Friendly, MD 05/05/2024 8:51:17 AM This report has been signed electronically.

## 2024-05-05 NOTE — Patient Instructions (Addendum)
   Handouts on polyps given to you today  Await pathology results on polyps removed   Continue previous diet & medications      YOU HAD AN ENDOSCOPIC PROCEDURE TODAY AT THE  ENDOSCOPY CENTER:   Refer to the procedure report that was given to you for any specific questions about what was found during the examination.  If the procedure report does not answer your questions, please call your gastroenterologist to clarify.  If you requested that your care partner not be given the details of your procedure findings, then the procedure report has been included in a sealed envelope for you to review at your convenience later.  YOU SHOULD EXPECT: Some feelings of bloating in the abdomen. Passage of more gas than usual.  Walking can help get rid of the air that was put into your GI tract during the procedure and reduce the bloating. If you had a lower endoscopy (such as a colonoscopy or flexible sigmoidoscopy) you may notice spotting of blood in your stool or on the toilet paper. If you underwent a bowel prep for your procedure, you may not have a normal bowel movement for a few days.  Please Note:  You might notice some irritation and congestion in your nose or some drainage.  This is from the oxygen used during your procedure.  There is no need for concern and it should clear up in a day or so.  SYMPTOMS TO REPORT IMMEDIATELY:  Following lower endoscopy (colonoscopy or flexible sigmoidoscopy):  Excessive amounts of blood in the stool  Significant tenderness or worsening of abdominal pains  Swelling of the abdomen that is new, acute  Fever of 100F or higher  For urgent or emergent issues, a gastroenterologist can be reached at any hour by calling [phone]. Do not use MyChart messaging for urgent concerns.    DIET:  We do recommend a small meal at first, but then you may proceed to your regular diet.  Drink plenty of fluids but you should avoid alcoholic beverages for 24  hours.  ACTIVITY:  You should plan to take it easy for the rest of today and you should NOT DRIVE or use heavy machinery until tomorrow (because of the sedation medicines used during the test).    FOLLOW UP: Our staff will call the number listed on your records the next business day following your procedure.  We will call around 7:15- 8:00 am to check on you and address any questions or concerns that you may have regarding the information given to you following your procedure. If we do not reach you, we will leave a message.     If any biopsies were taken you will be contacted by phone or by letter within the next 1-3 weeks.  Please call us at [phone] if you have not heard about the biopsies in 3 weeks.    SIGNATURES/CONFIDENTIALITY: You and/or your care partner have signed paperwork which will be entered into your electronic medical record.  These signatures attest to the fact that that the information above on your After Visit Summary has been reviewed and is understood.  Full responsibility of the confidentiality of this discharge information lies with you and/or your care-partner.

## 2024-05-06 ENCOUNTER — Telehealth: Payer: Self-pay

## 2024-05-06 ENCOUNTER — Ambulatory Visit: Admitting: Pharmacist

## 2024-05-06 ENCOUNTER — Encounter: Payer: Self-pay | Admitting: Pharmacist

## 2024-05-06 VITALS — BP 144/58 | HR 74 | Wt 182.0 lb

## 2024-05-06 DIAGNOSIS — Z794 Long term (current) use of insulin: Secondary | ICD-10-CM

## 2024-05-06 DIAGNOSIS — E1142 Type 2 diabetes mellitus with diabetic polyneuropathy: Secondary | ICD-10-CM | POA: Diagnosis not present

## 2024-05-06 MED ORDER — INSULIN ASPART (W/NIACINAMIDE) 100 UNIT/ML ~~LOC~~ SOPN: PEN_INJECTOR | SUBCUTANEOUS | Status: DC

## 2024-05-06 NOTE — Progress Notes (Signed)
 Reviewed and agree with Dr Macky Lower plan.

## 2024-05-06 NOTE — Progress Notes (Signed)
 S:     Chief Complaint  Patient presents with   Medication Management    Diabetes / Anxiety Control   68 y.o. female who presents for diabetes evaluation, education, and management. Patient arrives walking with assistance of a cane.     Patient was referred and last seen by Primary Care Provider, Dr. Mikeal Alder on 04/30/2024.  At last visit, buspar  dose was increase and patient reports modest improvement in lower anxiety over the past several days.    PMH is significant for DM, HTN, Hyperlipidemia.   Current diabetes medications include: Farxiga  (dapagliflozin ) 10 mg, Tresiba  (insulin  degludec) 60 units, Fiasp  (insulin  aspart) 12-18 units TID, Ozempic  (semaglutide ) 2 mg Current hypertension medications include: Lisinopril  40 mg, metoprolol  succinate 50 mg  Current hyperlipidemia medications include: atorvastatin  40 mg, Zetia  (ezetimibe ) 10 mg  Patient reports adherence to taking all medications as prescribed.  No medication shortages at this time.   Do you feel that your medications are working for you? yes Have you been experiencing any side effects to the medications prescribed? no Do you have any problems obtaining medications due to transportation or finances? None currently with [facility].    Patient denies hypoglycemic events.  Patient reported dietary habits: Eats 3 meals/day   Patient-reported exercise habits: limited due to anxiety  O:   Review of Systems  Psychiatric/Behavioral:  The patient is nervous/anxious.     Physical Exam Vitals reviewed.  Constitutional:      Appearance: Normal appearance.  Pulmonary:     Effort: Pulmonary effort is normal.   Musculoskeletal:     Right lower leg: No edema.     Left lower leg: No edema.   Neurological:     Mental Status: She is alert.   Psychiatric:        Mood and Affect: Mood normal.        Thought Content: Thought content normal.        Judgment: Judgment normal.     Dexcom CGM Download today 05/06/2024 %  Time CGM is active: 99% Average Glucose: 257 mg/dL Glucose Management Indicator: 9.5  Glucose Variability: 21.7% (goal <36%) Time in Goal:  - Time in range 70-180: 6% - Time above range: 94% 43% were 180-250 - Time below range: 0% Observed patterns: consistent elevation throughout the entire day with minimal readings <150  Lab Results  Component Value Date   HGBA1C 8.5 (A) 04/30/2024   Vitals:   05/06/24 1014 05/06/24 1023  BP: (!) 142/60 (!) 144/58  Pulse: 74   SpO2: 95%     Lipid Panel     Component Value Date/Time   CHOL 91 (L) 10/11/2023 0859   TRIG 96 10/11/2023 0859   HDL 44 10/11/2023 0859   CHOLHDL 2.1 10/11/2023 0859   CHOLHDL 2.9 07/31/2019 1339   VLDL 38.2 07/18/2018 0940   LDLCALC 28 10/11/2023 0859   LDLCALC 48 07/31/2019 1339   LDLDIRECT 129 (H) 03/18/2023 1715     A/P: Diabetes longstanding and currently with worsened control most likely related to increased anxiety which is currently under treatment and improving gradually.  Medication adherence appears good. Control is suboptimal due to stress of anxiety, decreased activity and inadequate medication regimen. -Continued basal insulin  Tresiba  (insulin  degludec) at 60 units once daily in the morning.  -Adjusted dose of rapid insulin  Fiasp  (insulin  aspart) by 2 units with each meal on her adjusted mealtime scale.  -Continued GLP-1 Ozempic  (semaglutide ) 2 mg weekly  -Continued SGLT2-I Farxiga  (dapagliflozin ) 10 mg.  Counseled on sick day rules. -Patient educated on purpose, proper use, and potential adverse effects.  -Extensively discussed pathophysiology of diabetes, recommended lifestyle interventions, dietary effects on blood sugar control.  Encouraged more activity and enjoyable exercise.  -Counseled on s/sx of and management of hypoglycemia.   Written patient instructions provided. Patient verbalized understanding of treatment plan.  Total time in face to face counseling 26 minutes.    Follow-up:   Pharmacist PRN PCP clinic visit in 4-6 weeks with Dr. Annabell Key

## 2024-05-06 NOTE — Assessment & Plan Note (Signed)
 Diabetes longstanding and currently with worsened control most likely related to increased anxiety which is currently under treatment and improving gradually.  Medication adherence appears good. Control is suboptimal due to stress of anxiety, decreased activity and inadequate medication regimen. -Continued basal insulin  Tresiba  (insulin  degludec) at 60 units once daily in the morning.  -Adjusted dose of rapid insulin  Fiasp  (insulin  aspart) by 2 units with each meal on her adjusted mealtime scale.  -Continued GLP-1 Ozempic  (semaglutide ) 2 mg weekly  -Continued SGLT2-I Farxiga  (dapagliflozin ) 10 mg. Counseled on sick day rules. -Patient educated on purpose, proper use, and potential adverse effects.  -Extensively discussed pathophysiology of diabetes, recommended lifestyle interventions, dietary effects on blood sugar control.  Encouraged more activity and enjoyable exercise.  -Counseled on s/sx of and management of hypoglycemia.

## 2024-05-06 NOTE — Patient Instructions (Signed)
 It was nice to see you today!  Remain active.  As we discussed the buspar  will continue to help and should improve things over the next few weeks.  Your goal blood sugar is 80-130 before eating and less than 180 after eating.  Medication Changes: Increase  meal-time insulin  by 2 units with each meal   Continue all other medication the same.   Monitor blood sugars at home and keep a log (glucometer or piece of paper) to bring with you to your next visit.  Keep up the good work with diet and exercise. Aim for a diet full of vegetables, fruit and lean meats (chicken, [place], fish). Try to limit salt intake by eating fresh or frozen vegetables (instead of canned), rinse canned vegetables prior to cooking and do not add any additional salt to meals.   Next visit with Dr. Annabell Key in late July or early August.

## 2024-05-06 NOTE — Telephone Encounter (Signed)
  Follow up Call-     05/05/2024    7:24 AM 05/05/2024    7:12 AM  Call back number  Post procedure Call Back phone  # [phone]  Permission to leave phone message Yes     Post op call attempted, no answer, left VM.

## 2024-05-07 DIAGNOSIS — H524 Presbyopia: Secondary | ICD-10-CM | POA: Diagnosis not present

## 2024-05-07 LAB — SURGICAL PATHOLOGY

## 2024-05-08 ENCOUNTER — Ambulatory Visit: Payer: Self-pay | Admitting: Gastroenterology

## 2024-05-10 ENCOUNTER — Other Ambulatory Visit: Payer: Self-pay | Admitting: Cardiology

## 2024-05-12 ENCOUNTER — Telehealth: Payer: Self-pay

## 2024-05-12 NOTE — Telephone Encounter (Signed)
 Novo Nordisk refill/dose change app in Dr. Serena box awaiting signature.   For Ozempic  2mg , Fiasp  pens, and Tresiba  U100 pens.

## 2024-05-19 NOTE — Telephone Encounter (Signed)
 Medication picked up at appt.

## 2024-05-19 NOTE — Telephone Encounter (Signed)
 Form signed by Dr. Donzetta.   Faxed to [facility] 05/19/24.

## 2024-05-21 ENCOUNTER — Other Ambulatory Visit: Payer: Self-pay | Admitting: *Deleted

## 2024-05-21 DIAGNOSIS — D751 Secondary polycythemia: Secondary | ICD-10-CM

## 2024-05-24 NOTE — Assessment & Plan Note (Deleted)
 Lab review 06/26/2022: Hemoglobin 15, hematocrit 47 03/18/2023: Erythropoietin : 13, JAK2 mutation: Negative 05/20/2023: Hemoglobin 17.1, hematocrit 52.1, WBC 7.5, platelets 189 08/08/2023: Hemoglobin 16.5, hematocrit 47.9 09/26/23: Hb 14.8, Hct: 44.8 04/21/24:    the patient has secondary polycythemia.  Possibly related to her lung condition. Phlebotomy: 08/12/2023, 11/26/2023   Goal of phlebotomy: Hematocrit below 45 Return to clinic in 3 month with labs and follow-up

## 2024-05-25 ENCOUNTER — Inpatient Hospital Stay: Payer: Medicare HMO | Admitting: Hematology and Oncology

## 2024-05-25 ENCOUNTER — Inpatient Hospital Stay: Payer: Medicare HMO

## 2024-05-25 ENCOUNTER — Inpatient Hospital Stay: Payer: Medicare HMO | Attending: Hematology and Oncology

## 2024-05-25 DIAGNOSIS — Z79899 Other long term (current) drug therapy: Secondary | ICD-10-CM | POA: Diagnosis not present

## 2024-05-25 DIAGNOSIS — D751 Secondary polycythemia: Secondary | ICD-10-CM | POA: Insufficient documentation

## 2024-05-25 LAB — CBC WITH DIFFERENTIAL (CANCER CENTER ONLY)
Abs Immature Granulocytes: 0.02 K/uL (ref 0.00–0.07)
Basophils Absolute: 0.1 K/uL (ref 0.0–0.1)
Basophils Relative: 1 %
Eosinophils Absolute: 0.1 K/uL (ref 0.0–0.5)
Eosinophils Relative: 2 %
HCT: 40.8 % (ref 36.0–46.0)
Hemoglobin: 13.3 g/dL (ref 12.0–15.0)
Immature Granulocytes: 0 %
Lymphocytes Relative: 18 %
Lymphs Abs: 1.2 K/uL (ref 0.7–4.0)
MCH: 25.7 pg — ABNORMAL LOW (ref 26.0–34.0)
MCHC: 32.6 g/dL (ref 30.0–36.0)
MCV: 78.9 fL — ABNORMAL LOW (ref 80.0–100.0)
Monocytes Absolute: 0.4 K/uL (ref 0.1–1.0)
Monocytes Relative: 6 %
Neutro Abs: 4.8 K/uL (ref 1.7–7.7)
Neutrophils Relative %: 73 %
Platelet Count: 176 K/uL (ref 150–400)
RBC: 5.17 MIL/uL — ABNORMAL HIGH (ref 3.87–5.11)
RDW: 15.9 % — ABNORMAL HIGH (ref 11.5–15.5)
WBC Count: 6.5 K/uL (ref 4.0–10.5)
nRBC: 0 % (ref 0.0–0.2)

## 2024-05-25 LAB — CMP (CANCER CENTER ONLY)
ALT: 35 U/L (ref 0–44)
AST: 34 U/L (ref 15–41)
Albumin: 4.1 g/dL (ref 3.5–5.0)
Alkaline Phosphatase: 83 U/L (ref 38–126)
Anion gap: 7 (ref 5–15)
BUN: 12 mg/dL (ref 8–23)
CO2: 25 mmol/L (ref 22–32)
Calcium: 10.3 mg/dL (ref 8.9–10.3)
Chloride: 106 mmol/L (ref 98–111)
Creatinine: 1.18 mg/dL — ABNORMAL HIGH (ref 0.44–1.00)
GFR, Estimated: 51 mL/min — ABNORMAL LOW (ref >60)
Glucose, Bld: 203 mg/dL — ABNORMAL HIGH (ref 70–99)
Potassium: 4 mmol/L (ref 3.5–5.1)
Sodium: 138 mmol/L (ref 135–145)
Total Bilirubin: 0.4 mg/dL (ref 0.0–1.2)
Total Protein: 6.6 g/dL (ref 6.5–8.1)

## 2024-05-25 NOTE — Progress Notes (Signed)
 HCT 40.8. Pt does not meet parameters for phlebotomy. Pt given a copy of labs and discharged.

## 2024-05-27 NOTE — Telephone Encounter (Signed)
 Rec'd 4 boxes of Ozempic  2mg  dose pens.

## 2024-05-28 ENCOUNTER — Encounter: Payer: Self-pay | Admitting: Student

## 2024-05-28 ENCOUNTER — Ambulatory Visit: Admitting: Student

## 2024-05-28 VITALS — BP 114/54 | HR 79 | Wt 180.4 lb

## 2024-05-28 DIAGNOSIS — F411 Generalized anxiety disorder: Secondary | ICD-10-CM

## 2024-05-28 DIAGNOSIS — Z794 Long term (current) use of insulin: Secondary | ICD-10-CM

## 2024-05-28 DIAGNOSIS — E1142 Type 2 diabetes mellitus with diabetic polyneuropathy: Secondary | ICD-10-CM | POA: Diagnosis not present

## 2024-05-28 NOTE — Progress Notes (Signed)
    SUBJECTIVE:   CHIEF COMPLAINT / HPI:   Cassandra Reid is a 68 y.o. female presenting for follow-up for diabetes and mental health.  Type 2 diabetes: Medication: Tresiba  60 units daily, Aspart 6-20 units TID with meals, Ozempic  2 mg weekly, Farxiga  10 mg daily Last A1c 8.5, 1 month ago She reports she continues to have elevated fasting blood sugars in the mornings ranging from 2-300.  She has had a few mornings with lower sugars with the lowest being 134 fasting.  PERTINENT  PMH / PSH: reviewed and updated.  OBJECTIVE:   BP (!) 114/54   Pulse 79   Wt 180 lb 6.4 oz (81.8 kg)   SpO2 99%   BMI 34.09 kg/m   Well-appearing, no acute distress Cardio: Regular rate, regular rhythm, no murmurs on exam. Pulm: Clear, no wheezing, no crackles. No increased work of breathing Abdominal: bowel sounds present, soft, non-tender, non-distended Extremities: no peripheral edema   ASSESSMENT/PLAN:   Assessment & Plan Type 2 diabetes mellitus with diabetic polyneuropathy, with long-term current use of insulin  Shared decision making with patient to adjust long-acting insulin  to 32 units twice daily.  Will continue sliding scale aspart 16 to 20 units with meals.  Also continue Ozempic  2 mg weekly and Farxiga  10 mg daily.  Patient will keep track of her sugars via CGM and will let me know if they get too low.  Recommend that she follow-up in 3 months for recheck of A1c. Generalized anxiety disorder Doing well.  Patient reports she has been going through a tough time recently but is feeling stable.  PHQ-9 is high but stable from prior readings.  She denies active thoughts of SI.     Damien Pinal, DO [facility]

## 2024-05-28 NOTE — Patient Instructions (Signed)
 It was great to see you today!   I am adjusting your insulin  to 32 units twice daily. Keep track of your sugars and let me know how things go. Continue your other medications as prescribed   Future Appointments  Date Time Provider Department Center  06/01/2024  2:20 PM [facility] ANNUAL [facility]DIE VISIT [facility]  06/08/2024  9:00 AM Kapoor, Sahil, MD [facility] None    Please arrive 15 minutes before your appointment to ensure smooth check in process.    Please call the clinic at [phone] if your symptoms worsen or you have any concerns.  Thank you for allowing me to participate in your care, Dr. Damien Pinal [facility]

## 2024-05-28 NOTE — Assessment & Plan Note (Addendum)
 Shared decision making with patient to adjust long-acting insulin  to 32 units twice daily.  Will continue sliding scale aspart 16 to 20 units with meals.  Also continue Ozempic  2 mg weekly and Farxiga  10 mg daily.  Patient will keep track of her sugars via CGM and will let me know if they get too low.  Recommend that she follow-up in 3 months for recheck of A1c.

## 2024-05-28 NOTE — Assessment & Plan Note (Addendum)
 Doing well.  Patient reports she has been going through a tough time recently but is feeling stable.  PHQ-9 is high but stable from prior readings.  She denies active thoughts of SI.

## 2024-06-01 ENCOUNTER — Ambulatory Visit: Payer: Medicare PPO

## 2024-06-01 VITALS — Ht 61.0 in | Wt 177.0 lb

## 2024-06-01 DIAGNOSIS — Z0000 Encounter for general adult medical examination without abnormal findings: Secondary | ICD-10-CM

## 2024-06-01 NOTE — Progress Notes (Cosign Needed)
 Psychiatric Initial Adult Assessment  Patient Identification: Cassandra Reid MRN:  [id number] Date of Evaluation:  06/08/2024 Referral Source: Family medicine (Dr. Vertie)  Assessment:  Cassandra Reid is a 68 y.o. female with a history of anxiety, MDD who presents in person to [facility] for initial mental health evaluation.  Patient has a diagnosis of depression and anxiety and has been having worsening depression and anxiety in the setting of psychosocial stressors severe's that includes living by herself, not having a good social support and social life, her medical history that includes brain surgery, having neuropathy.  All these factors have been contributing to her depressed mood, and worsening anxiety and decreased or disturbed sleep.  She has been eating well, has been independent for her ADLs.  She was alert and oriented, has not been using alcohol, cigarettes or any substances.  Some supportive therapy was done during the current visit and she reported an improvement in her mood.  She has been making changes to her lifestyle like looking for better housing, traveling by herself and reengaging herself in cooking which she enjoys.  Though her pain is the primary factor that she believes worsens her anxiety, discussed about increasing gabapentin  to 300 mg 3 times daily.  She reports an improvement in her mood, pain and anxiety on duloxetine , gabapentin  and Ativan  as needed which is provided by her primary care provider.  Discussed therapy with the patient, patient amenable to starting that in the clinic in person.  Resources for therapy were provided.  Patient encouraged to seek therapy with the current author, patient amenable.  The prescription for gabapentin  300 mg 3 times daily was sent to the preferred pharmacy, encouraged to follow up in the clinic in 4 weeks, patient encouraged to call the clinic if she needed an earlier appointment.   Risk Assessment: A suicide and  violence risk assessment was performed as part of this evaluation. There patient is deemed to be at chronic elevated risk for self-harm/suicide given the following factors: N/A. These risk factors are mitigated by the following factors: lack of active SI/HI, no known access to weapons or firearms, no history of previous suicide attempts, no history of violence, motivation for treatment, utilization of positive coping skills, and supportive family, sense of responsibility to family and social supports. The patient is deemed to be at chronic elevated risk for violence given the following factors: N/A. These risk factors are mitigated by the following factors: N/A. There is no  acute risk for suicide or violence at this time. The patient was educated about relevant modifiable risk factors including following recommendations for treatment of psychiatric illness and abstaining from substance abuse.  While future psychiatric events cannot be accurately predicted, the patient does not  currently require  acute inpatient psychiatric care and does not currently meet [place]  involuntary commitment criteria.    Plan:  # Generalized anxiety disorder Past medication trials: Buspirone , Ativan  Status of problem: Chronic Interventions: -- Continue Cymbalta  60 mg daily -- Continue Ativan  0.5 mg as needed, patient's primary care provider prescribes it -- Increase gabapentin  to 300 mg 3 times daily -- Encouraged therapy, resources provided, patient informed about having therapy scheduled with the author.  # MDD Past medication trials: Prozac , duloxetine , Lexapro  Status of problem: Chronic Interventions: -- Continue Cymbalta  60 mg daily -- Encouraged therapy, resources provided, patient informed about having therapy scheduled with the author.  # Neuropathy Past medication trials:  Status of problem: Chronic Interventions: -- Continue Cymbalta   60 mg daily -- Increase gabapentin  to 300 mg 3 times  daily -- Continue pain management per neurologist  Return to care in 4 weeks  Patient was given contact information for behavioral health clinic and was instructed to call 911 for emergencies.    Patient and plan of care will be discussed with the Attending MD ,Dr. Mercy, who agrees with the above statement and plan.   Subjective:  Chief Complaint:  Chief Complaint  Patient presents with   Establish Care   Depression   Anxiety   Stress    History of Present Illness:    Cassandra Reid is a 68 year old female with a psychiatric history of depression, anxiety that presented to the clinic for psychiatric management for worsening of anxiety and depression.  The patient reported that her mood was anxious , when asked to elaborate she stated when I am out of house, I do not know what to say .  She reported that she has been having worsening anxiety for which her primary care provider referred her to psychiatry.  When asked about what causes her anxiety she stated everything.  Reported feeling sad , when asked to elaborate she stated I lost everything, my husband wanted to move here but then he left me as soon as he moved .  She reported that she has had brain surgery and cannot drive and has been having transportation issues.  She lives in a 2 room apartment and reports no social life.  Reported that she has a daughter here but she cannot see her by herself as she always have people around her.  Reported that she has been feeling lonely and is not dependent on anybody, travels by herself, has not asked anybody for a ride.  She reported sleep difficulties due to pain, depression and anxiety, diminished interest in daily activities, decreased energy to do daily tasks, reports that she sits on the couch all day, diminished concentration but reports decent appetite.  Reported that she gets up a lot in the night and is unable to stay asleep, reports daily sleep of about 4 hours.  She denies  SI/HI/AVH.  She reported that nothing makes me happy .  She also endorsed anxiety stating it get worse, I start crying and praying .  Reported that Cymbalta , gabapentin  and Ativan  as needed which she has taken once in 3 weeks has been helpful for anxiety.  Reported that she was on buspirone  in the past but I did not like how that made me feel, it made me feel wrong.  So she discontinued it.   She denied drinking alcohol, smoking cigarettes or using any substances reported that she used them 40 years ago.  Reported compliance on Cymbalta  60 mg daily, gabapentin  300 mg 2 times daily, Ativan  0.5 mg as needed, denies any side effects but reports that it makes me feel better .  Discussed different options about medication management, patient amenable to increasing gabapentin  to 300 mg 3 times daily and starting therapy in the clinic.  Resources for therapy provided, patient encouraged that she can be scheduled for therapy with the author.  Prescription was sent to the preferred pharmacy for 1 month, encouraged to follow up in 4 week.   Past Psychiatric History:  Diagnoses: Anxiety, MDD Medication trials: Buspirone , Ativan , Prozac  Previous psychiatrist/therapist: None  Hospitalizations: None Suicide attempts: Denies SIB: N/A Hx of violence towards others: Denies Current access to guns: Denies Hx of trauma/abuse: Denies  Substance Abuse History in the  last 12 months:  No.  Past Medical History:  Past Medical History:  Diagnosis Date   Cerebral aneurysm without rupture    CKD stage 3 due to type 2 diabetes mellitus 04/15/2019   DDD (degenerative disc disease), cervical 04/16/2019   Encephalomalacia on imaging study 08/25/2020   Brain MRI 08/25/21: Few scattered areas of encephalomalacia and gliosis within the overlying left frontal lobe most likely postoperative in nature.   Fatty liver 05/31/2020   Generalized anxiety disorder    GERD (gastroesophageal reflux disease)     Hyperlipidemia associated with type 2 diabetes mellitus 07/18/2018   Hypertension associated with diabetes 07/18/2018   Incontinence 09/22/2021   Bowel and bladder.    Legally blind    Major depressive disorder    Osteoporosis 01/18/2021   Paroxysmal atrial tachycardia (HCC)    On telemetry   Type 2 diabetes mellitus with diabetic polyneuropathy, with long-term current use of insulin  07/18/2018   has dexcom G7  - sensor left upper arm    Past Surgical History:  Procedure Laterality Date   ABDOMINAL HYSTERECTOMY  2012   APPENDECTOMY  1972   CEREBRAL ANEURYSM REPAIR Left 2004   Aneurysm clip in place near the level of the left ICA on Brain MRI 08/2020. Also coiling performed   CHOLECYSTECTOMY  2015   COLONOSCOPY  11/24/2020   in [place]   CRANIOTOMY Left    left pterional craniotomy   ETHMOIDECTOMY Left 11/23/2022   Procedure: LEFT ANTERIOR ETHMOIDECTOMY;  Surgeon: Llewellyn Gerard LABOR, DO;  Location: [facility] OR;  Service: ENT;  Laterality: Left;   EYE SURGERY  2023   MAXILLARY ANTROSTOMY Left 11/23/2022   Procedure: MAXILLARY ANTROSTOMY;  Surgeon: Llewellyn Gerard LABOR, DO;  Location: [facility] OR;  Service: ENT;  Laterality: Left;   MENISCUS REPAIR Bilateral    NASAL TURBINATE REDUCTION Bilateral 11/23/2022   Procedure: Bilateral turbinate reduction;  Surgeon: Llewellyn Gerard LABOR, DO;  Location: [facility] OR;  Service: ENT;  Laterality: Bilateral;   SINUS ENDO W/FUSION Left 11/23/2022   Procedure: Functional endoscopic sinus surgery with navigation.;  Surgeon: Llewellyn Gerard LABOR, DO;  Location: [facility] OR;  Service: ENT;  Laterality: Left;   TUBAL LIGATION  1990   UPPER GI ENDOSCOPY  11/24/2020   in [place]    Family Psychiatric History: Unable to elaborate  Family History:  Family History  Problem Relation Age of Onset   Alcohol abuse Mother    Diabetes Mother    Hypertension Mother    Kidney disease Mother    COPD Father    Diabetes Father    Early death Father    Heart disease Father     Hyperlipidemia Father    Hypertension Father    Diabetes Sister    Hypertension Sister    Breast cancer Sister    Diabetes Sister    Hypertension Sister    Kidney disease Sister    Cancer Maternal Grandmother    Alcohol abuse Maternal Grandfather    Cancer Maternal Grandfather    Cancer Paternal Grandmother    Cancer Paternal Grandfather     Social History:   Academic/Vocational: N/A Social History   Socioeconomic History   Marital status: Divorced    Spouse name: John   Number of children: 3   Years of education: 14   Highest education level: Associate degree: academic program  Occupational History   Occupation: disabled  Tobacco Use   Smoking status: Former    Current packs/day: 0.00    Types: Cigarettes  Quit date: 06/1985    Years since quitting: 38.9    Passive exposure: Past   Smokeless tobacco: Never  Vaping Use   Vaping status: Never Used  Substance and Sexual Activity   Alcohol use: Never   Drug use: Not Currently    Types: Cocaine, Marijuana    Comment: 1976-1980   Sexual activity: Not Currently    Birth control/protection: Surgical    Comment: Hysterectomy  Other Topics Concern   Not on file  Social History Narrative   Patient lives in [place].    Patient walks her dog daily for exercise.    Patient is separated from her husband.   Patient does not drive. Uses home delivery services and ub[employer].    Right handed   One story apartment   Caffeine on daily   Social Drivers of Health   Financial Resource Strain: Medium Risk (05/05/2024)   Overall Financial Resource Strain (CARDIA)    Difficulty of Paying Living Expenses: Somewhat hard  Food Insecurity: No Food Insecurity (05/05/2024)   Hunger Vital Sign    Worried About Running Out of Food in the Last Year: Never true    Ran Out of Food in the Last Year: Never true  Transportation Needs: Unmet Transportation Needs (05/05/2024)   PRAPARE - Administrator, Civil Service (Medical): Yes     Lack of Transportation (Non-Medical): Yes  Physical Activity: Inactive (05/05/2024)   Exercise Vital Sign    Days of Exercise per Week: 0 days    Minutes of Exercise per Session: Not on file  Stress: No Stress Concern Present (05/05/2024)   [employer] of Occupational Health - Occupational Stress Questionnaire    Feeling of Stress: Only a little  Recent Concern: Stress - Stress Concern Present (03/30/2024)   Received from [facility] of Occupational Health - Occupational Stress Questionnaire    Feeling of Stress : Very much  Social Connections: Socially Isolated (05/05/2024)   Social Connection and Isolation Panel    Frequency of Communication with Friends and Family: Never    Frequency of Social Gatherings with Friends and Family: Never    Attends Religious Services: Never    Database administrator or Organizations: No    Attends Engineer, structural: Not on file    Marital Status: Divorced    Additional Social History: updated  Allergies:  No Known Allergies  Current Medications: Current Outpatient Medications  Medication Sig Dispense Refill   albuterol  (VENTOLIN  HFA) 108 (90 Base) MCG/ACT inhaler INHALE 2 PUFFS INTO THE LUNGS 2 (TWO) TIMES DAILY. PLUS 2 PUFFS AS NEEDED 18 each 1   amLODipine  (NORVASC ) 5 MG tablet TAKE 1 TABLET EVERY DAY 90 tablet 0   Ascorbic Acid (VITAMIN C ADULT GUMMIES PO) Take 1 each by mouth daily in the afternoon.     atorvastatin  (LIPITOR) 40 MG tablet Take 40 mg by mouth daily.     BD PEN NEEDLE NANO 2ND GEN 32G X 4 MM MISC USE WITH INSULIN  100 each 4   blood glucose meter kit and supplies Dispense based on patient and insurance preference. Use up to four times daily as directed. (FOR ICD-10 E10.9, E11.9). 1 each 0   budeson-glycopyrrolate-formoterol  (BREZTRI  AEROSPHERE) 160-9-4.8 MCG/ACT AERO inhaler Inhale 2 puffs into the lungs in the morning and at bedtime. 3 each 3   busPIRone  (BUSPAR ) 10 MG tablet Take 1 tablet  (10 mg total) by mouth 3 (three) times daily. 90 tablet 2  Continuous Blood Gluc Sensor (DEXCOM G6 SENSOR) MISC Use as directed to check blood sugar. 4 each 11   Continuous Blood Gluc Transmit (DEXCOM G6 TRANSMITTER) MISC Use as directed to check blood sugar. 1 each 12   dapagliflozin  propanediol (FARXIGA ) 10 MG TABS tablet Take 1 tablet (10 mg total) by mouth daily. 90 tablet 3   DULoxetine  (CYMBALTA ) 60 MG capsule TAKE 1 CAPSULE EVERY DAY 90 capsule 3   ezetimibe  (ZETIA ) 10 MG tablet TAKE 1 TABLET EVERY DAY 90 tablet 3   famotidine  (PEPCID ) 20 MG tablet Take 1 tablet (20 mg total) by mouth daily. 90 tablet 3   FIBER SELECT GUMMIES PO Take 1 tablet by mouth daily in the afternoon.     fluticasone  (FLONASE ) 50 MCG/ACT nasal spray USE 1 SPRAY INTO BOTH NOSTRILS DAILY AS NEEDED FOR ALLERGIES OR RHINITIS. 16 g 5   furosemide  (LASIX ) 40 MG tablet Take 1 tablet (40 mg total) by mouth as needed for edema.     gabapentin  (NEURONTIN ) 600 MG tablet TAKE 0.5-1 TABLET (300-600 MG TOTAL) BY MOUTH 3 (THREE) TIMES DAILY. 270 tablet 3   insulin  aspart (FIASP ) 100 UNIT/ML FlexTouch Pen Inject 18-20 Units into the skin daily with breakfast AND 16-18 Units daily with lunch AND 14-16 Units daily with supper. This is a patient assistance medication..     Insulin  Pen Needle (NOVOFINE PLUS PEN NEEDLE) 32G X 4 MM MISC Inject 1 Device into the skin as directed. 100 each 11   lisinopril  (ZESTRIL ) 40 MG tablet TAKE 1 TABLET EVERY DAY 90 tablet 3   loratadine  (CLARITIN ) 10 MG tablet TAKE 1 TABLET EVERY DAY 90 tablet 3   LORazepam  (ATIVAN ) 0.5 MG tablet TAKE 1 TABLET EVERY 8 HOURS AS NEEDED FOR ANXIETY 90 tablet 0   metoprolol  succinate (TOPROL -XL) 50 MG 24 hr tablet TAKE 1 TABLET (50 MG TOTAL) BY MOUTH DAILY. TAKE WITH OR IMMEDIATELY FOLLOWING A MEAL. 90 tablet 3   Multiple Vitamin (MULTIVITAMIN WITH MINERALS) TABS tablet Take 1 tablet by mouth in the morning.     OneTouch Delica Lancets 33G MISC Please use to check blood  sugar up to 4 times daily. E11.42 100 each 12   Oyster Shell (OYSTER CALCIUM ) 500 MG TABS tablet Take 500 mg of elemental calcium  by mouth daily in the afternoon.     pantoprazole  (PROTONIX ) 40 MG tablet TAKE 1 TABLET EVERY DAY 90 tablet 3   potassium chloride  SA (KLOR-CON  M) 20 MEQ tablet TAKE 1 TABLET AS NEEDED (TAKE AS DIRECTED WITH LASIX  AS NEEDED FOR EDEMA) 90 tablet 1   Semaglutide , 2 MG/DOSE, (OZEMPIC , 2 MG/DOSE,) 8 MG/3ML SOPN Inject 2 mg into the skin once a week. 3 mL 11   TRESIBA  FLEXTOUCH 100 UNIT/ML FlexTouch Pen Inject 32 Units into the skin 2 (two) times daily.     TRUE METRIX BLOOD GLUCOSE TEST test strip TEST BLOOD SUGAR AS INSTRUCTED 350 strip 3   Current Facility-Administered Medications  Medication Dose Route Frequency Provider Last Rate Last Admin   0.9 %  sodium chloride  infusion  500 mL Intravenous Once Danis, Henry L III, MD        ROS: Review of Systems  Constitutional:  Negative for activity change, appetite change, chills, diaphoresis and fatigue.  HENT:  Negative for congestion, dental problem, drooling, ear discharge and ear pain.   Eyes:  Negative for pain, discharge and itching.  Respiratory:  Negative for apnea, cough, choking and chest tightness.   Cardiovascular:  Negative for  chest pain, palpitations and leg swelling.  Gastrointestinal:  Negative for abdominal distention, abdominal pain, constipation, diarrhea and nausea.  Endocrine: Negative for cold intolerance and heat intolerance.  Genitourinary:  Negative for difficulty urinating, dysuria, flank pain, frequency, hematuria and urgency.  Musculoskeletal:  Negative for arthralgias, back pain, gait problem, joint swelling, myalgias and neck pain.  Skin:  Negative for color change and pallor.  Allergic/Immunologic: Negative for environmental allergies and food allergies.  Neurological:  Negative for dizziness, seizures, syncope, facial asymmetry, speech difficulty, light-headedness, numbness and headaches.   Psychiatric/Behavioral:  Positive for dysphoric mood and sleep disturbance. Negative for agitation, behavioral problems, confusion, decreased concentration, hallucinations, self-injury and suicidal ideas. The patient is nervous/anxious. The patient is not hyperactive.     Objective:  Psychiatric Specialty Exam: Blood pressure 134/78, pulse 76, height 5' 1.2 (1.554 m), weight 179 lb (81.2 kg).Body mass index is 33.6 kg/m.  General Appearance: Casual  Eye Contact:  Good  Speech:  Clear and Coherent and Normal Rate  Volume:  Normal  Mood:  Depressed  Affect:  Depressed  Thought Content: Logical   Suicidal Thoughts:  No  Homicidal Thoughts:  No  Thought Process:  Coherent  Orientation:  Full (Time, Place, and Person)    Memory: Immediate;   Good Recent;   Fair Remote;   Fair  Judgment:  Fair  Insight:  Fair  Concentration:  Concentration: Good and Attention Span: Good  Recall:  not formally assessed   Fund of Knowledge: Good  Language: Good  Psychomotor Activity:  Normal  Akathisia:  No  AIMS (if indicated): not done  Assets:  Communication Skills Desire for Improvement Financial Resources/Insurance Housing Resilience Vocational/Educational  ADL's:  Intact  Cognition: WNL  Sleep:  Fair   PE: General: well-appearing; no acute distress  Pulm: no increased work of breathing on room air  Strength & Muscle Tone: within normal limits Neuro: no focal neurological deficits observed  Gait & Station: normal  Metabolic Disorder Labs: Lab Results  Component Value Date   HGBA1C 8.5 (A) 04/30/2024   MPG 157 11/23/2022   MPG 249 10/29/2019   No results found for: PROLACTIN Lab Results  Component Value Date   CHOL 91 (L) 10/11/2023   TRIG 96 10/11/2023   HDL 44 10/11/2023   CHOLHDL 2.1 10/11/2023   VLDL 61.7 07/18/2018   LDLCALC 28 10/11/2023   LDLCALC 92 06/26/2022   Lab Results  Component Value Date   TSH 1.500 06/26/2022    Therapeutic Level Labs: No  results found for: LITHIUM No results found for: CBMZ No results found for: VALPROATE  Screenings:  GAD-7    Flowsheet Row Office Visit from 04/30/2024 in [facility] - A Dept Of [place]. [facility] Office Visit from 06/05/2021 in [facility] - A Dept Of [place]. [facility] Video Visit from 09/08/2020 in [facility] at [facility] Office Visit from 05/26/2020 in [facility] at [facility] Video Visit from 02/04/2020 in [facility] Primary Care & Sports Medicine at [facility]  Total GAD-7 Score 15 17 18 15 16    PHQ2-9    Flowsheet Row Office Visit from 06/08/2024 in [facility] Clinical Support from 06/01/2024 in [facility] - A Dept Of [place]. [facility] Office Visit from 05/28/2024 in [facility] - A Dept Of [place].  [facility] Office Visit from 04/30/2024 in [facility] - A Dept Of [place]. [facility] Office Visit from 03/10/2024 in [facility] - A Dept Of [place]. [facility]  PHQ-2 Total Score 4 4 4 5 6   PHQ-9 Total Score 12 12 14 12 12    Flowsheet Row ED from 02/12/2023 in [facility] Emergency Department at [facility] Admission (Discharged) from 11/23/2022 in [facility] ED from 04/17/2022 in [facility] Emergency Department at [facility]  C-SSRS RISK CATEGORY No Risk No Risk No Risk    Collaboration of Care: Collaboration of Care: Other Dr. Mercy  Patient/Guardian was advised Release of Information must be obtained prior to any record release in order to collaborate their care with an outside provider. Patient/Guardian was advised if they have not already done so to contact the registration department to sign all necessary forms in order for us  to release  information regarding their care.   Consent: Patient/Guardian gives verbal consent for treatment and assignment of benefits for services provided during this visit. Patient/Guardian expressed understanding and agreed to proceed.   Tiffini Blacksher, MD 7/21/202510:57 AM

## 2024-06-01 NOTE — Patient Instructions (Signed)
 [name] , Thank you for taking time out of your busy schedule to complete your Annual Wellness Visit with me. I enjoyed our conversation and look forward to speaking with you again next year. I, as well as your care team,  appreciate your ongoing commitment to your health goals. Please review the following plan we discussed and let me know if I can assist you in the future. Your Game plan/ To Do List    Referrals: If you haven't heard from the office you've been referred to, please reach out to them at the phone provided.   Follow up Visits: Next Medicare AWV with our clinical staff: 06/03/2024 at 2:50 p.m. phone visit with Nurse   Have you seen your provider in the last 6 months (3 months if uncontrolled diabetes)? Yes Next Office Visit with your provider: Patient will call to schedule  Clinician Recommendations:  Aim for 30 minutes of exercise or brisk walking, 6-8 glasses of water, and 5 servings of fruits and vegetables each day.       This is a list of the screening recommended for you and due dates:  Health Maintenance  Topic Date Due   Pneumococcal Vaccine for age over 60 (1 of 2 - PCV) 09[birth year]04/1976   Flu Shot  06/19/2024   Hemoglobin A1C  10/30/2024   Complete foot exam   12/30/2024   Yearly kidney health urinalysis for diabetes  02/05/2025   Yearly kidney function blood test for diabetes  05/25/2025   Medicare Annual Wellness Visit  06/01/2025   DTaP/Tdap/Td vaccine (2 - Td or Tdap) 01/23/2026   Mammogram  04/16/2026   Colon Cancer Screening  05/06/2027   DEXA scan (bone density measurement)  Completed   Hepatitis C Screening  Completed   Zoster (Shingles) Vaccine  Completed   Hepatitis B Vaccine  Aged Out   HPV Vaccine  Aged Out   Meningitis B Vaccine  Aged Out   Eye exam for diabetics  Discontinued   COVID-19 Vaccine  Discontinued    Advanced directives: (Copy Requested) Please bring a copy of your health care power of attorney and living will to the office to be added  to your chart at your convenience. You can mail to [facility] [address] [place], [place] [address] or email to [email or web address] Advance Care Planning is important because it:  [x]  Makes sure you receive the medical care that is consistent with your values, goals, and preferences  [x]  It provides guidance to your family and loved ones and reduces their decisional burden about whether or not they are making the right decisions based on your wishes.  Follow the link provided in your after visit summary or read over the paperwork we have mailed to you to help you started getting your Advance Directives in place. If you need assistance in completing these, please reach out to us  so that we can help you!  See attachments for Preventive Care and Fall Prevention Tips.

## 2024-06-01 NOTE — Progress Notes (Signed)
 Because this visit was a virtual/telehealth visit,  certain criteria was not obtained, such a blood pressure, CBG if applicable, and timed get up and go. Any medications not marked as taking were not mentioned during the medication reconciliation part of the visit. Any vitals not documented were not able to be obtained due to this being a telehealth visit or patient was unable to self-report a recent blood pressure reading due to a lack of equipment at home via telehealth. Vitals that have been documented are verbally provided by the patient.   Subjective:   Cassandra Reid is a 68 y.o. who presents for a Medicare Wellness preventive visit.  As a reminder, Annual Wellness Visits don't include a physical exam, and some assessments may be limited, especially if this visit is performed virtually. We may recommend an in-person follow-up visit with your provider if needed.  Visit Complete: Virtual I connected with  Cassandra Reid on 06/01/24 by a audio enabled telemedicine application and verified that I am speaking with the correct person using two identifiers.  Patient Location: Home  Provider Location: Home Office  I discussed the limitations of evaluation and management by telemedicine. The patient expressed understanding and agreed to proceed.  Vital Signs: Because this visit was a virtual/telehealth visit, some criteria may be missing or patient reported. Any vitals not documented were not able to be obtained and vitals that have been documented are patient reported.  VideoDeclined- This patient declined Librarian, academic. Therefore the visit was completed with audio only.  Persons Participating in Visit: Patient.  AWV Questionnaire: Yes: Patient Medicare AWV questionnaire was completed by the patient on 05/30/2024; I have confirmed that all information answered by patient is correct and no changes since this date.        Objective:    Today's Vitals    06/01/24 1424  Weight: 177 lb (80.3 kg)  Height: 5' 1 (1.549 m)  PainSc: 5   PainLoc: Generalized   Body mass index is 33.44 kg/m.     06/01/2024    2:29 PM 03/10/2024    9:38 AM 01/31/2024    1:59 PM 01/29/2024   10:17 AM 12/31/2023   11:04 AM 10/15/2023    1:47 PM 07/02/2023   10:10 AM  Advanced Directives  Does Patient Have a Medical Advance Directive? Yes Yes Yes Yes No No No  Type of Estate agent of [place];Living will  Healthcare Power of [place];Living will Healthcare Power of [place];Living will     Does patient want to make changes to medical advance directive? --        Copy of Healthcare Power of Attorney in Chart? No - copy requested        Would patient like information on creating a medical advance directive?     No - Patient declined No - Patient declined     Current Medications (verified) Outpatient Encounter Medications as of 06/01/2024  Medication Sig   albuterol  (VENTOLIN  HFA) 108 (90 Base) MCG/ACT inhaler INHALE 2 PUFFS INTO THE LUNGS 2 (TWO) TIMES DAILY. PLUS 2 PUFFS AS NEEDED   amLODipine  (NORVASC ) 5 MG tablet TAKE 1 TABLET EVERY DAY   Ascorbic Acid (VITAMIN C ADULT GUMMIES PO) Take 1 each by mouth daily in the afternoon.   atorvastatin  (LIPITOR) 40 MG tablet Take 40 mg by mouth daily.   BD PEN NEEDLE NANO 2ND GEN 32G X 4 MM MISC USE WITH INSULIN    blood glucose meter kit and  supplies Dispense based on patient and insurance preference. Use up to four times daily as directed. (FOR ICD-10 E10.9, E11.9).   budeson-glycopyrrolate-formoterol  (BREZTRI  AEROSPHERE) 160-9-4.8 MCG/ACT AERO inhaler Inhale 2 puffs into the lungs in the morning and at bedtime.   busPIRone  (BUSPAR ) 10 MG tablet Take 1 tablet (10 mg total) by mouth 3 (three) times daily.   Continuous Blood Gluc Sensor (DEXCOM G6 SENSOR) MISC Use as directed to check blood sugar.   Continuous Blood Gluc Transmit (DEXCOM G6 TRANSMITTER) MISC Use as directed to check blood sugar.    dapagliflozin  propanediol (FARXIGA ) 10 MG TABS tablet Take 1 tablet (10 mg total) by mouth daily.   DULoxetine  (CYMBALTA ) 60 MG capsule TAKE 1 CAPSULE EVERY DAY   ezetimibe  (ZETIA ) 10 MG tablet TAKE 1 TABLET EVERY DAY   famotidine  (PEPCID ) 20 MG tablet Take 1 tablet (20 mg total) by mouth daily.   FIBER SELECT GUMMIES PO Take 1 tablet by mouth daily in the afternoon.   fluticasone  (FLONASE ) 50 MCG/ACT nasal spray USE 1 SPRAY INTO BOTH NOSTRILS DAILY AS NEEDED FOR ALLERGIES OR RHINITIS.   furosemide  (LASIX ) 40 MG tablet Take 1 tablet (40 mg total) by mouth as needed for edema.   gabapentin  (NEURONTIN ) 600 MG tablet TAKE 0.5-1 TABLET (300-600 MG TOTAL) BY MOUTH 3 (THREE) TIMES DAILY.   insulin  aspart (FIASP ) 100 UNIT/ML FlexTouch Pen Inject 18-20 Units into the skin daily with breakfast AND 16-18 Units daily with lunch AND 14-16 Units daily with supper. This is a patient assistance medication..   Insulin  Pen Needle (NOVOFINE PLUS PEN NEEDLE) 32G X 4 MM MISC Inject 1 Device into the skin as directed.   lisinopril  (ZESTRIL ) 40 MG tablet TAKE 1 TABLET EVERY DAY   loratadine  (CLARITIN ) 10 MG tablet TAKE 1 TABLET EVERY DAY   LORazepam  (ATIVAN ) 0.5 MG tablet TAKE 1 TABLET EVERY 8 HOURS AS NEEDED FOR ANXIETY   metoprolol  succinate (TOPROL -XL) 50 MG 24 hr tablet TAKE 1 TABLET (50 MG TOTAL) BY MOUTH DAILY. TAKE WITH OR IMMEDIATELY FOLLOWING A MEAL.   Multiple Vitamin (MULTIVITAMIN WITH MINERALS) TABS tablet Take 1 tablet by mouth in the morning.   OneTouch Delica Lancets 33G MISC Please use to check blood sugar up to 4 times daily. E11.42   Oyster Shell (OYSTER CALCIUM ) 500 MG TABS tablet Take 500 mg of elemental calcium  by mouth daily in the afternoon.   pantoprazole  (PROTONIX ) 40 MG tablet TAKE 1 TABLET EVERY DAY   potassium chloride  SA (KLOR-CON  M) 20 MEQ tablet TAKE 1 TABLET AS NEEDED (TAKE AS DIRECTED WITH LASIX  AS NEEDED FOR EDEMA)   Semaglutide , 2 MG/DOSE, (OZEMPIC , 2 MG/DOSE,) 8 MG/3ML SOPN Inject 2  mg into the skin once a week.   TRESIBA  FLEXTOUCH 100 UNIT/ML FlexTouch Pen Inject 32 Units into the skin 2 (two) times daily.   TRUE METRIX BLOOD GLUCOSE TEST test strip TEST BLOOD SUGAR AS INSTRUCTED   Facility-Administered Encounter Medications as of 06/01/2024  Medication   0.9 %  sodium chloride  infusion    Allergies (verified) Patient has no known allergies.   History: Past Medical History:  Diagnosis Date   Cerebral aneurysm without rupture    CKD stage 3 due to type 2 diabetes mellitus 04/15/2019   DDD (degenerative disc disease), cervical 04/16/2019   Encephalomalacia on imaging study 08/25/2020   Brain MRI 08/25/21: Few scattered areas of encephalomalacia and gliosis within the overlying left frontal lobe most likely postoperative in nature.   Fatty liver 05/31/2020  Generalized anxiety disorder    GERD (gastroesophageal reflux disease)    Hyperlipidemia associated with type 2 diabetes mellitus 07/18/2018   Hypertension associated with diabetes 07/18/2018   Incontinence 09/22/2021   Bowel and bladder.    Legally blind    Major depressive disorder    Osteoporosis 01/18/2021   Paroxysmal atrial tachycardia (HCC)    On telemetry   Type 2 diabetes mellitus with diabetic polyneuropathy, with long-term current use of insulin  07/18/2018   has dexcom G7  - sensor left upper arm   Past Surgical History:  Procedure Laterality Date   ABDOMINAL HYSTERECTOMY  2012   APPENDECTOMY  1972   CEREBRAL ANEURYSM REPAIR Left 2004   Aneurysm clip in place near the level of the left ICA on Brain MRI 08/2020. Also coiling performed   CHOLECYSTECTOMY  2015   COLONOSCOPY  11/24/2020   in [place]   CRANIOTOMY Left    left pterional craniotomy   ETHMOIDECTOMY Left 11/23/2022   Procedure: LEFT ANTERIOR ETHMOIDECTOMY;  Surgeon: Llewellyn Gerard LABOR, DO;  Location: [facility] OR;  Service: ENT;  Laterality: Left;   EYE SURGERY  2023   MAXILLARY ANTROSTOMY Left 11/23/2022   Procedure: MAXILLARY  ANTROSTOMY;  Surgeon: Llewellyn Gerard LABOR, DO;  Location: [facility] OR;  Service: ENT;  Laterality: Left;   MENISCUS REPAIR Bilateral    NASAL TURBINATE REDUCTION Bilateral 11/23/2022   Procedure: Bilateral turbinate reduction;  Surgeon: Llewellyn Gerard LABOR, DO;  Location: [facility] OR;  Service: ENT;  Laterality: Bilateral;   SINUS ENDO W/FUSION Left 11/23/2022   Procedure: Functional endoscopic sinus surgery with navigation.;  Surgeon: Llewellyn Gerard LABOR, DO;  Location: [facility] OR;  Service: ENT;  Laterality: Left;   TUBAL LIGATION  1990   UPPER GI ENDOSCOPY  11/24/2020   in [facility]   Family History  Problem Relation Age of Onset   Alcohol abuse Mother    Diabetes Mother    Hypertension Mother    Kidney disease Mother    COPD Father    Diabetes Father    Early death Father    Heart disease Father    Hyperlipidemia Father    Hypertension Father    Diabetes Sister    Hypertension Sister    Breast cancer Sister    Diabetes Sister    Hypertension Sister    Kidney disease Sister    Cancer Maternal Grandmother    Alcohol abuse Maternal Grandfather    Cancer Maternal Grandfather    Cancer Paternal Grandmother    Cancer Paternal Grandfather    Social History   Socioeconomic History   Marital status: Divorced    Spouse name: John   Number of children: 3   Years of education: 14   Highest education level: Associate degree: academic program  Occupational History   Occupation: disabled  Tobacco Use   Smoking status: Former    Current packs/day: 0.00    Types: Cigarettes    Quit date: 06/1985    Years since quitting: 38.9    Passive exposure: Past   Smokeless tobacco: Never  Vaping Use   Vaping status: Never Used  Substance and Sexual Activity   Alcohol use: Never   Drug use: Not Currently    Types: Cocaine, Marijuana    Comment: 1976-1980   Sexual activity: Not Currently    Birth control/protection: Surgical    Comment: Hysterectomy  Other Topics Concern   Not on file  Social History  Narrative   Patient lives in [place].  Patient walks her dog daily for exercise.    Patient is separated from her husband.   Patient does not drive. Uses home delivery services and ubers.    Right handed   One story apartment   Caffeine on daily   Social Drivers of Health   Financial Resource Strain: Medium Risk (05/05/2024)   Overall Financial Resource Strain (CARDIA)    Difficulty of Paying Living Expenses: Somewhat hard  Food Insecurity: No Food Insecurity (05/05/2024)   Hunger Vital Sign    Worried About Running Out of Food in the Last Year: Never true    Ran Out of Food in the Last Year: Never true  Transportation Needs: Unmet Transportation Needs (05/05/2024)   PRAPARE - Administrator, Civil Service (Medical): Yes    Lack of Transportation (Non-Medical): Yes  Physical Activity: Inactive (05/05/2024)   Exercise Vital Sign    Days of Exercise per Week: 0 days    Minutes of Exercise per Session: Not on file  Stress: No Stress Concern Present (05/05/2024)   [employer] of Occupational Health - Occupational Stress Questionnaire    Feeling of Stress: Only a little  Recent Concern: Stress - Stress Concern Present (03/30/2024)   Received from [facility] of Occupational Health - Occupational Stress Questionnaire    Feeling of Stress : Very much  Social Connections: Socially Isolated (05/05/2024)   Social Connection and Isolation Panel    Frequency of Communication with Friends and Family: Never    Frequency of Social Gatherings with Friends and Family: Never    Attends Religious Services: Never    Database administrator or Organizations: No    Attends Engineer, structural: Not on file    Marital Status: Divorced    Tobacco Counseling Counseling given: Not Answered    Clinical Intake:  Pre-visit preparation completed: Yes  Pain : 0-10 Pain Score: 5  Pain Location: Generalized Pain Orientation: Left Pain Descriptors /  Indicators: Aching Pain Onset: More than a month ago Pain Frequency: Intermittent     BMI - recorded: 33.44 Nutritional Risks: None Diabetes: Yes CBG done?: No Did pt. bring in CBG monitor from home?: No  Lab Results  Component Value Date   HGBA1C 8.5 (A) 04/30/2024   HGBA1C 8.3 (A) 12/31/2023   HGBA1C 6.5 03/18/2023     How often do you need to have someone help you when you read instructions, pamphlets, or other written materials from your doctor or pharmacy?: 1 - Never  Interpreter Needed?: No  Information entered by :: Roz Fuller, LPN.   Activities of Daily Living     05/30/2024    1:14 PM  In your present state of health, do you have any difficulty performing the following activities:  Hearing? 1  Vision? 1  Difficulty concentrating or making decisions? 1  Walking or climbing stairs? 1  Dressing or bathing? 0  Doing errands, shopping? 1  Preparing Food and eating ? N  Using the Toilet? N  In the past six months, have you accidently leaked urine? Y  Do you have problems with loss of bowel control? Y  Managing your Medications? N  Managing your Finances? N  Housekeeping or managing your Housekeeping? N    Patient Care Team: Cleotilde Perkins, DO as PCP - General (Family Medicine) Pietro Redell RAMAN, MD as PCP - Cardiology (Cardiology) [facility], Donell Cardinal, MD as Consulting Physician (Endocrinology) Skeet Juliene SAUNDERS, DO as Consulting Physician (Neurology) Neda,  Jennet LABOR, MD as Consulting Physician (Pulmonary Disease)  I have updated your Care Teams any recent Medical Services you may have received from other providers in the past year.     Assessment:   This is a routine wellness examination for [place].  Hearing/Vision screen Hearing Screening - Comments:: Patient has adequate hearing. Vision Screening - Comments:: Wears rx glasses - up to date with routine eye exams with Dr. MARLA    Goals Addressed             This Visit's Progress     06/01/2024: To live to another year.         Depression Screen     06/01/2024    2:30 PM 05/28/2024    3:14 PM 04/30/2024    9:21 AM 03/10/2024    9:39 AM 12/31/2023   11:05 AM 10/15/2023    1:47 PM 06/17/2023    9:28 AM  PHQ 2/9 Scores  PHQ - 2 Score 4 4 5 6 5 2 5   PHQ- 9 Score 12 14 12 12 12 6 10     Fall Risk     05/30/2024    1:14 PM 04/30/2024    9:17 AM 03/10/2024    9:38 AM 01/29/2024   10:16 AM 12/31/2023   11:05 AM  Fall Risk   Falls in the past year? 1 1  1 1   Number falls in past yr: 1 1 0 1 0  Injury with Fall? 1 1 1 1 1   Comment  sprained wrist and ankle sprained wrist    Follow up    Falls evaluation completed     MEDICARE RISK AT HOME:  Medicare Risk at Home Any stairs in or around the home?: (Patient-Rptd) Yes If so, are there any without handrails?: (Patient-Rptd) Yes Home free of loose throw rugs in walkways, pet beds, electrical cords, etc?: (Patient-Rptd) Yes Adequate lighting in your home to reduce risk of falls?: (Patient-Rptd) Yes Life alert?: (Patient-Rptd) No Use of a cane, walker or w/c?: (Patient-Rptd) Yes Grab bars in the bathroom?: (Patient-Rptd) Yes Shower chair or bench in shower?: (Patient-Rptd) Yes Elevated toilet seat or a handicapped toilet?: (Patient-Rptd) No  TIMED UP AND GO:  Was the test performed?  No  Cognitive Function: Declined/Normal: No cognitive concerns noted by patient or family. Patient alert, oriented, able to answer questions appropriately and recall recent events. No signs of memory loss or confusion.    06/01/2024    2:31 PM  MMSE - Mini Mental State Exam  Not completed: Unable to complete      05/25/2021    4:16 PM  Montreal Cognitive Assessment   Visuospatial/ Executive (0/5) 0  Naming (0/3) 0  Attention: Read list of digits (0/2) 0  Attention: Read list of letters (0/1) 1  Attention: Serial 7 subtraction starting at 100 (0/3) 3  Language: Repeat phrase (0/2) 0  Language : Fluency (0/1) 1  Abstraction (0/2) 1   Delayed Recall (0/5) 2  Orientation (0/6) 6  Total 14  Adjusted Score (based on education) 14      05/27/2023    4:31 PM 03/19/2022   12:47 PM  6CIT Screen  What Year? 0 points 0 points  What month? 0 points 0 points  What time? 0 points 0 points  Count back from 20 0 points 0 points  Months in reverse 0 points 0 points  Repeat phrase 0 points 0 points  Total Score 0 points 0 points    Immunizations  Immunization History  Administered Date(s) Administered   Influenza, Quadrivalent, Recombinant, Inj, Pf 07/30/2019   Influenza,inj,Quad PF,6+ Mos 07/18/2018, 07/28/2021   Influenza-Unspecified 07/30/2019, 09/14/2020, 07/22/2023   PFIZER(Purple Top)SARS-COV-2 Vaccination 02/03/2020, 02/24/2020, 09/15/2020, 04/05/2021   Pfizer Covid-19 Vaccine Bivalent Booster 9yrs & up 07/09/2022   Pneumococcal-Unspecified 07/30/2023   Respiratory Syncytial Virus Vaccine,Recomb Aduvanted(Arexvy) 07/16/2022   Tdap 01/24/2016   Unspecified SARS-COV-2 Vaccination 07/22/2023   Zoster Recombinant(Shingrix) 01/23/2022, 04/24/2022    Screening Tests Health Maintenance  Topic Date Due   Pneumococcal Vaccine: 50+ Years (1 of 2 - PCV) 09/04/1975   INFLUENZA VACCINE  06/19/2024   HEMOGLOBIN A1C  10/30/2024   FOOT EXAM  12/30/2024   Diabetic kidney evaluation - Urine ACR  02/05/2025   Diabetic kidney evaluation - eGFR measurement  05/25/2025   Medicare Annual Wellness (AWV)  06/01/2025   DTaP/Tdap/Td (2 - Td or Tdap) 01/23/2026   MAMMOGRAM  04/16/2026   Colonoscopy  05/06/2027   DEXA SCAN  Completed   Hepatitis C Screening  Completed   Zoster Vaccines- Shingrix  Completed   Hepatitis B Vaccines  Aged Out   HPV VACCINES  Aged Out   Meningococcal B Vaccine  Aged Out   OPHTHALMOLOGY EXAM  Discontinued   COVID-19 Vaccine  Discontinued    Health Maintenance  Health Maintenance Due  Topic Date Due   Pneumococcal Vaccine: 50+ Years (1 of 2 - PCV) 09/04/1975   Health Maintenance Items Addressed:  Yes Patient aware of current care gaps.   Additional Screening:  Vision Screening: Recommended annual ophthalmology exams for early detection of glaucoma and other disorders of the eye. Would you like a referral to an eye doctor? No    Dental Screening: Recommended annual dental exams for proper oral hygiene  Community Resource Referral / Chronic Care Management: CRR required this visit?  No   CCM required this visit?  No   Plan:    I have personally reviewed and noted the following in the patient's chart:   Medical and social history Use of alcohol, tobacco or illicit drugs  Current medications and supplements including opioid prescriptions. Patient is not currently taking opioid prescriptions. Functional ability and status Nutritional status Physical activity Advanced directives List of other physicians Hospitalizations, surgeries, and ER visits in previous 12 months Vitals Screenings to include cognitive, depression, and falls Referrals and appointments  In addition, I have reviewed and discussed with patient certain preventive protocols, quality metrics, and best practice recommendations. A written personalized care plan for preventive services as well as general preventive health recommendations were provided to patient.   Roz LOISE Fuller, LPN   [id number]   After Visit Summary: (MyChart) Due to this being a telephonic visit, the after visit summary with patients personalized plan was offered to patient via MyChart   Notes: Patient is only due for Pneumonia vaccine according to MyChart.

## 2024-06-08 ENCOUNTER — Encounter (HOSPITAL_COMMUNITY): Payer: Self-pay

## 2024-06-08 ENCOUNTER — Ambulatory Visit (HOSPITAL_BASED_OUTPATIENT_CLINIC_OR_DEPARTMENT_OTHER)

## 2024-06-08 VITALS — BP 134/78 | HR 76 | Ht 61.2 in | Wt 179.0 lb

## 2024-06-08 DIAGNOSIS — Z794 Long term (current) use of insulin: Secondary | ICD-10-CM | POA: Diagnosis not present

## 2024-06-08 DIAGNOSIS — E1142 Type 2 diabetes mellitus with diabetic polyneuropathy: Secondary | ICD-10-CM

## 2024-06-08 DIAGNOSIS — F331 Major depressive disorder, recurrent, moderate: Secondary | ICD-10-CM | POA: Diagnosis not present

## 2024-06-08 DIAGNOSIS — F411 Generalized anxiety disorder: Secondary | ICD-10-CM | POA: Diagnosis not present

## 2024-06-08 DIAGNOSIS — M797 Fibromyalgia: Secondary | ICD-10-CM

## 2024-06-08 DIAGNOSIS — G894 Chronic pain syndrome: Secondary | ICD-10-CM

## 2024-06-08 MED ORDER — DULOXETINE HCL 60 MG PO CPEP: 60.0000 mg | ORAL_CAPSULE | Freq: Every day | ORAL | 3 refills | Status: AC

## 2024-06-08 MED ORDER — GABAPENTIN 300 MG PO CAPS: 300.0000 mg | ORAL_CAPSULE | Freq: Three times a day (TID) | ORAL | 0 refills | Status: DC

## 2024-06-12 NOTE — Addendum Note (Signed)
 Addended by: MERCY DOMINO A on: 06/12/2024 04:44 PM   Modules accepted: Level of Service

## 2024-06-13 ENCOUNTER — Other Ambulatory Visit: Payer: Self-pay | Admitting: Nurse Practitioner

## 2024-06-15 ENCOUNTER — Ambulatory Visit (INDEPENDENT_AMBULATORY_CARE_PROVIDER_SITE_OTHER): Admitting: Psychology

## 2024-06-15 DIAGNOSIS — F411 Generalized anxiety disorder: Secondary | ICD-10-CM

## 2024-06-15 DIAGNOSIS — F331 Major depressive disorder, recurrent, moderate: Secondary | ICD-10-CM | POA: Diagnosis not present

## 2024-06-15 DIAGNOSIS — F4001 Agoraphobia with panic disorder: Secondary | ICD-10-CM | POA: Diagnosis not present

## 2024-06-15 NOTE — Progress Notes (Signed)
 [facility] Counselor Initial Adult Exam  Name: Cassandra Reid Date: 06/15/2024 MRN: [id number] DOB: [birth year] PCP: Cleotilde Perkins, DO  Time spent: 11:01am-12:02pm  Pt is seen for a virtual video visit via caregility.  Pt joins from her home, reporting privacy, and counselor from her home office.  Pt consents to virtual visit and is aware of limitations of such visits.    Guardian/Payee:  self    Paperwork requested: No   Reason for Visit /Presenting Problem: Pt is referred by her PCP and by psychiatrist Dr. Kapoor for counseling.  Pt states I have been having some bad anxiety.  I have had depression on and off for a long time and anxiety too.  But now I'm not really leaving the house, I find it hard to do anything. If I can't easily escape (when out of the house) I won't leave.  I'm lonely, But I'm getting more and more ok w/ it and that is not good.     Pt has had significant life changes in the past 3 years.  She moved from her home in [place]  that she had lived for 3) years because her husband decided he wanted to move to [place] to be closer to his daughter and growing family.  Pt reports shortly after they moved here he informed her he wanted to leave and divorce after 35 years of marriage.  Pt reports following separation her in laws were supportive and insisted she was still part of the family and would be at family events.  However this only lasted a year until ex husband moved in w/ current girlfriend and she was no longer invited and hasn't talked to in law family in 2 years.  Pt reports that her son is angry at father so doesn't talk to him and as result her one daughter won't talk/be in same room as her son and so no longer has family get togethers at her home.  Pt reports the daughter who lives closest spends a lot of time w/ her father, father's side of family, and w/her in laws.  Pt reports her dog of 11 years and cat both died last year.  Pt reports  her dog was her companion.  Another reported stressors is her health, pain and struggles w/ decision making/focus potentially related to neurological deficits shown on most recent MRI.     Pt wants for counseling I want things to be better, that I won't be so anxious and hopeless sad.    Mental Status Exam: Appearance:   Well Groomed     Behavior:  Appropriate  Motor:  Normal  Speech/Language:   Clear and Coherent and Slow  Affect:  Congruent and Tearful  Mood:  anxious and depressed  Thought process:  normal  Thought content:    WNL  Sensory/Perceptual disturbances:    WNL  Orientation:  oriented to person, place, time/date, and situation  Attention:  Good  Concentration:  Good  Memory:  WNL  Fund of knowledge:   Good  Insight:    Good  Judgment:   Good  Impulse Control:  Good   Reported Symptoms:  Pt scored 17 on GAD 7 and 11 on PHQ9.  Pt reports daily depressed mood- feeling hopeless and alone.  Pt reports loss of interest and negative self worth.  Pt states in session I am too much to handle, otherwise why would people leave me.  Pt reports anxiety, worry about leaving house and have  pain, struggles w/ her medical issues and not being able to get home/away.  Pt reports that she is having panic attacks about 1 time a week.  Pt reports she is consumed w/ trip w/ son and to see daughter upcoming and feeling that she won't be able to go through with.  Pt reports last time was going to visit daughter and got to airport she panicked and left.  Pt reports her anxiety is keeping from being able to visit kids.     Risk Assessment: Danger to Self:  No Self-injurious Behavior: No Danger to Others: No Duty to Warn:no Physical Aggression / Violence:No  Access to Firearms a concern: No  Gang Involvement:No  Patient / guardian was educated about steps to take if suicide or homicide risk level increases between visits: yes While future psychiatric events cannot be accurately predicted, the  patient does not currently require acute inpatient psychiatric care and does not currently meet Max  involuntary commitment criteria.  Substance Abuse History: Current substance abuse: No     Past Psychiatric History:   Previous psychological history is significant for anxiety and depression Outpatient Providers:aftre brain surgery a lot of problems w/ depression.  Did go for counseling.  Wanted to put in husopital.  Id din't have to go.   History of Psych Hospitalization: No  Psychological Testing: none   Abuse History:  Victim of: No., none reported   Report needed: No. Victim of Neglect:No. Perpetrator of none  Witness / Exposure to Domestic Violence: No   Protective Services Involvement: No  Witness to [employer] Violence:  No   Family History:  Family History  Problem Relation Age of Onset   Alcohol abuse Mother    Diabetes Mother    Hypertension Mother    Kidney disease Mother    COPD Father    Diabetes Father    Early death Father    Heart disease Father    Hyperlipidemia Father    Hypertension Father    Diabetes Sister    Hypertension Sister    Breast cancer Sister    Diabetes Sister    Hypertension Sister    Kidney disease Sister    Cancer Maternal Grandmother    Alcohol abuse Maternal Grandfather    Cancer Maternal Grandfather    Cancer Paternal Grandmother    Cancer Paternal Grandfather    Pt grew up [place] w/ her mom, dad and 2 younger sisters- 6 and 2 years younger.  Her youngest sister is still living in [address] and reconnected after her mother passed away from kidney failure following care accident 20 years ago.  Her other sister died from COVID 5 years ago.  Her father died from a heart attack 45 years ago.  Pt reports her mom had disowned her 32 years ago and took out of will for forgetting her birthday and didn't have contact with family afterwards.     Living situation: the patient lives alone.  Pt has been in her current  apartment for 9 months.  Pt has qualified for housing assistance but is on a waitlist.  Her daughter and her son have offered for her to move in w/ them, but pt feels this would be a burden and she can't afford housing near them.  Pt reports he sister has also suggested she move closer to her but again she can't afford housing in that area.    Sexual Orientation: Straight  Relationship Status: divorced from husband of  35 years.  Separated 3 years ago after moved from [place]  to [facility] to move closer to her daughter.  Pt reports had no idea marriage was in jeopardy when moved.  Pt reports with in the year he was dating his current girlfriend who he lives with.  Pt reports enjoyed the apartment they moved into but wasn't able to afford following separation.  Pt has moved 3 times since.  No contact w/ ex for 2 years.   If a parent, number of children / ages: 3 kids. Son who is 38y/o and lives in [place] w/ his girlfriend and her 9y/o son.  Her 36y/o daughter lives in [place]  and her 35y/o daughter lives in [place] w/ daughter's husband, her 4y/o and 1y/o grandchildren.    Pt reports she talks and texts w/her 36y/o daughter daily and daughter visits 2x a year and she was trying to visit her 2 times a year- but hasn't any this year.  Pt reports her son and her talk/text at least weekly.  Pt reports that her daughter in [place] is busy w/ her family and her in laws and her exhusband and his family.  Pt reports she hasn't been to visit her in her current apartments and doesn't talk often and now usually sees for 1.5 hours every 3 months.  Support Systems: daughter, son, and sister.    Financial Stress:  Yes   Income/Employment/Disability: Product manager- pt disabled.  Military Service: No   Educational History: Education: some college- associates degree  Religion/Sprituality/World View: Believes in God  Any cultural differences that may affect / interfere with treatment:  not  applicable   Recreation/Hobbies: enjoys watching baseball  Stressors: Financial difficulties   Health problems   Loss of supports/family interaction/marriage/dog    Strengths: support of daughter and son  Barriers:  doesn't drive- license taken 20 years ago after brain surgery.   Legal History: Pending legal issue / charges: The patient has no significant history of legal issues. History of legal issue / charges: none   Medical History/Surgical History: reviewed Past Medical History:  Diagnosis Date   Cerebral aneurysm without rupture    CKD stage 3 due to type 2 diabetes mellitus 04/15/2019   DDD (degenerative disc disease), cervical 04/16/2019   Encephalomalacia on imaging study 08/25/2020   Brain MRI 08/25/21: Few scattered areas of encephalomalacia and gliosis within the overlying left frontal lobe most likely postoperative in nature.   Fatty liver 05/31/2020   Generalized anxiety disorder    GERD (gastroesophageal reflux disease)    Hyperlipidemia associated with type 2 diabetes mellitus 07/18/2018   Hypertension associated with diabetes 07/18/2018   Incontinence 09/22/2021   Bowel and bladder.    Legally blind    Major depressive disorder    Osteoporosis 01/18/2021   Paroxysmal atrial tachycardia (HCC)    On telemetry   Type 2 diabetes mellitus with diabetic polyneuropathy, with long-term current use of insulin  07/18/2018   has dexcom G7  - sensor left upper arm  Restrive lung disease.  Hard to walk, talk. I had brain surgery-mind not as clear- more mistakes Diabetes Legs hurt.   2 surgies for brain aunuresym.  Font part temporal lobe loss of volume.   Leg pain and elbow and aching.   Axispital nuerology Dengerative disc deisase.   Blood pressure controlled w medication.     Sister capecod.  Deficit front lob and decision making gone.    Past Surgical History:  Procedure Laterality Date  ABDOMINAL HYSTERECTOMY  2012   APPENDECTOMY  1972   CEREBRAL ANEURYSM  REPAIR Left 2004   Aneurysm clip in place near the level of the left ICA on Brain MRI 08/2020. Also coiling performed   CHOLECYSTECTOMY  2015   COLONOSCOPY  11/24/2020   in [facility]   CRANIOTOMY Left    left pterional craniotomy   ETHMOIDECTOMY Left 11/23/2022   Procedure: LEFT ANTERIOR ETHMOIDECTOMY;  Surgeon: Llewellyn Gerard LABOR, DO;  Location: [facility] OR;  Service: ENT;  Laterality: Left;   EYE SURGERY  2023   MAXILLARY ANTROSTOMY Left 11/23/2022   Procedure: MAXILLARY ANTROSTOMY;  Surgeon: Llewellyn Gerard LABOR, DO;  Location: [facility] OR;  Service: ENT;  Laterality: Left;   MENISCUS REPAIR Bilateral    NASAL TURBINATE REDUCTION Bilateral 11/23/2022   Procedure: Bilateral turbinate reduction;  Surgeon: Llewellyn Gerard LABOR, DO;  Location: [facility] OR;  Service: ENT;  Laterality: Bilateral;   SINUS ENDO W/FUSION Left 11/23/2022   Procedure: Functional endoscopic sinus surgery with navigation.;  Surgeon: Llewellyn Gerard LABOR, DO;  Location: [facility] OR;  Service: ENT;  Laterality: Left;   TUBAL LIGATION  1990   UPPER GI ENDOSCOPY  11/24/2020   in [facility]    Medications: Current Outpatient Medications  Medication Sig Dispense Refill   albuterol  (VENTOLIN  HFA) 108 (90 Base) MCG/ACT inhaler INHALE 2 PUFFS INTO THE LUNGS 2 (TWO) TIMES DAILY. PLUS 2 PUFFS AS NEEDED 18 each 1   amLODipine  (NORVASC ) 5 MG tablet TAKE 1 TABLET EVERY DAY 90 tablet 0   Ascorbic Acid (VITAMIN C ADULT GUMMIES PO) Take 1 each by mouth daily in the afternoon.     atorvastatin  (LIPITOR) 40 MG tablet Take 40 mg by mouth daily.     BD PEN NEEDLE NANO 2ND GEN 32G X 4 MM MISC USE WITH INSULIN  100 each 4   blood glucose meter kit and supplies Dispense based on patient and insurance preference. Use up to four times daily as directed. (FOR ICD-10 E10.9, E11.9). 1 each 0   budeson-glycopyrrolate-formoterol  (BREZTRI  AEROSPHERE) 160-9-4.8 MCG/ACT AERO inhaler Inhale 2 puffs into the lungs in the morning and at bedtime. 3 each 3   busPIRone  (BUSPAR ) 10 MG tablet  Take 1 tablet (10 mg total) by mouth 3 (three) times daily. 90 tablet 2   Continuous Blood Gluc Sensor (DEXCOM G6 SENSOR) MISC Use as directed to check blood sugar. 4 each 11   Continuous Blood Gluc Transmit (DEXCOM G6 TRANSMITTER) MISC Use as directed to check blood sugar. 1 each 12   dapagliflozin  propanediol (FARXIGA ) 10 MG TABS tablet Take 1 tablet (10 mg total) by mouth daily. 90 tablet 3   DULoxetine  (CYMBALTA ) 60 MG capsule Take 1 capsule (60 mg total) by mouth daily. 90 capsule 3   ezetimibe  (ZETIA ) 10 MG tablet TAKE 1 TABLET EVERY DAY 90 tablet 3   famotidine  (PEPCID ) 20 MG tablet Take 1 tablet (20 mg total) by mouth daily. 90 tablet 3   FIBER SELECT GUMMIES PO Take 1 tablet by mouth daily in the afternoon.     fluticasone  (FLONASE ) 50 MCG/ACT nasal spray USE 1 SPRAY INTO BOTH NOSTRILS DAILY AS NEEDED FOR ALLERGIES OR RHINITIS. 16 g 5   furosemide  (LASIX ) 40 MG tablet Take 1 tablet (40 mg total) by mouth as needed for edema.     gabapentin  (NEURONTIN ) 300 MG capsule Take 1 capsule (300 mg total) by mouth 3 (three) times daily. 90 capsule 0   insulin  aspart (FIASP ) 100 UNIT/ML FlexTouch Pen Inject 18-20  Units into the skin daily with breakfast AND 16-18 Units daily with lunch AND 14-16 Units daily with supper. This is a patient assistance medication..     Insulin  Pen Needle (NOVOFINE PLUS PEN NEEDLE) 32G X 4 MM MISC Inject 1 Device into the skin as directed. 100 each 11   lisinopril  (ZESTRIL ) 40 MG tablet TAKE 1 TABLET EVERY DAY 90 tablet 3   loratadine  (CLARITIN ) 10 MG tablet TAKE 1 TABLET EVERY DAY 90 tablet 3   LORazepam  (ATIVAN ) 0.5 MG tablet TAKE 1 TABLET EVERY 8 HOURS AS NEEDED FOR ANXIETY 90 tablet 0   metoprolol  succinate (TOPROL -XL) 50 MG 24 hr tablet TAKE 1 TABLET (50 MG TOTAL) BY MOUTH DAILY. TAKE WITH OR IMMEDIATELY FOLLOWING A MEAL. 90 tablet 3   Multiple Vitamin (MULTIVITAMIN WITH MINERALS) TABS tablet Take 1 tablet by mouth in the morning.     OneTouch Delica Lancets 33G MISC  Please use to check blood sugar up to 4 times daily. E11.42 100 each 12   Oyster Shell (OYSTER CALCIUM ) 500 MG TABS tablet Take 500 mg of elemental calcium  by mouth daily in the afternoon.     pantoprazole  (PROTONIX ) 40 MG tablet TAKE 1 TABLET EVERY DAY 90 tablet 3   potassium chloride  SA (KLOR-CON  M) 20 MEQ tablet TAKE 1 TABLET AS NEEDED (TAKE AS DIRECTED WITH LASIX  AS NEEDED FOR EDEMA) 90 tablet 1   Semaglutide , 2 MG/DOSE, (OZEMPIC , 2 MG/DOSE,) 8 MG/3ML SOPN Inject 2 mg into the skin once a week. 3 mL 11   TRESIBA  FLEXTOUCH 100 UNIT/ML FlexTouch Pen Inject 32 Units into the skin 2 (two) times daily.     TRUE METRIX BLOOD GLUCOSE TEST test strip TEST BLOOD SUGAR AS INSTRUCTED 350 strip 3   Current Facility-Administered Medications  Medication Dose Route Frequency Provider Last Rate Last Admin   0.9 %  sodium chloride  infusion  500 mL Intravenous Once Danis, Henry L III, MD      Pt reports she is no longer taking Ativan  or Buspar  per psychiatrist recommendations.  No Known Allergies  Diagnoses:  Moderate episode of recurrent major depressive disorder (HCC)  Generalized anxiety disorder  Panic disorder with agoraphobia  Plan of Care: Pt is a 67y/o divorced female seeking counseling as referred by her PCP and Psychiatrist.  Pt reports hx of depression and anxiety on and off over the years.  Pt sought counseling for depression that worsened after brain surgery for aneurysm.  Pt reports her anxiety has significantly exacerbated in past couple of years and getting to the point that doesn't want to leave house to visit kids.  Pt reports panic attacks usually weekly and now ativan  has been d/c due to health concerns.  Pt is worrying w/ upcoming trip in September to visit w/ son and daughter as last trip canceled when had panic attack at airport.  Pt to f/u w/ weekly counseling and to f/u as scheduled w/ PCP and psychiatrist.  Pt and counselor to develop counseling plan at next session.           BARBARANN APPL, LCMHC

## 2024-06-15 NOTE — Progress Notes (Signed)
   Cassandra Reid, [facility]

## 2024-06-23 ENCOUNTER — Ambulatory Visit (INDEPENDENT_AMBULATORY_CARE_PROVIDER_SITE_OTHER): Admitting: Psychology

## 2024-06-23 ENCOUNTER — Encounter: Payer: Self-pay | Admitting: Psychology

## 2024-06-23 DIAGNOSIS — F411 Generalized anxiety disorder: Secondary | ICD-10-CM

## 2024-06-23 DIAGNOSIS — F4001 Agoraphobia with panic disorder: Secondary | ICD-10-CM | POA: Diagnosis not present

## 2024-06-23 DIAGNOSIS — F331 Major depressive disorder, recurrent, moderate: Secondary | ICD-10-CM

## 2024-06-23 NOTE — Progress Notes (Signed)
 [facility] Counselor/Therapist Progress Note  Patient ID: EVVA DIN, MRN: [id number],    Date: 06/23/2024  Time Spent: 9:00am-9:58am  Pt is seen for a virtual video visit via caregility.  Pt joins from her home reporting, privacy, and counselor from her home office.  Pt consents to virtual visis and is aware of limitation of such visits.    Treatment Type: Individual Therapy  Reported Symptoms: anxiety, depressed mood, hopeless, loss of interest, panic attacks.  Mental Status Exam: Appearance:  Well Groomed     Behavior: Appropriate  Motor: Normal  Speech/Language:  Clear and Coherent and Normal Rate  Affect: Appropriate  Mood: anxious and depressed  Thought process: normal  Thought content:   WNL  Sensory/Perceptual disturbances:   WNL  Orientation: oriented to person, place, time/date, and situation  Attention: Good  Concentration: Good  Memory: WNL  Fund of knowledge:  Good  Insight:   Good  Judgment:  Good  Impulse Control: Good   Risk Assessment: Danger to Self:  No Self-injurious Behavior: No Danger to Others: No Duty to Warn:no Physical Aggression / Violence:No  Access to Firearms a concern: No  Gang Involvement:No   Subjective: Counselor assessed pt current functioning per pt report.  Developed tx plan w/ pt input and consent.  Introduced to therapeutic process.   Processed w/pt anxiety, stressor and mood.  Provided psychoeducation re: recovery from depression and anxiety.  Discussed self care routines and ways to engage w/ interests.  Began identifying skills currently using for grounding and introduced to other practices.  Pt affect wnl.  Pt reports some pain in back this morning.  Pt reports continued anxiety and panic attacks when thinking about upcoming trip w/ kids. Pt reports struggling w/ depressed and hopeless moods and thinking worst case scenarios. Pt reports she is being mindful of getting up, out of bed daily, getting dressed and leaving  her room. Pt reports takes a lot of effort to do.  Pt recognizes other positives of eating daily and taking meds as prescribed.  Pt reports feeling very self conscious w/ her hair loss and recent hair cut.  Pt able to reframe and acknowledge others not judging.  Pt discussed interests w/ gardening, swimming, baseball/football, and baking.  Pt reports some barriers w/ lack of transportation, outdoor space and lack of social circle local.  Pt reflected on ways of engaging w/ interests.  Pt reports using counting to ground and reflects that progressive muscle relaxation has benefited in past.  Pt receptive to 5 senses grounding practice, ground through awareness of feet on ground and movement and release muscle tension.    Interventions: Cognitive Behavioral Therapy, Mindfulness Meditation, Psycho-education/Bibliotherapy, and tx planning  Diagnosis:Moderate episode of recurrent major depressive disorder (HCC)  Generalized anxiety disorder  Panic disorder with agoraphobia  Plan: Pt to f/u w/ weekly counseling.  Pt to f/u /w Dr. Kapoor as scheduled.  Pt to f/u w/ PCP and specialist as scheduled.  Plan for relaxation/grounding practice next session.  Individualized Treatment Plan Strengths: enjoys gardening, baseball enjoys- [employer].  Enjoys football season- feels nostalgic.  Watching tv history and discovery channel.  I used to bake.  Enjoy swimming. Crossword puzzle daily.  Used to love to read.    Supports: her daughter in [place]  and son in [place].     Goal/Needs for Treatment:  In order of importance to patient 1) reduce anxiety 2) reduce hopeless depression. 3) ---   Client Statement of Needs: I just want to feel  hopeful again.  I want to feel like things could get better even if they don't right away. I think if I felt not as anxious or sad there would be more of a reason to get out of bed.   Treatment Level: outpt counseling  Symptoms:anxiety, panic attacks, worry, rumination, depression,  lonely, hopeless  Client Treatment Preferences:weekly counseling initially.  Continue medication management w/ Dr. Vickie   Healthcare consumer's goal for treatment:  Counselor, Cassandra Reid, [facility] will support the patient's ability to achieve the goals identified. Cognitive Behavioral Therapy, Assertive Communication/Conflict Resolution Training, Relaxation Training, ACT, Humanistic and other evidenced-based practices will be used to promote progress towards healthy functioning.   Healthcare consumer will: Actively participate in therapy, working towards healthy functioning.    *Justification for Continuation/Discontinuation of Goal: R=Revised, O=Ongoing, A=Achieved, D=Discontinued  Goal 1) Increased coping skills to manage anxiety, reduce panic attacks and rumination AEB pt report of daily use of relaxation, mindfulness and reframing skills.   Baseline date 06/23/24: Progress towards goal 0; How Often - Daily Target Date Goal Was reviewed Status Code Progress towards goal/Likert rating  06/23/25                Goal 2) Increased awareness of connection of depressed mood, thoughts and behaviors AEB pt report of engaging in self care, interest daily, self compassion self talk, and reframing depressive thinking. Baseline date 06/23/24: Progress towards goal 0; How Often - Daily Target Date Goal Was reviewed Status Code Progress towards goal  06/23/25                This plan has been reviewed and created by the following participants:  This plan will be reviewed at least every 12 months. Date Behavioral Health Clinician Date Guardian/Patient   06/23/24  [facility] Cassandra Reid 06/23/24 Verbal Consent Provided, electronic signature requested                    Cassandra Reid [facility]

## 2024-06-23 NOTE — Progress Notes (Signed)
   Forde Radon, [facility]

## 2024-06-26 ENCOUNTER — Other Ambulatory Visit: Payer: Self-pay | Admitting: Cardiology

## 2024-06-26 DIAGNOSIS — E785 Hyperlipidemia, unspecified: Secondary | ICD-10-CM

## 2024-06-29 ENCOUNTER — Encounter: Payer: Self-pay | Admitting: Student

## 2024-06-30 NOTE — Telephone Encounter (Signed)
 Rec'd 5 boxes of pens. A little over a 3.5 month supply.

## 2024-07-01 NOTE — Progress Notes (Deleted)
 Psychiatric Follow Up  Patient Identification: Cassandra Reid MRN:  [id number] Date of Evaluation:  07/01/2024 Referral Source: Family medicine (Dr. Vertie)  Assessment:  Cassandra Reid is a 68 y.o. female with a history of anxiety, MDD who presents in person to [facility] for initial mental health evaluation.  Patient has a diagnosis of depression and anxiety and has been having worsening depression and anxiety in the setting of psychosocial stressors severe's that includes living by herself, not having a good social support and social life, her medical history that includes brain surgery, having neuropathy.  All these factors have been contributing to her depressed mood, and worsening anxiety and decreased or disturbed sleep.  She has been eating well, has been independent for her ADLs.  She was alert and oriented, has not been using alcohol, cigarettes or any substances.  Some supportive therapy was done during the current visit and she reported an improvement in her mood.  She has been making changes to her lifestyle like looking for better housing, traveling by herself and reengaging herself in cooking which she enjoys.  Though her pain is the primary factor that she believes worsens her anxiety, discussed about increasing gabapentin  to 300 mg 3 times daily.  She reports an improvement in her mood, pain and anxiety on duloxetine , gabapentin  and Ativan  as needed which is provided by her primary care provider.  Discussed therapy with the patient, patient amenable to starting that in the clinic in person.  Resources for therapy were provided.  Patient encouraged to seek therapy with the current author, patient amenable.  The prescription for gabapentin  300 mg 3 times daily was sent to the preferred pharmacy, encouraged to follow up in the clinic in 4 weeks, patient encouraged to call the clinic if she needed an earlier appointment.   Risk Assessment: An assessment of suicide and  violence risk factors was performed as part of this evaluation and is not  significantly changed from the last visit. While future psychiatric events cannot be accurately predicted, the patient does not currently require acute inpatient psychiatric care and does not currently meet [place]  involuntary commitment criteria. Patient was given contact information for crisis resources, behavioral health clinic and was instructed to call 911 for emergencies.   Plan:  # Generalized anxiety disorder Past medication trials: Buspirone , Ativan  Status of problem: Chronic Interventions: -- Continue Cymbalta  60 mg daily -- Continue Ativan  0.5 mg as needed, patient's primary care provider prescribes it -- Increase gabapentin  to 300 mg 3 times daily -- Encouraged therapy, resources provided, patient informed about having therapy scheduled with the author.  # MDD Past medication trials: Prozac , duloxetine , Lexapro  Status of problem: Chronic Interventions: -- Continue Cymbalta  60 mg daily -- Encouraged therapy, resources provided, patient informed about having therapy scheduled with the author.  # Neuropathy Past medication trials:  Status of problem: Chronic Interventions: -- Continue Cymbalta  60 mg daily -- Increase gabapentin  to 300 mg 3 times daily -- Continue pain management per neurologist  Return to care in 4 weeks  Patient was given contact information for behavioral health clinic and was instructed to call 911 for emergencies.    Patient and plan of care will be discussed with the Attending MD ,Dr. Mercy, who agrees with the above statement and plan.   Subjective:  Chief Complaint:  No chief complaint on file.   History of Present Illness:    Cassandra Reid is a 68 year old female with a psychiatric history of depression, anxiety  that presented to the clinic for psychiatric management for worsening of anxiety and depression.  The patient reported that her mood was anxious ,  when asked to elaborate she stated when I am out of house, I do not know what to say .  She reported that she has been having worsening anxiety for which her primary care provider referred her to psychiatry.  When asked about what causes her anxiety she stated everything.  Reported feeling sad , when asked to elaborate she stated I lost everything, my husband wanted to move here but then he left me as soon as he moved .  She reported that she has had brain surgery and cannot drive and has been having transportation issues.  She lives in a 2 room apartment and reports no social life.  Reported that she has a daughter here but she cannot see her by herself as she always have people around her.  Reported that she has been feeling lonely and is not dependent on anybody, travels by herself, has not asked anybody for a ride.  She reported sleep difficulties due to pain, depression and anxiety, diminished interest in daily activities, decreased energy to do daily tasks, reports that she sits on the couch all day, diminished concentration but reports decent appetite.  Reported that she gets up a lot in the night and is unable to stay asleep, reports daily sleep of about 4 hours.  She denies SI/HI/AVH.  She reported that nothing makes me happy .  She also endorsed anxiety stating it get worse, I start crying and praying .  Reported that Cymbalta , gabapentin  and Ativan  as needed which she has taken once in 3 weeks has been helpful for anxiety.  Reported that she was on buspirone  in the past but I did not like how that made me feel, it made me feel wrong.  So she discontinued it.   She denied drinking alcohol, smoking cigarettes or using any substances reported that she used them 40 years ago.  Reported compliance on Cymbalta  60 mg daily, gabapentin  300 mg 2 times daily, Ativan  0.5 mg as needed, denies any side effects but reports that it makes me feel better .  Discussed different options about  medication management, patient amenable to increasing gabapentin  to 300 mg 3 times daily and starting therapy in the clinic.  Resources for therapy provided, patient encouraged that she can be scheduled for therapy with the author.  Prescription was sent to the preferred pharmacy for 1 month, encouraged to follow up in 4 week.   Past Psychiatric History:  Diagnoses: Anxiety, MDD Medication trials: Buspirone , Ativan , Prozac  Previous psychiatrist/therapist: None  Hospitalizations: None Suicide attempts: Denies SIB: N/A Hx of violence towards others: Denies Current access to guns: Denies Hx of trauma/abuse: Denies  Substance Abuse History in the last 12 months:  No.  Past Medical History:  Past Medical History:  Diagnosis Date   Cerebral aneurysm without rupture    CKD stage 3 due to type 2 diabetes mellitus 04/15/2019   DDD (degenerative disc disease), cervical 04/16/2019   Encephalomalacia on imaging study 08/25/2020   Brain MRI 08/25/21: Few scattered areas of encephalomalacia and gliosis within the overlying left frontal lobe most likely postoperative in nature.   Fatty liver 05/31/2020   Generalized anxiety disorder    GERD (gastroesophageal reflux disease)    Hyperlipidemia associated with type 2 diabetes mellitus 07/18/2018   Hypertension associated with diabetes 07/18/2018   Incontinence 09/22/2021   Bowel and bladder.  Legally blind    Major depressive disorder    Osteoporosis 01/18/2021   Paroxysmal atrial tachycardia (HCC)    On telemetry   Type 2 diabetes mellitus with diabetic polyneuropathy, with long-term current use of insulin  07/18/2018   has dexcom G7  - sensor left upper arm    Past Surgical History:  Procedure Laterality Date   ABDOMINAL HYSTERECTOMY  2012   APPENDECTOMY  1972   CEREBRAL ANEURYSM REPAIR Left 2004   Aneurysm clip in place near the level of the left ICA on Brain MRI 08/2020. Also coiling performed   CHOLECYSTECTOMY  2015   COLONOSCOPY   11/24/2020   in [place]   CRANIOTOMY Left    left pterional craniotomy   ETHMOIDECTOMY Left 11/23/2022   Procedure: LEFT ANTERIOR ETHMOIDECTOMY;  Surgeon: Llewellyn Gerard LABOR, DO;  Location: [facility] OR;  Service: ENT;  Laterality: Left;   EYE SURGERY  2023   MAXILLARY ANTROSTOMY Left 11/23/2022   Procedure: MAXILLARY ANTROSTOMY;  Surgeon: Llewellyn Gerard LABOR, DO;  Location: [facility] OR;  Service: ENT;  Laterality: Left;   MENISCUS REPAIR Bilateral    NASAL TURBINATE REDUCTION Bilateral 11/23/2022   Procedure: Bilateral turbinate reduction;  Surgeon: Llewellyn Gerard LABOR, DO;  Location: [facility] OR;  Service: ENT;  Laterality: Bilateral;   SINUS ENDO W/FUSION Left 11/23/2022   Procedure: Functional endoscopic sinus surgery with navigation.;  Surgeon: Llewellyn Gerard LABOR, DO;  Location: [facility] OR;  Service: ENT;  Laterality: Left;   TUBAL LIGATION  1990   UPPER GI ENDOSCOPY  11/24/2020   in [place]    Family Psychiatric History: Unable to elaborate  Family History:  Family History  Problem Relation Age of Onset   Alcohol abuse Mother    Diabetes Mother    Hypertension Mother    Kidney disease Mother    COPD Father    Diabetes Father    Early death Father    Heart disease Father    Hyperlipidemia Father    Hypertension Father    Diabetes Sister    Hypertension Sister    Breast cancer Sister    Diabetes Sister    Hypertension Sister    Kidney disease Sister    Cancer Maternal Grandmother    Alcohol abuse Maternal Grandfather    Cancer Maternal Grandfather    Cancer Paternal Grandmother    Cancer Paternal Grandfather     Social History:   Academic/Vocational: N/A Social History   Socioeconomic History   Marital status: Divorced    Spouse name: John   Number of children: 3   Years of education: 14   Highest education level: Associate degree: academic program  Occupational History   Occupation: disabled  Tobacco Use   Smoking status: Former    Current packs/day: 0.00    Types: Cigarettes     Quit date: 06/1985    Years since quitting: 39.0    Passive exposure: Past   Smokeless tobacco: Never  Vaping Use   Vaping status: Never Used  Substance and Sexual Activity   Alcohol use: Never   Drug use: Not Currently    Types: Cocaine, Marijuana    Comment: 1976-1980   Sexual activity: Not Currently    Birth control/protection: Surgical    Comment: Hysterectomy  Other Topics Concern   Not on file  Social History Narrative   Patient lives in [place].    Patient walks her dog daily for exercise.    Patient is separated from her husband.   Patient does not  drive. Uses home delivery services and ubers.    Right handed   One story apartment   Caffeine on daily   Social Drivers of Health   Financial Resource Strain: Medium Risk (05/05/2024)   Overall Financial Resource Strain (CARDIA)    Difficulty of Paying Living Expenses: Somewhat hard  Food Insecurity: No Food Insecurity (05/05/2024)   Hunger Vital Sign    Worried About Running Out of Food in the Last Year: Never true    Ran Out of Food in the Last Year: Never true  Transportation Needs: Unmet Transportation Needs (05/05/2024)   PRAPARE - Administrator, Civil Service (Medical): Yes    Lack of Transportation (Non-Medical): Yes  Physical Activity: Inactive (05/05/2024)   Exercise Vital Sign    Days of Exercise per Week: 0 days    Minutes of Exercise per Session: Not on file  Stress: No Stress Concern Present (05/05/2024)   [employer] of Occupational Health - Occupational Stress Questionnaire    Feeling of Stress: Only a little  Recent Concern: Stress - Stress Concern Present (03/30/2024)   Received from [facility] of Occupational Health - Occupational Stress Questionnaire    Feeling of Stress : Very much  Social Connections: Socially Isolated (05/05/2024)   Social Connection and Isolation Panel    Frequency of Communication with Friends and Family: Never    Frequency of Social  Gatherings with Friends and Family: Never    Attends Religious Services: Never    Database administrator or Organizations: No    Attends Engineer, structural: Not on file    Marital Status: Divorced    Additional Social History: updated  Allergies:  No Known Allergies  Current Medications: Current Outpatient Medications  Medication Sig Dispense Refill   albuterol  (VENTOLIN  HFA) 108 (90 Base) MCG/ACT inhaler INHALE 2 PUFFS INTO THE LUNGS 2 (TWO) TIMES DAILY. PLUS 2 PUFFS AS NEEDED 18 each 1   amLODipine  (NORVASC ) 5 MG tablet TAKE 1 TABLET EVERY DAY 90 tablet 0   Ascorbic Acid (VITAMIN C ADULT GUMMIES PO) Take 1 each by mouth daily in the afternoon.     atorvastatin  (LIPITOR) 40 MG tablet Take 40 mg by mouth daily.     BD PEN NEEDLE NANO 2ND GEN 32G X 4 MM MISC USE WITH INSULIN  100 each 4   blood glucose meter kit and supplies Dispense based on patient and insurance preference. Use up to four times daily as directed. (FOR ICD-10 E10.9, E11.9). 1 each 0   budeson-glycopyrrolate-formoterol  (BREZTRI  AEROSPHERE) 160-9-4.8 MCG/ACT AERO inhaler Inhale 2 puffs into the lungs in the morning and at bedtime. 3 each 3   busPIRone  (BUSPAR ) 10 MG tablet Take 1 tablet (10 mg total) by mouth 3 (three) times daily. (Patient not taking: Reported on 06/15/2024) 90 tablet 2   Continuous Blood Gluc Sensor (DEXCOM G6 SENSOR) MISC Use as directed to check blood sugar. 4 each 11   Continuous Blood Gluc Transmit (DEXCOM G6 TRANSMITTER) MISC Use as directed to check blood sugar. 1 each 12   dapagliflozin  propanediol (FARXIGA ) 10 MG TABS tablet Take 1 tablet (10 mg total) by mouth daily. 90 tablet 3   DULoxetine  (CYMBALTA ) 60 MG capsule Take 1 capsule (60 mg total) by mouth daily. 90 capsule 3   ezetimibe  (ZETIA ) 10 MG tablet TAKE 1 TABLET EVERY DAY 90 tablet 3   famotidine  (PEPCID ) 20 MG tablet Take 1 tablet (20 mg total) by mouth daily. 90  tablet 3   FIBER SELECT GUMMIES PO Take 1 tablet by mouth daily in  the afternoon.     fluticasone  (FLONASE ) 50 MCG/ACT nasal spray USE 1 SPRAY INTO BOTH NOSTRILS DAILY AS NEEDED FOR ALLERGIES OR RHINITIS. 16 g 5   furosemide  (LASIX ) 40 MG tablet Take 1 tablet (40 mg total) by mouth as needed for edema.     gabapentin  (NEURONTIN ) 300 MG capsule Take 1 capsule (300 mg total) by mouth 3 (three) times daily. 90 capsule 0   insulin  aspart (FIASP ) 100 UNIT/ML FlexTouch Pen Inject 18-20 Units into the skin daily with breakfast AND 16-18 Units daily with lunch AND 14-16 Units daily with supper. This is a patient assistance medication..     Insulin  Pen Needle (NOVOFINE PLUS PEN NEEDLE) 32G X 4 MM MISC Inject 1 Device into the skin as directed. 100 each 11   lisinopril  (ZESTRIL ) 40 MG tablet TAKE 1 TABLET EVERY DAY 90 tablet 3   loratadine  (CLARITIN ) 10 MG tablet TAKE 1 TABLET EVERY DAY 90 tablet 3   LORazepam  (ATIVAN ) 0.5 MG tablet TAKE 1 TABLET EVERY 8 HOURS AS NEEDED FOR ANXIETY 90 tablet 0   metoprolol  succinate (TOPROL -XL) 50 MG 24 hr tablet TAKE 1 TABLET (50 MG TOTAL) BY MOUTH DAILY. TAKE WITH OR IMMEDIATELY FOLLOWING A MEAL. 90 tablet 3   Multiple Vitamin (MULTIVITAMIN WITH MINERALS) TABS tablet Take 1 tablet by mouth in the morning.     OneTouch Delica Lancets 33G MISC Please use to check blood sugar up to 4 times daily. E11.42 100 each 12   Oyster Shell (OYSTER CALCIUM ) 500 MG TABS tablet Take 500 mg of elemental calcium  by mouth daily in the afternoon.     pantoprazole  (PROTONIX ) 40 MG tablet TAKE 1 TABLET EVERY DAY 90 tablet 3   potassium chloride  SA (KLOR-CON  M) 20 MEQ tablet TAKE 1 TABLET AS NEEDED (TAKE AS DIRECTED WITH LASIX  AS NEEDED FOR EDEMA) 90 tablet 1   Semaglutide , 2 MG/DOSE, (OZEMPIC , 2 MG/DOSE,) 8 MG/3ML SOPN Inject 2 mg into the skin once a week. 3 mL 11   TRESIBA  FLEXTOUCH 100 UNIT/ML FlexTouch Pen Inject 32 Units into the skin 2 (two) times daily.     TRUE METRIX BLOOD GLUCOSE TEST test strip TEST BLOOD SUGAR AS INSTRUCTED 350 strip 3   Current  Facility-Administered Medications  Medication Dose Route Frequency Provider Last Rate Last Admin   0.9 %  sodium chloride  infusion  500 mL Intravenous Once Danis, Henry L III, MD        ROS: Review of Systems  Constitutional:  Negative for activity change, appetite change, chills, diaphoresis and fatigue.  HENT:  Negative for congestion, dental problem, drooling, ear discharge and ear pain.   Eyes:  Negative for pain, discharge and itching.  Respiratory:  Negative for apnea, cough, choking and chest tightness.   Cardiovascular:  Negative for chest pain, palpitations and leg swelling.  Gastrointestinal:  Negative for abdominal distention, abdominal pain, constipation, diarrhea and nausea.  Endocrine: Negative for cold intolerance and heat intolerance.  Genitourinary:  Negative for difficulty urinating, dysuria, flank pain, frequency, hematuria and urgency.  Musculoskeletal:  Negative for arthralgias, back pain, gait problem, joint swelling, myalgias and neck pain.  Skin:  Negative for color change and pallor.  Allergic/Immunologic: Negative for environmental allergies and food allergies.  Neurological:  Negative for dizziness, seizures, syncope, facial asymmetry, speech difficulty, light-headedness, numbness and headaches.  Psychiatric/Behavioral:  Positive for dysphoric mood and sleep disturbance. Negative for agitation,  behavioral problems, confusion, decreased concentration, hallucinations, self-injury and suicidal ideas. The patient is nervous/anxious. The patient is not hyperactive.     Objective:  Psychiatric Specialty Exam: There were no vitals taken for this visit.There is no height or weight on file to calculate BMI.  General Appearance: Casual  Eye Contact:  Good  Speech:  Clear and Coherent and Normal Rate  Volume:  Normal  Mood:  Depressed  Affect:  Depressed  Thought Content: Logical   Suicidal Thoughts:  No  Homicidal Thoughts:  No  Thought Process:  Coherent   Orientation:  Full (Time, Place, and Person)    Memory: Immediate;   Good Recent;   Fair Remote;   Fair  Judgment:  Fair  Insight:  Fair  Concentration:  Concentration: Good and Attention Span: Good  Recall:  not formally assessed   Fund of Knowledge: Good  Language: Good  Psychomotor Activity:  Normal  Akathisia:  No  AIMS (if indicated): not done  Assets:  Communication Skills Desire for Improvement Financial Resources/Insurance Housing Resilience Vocational/Educational  ADL's:  Intact  Cognition: WNL  Sleep:  Fair   PE: General: well-appearing; no acute distress  Pulm: no increased work of breathing on room air  Strength & Muscle Tone: within normal limits Neuro: no focal neurological deficits observed  Gait & Station: normal  Metabolic Disorder Labs: Lab Results  Component Value Date   HGBA1C 8.5 (A) 04/30/2024   MPG 157 11/23/2022   MPG 249 10/29/2019   No results found for: PROLACTIN Lab Results  Component Value Date   CHOL 91 (L) 10/11/2023   TRIG 96 10/11/2023   HDL 44 10/11/2023   CHOLHDL 2.1 10/11/2023   VLDL 61.7 07/18/2018   LDLCALC 28 10/11/2023   LDLCALC 92 06/26/2022   Lab Results  Component Value Date   TSH 1.500 06/26/2022    Therapeutic Level Labs: No results found for: LITHIUM No results found for: CBMZ No results found for: VALPROATE  Screenings:  GAD-7    Flowsheet Row Office Visit from 04/30/2024 in [facility] - A Dept Of [place]. [facility] Office Visit from 06/05/2021 in [facility] - A Dept Of [place]. [facility] Video Visit from 09/08/2020 in [facility] Office Visit from 05/26/2020 in [facility] Primary Care & Sports Medicine at [facility] Video Visit from 02/04/2020 in [facility] Primary Care & Sports Medicine at [facility]  Total GAD-7 Score 15 17 18 15 16    PHQ2-9    Flowsheet Row  Office Visit from 06/08/2024 in [facility] Clinical Support from 06/01/2024 in [facility] - A Dept Of [place]. [facility] Office Visit from 05/28/2024 in [facility] - A Dept Of [place]. [facility] Office Visit from 04/30/2024 in [facility] - A Dept Of [place] [facility]. [facility] Office Visit from 03/10/2024 in [facility] - A Dept Of [place] [facility]. [facility]  PHQ-2 Total Score 4 4 4 5 6   PHQ-9 Total Score 12 12 14 12 12    Flowsheet Row ED from 02/12/2023 in [facility] at [facility] Admission (Discharged) from 11/23/2022 in [place] [facility] Medical Unit ED from 04/17/2022 in [facility] Department at [facility]  C-SSRS RISK CATEGORY No Risk No Risk No Risk  Collaboration of Care: Collaboration of Care: Other Dr. Carvin  Patient/Guardian was advised Release of Information must be obtained prior to any record release in order to collaborate their care with an outside provider. Patient/Guardian was advised if they have not already done so to contact the registration department to sign all necessary forms in order for us  to release information regarding their care.   Consent: Patient/Guardian gives verbal consent for treatment and assignment of benefits for services provided during this visit. Patient/Guardian expressed understanding and agreed to proceed.   Santrice Muzio, MD 8/13/202510:49 AM

## 2024-07-02 NOTE — Telephone Encounter (Signed)
 Patient presents to clinic to pick up shipment. Medication provided per note from [place].   Cassandra Reid English, RN

## 2024-07-06 DIAGNOSIS — E1142 Type 2 diabetes mellitus with diabetic polyneuropathy: Secondary | ICD-10-CM | POA: Diagnosis not present

## 2024-07-09 ENCOUNTER — Ambulatory Visit: Admitting: Psychology

## 2024-07-14 ENCOUNTER — Ambulatory Visit: Admitting: Psychology

## 2024-07-15 ENCOUNTER — Ambulatory Visit (HOSPITAL_COMMUNITY)

## 2024-07-15 ENCOUNTER — Encounter (HOSPITAL_COMMUNITY): Payer: Self-pay

## 2024-07-15 ENCOUNTER — Telehealth (HOSPITAL_COMMUNITY): Payer: Self-pay

## 2024-07-15 NOTE — Telephone Encounter (Signed)
 Patient had an appointment today 07/15/2024 at 9:45 AM.  Called the patient at [phone], left a HIPAA compliant voicemail.  Appointment was marked as a No-Show.   Cassandra Reid

## 2024-07-23 DIAGNOSIS — M5481 Occipital neuralgia: Secondary | ICD-10-CM | POA: Diagnosis not present

## 2024-07-25 ENCOUNTER — Other Ambulatory Visit: Payer: Self-pay | Admitting: Cardiology

## 2024-07-25 ENCOUNTER — Other Ambulatory Visit: Payer: Self-pay | Admitting: Family Medicine

## 2024-07-28 ENCOUNTER — Encounter: Payer: Self-pay | Admitting: Student

## 2024-07-28 ENCOUNTER — Ambulatory Visit: Admitting: Student

## 2024-07-28 VITALS — BP 122/82 | HR 77 | Ht 61.0 in | Wt 175.0 lb

## 2024-07-28 DIAGNOSIS — E1142 Type 2 diabetes mellitus with diabetic polyneuropathy: Secondary | ICD-10-CM | POA: Diagnosis not present

## 2024-07-28 DIAGNOSIS — Z794 Long term (current) use of insulin: Secondary | ICD-10-CM | POA: Diagnosis not present

## 2024-07-28 DIAGNOSIS — Z23 Encounter for immunization: Secondary | ICD-10-CM | POA: Diagnosis not present

## 2024-07-28 DIAGNOSIS — M5030 Other cervical disc degeneration, unspecified cervical region: Secondary | ICD-10-CM

## 2024-07-28 DIAGNOSIS — H548 Legal blindness, as defined in USA: Secondary | ICD-10-CM

## 2024-07-28 DIAGNOSIS — Z9181 History of falling: Secondary | ICD-10-CM

## 2024-07-28 DIAGNOSIS — R296 Repeated falls: Secondary | ICD-10-CM

## 2024-07-28 LAB — POCT GLYCOSYLATED HEMOGLOBIN (HGB A1C): HbA1c, POC (controlled diabetic range): 7.6 % — AB (ref 0.0–7.0)

## 2024-07-28 NOTE — Progress Notes (Signed)
    SUBJECTIVE:   CHIEF COMPLAINT / HPI:   Cassandra Reid is a 68 y.o. female presenting for T2DM follow up.   T2DM:  Medications: Tresiba  32 units BID, Ozempic  2 mg weekly, aspart sliding scale, Farxiga  10 mg  She reports her sugars at home have been much better controlled with splitting the insulin  to BID. Her sugars have ranged from 120-170s.  She reports a few lows with the Dexcom but when she manually checks them with a finger stick they have been within a good range.   Falls/Decreased Ambulation: reported a few minor flls at home since she has moved. Has a call button she can pressure but has not needed to do currently. Her risk of falls and unsteady gait 2/2 to chronic lumbar DDD has limited her on going outside of her house. Her daughter has ordered a walker for her to try.    PERTINENT  PMH / PSH: reviewed and updated.  OBJECTIVE:   BP 122/82   Pulse 77   Ht 5' 1 (1.549 m)   Wt 175 lb (79.4 kg)   SpO2 98%   BMI 33.07 kg/m   Well-appearing, no acute distress Cardio: Regular rate, regular rhythm, no murmurs on exam. Pulm: Clear, no wheezing, no crackles. No increased work of breathing Abdominal: bowel sounds present, soft, non-tender, non-distended Extremities: no peripheral edema  Neuro: alert and oriented x3, speech normal in content, no facial asymmetry, strength intact and equal bilaterally in UE and LE, pupils equal and reactive to light.  ASSESSMENT/PLAN:   Assessment & Plan Type 2 diabetes mellitus with diabetic polyneuropathy, with long-term current use of insulin  A1c within goal today. Continue current medications as prescribed.  Continues to need Dexcom for monitoring of glucose while on insulin   Follow up in 3 months  At high risk for falls Discussion of referral to PT for motorized wheelchair assessment.  Patient will consider and let me know if she is amendable  Appears to be in a safer environment now at senior living.    Receiving Pneumonia shot  today. Will get Flu and Covid in the following weeks.    Damien Pinal, DO [facility]

## 2024-07-28 NOTE — Addendum Note (Signed)
 Addended by: HYLA RAISIN D on: 07/28/2024 11:19 AM   Modules accepted: Orders

## 2024-07-28 NOTE — Patient Instructions (Signed)
 It was great to see you today!   No changes on your insulin  today.   Future Appointments  Date Time Provider Department Center  06/03/2025  2:50 PM [facility] ANNUAL WELLNESS VISIT [facility]    Please arrive 15 minutes before your appointment to ensure smooth check in process.    Please call the clinic at [phone] if your symptoms worsen or you have any concerns.  Thank you for allowing me to participate in your care, Dr. Damien Pinal [facility]

## 2024-07-28 NOTE — Assessment & Plan Note (Signed)
 A1c within goal today. Continue current medications as prescribed.  Continues to need Dexcom for monitoring of glucose while on insulin   Follow up in 3 months

## 2024-07-29 ENCOUNTER — Encounter: Payer: Self-pay | Admitting: Hematology and Oncology

## 2024-08-12 NOTE — Telephone Encounter (Signed)
 After speaking w/pt's husband this morning, pt has never called [facility] for COVID testing or her A1C check,due to this surgery for 10/28/2020 is canceled.

## 2024-08-12 NOTE — Telephone Encounter (Signed)
  To Close This Visit  Required   No notes found on encounter.     My Open Encounters (Newest Message First) View All Conversations on this Encounter You  You3 years ago    I had Cassandra Reid from [facility] call me and stated that Cassandra Reid has not returned any of their calls for covid test before her surgery and her A1c's are off. I left messages on pt home and mobile # and then I did reach her husband and gave him the info for her to call  [facility] ASAP. He confirmed that their home # was not good.   You  Cassandra Reid, Cassandra Reid [phone] years ago      Questionnaires  No completed forms available for this encounter.

## 2024-08-14 ENCOUNTER — Encounter: Payer: Self-pay | Admitting: Student

## 2024-08-18 ENCOUNTER — Telehealth: Payer: Self-pay

## 2024-08-18 ENCOUNTER — Other Ambulatory Visit: Payer: Self-pay | Admitting: Cardiology

## 2024-08-18 NOTE — Telephone Encounter (Signed)
 In process of completing Novo Nordisk refills for patients OZEMPIC  2MG  DOSE medication.  Application in Dr. Stephannie box awaiting signature.

## 2024-08-19 ENCOUNTER — Other Ambulatory Visit

## 2024-08-19 ENCOUNTER — Ambulatory Visit: Admitting: Hematology and Oncology

## 2024-08-19 NOTE — Telephone Encounter (Signed)
 Faxed completed refill form to novo nordisk.

## 2024-08-20 ENCOUNTER — Telehealth: Payer: Self-pay

## 2024-08-20 NOTE — Telephone Encounter (Signed)
 Last shipment for 2025.  For 2026 renewal patient will need to meet new income/ FPL requirements with Novo Nordisk:  Medicare patients on insulin  whose household income is below 150% of the federal poverty level will need to apply for Low Income Subsidy/Extra Help with Social Security. If denied, we will be able to use that letter to attempt enrollment/re-enrollment with the company for all other products.  Patient aware.

## 2024-08-21 ENCOUNTER — Telehealth: Payer: Self-pay | Admitting: Pharmacist

## 2024-08-21 NOTE — Telephone Encounter (Signed)
 This patient is appearing on a report for being at risk of failing the adherence measure for cholesterol (statin) medications this calendar year.   Medication: Atorvastatin  reported used and denies use of rosuvastatin  (although filled twice earlier in the year).  Patient no longer taking rosuvastatin  per her report.  Last fill date: 03/20/2024 for 90 day supply of rosuvastatin   Unclear which statin patient has in possession but reports taking daily. Follow-up at next PCP visit to clarify.

## 2024-08-25 ENCOUNTER — Telehealth: Payer: Self-pay

## 2024-08-25 NOTE — Telephone Encounter (Signed)
   Sent a renewal text link to patients phone via [employer] site. Will f/u with patient.

## 2024-08-31 ENCOUNTER — Ambulatory Visit

## 2024-09-02 ENCOUNTER — Inpatient Hospital Stay: Admitting: Hematology and Oncology

## 2024-09-02 ENCOUNTER — Inpatient Hospital Stay: Attending: Hematology and Oncology

## 2024-09-02 ENCOUNTER — Other Ambulatory Visit: Payer: Self-pay | Admitting: *Deleted

## 2024-09-02 VITALS — BP 120/66 | HR 78 | Temp 97.7°F | Resp 18 | Ht 61.0 in | Wt 175.4 lb

## 2024-09-02 DIAGNOSIS — Z79899 Other long term (current) drug therapy: Secondary | ICD-10-CM | POA: Insufficient documentation

## 2024-09-02 DIAGNOSIS — R5383 Other fatigue: Secondary | ICD-10-CM | POA: Diagnosis not present

## 2024-09-02 DIAGNOSIS — D751 Secondary polycythemia: Secondary | ICD-10-CM | POA: Insufficient documentation

## 2024-09-02 DIAGNOSIS — R0602 Shortness of breath: Secondary | ICD-10-CM | POA: Insufficient documentation

## 2024-09-02 LAB — CBC WITH DIFFERENTIAL (CANCER CENTER ONLY)
Abs Immature Granulocytes: 0.01 K/uL (ref 0.00–0.07)
Basophils Absolute: 0.1 K/uL (ref 0.0–0.1)
Basophils Relative: 1 %
Eosinophils Absolute: 0.1 K/uL (ref 0.0–0.5)
Eosinophils Relative: 2 %
HCT: 45.2 % (ref 36.0–46.0)
Hemoglobin: 14.6 g/dL (ref 12.0–15.0)
Immature Granulocytes: 0 %
Lymphocytes Relative: 17 %
Lymphs Abs: 1 K/uL (ref 0.7–4.0)
MCH: 26 pg (ref 26.0–34.0)
MCHC: 32.3 g/dL (ref 30.0–36.0)
MCV: 80.6 fL (ref 80.0–100.0)
Monocytes Absolute: 0.4 K/uL (ref 0.1–1.0)
Monocytes Relative: 8 %
Neutro Abs: 4.2 K/uL (ref 1.7–7.7)
Neutrophils Relative %: 72 %
Platelet Count: 200 K/uL (ref 150–400)
RBC: 5.61 MIL/uL — ABNORMAL HIGH (ref 3.87–5.11)
RDW: 17.7 % — ABNORMAL HIGH (ref 11.5–15.5)
WBC Count: 5.9 K/uL (ref 4.0–10.5)
nRBC: 0 % (ref 0.0–0.2)

## 2024-09-02 LAB — CMP (CANCER CENTER ONLY)
ALT: 41 U/L (ref 0–44)
AST: 29 U/L (ref 15–41)
Albumin: 4.3 g/dL (ref 3.5–5.0)
Alkaline Phosphatase: 98 U/L (ref 38–126)
Anion gap: 8 (ref 5–15)
BUN: 19 mg/dL (ref 8–23)
CO2: 25 mmol/L (ref 22–32)
Calcium: 10.3 mg/dL (ref 8.9–10.3)
Chloride: 107 mmol/L (ref 98–111)
Creatinine: 1.12 mg/dL — ABNORMAL HIGH (ref 0.44–1.00)
GFR, Estimated: 54 mL/min — ABNORMAL LOW (ref >60)
Glucose, Bld: 119 mg/dL — ABNORMAL HIGH (ref 70–99)
Potassium: 4 mmol/L (ref 3.5–5.1)
Sodium: 140 mmol/L (ref 135–145)
Total Bilirubin: 0.4 mg/dL (ref 0.0–1.2)
Total Protein: 7.1 g/dL (ref 6.5–8.1)

## 2024-09-02 NOTE — Assessment & Plan Note (Signed)
 Lab review 06/26/2022: Hemoglobin 15, hematocrit 47 03/18/2023: Erythropoietin : 13, JAK2 mutation: Negative 05/20/2023: Hemoglobin 17.1, hematocrit 52.1, WBC 7.5, platelets 189 08/08/2023: Hemoglobin 16.5, hematocrit 47.9 09/26/23: Hb 14.8, Hct: 44.8 09/02/2024: Hematocrit 45.2   the patient has secondary polycythemia.  Possibly related to her lung condition. Phlebotomy: 08/12/2023, 11/26/2023, 02/24/2024, 05/25/2024   Goal of phlebotomy: Hematocrit below 45 Return to clinic in 3 month with labs and follow-up

## 2024-09-02 NOTE — Telephone Encounter (Signed)
Shipment given to patient.

## 2024-09-02 NOTE — Progress Notes (Signed)
 Patient Care Team: Cleotilde Perkins, DO as PCP - General (Family Medicine) Pietro Redell RAMAN, MD as PCP - Cardiology (Cardiology) [facility], Donell Cardinal, MD as Consulting Physician (Endocrinology) Skeet Juliene SAUNDERS, DO as Consulting Physician (Neurology) Neda Jennet LABOR, MD as Consulting Physician (Pulmonary Disease)  DIAGNOSIS:  Encounter Diagnosis  Name Primary?   Polycythemia, secondary Yes   CHIEF COMPLIANT: Follow-up of history of secondary polycythemia  HISTORY OF PRESENT ILLNESS: History of Present Illness Cassandra Reid is a 68 year old female with polycythemia who presents for routine follow-up and blood removal.  She experiences fatigue and shortness of breath during ambulation, which improve following phlebotomy. Her hematocrit is 45.2, and hemoglobin is 14.6. She undergoes blood removal every three months, with her last session being recent.     ALLERGIES:  has no known allergies.  MEDICATIONS:  Current Outpatient Medications  Medication Sig Dispense Refill   albuterol  (VENTOLIN  HFA) 108 (90 Base) MCG/ACT inhaler INHALE 2 PUFFS INTO THE LUNGS 2 (TWO) TIMES DAILY. PLUS 2 PUFFS AS NEEDED 18 each 1   amLODipine  (NORVASC ) 5 MG tablet Take 1 tablet (5 mg total) by mouth daily. Please call [phone] to schedule an overdue appointment for future refills. Thank you. 2nd attempt. 15 tablet 0   Ascorbic Acid (VITAMIN C ADULT GUMMIES PO) Take 1 each by mouth daily in the afternoon.     atorvastatin  (LIPITOR) 40 MG tablet Take 40 mg by mouth daily.     BD PEN NEEDLE NANO 2ND GEN 32G X 4 MM MISC USE WITH INSULIN  100 each 4   blood glucose meter kit and supplies Dispense based on patient and insurance preference. Use up to four times daily as directed. (FOR ICD-10 E10.9, E11.9). 1 each 0   budeson-glycopyrrolate-formoterol  (BREZTRI  AEROSPHERE) 160-9-4.8 MCG/ACT AERO inhaler Inhale 2 puffs into the lungs in the morning and at bedtime. 3 each 3   busPIRone  (BUSPAR ) 7.5 MG tablet  7.5 mg.     Continuous Blood Gluc Sensor (DEXCOM G6 SENSOR) MISC Use as directed to check blood sugar. 4 each 11   Continuous Blood Gluc Transmit (DEXCOM G6 TRANSMITTER) MISC Use as directed to check blood sugar. 1 each 12   dapagliflozin  propanediol (FARXIGA ) 10 MG TABS tablet Take 1 tablet (10 mg total) by mouth daily. 90 tablet 3   DULoxetine  (CYMBALTA ) 60 MG capsule Take 1 capsule (60 mg total) by mouth daily. 90 capsule 3   ezetimibe  (ZETIA ) 10 MG tablet TAKE 1 TABLET EVERY DAY 90 tablet 3   famotidine  (PEPCID ) 20 MG tablet Take 1 tablet (20 mg total) by mouth daily. 90 tablet 3   FIBER SELECT GUMMIES PO Take 1 tablet by mouth daily in the afternoon.     fluticasone  (FLONASE ) 50 MCG/ACT nasal spray USE 1 SPRAY INTO BOTH NOSTRILS DAILY AS NEEDED FOR ALLERGIES OR RHINITIS. 16 g 5   furosemide  (LASIX ) 40 MG tablet Take 1 tablet (40 mg total) by mouth as needed for edema.     gabapentin  (NEURONTIN ) 300 MG capsule Take 1 capsule (300 mg total) by mouth 3 (three) times daily. 90 capsule 0   insulin  aspart (FIASP ) 100 UNIT/ML FlexTouch Pen Inject 18-20 Units into the skin daily with breakfast AND 16-18 Units daily with lunch AND 14-16 Units daily with supper. This is a patient assistance medication..     Insulin  Pen Needle (NOVOFINE PLUS PEN NEEDLE) 32G X 4 MM MISC Inject 1 Device into the skin as directed. 100 each 11   lisinopril  (  ZESTRIL ) 40 MG tablet TAKE 1 TABLET EVERY DAY 90 tablet 3   loratadine  (CLARITIN ) 10 MG tablet TAKE 1 TABLET EVERY DAY 90 tablet 3   LORazepam  (ATIVAN ) 0.5 MG tablet TAKE 1 TABLET EVERY 8 HOURS AS NEEDED FOR ANXIETY 90 tablet 0   metoprolol  succinate (TOPROL -XL) 50 MG 24 hr tablet TAKE 1 TABLET (50 MG TOTAL) BY MOUTH DAILY. TAKE WITH OR IMMEDIATELY FOLLOWING A MEAL. 90 tablet 3   Multiple Vitamin (MULTIVITAMIN WITH MINERALS) TABS tablet Take 1 tablet by mouth in the morning.     OneTouch Delica Lancets 33G MISC Please use to check blood sugar up to 4 times daily. E11.42  100 each 12   Oyster Shell (OYSTER CALCIUM ) 500 MG TABS tablet Take 500 mg of elemental calcium  by mouth daily in the afternoon.     pantoprazole  (PROTONIX ) 40 MG tablet TAKE 1 TABLET EVERY DAY 90 tablet 3   potassium chloride  SA (KLOR-CON  M) 20 MEQ tablet TAKE 1 TABLET AS NEEDED (TAKE AS DIRECTED WITH LASIX  AS NEEDED FOR EDEMA) 90 tablet 1   Semaglutide , 2 MG/DOSE, (OZEMPIC , 2 MG/DOSE,) 8 MG/3ML SOPN Inject 2 mg into the skin once a week. 3 mL 11   TRESIBA  FLEXTOUCH 100 UNIT/ML FlexTouch Pen Inject 32 Units into the skin 2 (two) times daily.     TRUE METRIX BLOOD GLUCOSE TEST test strip TEST BLOOD SUGAR AS INSTRUCTED 350 strip 3   Current Facility-Administered Medications  Medication Dose Route Frequency Provider Last Rate Last Admin   0.9 %  sodium chloride  infusion  500 mL Intravenous Once Legrand Victory CROME III, MD        PHYSICAL EXAMINATION: ECOG PERFORMANCE STATUS: 1 - Symptomatic but completely ambulatory  Vitals:   09/02/24 1018  BP: 120/66  Pulse: 78  Resp: 18  Temp: 97.7 F (36.5 C)  SpO2: 98%   Filed Weights   09/02/24 1018  Weight: 175 lb 6.4 oz (79.6 kg)      LABORATORY DATA:  I have reviewed the data as listed    Latest Ref Rng & Units 09/02/2024    9:53 AM 05/25/2024    2:18 PM 02/24/2024    2:11 PM  CMP  Glucose 70 - 99 mg/dL 880  796  815   BUN 8 - 23 mg/dL 19  12  18    Creatinine 0.44 - 1.00 mg/dL 8.87  8.81  8.95   Sodium 135 - 145 mmol/L 140  138  137   Potassium 3.5 - 5.1 mmol/L 4.0  4.0  4.3   Chloride 98 - 111 mmol/L 107  106  108   CO2 22 - 32 mmol/L 25  25  23    Calcium  8.9 - 10.3 mg/dL 89.6  89.6  9.6   Total Protein 6.5 - 8.1 g/dL 7.1  6.6  7.3   Total Bilirubin 0.0 - 1.2 mg/dL 0.4  0.4  0.5   Alkaline Phos 38 - 126 U/L 98  83  87   AST 15 - 41 U/L 29  34  26   ALT 0 - 44 U/L 41  35  38     Lab Results  Component Value Date   WBC 5.9 09/02/2024   HGB 14.6 09/02/2024   HCT 45.2 09/02/2024   MCV 80.6 09/02/2024   PLT 200 09/02/2024    NEUTROABS 4.2 09/02/2024    ASSESSMENT & PLAN:  Polycythemia, secondary Lab review 06/26/2022: Hemoglobin 15, hematocrit 47 03/18/2023: Erythropoietin : 13, JAK2 mutation: Negative 05/20/2023:  Hemoglobin 17.1, hematocrit 52.1, WBC 7.5, platelets 189 08/08/2023: Hemoglobin 16.5, hematocrit 47.9 09/26/23: Hb 14.8, Hct: 44.8 09/02/2024: Hematocrit 45.2   the patient has secondary polycythemia.  Possibly related to her lung condition. Phlebotomy: 08/12/2023, 11/26/2023, 02/24/2024, 05/25/2024   Goal of phlebotomy: Hematocrit below 45 Return to clinic in 3 month with labs and follow-up     No orders of the defined types were placed in this encounter.  The patient has a good understanding of the overall plan. she agrees with it. she will call with any problems that may develop before the next visit here.  I personally spent a total of 30 minutes in the care of the patient today including preparing to see the patient, getting/reviewing separately obtained history, performing a medically appropriate exam/evaluation, counseling and educating, placing orders, referring and communicating with other health care professionals, documenting clinical information in the EHR, independently interpreting results, communicating results, and coordinating care.   Viinay K Kellan Boehlke, MD 09/02/24

## 2024-09-04 ENCOUNTER — Other Ambulatory Visit: Payer: Self-pay

## 2024-09-04 DIAGNOSIS — E1142 Type 2 diabetes mellitus with diabetic polyneuropathy: Secondary | ICD-10-CM

## 2024-09-04 MED ORDER — TRUE METRIX BLOOD GLUCOSE TEST VI STRP: ORAL_STRIP | 3 refills | Status: AC

## 2024-09-07 NOTE — Telephone Encounter (Signed)
 Received notification from [employer] regarding approval for FARXIGA  & BREZTRI . Patient assistance approved from 11/19/24 to 11/18/25.  Medication will ship to PATIENTS HOME.  Pt ID: [id number]  Company phone: [phone]

## 2024-09-09 ENCOUNTER — Inpatient Hospital Stay

## 2024-09-09 NOTE — Telephone Encounter (Signed)
 Rec'd 3 boxes of Ozempic  2mg  dose pens 09/04/24

## 2024-09-15 ENCOUNTER — Other Ambulatory Visit: Payer: Self-pay | Admitting: Nurse Practitioner

## 2024-09-16 ENCOUNTER — Encounter: Payer: Self-pay | Admitting: Student

## 2024-09-16 ENCOUNTER — Ambulatory Visit: Admitting: Orthopedic Surgery

## 2024-09-16 ENCOUNTER — Other Ambulatory Visit (INDEPENDENT_AMBULATORY_CARE_PROVIDER_SITE_OTHER): Payer: Self-pay

## 2024-09-16 ENCOUNTER — Other Ambulatory Visit: Payer: Self-pay

## 2024-09-16 DIAGNOSIS — M79604 Pain in right leg: Secondary | ICD-10-CM

## 2024-09-16 DIAGNOSIS — E1122 Type 2 diabetes mellitus with diabetic chronic kidney disease: Secondary | ICD-10-CM

## 2024-09-16 MED ORDER — LISINOPRIL 40 MG PO TABS: 40.0000 mg | ORAL_TABLET | Freq: Every day | ORAL | 0 refills | Status: DC

## 2024-09-17 ENCOUNTER — Encounter: Payer: Self-pay | Admitting: Student

## 2024-09-17 ENCOUNTER — Telehealth: Payer: Self-pay | Admitting: Student

## 2024-09-17 ENCOUNTER — Ambulatory Visit (INDEPENDENT_AMBULATORY_CARE_PROVIDER_SITE_OTHER): Admitting: Student

## 2024-09-17 ENCOUNTER — Encounter: Payer: Self-pay | Admitting: Orthopedic Surgery

## 2024-09-17 VITALS — BP 141/62 | HR 83 | Ht 61.0 in | Wt 177.8 lb

## 2024-09-17 DIAGNOSIS — R296 Repeated falls: Secondary | ICD-10-CM

## 2024-09-17 MED ORDER — FUROSEMIDE 40 MG PO TABS: 40.0000 mg | ORAL_TABLET | ORAL | 3 refills | Status: DC | PRN

## 2024-09-17 NOTE — Telephone Encounter (Signed)
 Shipment provided to patient.   Chiquita JAYSON English, RN

## 2024-09-17 NOTE — Progress Notes (Signed)
 Office Visit Note   Patient: Cassandra Reid           Date of Birth: Oct 20, 1956           MRN: [id number] Visit Date: 09/16/2024 Requested by: Cleotilde Perkins, DO [address] [place],  [place] [address] PCP: Cleotilde Perkins, DO  Subjective: Chief Complaint  Patient presents with   Other    Back/leg pain    HPI: Cassandra Reid is a 68 y.o. female who presents to the office reporting acute onset of low back pain with radicular pain and weakness affecting the right side.  Reports some falling and instability.  Describes low back pain radiating around that right side down into her right foot with numbness and tingling.  Describes a deep aching pain in that right thigh and groin which does radiate down into the leg.  Patient states she does get cortisone shots for occipital neuralgia and her neck shot cannot really be for 6 weeks because of her reaction to cortisone.  She is diabetic and it does make her glucose go up..                ROS: All systems reviewed are negative as they relate to the chief complaint within the history of present illness.  Patient denies fevers or chills.  Assessment & Plan: Visit Diagnoses:  1. Pain in right leg     Plan: Impression is right leg and groin pain with some weakness and falling.  She has pretty good strength today on exam and no groin pain with internal or external rotation of the leg.  No arthritis in the hip on radiographs on the right-hand side.  This is likely coming from her back.  Because of the weakness and falling as well as the radiating pattern which is consistent with radiculopathy and MRI scan of that MRI scan is indicated.  Follow-up after that study.  Follow-Up Instructions: No follow-ups on file.   Orders:  Orders Placed This Encounter  Procedures   XR Lumbar Spine 2-3 Views   XR HIP UNILAT W OR W/O PELVIS 2-3 VIEWS RIGHT   MR Lumbar Spine w/o contrast   No orders of the defined types were placed in this encounter.      Procedures: No procedures performed   Clinical Data: No additional findings.  Objective: Vital Signs: There were no vitals taken for this visit.  Physical Exam:  Constitutional: Patient appears well-developed HEENT:  Head: Normocephalic Eyes:EOM are normal Neck: Normal range of motion Cardiovascular: Normal rate Pulmonary/chest: Effort normal Neurologic: Patient is alert Skin: Skin is warm Psychiatric: Patient has normal mood and affect  Ortho Exam: Ortho exam demonstrates about 5- out of 5 hip flexion on the right compared to the left but otherwise abduction and adduction quad hamstring and ankle dorsiflexion plantarflexion strength is intact.  No groin pain with internal or external rotation of either leg.  No masses lymphadenopathy or skin changes noted in that back or trochanteric region.  Both feet are perfused and sensate.  Specialty Comments:  CLINICAL DATA:  Neck pain and bilateral arm pain with weakness and numbness for 1 year.   EXAM: MRI CERVICAL SPINE WITHOUT CONTRAST   TECHNIQUE: Multiplanar, multisequence MR imaging of the cervical spine was performed. No intravenous contrast was administered.   COMPARISON:  MRI cervical spine 04/14/2019   FINDINGS: Alignment: Normal   Vertebrae: Normal marrow signal.  No bone lesions or fractures.   Cord: Normal cord signal intensity.  No cord lesions or syrinx.   Posterior Fossa, vertebral arteries, paraspinal tissues: No significant findings.   Disc levels:   C2-3: No significant findings.   C3-4: Mild annular bulge. No disc protrusion, spinal or foraminal stenosis.   C4-5: Mild bilateral facet disease and slight bulging annulus but no spinal or foraminal stenosis.   C5-6: Progressive degenerative disc disease and facet disease. Bulging degenerated annulus, osteophytic ridging and uncinate spurring contributing to mild spinal stenosis and moderate bilateral foraminal stenosis. There is also a persistent  small left paracentral disc protrusion with mass effect on the left side of the thecal sac and contributing to medial foraminal stenosis on that side.   C6-7: Bulging degenerated annulus, osteophytic ridging and uncinate spurring contributing to mild spinal and mild to moderate bilateral foraminal stenosis. This is slightly progressive when compared to the prior MRI.   C7-T1: No significant findings.   IMPRESSION: 1. Progressive degenerative disc disease and facet disease at C5-6 with mild spinal stenosis and moderate bilateral foraminal stenosis. There is also a persistent small left paracentral disc protrusion with mass effect on the left side of the thecal sac and contributing to medial foraminal stenosis on that side. 2. Slightly progressive mild spinal and mild to moderate bilateral foraminal stenosis at C6-7.     Electronically Signed   By: MYRTIS Stammer M.D.   On: 08/30/2023 13:45  Imaging: No results found.   PMFS History: Patient Active Problem List   Diagnosis Date Noted   Restrictive lung disease 03/19/2023   Polycythemia, secondary 03/19/2023   Chronic sinusitis 11/23/2022   Paroxysmal atrial tachycardia 11/24/2021   Cerebral aneurysm without rupture    Major depressive disorder    Generalized anxiety disorder    Incontinence 09/22/2021   Obesity (BMI 30.0-34.9) 05/25/2021   Osteoporosis 01/18/2021   Fatty liver 05/31/2020   Myofascial pain dysfunction syndrome 04/23/2019   DDD (degenerative disc disease), cervical 04/16/2019   CKD stage 3 due to type 2 diabetes mellitus 04/15/2019   Hypertension associated with diabetes 07/18/2018   GERD (gastroesophageal reflux disease) 07/18/2018   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   Fatty liver 05/31/2020   Generalized anxiety disorder    GERD (gastroesophageal reflux disease)    Hyperlipidemia associated with type 2 diabetes mellitus 07/18/2018   Hypertension associated with diabetes 07/18/2018   Incontinence 09/22/2021   Bowel and bladder.    Legally blind    Major depressive disorder    Osteoporosis 01/18/2021   Paroxysmal atrial tachycardia    On telemetry   Type 2 diabetes mellitus with diabetic polyneuropathy, with long-term current use of insulin  07/18/2018   has dexcom G7  - sensor left upper arm    Family History  Problem Relation Age of Onset   Alcohol abuse Mother    Diabetes Mother    Hypertension Mother    Kidney disease Mother    COPD Father    Diabetes Father  Early death Father    Heart disease Father    Hyperlipidemia Father    Hypertension Father    Diabetes Sister    Hypertension Sister    Breast cancer Sister    Diabetes Sister    Hypertension Sister    Kidney disease Sister    Cancer Maternal Grandmother    Alcohol abuse Maternal Grandfather    Cancer Maternal Grandfather    Cancer Paternal Grandmother    Cancer Paternal Grandfather     Past Surgical History:  Procedure Laterality Date   ABDOMINAL HYSTERECTOMY  2012   APPENDECTOMY  1972   CEREBRAL ANEURYSM REPAIR Left 2004   Aneurysm clip in place near the level of the left ICA on Brain MRI 08/2020. Also coiling performed   CHOLECYSTECTOMY  2015   COLONOSCOPY  11/24/2020   in [facility]   CRANIOTOMY Left    left pterional craniotomy   ETHMOIDECTOMY Left 11/23/2022   Procedure: LEFT ANTERIOR ETHMOIDECTOMY;  Surgeon: Llewellyn Gerard LABOR,  DO;  Location: [facility] OR;  Service: ENT;  Laterality: Left;   EYE SURGERY  2023   MAXILLARY ANTROSTOMY Left 11/23/2022   Procedure: MAXILLARY ANTROSTOMY;  Surgeon: Llewellyn Gerard LABOR, DO;  Location: [facility] OR;  Service: ENT;  Laterality: Left;   MENISCUS REPAIR Bilateral    NASAL TURBINATE REDUCTION Bilateral 11/23/2022   Procedure: Bilateral turbinate reduction;  Surgeon: Llewellyn Gerard LABOR, DO;  Location: [facility] OR;  Service: ENT;  Laterality: Bilateral;   SINUS ENDO W/FUSION Left 11/23/2022   Procedure: Functional endoscopic sinus surgery with navigation.;  Surgeon: Llewellyn Gerard LABOR, DO;  Location: [facility] OR;  Service: ENT;  Laterality: Left;   TUBAL LIGATION  1990   UPPER GI ENDOSCOPY  11/24/2020   in [facility]   Social History   Occupational History   Occupation: disabled  Tobacco Use   Smoking status: Former    Current packs/day: 0.00    Types: Cigarettes    Quit date: 06/1985    Years since quitting: 39.2    Passive exposure: Past   Smokeless tobacco: Never  Vaping Use   Vaping status: Never Used  Substance and Sexual Activity   Alcohol use: Never   Drug use: Not Currently    Types: Cocaine, Marijuana    Comment: 1976-1980   Sexual activity: Not Currently    Birth control/protection: Surgical    Comment: Hysterectomy

## 2024-09-17 NOTE — Telephone Encounter (Signed)
 DME placed for wheelchair

## 2024-09-17 NOTE — Progress Notes (Signed)
    SUBJECTIVE:   CHIEF COMPLAINT / HPI:   Cassandra Reid is a 68 y.o. female presenting for wheelchair DME order.   She has experienced multiple falls inside her home which prompted her to move to a senior assisted living facility.  She continues to have falls inside her home when she is showering, dressing and preparing food.  No prior hospitalizations for falls.  Has been through PT for balance without significant improvement  Fear of falling has confined her to her house which has negatively impacted her mental health Also increasing her risk for falls is her legally blind status   PERTINENT  PMH / PSH: reviewed and updated.  OBJECTIVE:   BP (!) 141/62   Pulse 83   Ht 5' 1 (1.549 m)   Wt 177 lb 12.8 oz (80.6 kg)   SpO2 99%   BMI 33.60 kg/m   Well-appearing, no acute distress, ambulating with cane with unsteady gait  Cardio: Regular rate, regular rhythm, no murmurs on exam. Pulm: Clear, no wheezing, no crackles. No increased work of breathing Abdominal: bowel sounds present, soft, non-tender, non-distended  ASSESSMENT/PLAN:   Assessment & Plan Multiple falls Would benefit from home manual wheelchair due to falls with toileting, bathing and dressing. Falling increases her overall mortality risk.  Has tried a cane and a walker without significant improvement of falls. With neurological symptoms from her lower back and blindness, it is hard for her to keep her balance.  She is living in a senior home that is compatible for wheelchairs including adequate access between rooms, maneuvering space, and surfaces for use of the manual wheelchair that is provided. Having a wheelchair at home will significantly improve her ability to bathe, dress, and toilet herself which gives her more independence within her home.  She is currently willing to use the manual wheelchair that would be provided in her home.  She has appropriate upper extremity function and other physical and mental  capabilities needed to safely self-propel the manual wheelchair that is provided in the home during a typical day. She does not have any upper extremity limitations at this time.  Currently being worked up for lumbar radiculopathy that is causing her pain and increasing her incidence of falls. MRI is pending per ortho.  DME order placed for wheelchair. Telephone note routed to RN    Damien Pinal, DO [facility]

## 2024-09-21 ENCOUNTER — Encounter: Payer: Self-pay | Admitting: Radiology

## 2024-09-21 ENCOUNTER — Other Ambulatory Visit: Payer: Self-pay | Admitting: Student

## 2024-09-21 ENCOUNTER — Other Ambulatory Visit (HOSPITAL_COMMUNITY): Payer: Self-pay | Admitting: Psychiatry

## 2024-09-21 DIAGNOSIS — M797 Fibromyalgia: Secondary | ICD-10-CM

## 2024-09-21 DIAGNOSIS — F32A Depression, unspecified: Secondary | ICD-10-CM

## 2024-09-21 DIAGNOSIS — G894 Chronic pain syndrome: Secondary | ICD-10-CM

## 2024-09-21 DIAGNOSIS — F411 Generalized anxiety disorder: Secondary | ICD-10-CM

## 2024-09-21 NOTE — Telephone Encounter (Signed)
 Receipt confirmed by [employer].   Cassandra JAYSON English, RN

## 2024-09-21 NOTE — Telephone Encounter (Signed)
 Community message sent to [employer]. Will await response.   Veronda Prude, RN

## 2024-09-22 ENCOUNTER — Ambulatory Visit: Admitting: Orthopaedic Surgery

## 2024-09-29 ENCOUNTER — Ambulatory Visit: Admitting: Orthopaedic Surgery

## 2024-09-30 ENCOUNTER — Encounter: Payer: Self-pay | Admitting: Student

## 2024-10-05 DIAGNOSIS — E1142 Type 2 diabetes mellitus with diabetic polyneuropathy: Secondary | ICD-10-CM | POA: Diagnosis not present

## 2024-10-07 ENCOUNTER — Encounter: Payer: Self-pay | Admitting: Orthopedic Surgery

## 2024-10-07 ENCOUNTER — Other Ambulatory Visit

## 2024-10-12 NOTE — Progress Notes (Signed)
 Cardiology Office Note   Date:  10/21/2024  ID:  Cassandra, Reid [birth year], MRN [id number] PCP: Cleotilde Perkins, DO  [facility] Providers Cardiologist:  Redell Shallow, MD   History of Present Illness Cassandra Reid is a 68 y.o. female with a past medical history of HLD, history of palpitations and chest pain. Presents today for an annual follow up appointment   Patient previously wore a cardiac monitor in 04/2020 that showed sinus rhythm with PACs and brief episodes of atrial tachycardia. Coronary CTA in 03/2020 showed a coronary calcium  score of 0, no coronary disease, myocardial bridge mid-distal LAD noted.   Later, cardiac monitor in 06/2022 showed sinus rhythm with rare PVCs and PACs. There were 17 brief runs of PAT. Echocardiogram in 07/2022 showed EF 60-65%, no regional wall motion abnormalities, grade I DD, normal RV systolic function, no significant valvular abnormalities.   Coronary CTA in 01/2023 showed a coronary calcium  score of 0, no evidence of CAD, mid LAD myocardial bridge.   On interview, patient tells me that she has continued to have stable chest pain and palpitations for years. She has random episodes of chest pain. Believes that this is associated with fibromyalgia. She was having this chest pain prior to her coronary CTA in 01/2023 which was a reassuring study. She denies worsening chest pain. Pain is not exertional. If she does exert herself, she has some palpitations and shortness of breath. These calm down if she rests. Patient also tells me that she has been having somewhat frequent falls. She denies syncope. Reports that she usually falls because she trips over something. She has been struggling with R leg pain and weakness and is pending MRI for further evaluation. She occasionally gets dizzy if she stands up too quickly, but this is not why she falls. Has a history of vertigo and still has occasional episodes. She takes lasix  20 mg daily   Studies  Reviewed EKG Interpretation Date/Time:  Wednesday October 21 2024 14:41:26 EST Ventricular Rate:  69 PR Interval:  188 QRS Duration:  76 QT Interval:  390 QTC Calculation: 417 R Axis:   64  Text Interpretation: Normal sinus rhythm Low voltage QRS When compared with ECG of 25-Jun-2023 11:02, Questionable change in QRS axis Confirmed by Vicci Sauer [phone]) on 10/21/2024 2:42:54 PM    Cardiac Studies & Procedures   ______________________________________________________________________________________________     ECHOCARDIOGRAM  ECHOCARDIOGRAM COMPLETE 08/03/2022  Narrative ECHOCARDIOGRAM REPORT    Patient Name:   Cassandra Reid Date of Exam: 08/03/2022 Medical Rec #:  [id number]      Height:       61.0 in Accession #:    [id number]     Weight:       175.0 lb Date of Birth:  [birth year]     BSA:          1.785 m Patient Age:    65 years       BP:           136/60 mmHg Patient Gender: F              HR:           71 bpm. Exam Location:  [address]  Procedure: 2D Echo, Cardiac Doppler, Color Doppler and Strain Analysis  Indications:    R06.09 Dyspnea on exertion  History:        Patient has prior history of Echocardiogram examinations, most recent 03/26/2020. Arrythmias:Atrial tachycardia.; Risk Factors:Hypertension, Sleep Apnea, Diabetes and  Former Smoker. Orthostatic hypotension. CKD stage 3. Obesity.  Sonographer:    Marshia Lawyer BS, RDCS Referring Phys: [id number] [name]  IMPRESSIONS   1. Left ventricular ejection fraction, by estimation, is 60 to 65%. The left ventricle has normal function. The left ventricle has no regional wall motion abnormalities. Left ventricular diastolic parameters are consistent with Grade I diastolic dysfunction (impaired relaxation). The average left ventricular global longitudinal strain is -20.5 %. The global longitudinal strain is normal. 2. Right ventricular systolic function is normal. The right ventricular size is normal. 3.  The mitral valve is normal in structure. No evidence of mitral valve regurgitation. No evidence of mitral stenosis. 4. The aortic valve is normal in structure. Aortic valve regurgitation is not visualized. No aortic stenosis is present. 5. The inferior vena cava is normal in size with greater than 50% respiratory variability, suggesting right atrial pressure of 3 mmHg.  Comparison(s): No significant change from prior study. Prior images reviewed side by side. 03/26/20 EF 65-70%.  FINDINGS Left Ventricle: Left ventricular ejection fraction, by estimation, is 60 to 65%. The left ventricle has normal function. The left ventricle has no regional wall motion abnormalities. The average left ventricular global longitudinal strain is -20.5 %. The global longitudinal strain is normal. The left ventricular internal cavity size was normal in size. There is no left ventricular hypertrophy. Left ventricular diastolic parameters are consistent with Grade I diastolic dysfunction (impaired relaxation). Normal left ventricular filling pressure.  Right Ventricle: The right ventricular size is normal. No increase in right ventricular wall thickness. Right ventricular systolic function is normal.  Left Atrium: Left atrial size was normal in size.  Right Atrium: Right atrial size was normal in size.  Pericardium: There is no evidence of pericardial effusion.  Mitral Valve: The mitral valve is normal in structure. No evidence of mitral valve regurgitation. No evidence of mitral valve stenosis.  Tricuspid Valve: The tricuspid valve is normal in structure. Tricuspid valve regurgitation is not demonstrated. No evidence of tricuspid stenosis.  Aortic Valve: The aortic valve is normal in structure. Aortic valve regurgitation is not visualized. No aortic stenosis is present.  Pulmonic Valve: The pulmonic valve was normal in structure. Pulmonic valve regurgitation is not visualized. No evidence of pulmonic  stenosis.  Aorta: The aortic root is normal in size and structure.  Venous: The inferior vena cava is normal in size with greater than 50% respiratory variability, suggesting right atrial pressure of 3 mmHg.  IAS/Shunts: No atrial level shunt detected by color flow Doppler.   LEFT VENTRICLE PLAX 2D LVIDd:         4.30 cm   Diastology LVIDs:         2.50 cm   LV e' medial:    7.05 cm/s LV PW:         0.80 cm   LV E/e' medial:  7.7 LV IVS:        0.70 cm   LV e' lateral:   6.89 cm/s LVOT diam:     2.10 cm   LV E/e' lateral: 7.8 LV SV:         64 LV SV Index:   36        2D Longitudinal Strain LVOT Area:     3.46 cm  2D Strain GLS (A2C):   -19.2 % 2D Strain GLS (A3C):   -20.9 % 2D Strain GLS (A4C):   -21.4 % 2D Strain GLS Avg:     -20.5 %  RIGHT VENTRICLE  IVC RV Basal diam:  2.80 cm    IVC diam: 1.80 cm RV S prime:     8.11 cm/s TAPSE (M-mode): 1.6 cm  LEFT ATRIUM             Index        RIGHT ATRIUM           Index LA diam:        3.70 cm 2.07 cm/m   RA Pressure: 3.00 mmHg LA Vol (A2C):   35.5 ml 19.89 ml/m  RA Area:     6.77 cm LA Vol (A4C):   26.0 ml 14.57 ml/m  RA Volume:   11.00 ml  6.16 ml/m LA Biplane Vol: 32.1 ml 17.99 ml/m AORTIC VALVE LVOT Vmax:   95.60 cm/s LVOT Vmean:  64.000 cm/s LVOT VTI:    0.186 m  AORTA Ao Root diam: 3.20 cm Ao Asc diam:  3.10 cm  MITRAL VALVE               TRICUSPID VALVE Estimated RAP:  3.00 mmHg MV Decel Time: 218 msec MV E velocity: 54.00 cm/s  SHUNTS MV A velocity: 82.30 cm/s  Systemic VTI:  0.19 m MV E/A ratio:  0.66        Systemic Diam: 2.10 cm  Mihai Croitoru MD Electronically signed by Jerel Balding MD Signature Date/Time: 08/03/2022/3:09:33 PM    Final    MONITORS  LONG TERM MONITOR (3-14 DAYS) 07/18/2022  Narrative Patch Wear Time:  10 days and 0 hours (2023-08-09T16:38:26-0400 to 2023-08-19T16:58:30-0400)  Patient had a min HR of 55 bpm, max HR of 210 bpm, and avg HR of 75 bpm. Predominant  underlying rhythm was Sinus Rhythm. 17 Supraventricular Tachycardia runs occurred, the run with the fastest interval lasting 5 beats with a max rate of 210 bpm, the longest lasting 10.0 secs with an avg rate of 127 bpm. Some episode of Supraventricular Tachycardia may be possible Atrial Tachycardia with variable block. Supraventricular Tachycardia was detected within +/- 45 seconds of symptomatic patient event(s). Isolated SVEs were rare (<1.0%), SVE Couplets were rare (<1.0%), and SVE Triplets were rare (<1.0%). Isolated VEs were rare (<1.0%, 835), VE Couplets were rare (<1.0%, 2), and VE Triplets were rare (<1.0%, 1).   Sinus bradycardia, NSR, sinus tachycardia; PACs, brief runs of PAT, PVCs and 3 beats NSVT Redell Shallow   CT SCANS  CT CORONARY MORPH W/CTA COR W/SCORE 01/22/2023  Addendum 01/23/2023 10:10 PM ADDENDUM REPORT: 01/23/2023 22:08  EXAM: OVER-READ INTERPRETATION  CT CHEST  The following report is an over-read performed by radiologist Dr. Oneil Devonshire of [facility] on 01/23/2023. This over-read does not include interpretation of cardiac or coronary anatomy or pathology. The coronary calcium  score/coronary CTA interpretation by the cardiologist is attached.  COMPARISON:  03/27/2019  FINDINGS: Cardiovascular: Atherosclerotic calcifications of the thoracic aorta are noted. No evidence of pulmonary emboli.  Mediastinum/Nodes: There are no enlarged lymph nodes within the visualized mediastinum.The esophagus is within normal limits.  Lungs/Pleura: There is no pleural effusion. The visualized lungs appear clear.  Upper abdomen: No significant findings in the visualized upper abdomen.  Musculoskeletal/Chest wall: No chest wall mass or suspicious osseous findings within the visualized chest.  IMPRESSION: Aortic Atherosclerosis (ICD10-I70.0).  No other significant extracardiac findings within the visualized chest.   Electronically Signed By: Oneil Devonshire  M.D. On: 01/23/2023 22:08  Narrative CLINICAL DATA:  68 year old with chest pain.  EXAM: Cardiac/Coronary  CTA  TECHNIQUE: The patient was scanned  on a [employer].  FINDINGS: A 80 kV prospective scan was triggered in the descending thoracic aorta at 111 HU's. Axial non-contrast 3 mm slices were carried out through the heart. The data set was analyzed on a dedicated work station and scored using the Agatson method. Gantry rotation speed was 250 msecs and collimation was .6 mm. 0.8 mg of sl NTG was given. The 3D data set was reconstructed in 5% intervals of the 67-82 % of the R-R cycle. Diastolic phases were analyzed on a dedicated work station using MPR, MIP and VRT modes. The patient received 80 cc of contrast.  Image quality: good  Aorta: Normal size (29 mm ascending). Aortic atherosclerosis. No dissection.  Aortic Valve:  Trileaflet.  No calcifications.  Coronary Arteries:  Normal coronary origin.  Right dominance.  RCA is a large co-dominant artery that gives rise to PDA and PLA. There is no plaque.  Left main is a large artery that gives rise to LAD and LCX arteries.  LAD is a large vessel that has no plaque. Mid LAD myocardial bridge.  LCX is a co-dominant artery.  There is no plaque.  OM1-3, mid PDA branch, small caliber, no plaque  Ramus: Small caliber, no plaque  Other findings:  Normal pulmonary vein drainage into the left atrium.  Normal left atrial appendage without a thrombus.  Normal size of the pulmonary artery.  Please see radiology report for non cardiac findings.  IMPRESSION: 1. Coronary calcium  score of 0.  2. Normal coronary origin with co dominance.  3. No evidence of CAD.  4. Mid LAD myocardial bridge.  CAD-RADS 0. No evidence of CAD (0%). Consider non-atherosclerotic causes of chest pain.  Electronically Signed: By: Oneil Parchment M.D. On: 01/22/2023 13:27   CT SCANS  CT CORONARY MORPH W/CTA COR W/SCORE  03/26/2020  Addendum 03/28/2020  9:19 AM ADDENDUM REPORT: 03/28/2020 09:17  EXAM: OVER-READ INTERPRETATION  CT CHEST  The following report is an over-read performed by radiologist Dr. Toribio Cove [facility] on 03/28/2020. This over-read does not include interpretation of cardiac or coronary anatomy or pathology. The coronary calcium  score and cardiac CTA interpretation by the cardiologist is attached.  COMPARISON:  None.  FINDINGS: Within the visualized portions of the thorax there are no suspicious appearing pulmonary nodules or masses, there is no acute consolidative airspace disease, no pleural effusions, no pneumothorax and no lymphadenopathy. Visualized portions of the upper abdomen demonstrates diffuse low attenuation throughout the visualized hepatic parenchyma, indicative of hepatic steatosis. There are no aggressive appearing lytic or blastic lesions noted in the visualized portions of the skeleton.  IMPRESSION: 1. Hepatic steatosis.   Electronically Signed By: Toribio Aye M.D. On: 03/28/2020 09:17  Narrative HISTORY: 68 yo female with chest pain/anginal equiv, 3yr CHD risk 10-20%  EXAM: Cardiac/Coronary CTA  TECHNIQUE: The patient was scanned on a [employer].  PROTOCOL: A 120 kV prospective scan was triggered in the descending thoracic aorta at 111 HU's. Axial non-contrast 3 mm slices were carried out through the heart. The data set was analyzed on a dedicated work station and scored using the Agatson method. Gantry rotation speed was 250 msecs and collimation was .6 mm. Beta blockade and 0.8 mg of sl NTG was given. The 3D data set was reconstructed in 5% intervals of the 67-82 % of the R-R cycle. Diastolic phases were analyzed on a dedicated work station using MPR, MIP and VRT modes. The patient received 80mL OMNIPAQUE  IOHEXOL  350 MG/ML SOLN  of contrast.  FINDINGS: Quality: Good (HR 72)  Coronary calcium  score: The  patient's coronary artery calcium  score is 0, which places the patient in the 0 percentile.  Coronary arteries: Normal coronary origins.  Right dominance.  Right Coronary Artery: Dominant vessel.  No significant stenosis.  Left Main Coronary Artery: Normal. Bifurcates into the LAD and LCx arteries.  Left Anterior Descending Coronary Artery: Tortuous vessel that does not reach the apex. No stenosis. 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  Ramus Intermedius: Moderate-sized branch without stenosis.  Left Circumflex Artery: Large caliber AV groove vessel without stenosis.  Aorta: Normal size, 28 mm at the mid ascending aorta (level of the PA bifurcation) measured double oblique. No calcifications. No dissection.  Aortic Valve: Trileaflet.  No calcifications.  Other findings:  Normal pulmonary vein drainage into the left atrium.  Normal left atrial appendage without a thrombus.  Normal size of the pulmonary artery.  IMPRESSION: 1. No evidence of CAD, CADRADS = 0. 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  2. Coronary calcium  score of 0.  3. Normal coronary origin with right dominance.  Electronically Signed: By: Vinie JAYSON Maxcy M.D. On: 03/26/2020 17:28     ______________________________________________________________________________________________      Risk Assessment/Calculations           Physical Exam VS:  BP 130/62 (BP Location: Right Arm, Patient Position: Sitting, Cuff Size: Large)   Pulse 69   Ht 5' 1 (1.549 m)   Wt 177 lb (80.3 kg)   SpO2 97%   BMI 33.44 kg/m        Wt Readings from Last 3 Encounters:  10/21/24 177 lb (80.3 kg)  09/17/24 177 lb 12.8 oz (80.6 kg)  09/02/24 175 lb 6.4 oz (79.6 kg)    GEN: Well nourished, well developed in no acute distress. Sitting comfortably on the exam table  NECK: No JVD; No carotid bruits CARDIAC:  RRR, no murmurs, rubs,  gallops RESPIRATORY:  Clear to auscultation without rales, wheezing or rhonchi. Normal WOB on room air   ABDOMEN: Soft, non-tender, non-distended EXTREMITIES:  No edema in BLE; No deformity   ASSESSMENT AND PLAN  Chest pain  - Coronary CTA in 01/2023 showed a coronary calcium  score of 0, no evidence of CAD, mid LAD myocardial bridge - Patient has continued to have random episodes of chest pain that have been stable since before coronary CTA. Pain has not worsened over time. Not exertional. She thinks it is related to her fibromyalgia, and she also has shoulder, hip, knee, and back pain  - EKG today showed no ischemic changes  - No indication for repeat ischemic evaluation. Encouraged patient to follow up with PCP for non-cardiac chest pain  - Continue amlodipine  5 mg daily and metoprolol  succinate 50 mg daily   History of mitral valve prolapse  - Echocardiogram in 07/2022 showed that mitral valve was normal in structure   Palpitations  - Cardiac monitors in the past have picked up brief episodes of atrial tachycardia  - Patient tells me that she has palpitations when she exerts herself and go away if she rests.  - Continue metoprolol  succinate 50 mg daily   HTN  - BP well controlled  - Continue amlodipine  5 mg daily, lisinopril  40 mg daily, metoprolol  succinate 50 mg daily - K 4.0, creatinine 1.12 on 09/02/24   HLD  - Lipid panel from 09/2023 showed LDL 28, HDL 44,  triglycerides 96, total cholesterol 91  - continue lipitor 40 mg daily and zetia  10 mg daily   Falls  - Patient tells me that she has been having mechanical falls. Usually because she trips over something or due to leg weakness. She denies syncope.  - Followed by PCP and ortho    Dispo: Follow up in 1 year with Dr. Pietro   Signed, Rollo FABIENE Louder, PA-C

## 2024-10-20 ENCOUNTER — Ambulatory Visit: Admitting: Student

## 2024-10-21 ENCOUNTER — Encounter: Payer: Self-pay | Admitting: Cardiology

## 2024-10-21 ENCOUNTER — Ambulatory Visit: Attending: Cardiology | Admitting: Cardiology

## 2024-10-21 VITALS — BP 130/62 | HR 69 | Ht 61.0 in | Wt 177.0 lb

## 2024-10-21 DIAGNOSIS — E782 Mixed hyperlipidemia: Secondary | ICD-10-CM | POA: Diagnosis not present

## 2024-10-21 DIAGNOSIS — N183 Chronic kidney disease, stage 3 unspecified: Secondary | ICD-10-CM | POA: Diagnosis not present

## 2024-10-21 DIAGNOSIS — R002 Palpitations: Secondary | ICD-10-CM

## 2024-10-21 DIAGNOSIS — R072 Precordial pain: Secondary | ICD-10-CM

## 2024-10-21 DIAGNOSIS — E1122 Type 2 diabetes mellitus with diabetic chronic kidney disease: Secondary | ICD-10-CM

## 2024-10-21 DIAGNOSIS — I10 Essential (primary) hypertension: Secondary | ICD-10-CM | POA: Diagnosis not present

## 2024-10-21 MED ORDER — LISINOPRIL 40 MG PO TABS: 40.0000 mg | ORAL_TABLET | Freq: Every day | ORAL | 3 refills | Status: DC

## 2024-10-21 NOTE — Patient Instructions (Signed)
 Medication Instructions:  NO CHANGES *If you need a refill on your cardiac medications before your next appointment, please call your pharmacy*  Lab Work: NO LABS If you have labs (blood work) drawn today and your tests are completely normal, you will receive your results only by: MyChart Message (if you have MyChart) OR A paper copy in the mail If you have any lab test that is abnormal or we need to change your treatment, we will call you to review the results.  Testing/Procedures: NO TESTING  Follow-Up: At [facility], you and your health needs are our priority.  As part of our continuing mission to provide you with exceptional heart care, our providers are all part of one team.  This team includes your primary Cardiologist (physician) and Advanced Practice Providers or APPs (Physician Assistants and Nurse Practitioners) who all work together to provide you with the care you need, when you need it.  Your next appointment:   1 year(s)  Provider:   Alexandria Angel, MD

## 2024-11-02 ENCOUNTER — Ambulatory Visit: Admitting: Student

## 2024-11-03 ENCOUNTER — Ambulatory Visit: Admitting: Student

## 2024-11-03 ENCOUNTER — Encounter: Payer: Self-pay | Admitting: Student

## 2024-11-03 DIAGNOSIS — E1142 Type 2 diabetes mellitus with diabetic polyneuropathy: Secondary | ICD-10-CM

## 2024-11-03 DIAGNOSIS — Z794 Long term (current) use of insulin: Secondary | ICD-10-CM | POA: Diagnosis not present

## 2024-11-03 MED ORDER — BREZTRI AEROSPHERE 160-9-4.8 MCG/ACT IN AERO: 2.0000 | INHALATION_SPRAY | Freq: Two times a day (BID) | RESPIRATORY_TRACT | 3 refills | Status: DC

## 2024-11-03 MED ORDER — DAPAGLIFLOZIN PROPANEDIOL 10 MG PO TABS: 10.0000 mg | ORAL_TABLET | Freq: Every day | ORAL | 3 refills | Status: DC

## 2024-11-03 MED ORDER — INSULIN GLARGINE 100 UNIT/ML ~~LOC~~ SOLN: 32.0000 [IU] | Freq: Two times a day (BID) | SUBCUTANEOUS | 11 refills | Status: DC

## 2024-11-03 MED ORDER — METFORMIN HCL ER 500 MG PO TB24: 500.0000 mg | ORAL_TABLET | Freq: Every day | ORAL | 3 refills | Status: AC

## 2024-11-03 NOTE — Patient Instructions (Signed)
 It was great to see you today!   I will send in the new prescriptions.   I have ordered lab work today. I will send you a message through MyChart or send you a letter with your results. If there is an abnormal result, I will give you a call.    Future Appointments  Date Time Provider Department Center  11/09/2024  7:00 AM GI-315 MR 3 [id number] [facility] [address] [facility]  12/03/2024  3:15 PM [facility] LAB [facility] None  12/03/2024  4:00 PM [facility] INFUSION [facility] None  03/03/2025  3:00 PM [facility] LAB [facility] None  03/03/2025  3:45 PM [facility] INFUSION [facility] None  06/02/2025  3:00 PM [facility] LAB [facility] None  06/02/2025  3:45 PM [facility] INFUSION [facility] None  06/03/2025  2:50 PM [facility] ANNUAL WELLNESS VISIT [facility]  09/02/2025  1:15 PM [facility] LAB [facility] None  09/02/2025  1:45 PM Odean Potts, MD [facility] None    Please arrive 15 minutes before your appointment to ensure smooth check in process.  If you are more than 15 minutes late, you may be asked to reschedule.   Please bring a list of your medications with you to all appointments.   Please call the clinic at [phone] if your symptoms worsen or you have any concerns.  Thank you for allowing me to participate in your care, Dr. Damien Pinal [facility]

## 2024-11-03 NOTE — Progress Notes (Signed)
° ° °  SUBJECTIVE:   CHIEF COMPLAINT / HPI:   Cassandra Reid is a 68 y.o. female presenting for diabetes follow up.   She has been doing well with her medications. Unfortunately her insurance will be increasing her co-pays for medications making them unaffordable for her.   PERTINENT  PMH / PSH: reviewed and updated.  OBJECTIVE:   BP (!) 126/52   Pulse 85   Ht 5' 1 (1.549 m)   Wt 175 lb 3.2 oz (79.5 kg)   SpO2 100%   BMI 33.10 kg/m   Well-appearing, no acute distress, ambulates with walker Cardio: Regular rate, regular rhythm, no murmurs on exam. Pulm: Clear, no wheezing, no crackles. No increased work of breathing Abdominal: bowel sounds present, soft, non-tender, non-distended Extremities: no peripheral edema   ASSESSMENT/PLAN:   Assessment & Plan Type 2 diabetes mellitus with diabetic polyneuropathy, with long-term current use of insulin  Checking A1c today  Patient reports her insurance will no longer cover Tresiba  32 units BID she is requesting a new prescription of Lantus  32 units BID Ozempic  will also not be covered.  Will send new script for Glargine at 32 units BID Will also send script for Metformin  500 mg daily  She will need follow up in 1-3 months depending on her blood sugar control      Damien Pinal, DO [facility]

## 2024-11-03 NOTE — Assessment & Plan Note (Signed)
 Checking A1c today  Patient reports her insurance will no longer cover Tresiba  32 units BID she is requesting a new prescription of Lantus  32 units BID Ozempic  will also not be covered.  Will send new script for Glargine at 32 units BID Will also send script for Metformin  500 mg daily  She will need follow up in 1-3 months depending on her blood sugar control

## 2024-11-04 ENCOUNTER — Ambulatory Visit: Payer: Self-pay | Admitting: Student

## 2024-11-04 ENCOUNTER — Encounter: Payer: Self-pay | Admitting: Student

## 2024-11-04 LAB — HEMOGLOBIN A1C
Est. average glucose Bld gHb Est-mCnc: 157 mg/dL
Hgb A1c MFr Bld: 7.1 % — ABNORMAL HIGH (ref 4.8–5.6)

## 2024-11-06 ENCOUNTER — Telehealth: Payer: Self-pay

## 2024-11-06 DIAGNOSIS — E1142 Type 2 diabetes mellitus with diabetic polyneuropathy: Secondary | ICD-10-CM

## 2024-11-06 NOTE — Telephone Encounter (Signed)
 Received a refill request fax from [employer] patient assistance company for patients FARXIGA  medication.   Please send a 90 day supply (with refills) to [facility] if applicable. Thanks!

## 2024-11-09 ENCOUNTER — Other Ambulatory Visit

## 2024-11-09 MED ORDER — DAPAGLIFLOZIN PROPANEDIOL 10 MG PO TABS: 10.0000 mg | ORAL_TABLET | Freq: Every day | ORAL | 3 refills | Status: AC

## 2024-11-09 NOTE — Telephone Encounter (Signed)
 Refill for Farxiga  sent

## 2024-11-16 ENCOUNTER — Telehealth: Payer: Self-pay

## 2024-11-16 NOTE — Telephone Encounter (Signed)
 Called back to pharmacy.   Spoke with technician who transferred to pharmacist.   Updated rx to Lantus  Solostar pen.   Chiquita JAYSON English, RN

## 2024-11-16 NOTE — Telephone Encounter (Signed)
 Received call from [facility] pharmacy regarding Lantus  prescription.   They are requesting clarification on if rx is supposed to be for Lantus  pen or vial.   I can call pharmacy back with verbal for whichever is preferred/indicated.   CB number for pharmacy is [phone].  Chiquita JAYSON English, RN

## 2024-11-17 ENCOUNTER — Encounter: Payer: Self-pay | Admitting: Student

## 2024-11-17 DIAGNOSIS — Z794 Long term (current) use of insulin: Secondary | ICD-10-CM

## 2024-11-17 MED ORDER — BREZTRI AEROSPHERE 160-9-4.8 MCG/ACT IN AERO: 2.0000 | INHALATION_SPRAY | Freq: Two times a day (BID) | RESPIRATORY_TRACT | 3 refills | Status: AC

## 2024-11-19 ENCOUNTER — Telehealth: Admitting: Family Medicine

## 2024-11-19 DIAGNOSIS — B9689 Other specified bacterial agents as the cause of diseases classified elsewhere: Secondary | ICD-10-CM | POA: Diagnosis not present

## 2024-11-19 DIAGNOSIS — J019 Acute sinusitis, unspecified: Secondary | ICD-10-CM

## 2024-11-19 MED ORDER — AMOXICILLIN-POT CLAVULANATE 875-125 MG PO TABS: 1.0000 | ORAL_TABLET | Freq: Two times a day (BID) | ORAL | 0 refills | Status: AC

## 2024-11-19 NOTE — Progress Notes (Signed)
 " Virtual Visit Consent   Cassandra Reid, you are scheduled for a virtual visit with a [facility] provider today. Just as with appointments in the office, your consent must be obtained to participate. Your consent will be active for this visit and any virtual visit you may have with one of our providers in the next 365 days. If you have a MyChart account, a copy of this consent can be sent to you electronically.  As this is a virtual visit, video technology does not allow for your provider to perform a traditional examination. This may limit your provider's ability to fully assess your condition. If your provider identifies any concerns that need to be evaluated in person or the need to arrange testing (such as labs, EKG, etc.), we will make arrangements to do so. Although advances in technology are sophisticated, we cannot ensure that it will always work on either your end or our end. If the connection with a video visit is poor, the visit may have to be switched to a telephone visit. With either a video or telephone visit, we are not always able to ensure that we have a secure connection.  By engaging in this virtual visit, you consent to the provision of healthcare and authorize for your insurance to be billed (if applicable) for the services provided during this visit. Depending on your insurance coverage, you may receive a charge related to this service.  I need to obtain your verbal consent now. Are you willing to proceed with your visit today? Cassandra Reid has provided verbal consent on 11/19/2024 for a virtual visit (video or telephone). Loa Lamp, FNP  Date: 11/19/2024 10:19 AM   Virtual Visit via Video Note   I, Loa Lamp, connected with  Cassandra Reid  ([id number], 69 Years) on 11/19/2024 at 10:15 AM EST by a video-enabled telemedicine application and verified that I am speaking with the correct person using two identifiers.  Location: Patient: Virtual Visit Location Patient:  Home Provider: Virtual Visit Location Provider: Home Office   I discussed the limitations of evaluation and management by telemedicine and the availability of in person appointments. The patient expressed understanding and agreed to proceed.    History of Present Illness: Cassandra Reid is a 69 y.o. who identifies as a female who was assigned female sinus  at birth, and is being seen today for sinus problems for 2 weeks, sinus pressure and pain, diarrhea has resolved. No fever. Ear pain. Dry cough. No wheezing. No fever. Chronic sob.  SABRA  HPI: HPI  Problems:  Patient Active Problem List   Diagnosis Date Noted   Restrictive lung disease 03/19/2023   Polycythemia, secondary 03/19/2023   Chronic sinusitis 11/23/2022   Paroxysmal atrial tachycardia 11/24/2021   Cerebral aneurysm without rupture    Major depressive disorder    Generalized anxiety disorder    Incontinence 09/22/2021   Obesity (BMI 30.0-34.9) 05/25/2021   Osteoporosis 01/18/2021   Fatty liver 05/31/2020   Myofascial pain dysfunction syndrome 04/23/2019   DDD (degenerative disc disease), cervical 04/16/2019   CKD stage 3 due to type 2 diabetes mellitus 04/15/2019   Hypertension associated with diabetes 07/18/2018   GERD (gastroesophageal reflux disease) 07/18/2018   Complex sleep apnea syndrome 07/18/2018   Type 2 diabetes mellitus with diabetic polyneuropathy, with long-term current use of insulin  07/18/2018   HLD, secondary prevention, LDL goal <70 07/18/2018   Legally blind 09/01/2003    Allergies: Allergies[1] Medications: Current Medications[2]  Observations/Objective: Patient is  well-developed, well-nourished in no acute distress.  Resting comfortably  at home.  Head is normocephalic, atraumatic.  No labored breathing.  Speech is clear and coherent with logical content.  Patient is alert and oriented at baseline.    Assessment and Plan: 1. Acute bacterial sinusitis (Primary)  Increase fluids, humidifier  at night, UC as needed.   Follow Up Instructions: I discussed the assessment and treatment plan with the patient. The patient was provided an opportunity to ask questions and all were answered. The patient agreed with the plan and demonstrated an understanding of the instructions.  A copy of instructions were sent to the patient via MyChart unless otherwise noted below.     The patient was advised to call back or seek an in-person evaluation if the symptoms worsen or if the condition fails to improve as anticipated.    Illa Enlow, FNP     [1] No Known Allergies [2]  Current Outpatient Medications:    albuterol  (VENTOLIN  HFA) 108 (90 Base) MCG/ACT inhaler, INHALE 2 PUFFS INTO THE LUNGS 2 (TWO) TIMES DAILY. PLUS 2 PUFFS AS NEEDED, Disp: 18 each, Rfl: 1   amLODipine  (NORVASC ) 5 MG tablet, Take 1 tablet (5 mg total) by mouth daily. Please call [phone] to schedule an overdue appointment for future refills. Thank you. 2nd attempt., Disp: 15 tablet, Rfl: 0   Ascorbic Acid (VITAMIN C ADULT GUMMIES PO), Take 1 each by mouth daily in the afternoon., Disp: , Rfl:    atorvastatin  (LIPITOR) 40 MG tablet, Take 40 mg by mouth daily., Disp: , Rfl:    BD PEN NEEDLE NANO 2ND GEN 32G X 4 MM MISC, USE WITH INSULIN , Disp: 100 each, Rfl: 4   blood glucose meter kit and supplies, Dispense based on patient and insurance preference. Use up to four times daily as directed. (FOR ICD-10 E10.9, E11.9)., Disp: 1 each, Rfl: 0   budesonide -glycopyrrolate-formoterol  (BREZTRI  AEROSPHERE) 160-9-4.8 MCG/ACT AERO inhaler, Inhale 2 puffs into the lungs in the morning and at bedtime., Disp: 3 each, Rfl: 3   busPIRone  (BUSPAR ) 7.5 MG tablet, 7.5 mg., Disp: , Rfl:    Continuous Blood Gluc Sensor (DEXCOM G6 SENSOR) MISC, Use as directed to check blood sugar., Disp: 4 each, Rfl: 11   Continuous Blood Gluc Transmit (DEXCOM G6 TRANSMITTER) MISC, Use as directed to check blood sugar., Disp: 1 each, Rfl: 12   dapagliflozin   propanediol (FARXIGA ) 10 MG TABS tablet, Take 1 tablet (10 mg total) by mouth daily., Disp: 90 tablet, Rfl: 3   DULoxetine  (CYMBALTA ) 60 MG capsule, Take 1 capsule (60 mg total) by mouth daily., Disp: 90 capsule, Rfl: 3   ezetimibe  (ZETIA ) 10 MG tablet, TAKE 1 TABLET EVERY DAY, Disp: 90 tablet, Rfl: 3   famotidine  (PEPCID ) 20 MG tablet, Take 1 tablet (20 mg total) by mouth daily., Disp: 90 tablet, Rfl: 3   FIBER SELECT GUMMIES PO, Take 1 tablet by mouth daily in the afternoon., Disp: , Rfl:    fluticasone  (FLONASE ) 50 MCG/ACT nasal spray, USE 1 SPRAY INTO BOTH NOSTRILS DAILY AS NEEDED FOR ALLERGIES OR RHINITIS., Disp: 16 g, Rfl: 5   furosemide  (LASIX ) 40 MG tablet, Take 1 tablet (40 mg total) by mouth as needed for edema., Disp: 30 tablet, Rfl: 3   gabapentin  (NEURONTIN ) 300 MG capsule, Take 1 capsule (300 mg total) by mouth 3 (three) times daily., Disp: 90 capsule, Rfl: 0   glucose blood (TRUE METRIX BLOOD GLUCOSE TEST) test strip, Use as instructed, Disp: 350 strip, Rfl:  3   insulin  aspart (FIASP ) 100 UNIT/ML FlexTouch Pen, Inject 18-20 Units into the skin daily with breakfast AND 16-18 Units daily with lunch AND 14-16 Units daily with supper. This is a patient assistance medication.., Disp: , Rfl:    insulin  glargine (LANTUS ) 100 UNIT/ML injection, Inject 0.32 mLs (32 Units total) into the skin 2 (two) times daily., Disp: 10 mL, Rfl: 11   Insulin  Pen Needle (NOVOFINE PLUS PEN NEEDLE) 32G X 4 MM MISC, Inject 1 Device into the skin as directed., Disp: 100 each, Rfl: 11   lisinopril  (ZESTRIL ) 40 MG tablet, Take 1 tablet (40 mg total) by mouth daily. Patient must call and schedule overdue annual appointment last attempt, Disp: 90 tablet, Rfl: 3   loratadine  (CLARITIN ) 10 MG tablet, TAKE 1 TABLET EVERY DAY, Disp: 90 tablet, Rfl: 3   LORazepam  (ATIVAN ) 0.5 MG tablet, TAKE 1 TABLET EVERY 8 HOURS AS NEEDED FOR ANXIETY, Disp: 90 tablet, Rfl: 0   metFORMIN  (GLUCOPHAGE -XR) 500 MG 24 hr tablet, Take 1 tablet  (500 mg total) by mouth daily with breakfast., Disp: 180 tablet, Rfl: 3   metoprolol  succinate (TOPROL -XL) 50 MG 24 hr tablet, TAKE 1 TABLET (50 MG TOTAL) BY MOUTH DAILY. TAKE WITH OR IMMEDIATELY FOLLOWING A MEAL., Disp: 90 tablet, Rfl: 3   Multiple Vitamin (MULTIVITAMIN WITH MINERALS) TABS tablet, Take 1 tablet by mouth in the morning., Disp: , Rfl:    OneTouch Delica Lancets 33G MISC, Please use to check blood sugar up to 4 times daily. E11.42, Disp: 100 each, Rfl: 12   Oyster Shell (OYSTER CALCIUM ) 500 MG TABS tablet, Take 500 mg of elemental calcium  by mouth daily in the afternoon., Disp: , Rfl:    pantoprazole  (PROTONIX ) 40 MG tablet, TAKE 1 TABLET EVERY DAY, Disp: 90 tablet, Rfl: 3   potassium chloride  SA (KLOR-CON  M) 20 MEQ tablet, TAKE 1 TABLET AS NEEDED (TAKE AS DIRECTED WITH LASIX  AS NEEDED FOR EDEMA), Disp: 90 tablet, Rfl: 1  Current Facility-Administered Medications:    0.9 %  sodium chloride  infusion, 500 mL, Intravenous, Once, Danis, Victory LITTIE MOULD, MD  "

## 2024-11-19 NOTE — Patient Instructions (Signed)

## 2024-11-23 ENCOUNTER — Other Ambulatory Visit (HOSPITAL_COMMUNITY): Payer: Self-pay

## 2024-11-24 ENCOUNTER — Telehealth: Payer: Self-pay | Admitting: Student

## 2024-11-24 MED ORDER — INSULIN GLARGINE 100 UNIT/ML ~~LOC~~ SOLN: 32.0000 [IU] | Freq: Two times a day (BID) | SUBCUTANEOUS | 11 refills | Status: AC

## 2024-11-24 MED ORDER — FIASP 100 UNIT/ML IJ SOLN: INTRAMUSCULAR | 3 refills | Status: AC

## 2024-11-24 NOTE — Telephone Encounter (Signed)
 Pt dropped off form at front desk for Insurance & letter.  Verified that patient section of form has been completed.  Last DOS/WCC with PCP was 11/03/2024.  Placed form in [facility] team folder to be completed by clinical staff.  Cassandra Reid

## 2024-11-24 NOTE — Addendum Note (Signed)
 Addended by: Jameelah Watts on: 11/24/2024 12:55 PM   Modules accepted: Orders

## 2024-11-24 NOTE — Telephone Encounter (Signed)
 Reviewed form and placed in PCP's box for completion.  Cena JONELLE Pesa, CMA

## 2024-11-25 ENCOUNTER — Other Ambulatory Visit: Payer: Self-pay | Admitting: Student

## 2024-11-25 DIAGNOSIS — E1142 Type 2 diabetes mellitus with diabetic polyneuropathy: Secondary | ICD-10-CM

## 2024-11-25 MED ORDER — DEXCOM G7 SENSOR MISC: 4 refills | Status: AC

## 2024-11-25 NOTE — Progress Notes (Signed)
 Dexcom G7 sent to [facility] pharmacy

## 2024-11-27 NOTE — Telephone Encounter (Signed)
 Patient called and informed that forms are ready for pick up. Copy made and placed in batch scanning. Original placed at front desk for pick up.   Veronda Prude, RN

## 2024-11-29 ENCOUNTER — Other Ambulatory Visit: Payer: Self-pay | Admitting: Cardiology

## 2024-11-29 DIAGNOSIS — E876 Hypokalemia: Secondary | ICD-10-CM

## 2024-12-01 ENCOUNTER — Other Ambulatory Visit: Payer: Self-pay | Admitting: Student

## 2024-12-01 DIAGNOSIS — F32A Depression, unspecified: Secondary | ICD-10-CM

## 2024-12-02 ENCOUNTER — Other Ambulatory Visit: Payer: Self-pay | Admitting: *Deleted

## 2024-12-02 DIAGNOSIS — D751 Secondary polycythemia: Secondary | ICD-10-CM

## 2024-12-03 ENCOUNTER — Inpatient Hospital Stay: Attending: Hematology and Oncology

## 2024-12-03 ENCOUNTER — Inpatient Hospital Stay

## 2024-12-03 VITALS — BP 131/67 | HR 83 | Temp 97.1°F | Resp 18

## 2024-12-03 DIAGNOSIS — D751 Secondary polycythemia: Secondary | ICD-10-CM

## 2024-12-03 DIAGNOSIS — Z79899 Other long term (current) drug therapy: Secondary | ICD-10-CM | POA: Insufficient documentation

## 2024-12-03 LAB — CBC WITH DIFFERENTIAL (CANCER CENTER ONLY)
Abs Immature Granulocytes: 0.02 K/uL (ref 0.00–0.07)
Basophils Absolute: 0.1 K/uL (ref 0.0–0.1)
Basophils Relative: 1 %
Eosinophils Absolute: 0.1 K/uL (ref 0.0–0.5)
Eosinophils Relative: 2 %
HCT: 45.8 % (ref 36.0–46.0)
Hemoglobin: 15.2 g/dL — ABNORMAL HIGH (ref 12.0–15.0)
Immature Granulocytes: 0 %
Lymphocytes Relative: 18 %
Lymphs Abs: 1.3 K/uL (ref 0.7–4.0)
MCH: 26.6 pg (ref 26.0–34.0)
MCHC: 33.2 g/dL (ref 30.0–36.0)
MCV: 80.2 fL (ref 80.0–100.0)
Monocytes Absolute: 0.5 K/uL (ref 0.1–1.0)
Monocytes Relative: 7 %
Neutro Abs: 5 K/uL (ref 1.7–7.7)
Neutrophils Relative %: 72 %
Platelet Count: 193 K/uL (ref 150–400)
RBC: 5.71 MIL/uL — ABNORMAL HIGH (ref 3.87–5.11)
RDW: 15.4 % (ref 11.5–15.5)
WBC Count: 7 K/uL (ref 4.0–10.5)
nRBC: 0 % (ref 0.0–0.2)

## 2024-12-03 LAB — CMP (CANCER CENTER ONLY)
ALT: 54 U/L — ABNORMAL HIGH (ref 0–44)
AST: 36 U/L (ref 15–41)
Albumin: 4.4 g/dL (ref 3.5–5.0)
Alkaline Phosphatase: 129 U/L — ABNORMAL HIGH (ref 38–126)
Anion gap: 13 (ref 5–15)
BUN: 14 mg/dL (ref 8–23)
CO2: 23 mmol/L (ref 22–32)
Calcium: 9.7 mg/dL (ref 8.9–10.3)
Chloride: 103 mmol/L (ref 98–111)
Creatinine: 1.1 mg/dL — ABNORMAL HIGH (ref 0.44–1.00)
GFR, Estimated: 55 mL/min — ABNORMAL LOW
Glucose, Bld: 151 mg/dL — ABNORMAL HIGH (ref 70–99)
Potassium: 4 mmol/L (ref 3.5–5.1)
Sodium: 140 mmol/L (ref 135–145)
Total Bilirubin: 0.4 mg/dL (ref 0.0–1.2)
Total Protein: 7.3 g/dL (ref 6.5–8.1)

## 2024-12-03 NOTE — Progress Notes (Signed)
 Cassandra Reid presents today for phlebotomy per MD orders. Phlebotomy procedure started at 1558 and ended at 1606. 572 grams removed. 20G IV used to R-AC. Patient observed for 30 minutes after procedure without any incident. Patient tolerated procedure well. Food and beverage provided. IV needle removed intact. VSS at discharge

## 2024-12-03 NOTE — Patient Instructions (Signed)

## 2024-12-04 ENCOUNTER — Telehealth: Payer: Self-pay

## 2024-12-04 NOTE — Telephone Encounter (Signed)
 Received VM from [facility] regarding clarification on Fiasp  rx.   They are asking if this is supposed to be for vial or pen.   Advised that patient had requested vial per insurance coverage.   Pharmacist reports that cost for 92 day supply will be $83.27. Pharmacist asked that patient call back to discuss 92 day vs 30 day supply.   Mychart message sent to patient with update.   Chiquita JAYSON English, RN

## 2024-12-12 ENCOUNTER — Encounter: Payer: Self-pay | Admitting: Student

## 2024-12-12 DIAGNOSIS — G894 Chronic pain syndrome: Secondary | ICD-10-CM

## 2024-12-12 DIAGNOSIS — M797 Fibromyalgia: Secondary | ICD-10-CM

## 2024-12-12 DIAGNOSIS — F411 Generalized anxiety disorder: Secondary | ICD-10-CM

## 2024-12-14 ENCOUNTER — Telehealth: Payer: Self-pay | Admitting: Pharmacist

## 2024-12-14 MED ORDER — ROSUVASTATIN CALCIUM 20 MG PO TABS: 20.0000 mg | ORAL_TABLET | Freq: Every day | ORAL | 3 refills | Status: AC

## 2024-12-14 NOTE — Telephone Encounter (Signed)
 This patient is appearing on a report for being at risk of failing the adherence measure for cholesterol (statin) medications this past calendar year.   Medication: rosuvastatin  Last fill date: 03/2024 for 90 day supply  Reviewed medication indication, dosing, and goals of therapy.    Patient contacted, reports taking statin in the past, reports taking ezetimibe  currently.  Wiling to take both rosuvastatin  and ezetimibe ,  New prescription sent to her preferred pharmacy.

## 2024-12-15 NOTE — Telephone Encounter (Signed)
 Reviewed and agree with Dr Rennis plan.

## 2024-12-16 ENCOUNTER — Other Ambulatory Visit: Payer: Self-pay | Admitting: Student

## 2024-12-16 ENCOUNTER — Telehealth (HOSPITAL_BASED_OUTPATIENT_CLINIC_OR_DEPARTMENT_OTHER): Payer: Self-pay | Admitting: Pulmonary Disease

## 2024-12-16 ENCOUNTER — Encounter (HOSPITAL_BASED_OUTPATIENT_CLINIC_OR_DEPARTMENT_OTHER): Payer: Self-pay | Admitting: Pulmonary Disease

## 2024-12-16 DIAGNOSIS — N183 Chronic kidney disease, stage 3 unspecified: Secondary | ICD-10-CM

## 2024-12-16 MED ORDER — GABAPENTIN 300 MG PO CAPS: 300.0000 mg | ORAL_CAPSULE | Freq: Three times a day (TID) | ORAL | 0 refills | Status: AC

## 2024-12-16 MED ORDER — GABAPENTIN 300 MG PO CAPS: 300.0000 mg | ORAL_CAPSULE | Freq: Three times a day (TID) | ORAL | 0 refills | Status: DC

## 2025-03-03 ENCOUNTER — Inpatient Hospital Stay: Attending: Hematology and Oncology

## 2025-03-03 ENCOUNTER — Inpatient Hospital Stay

## 2025-06-02 ENCOUNTER — Inpatient Hospital Stay

## 2025-06-02 ENCOUNTER — Inpatient Hospital Stay: Attending: Hematology and Oncology

## 2025-06-03 ENCOUNTER — Encounter

## 2025-09-02 ENCOUNTER — Inpatient Hospital Stay

## 2025-09-02 ENCOUNTER — Inpatient Hospital Stay: Attending: Hematology and Oncology | Admitting: Hematology and Oncology
